# Patient Record
Sex: Male | Born: 1990 | Race: Black or African American | Hispanic: No | Marital: Single | Smoking: Former smoker
Health system: Southern US, Community
[De-identification: ages and names within clinical notes are randomized; demographics above are authoritative.]

## PROBLEM LIST (undated history)

## (undated) DIAGNOSIS — F329 Major depressive disorder, single episode, unspecified: Secondary | ICD-10-CM

## (undated) DIAGNOSIS — F32A Depression, unspecified: Secondary | ICD-10-CM

## (undated) DIAGNOSIS — F1910 Other psychoactive substance abuse, uncomplicated: Secondary | ICD-10-CM

## (undated) DIAGNOSIS — R519 Headache, unspecified: Secondary | ICD-10-CM

## (undated) DIAGNOSIS — F419 Anxiety disorder, unspecified: Secondary | ICD-10-CM

## (undated) DIAGNOSIS — R51 Headache: Secondary | ICD-10-CM

## (undated) DIAGNOSIS — F319 Bipolar disorder, unspecified: Secondary | ICD-10-CM

## (undated) HISTORY — DX: Bipolar disorder, unspecified: F31.9

## (undated) HISTORY — PX: FEMUR FRACTURE SURGERY: SHX633

## (undated) HISTORY — DX: Anxiety disorder, unspecified: F41.9

---
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
## 2008-02-26 ENCOUNTER — Inpatient Hospital Stay: Payer: Self-pay | Admitting: Psychiatry

## 2009-01-26 ENCOUNTER — Inpatient Hospital Stay: Payer: Self-pay | Admitting: Psychiatry

## 2010-10-08 ENCOUNTER — Inpatient Hospital Stay: Payer: Self-pay | Admitting: Psychiatry

## 2011-01-15 ENCOUNTER — Inpatient Hospital Stay: Payer: Self-pay | Admitting: Psychiatry

## 2011-02-18 ENCOUNTER — Emergency Department: Payer: Self-pay | Admitting: Internal Medicine

## 2013-03-06 ENCOUNTER — Emergency Department: Payer: Self-pay | Admitting: Internal Medicine

## 2013-03-06 LAB — DRUG SCREEN, URINE
Amphetamines, Ur Screen: NEGATIVE (ref ?–1000)
Barbiturates, Ur Screen: NEGATIVE (ref ?–200)
Benzodiazepine, Ur Scrn: NEGATIVE (ref ?–200)
Cannabinoid 50 Ng, Ur ~~LOC~~: POSITIVE (ref ?–50)
Cocaine Metabolite,Ur ~~LOC~~: NEGATIVE (ref ?–300)
MDMA (Ecstasy)Ur Screen: NEGATIVE (ref ?–500)
Methadone, Ur Screen: NEGATIVE (ref ?–300)
Opiate, Ur Screen: NEGATIVE (ref ?–300)
Tricyclic, Ur Screen: NEGATIVE (ref ?–1000)

## 2014-11-23 ENCOUNTER — Encounter: Payer: Self-pay | Admitting: Emergency Medicine

## 2014-11-23 ENCOUNTER — Emergency Department
Admission: EM | Admit: 2014-11-23 | Discharge: 2014-11-24 | Disposition: A | Discharge status: Other Acute Inpatient Hospital | Payer: Self-pay | Attending: Emergency Medicine | Admitting: Emergency Medicine

## 2014-11-23 DIAGNOSIS — Z79899 Other long term (current) drug therapy: Secondary | ICD-10-CM | POA: Insufficient documentation

## 2014-11-23 DIAGNOSIS — F1410 Cocaine abuse, uncomplicated: Secondary | ICD-10-CM | POA: Insufficient documentation

## 2014-11-23 DIAGNOSIS — Z87891 Personal history of nicotine dependence: Secondary | ICD-10-CM | POA: Insufficient documentation

## 2014-11-23 DIAGNOSIS — F332 Major depressive disorder, recurrent severe without psychotic features: Secondary | ICD-10-CM

## 2014-11-23 DIAGNOSIS — F1210 Cannabis abuse, uncomplicated: Secondary | ICD-10-CM | POA: Insufficient documentation

## 2014-11-23 DIAGNOSIS — F1510 Other stimulant abuse, uncomplicated: Secondary | ICD-10-CM | POA: Insufficient documentation

## 2014-11-23 LAB — CBC
HEMATOCRIT: 46.9 % (ref 40.0–52.0)
HEMOGLOBIN: 16.1 g/dL (ref 13.0–18.0)
MCH: 31.7 pg (ref 26.0–34.0)
MCHC: 34.3 g/dL (ref 32.0–36.0)
MCV: 92.4 fL (ref 80.0–100.0)
PLATELETS: 267 10*3/uL (ref 150–440)
RBC: 5.07 MIL/uL (ref 4.40–5.90)
RDW: 13.1 % (ref 11.5–14.5)
WBC: 9.6 10*3/uL (ref 3.8–10.6)

## 2014-11-23 LAB — COMPREHENSIVE METABOLIC PANEL
ALT: 14 U/L — ABNORMAL LOW (ref 17–63)
AST: 22 U/L (ref 15–41)
Albumin: 4.4 g/dL (ref 3.5–5.0)
Alkaline Phosphatase: 58 U/L (ref 38–126)
Anion gap: 12 (ref 5–15)
BUN: 15 mg/dL (ref 6–20)
CO2: 26 mmol/L (ref 22–32)
Calcium: 9.1 mg/dL (ref 8.9–10.3)
Chloride: 94 mmol/L — ABNORMAL LOW (ref 101–111)
Creatinine, Ser: 1.3 mg/dL — ABNORMAL HIGH (ref 0.61–1.24)
GFR calc Af Amer: >60 mL/min (ref >60)
GFR calc non Af Amer: >60 mL/min (ref >60)
Glucose, Bld: 113 mg/dL — ABNORMAL HIGH (ref 65–99)
Potassium: 3.9 mmol/L (ref 3.5–5.1)
SODIUM: 132 mmol/L — AB (ref 135–145)
Total Bilirubin: 1.1 mg/dL (ref 0.3–1.2)
Total Protein: 7.5 g/dL (ref 6.5–8.1)

## 2014-11-23 LAB — URINE DRUG SCREEN, QUALITATIVE (ARMC ONLY)
AMPHETAMINES, UR SCREEN: POSITIVE — AB
BENZODIAZEPINE, UR SCRN: NOT DETECTED
Barbiturates, Ur Screen: NOT DETECTED
Cannabinoid 50 Ng, Ur ~~LOC~~: POSITIVE — AB
Cocaine Metabolite,Ur ~~LOC~~: POSITIVE — AB
MDMA (Ecstasy)Ur Screen: NOT DETECTED
METHADONE SCREEN, URINE: NOT DETECTED
OPIATE, UR SCREEN: NOT DETECTED
PHENCYCLIDINE (PCP) UR S: NOT DETECTED
Tricyclic, Ur Screen: NOT DETECTED

## 2014-11-23 LAB — ACETAMINOPHEN LEVEL: Acetaminophen (Tylenol), Serum: <10 ug/mL — ABNORMAL LOW (ref 10–30)

## 2014-11-23 LAB — SALICYLATE LEVEL

## 2014-11-23 LAB — ETHANOL

## 2014-11-23 NOTE — ED Notes (Signed)
Pt brought into ED BHU via sally port accompanie by [employer] Officer and myself. Pt wanded with metal detector for safety by [employer] Officer Dixon. Patient oriented to unit/care area: Pt informed of unit policies and procedures.  Informed that, for their safety, care areas are designed for safety and monitored by security cameras at all times; and visiting hours explained to patient. Patient verbalizes understanding, and verbal contract for safety obtained.Pt shown to their room 3.  Patient assigned to appropriate care area. Patient oriented to unit/care area: Informed that, for their safety, care areas are designed for safety and monitored by security cameras at all times; and visiting hours explained to patient. Patient verbalizes understanding, and verbal contract for safety obtained.  BEHAVIORAL HEALTH ROUNDING Patient sleeping: No. Patient alert and oriented: yes Behavior appropriate: Yes.   Nutrition and fluids offered: Yes  Toileting and hygiene offered: Yes  Sitter present: q15 min observations and security camera monitoring Law enforcement present: Yes [employer]  ENVIRONMENTAL ASSESSMENT Potentially harmful objects out of patient reach: Yes.   Personal belongings secured: Yes.   Patient dressed in hospital provided attire only: Yes.   Plastic bags out of patient reach: Yes.   Patient care equipment removed: Yes.   Equipment and supplies removed: Yes.   Potentially toxic materials out of patient reach: Yes.   Sharps container removed or out of patient reach: Yes.    ED BHU PLACEMENT JUSTIFICATION Is the patient under IVC or is there intent for IVC: Yes.    Is IVC current? Yes Is the patient medically cleared: Yes.   Is there vacancy in the ED BHU: Yes.   Is the population mix appropriate for patient: Yes.   Is the patient awaiting placement in inpatient or outpatient setting: unknown at this time.   Has the patient had a psychiatric consult: No.   Survey of unit performed for  contraband, proper placement and condition of furniture, tampering with fixtures in bathroom, shower, and each patient room: Yes.  ; Findings: none APPEARANCE/BEHAVIOR Cooperative,calm NEURO ASSESSMENT Orientation: A&O x4 Hallucinations: none noted at this time Speech: Normal Gait: normal RESPIRATORY ASSESSMENT Breathing Pattern-regular, no respiratory distress noted CARDIOVASCULAR ASSESSMENT Skin color appropriate for age and race GASTROINTESTINAL ASSESSMENT no GI distress noted EXTREMITIES Moves all extremities, no distress noted PLAN OF CARE Provide calm/safe environment. Vital signs assessed twice daily. ED BHU Assessment once each 12-hour shift. Collaborate with intake RN daily or as condition indicates. Assure the ED provider has rounded once each shift. Provide and encourage hygiene. Provide redirection as needed. Assess for escalating behavior; address immediately and inform ED provider.  Assess family dynamic and appropriateness for visitation as needed: Yes.  Educate the patient/family about BHU procedures/visitation: Yes.

## 2014-11-23 NOTE — BH Assessment (Signed)
Assessment Note  Roger [name] is an 24 y.o. male. Roger [name] reports that the family or the police brought him to the ED.  He states that he does not know why he is here.  He stated "I gave my opinion and they did not like what I was talking about". He went on to say "I'm done".  Roger [name] first denied depression, then stated "I'm definitely depressed. I have constant negative self talk".  He denied symptoms of anxiety.  He denied having auditory or visual hallucinations.  He denied homicidal ideation or intent.  He reports that he has had suicidal ideation, and was thinking about killing himself last night, but he did not have a play.  Roger [name] reports that he uses alcohol, marijuana, cocaine, mushrooms, ecstasy, Molly, and pills (he describes the pills as Xanax, klonipin, Percocet, or whatever pills he can get his hands on). His UDS reports positive for Amphetamines, Cocaine, and Cannabinoids.  His current BAC is <5.   Axis I: Major Depression, Recurrent severe and Substance Abuse Axis II: Deferred Axis III: History reviewed. No pertinent past medical history. Axis IV: other psychosocial or environmental problems and problems with primary support group Axis V: 11-20 some danger of hurting self or others possible OR occasionally fails to maintain minimal personal hygiene OR gross impairment in communication  Past Medical History: History reviewed. No pertinent past medical history.  History reviewed. No pertinent past surgical history.  Family History: History reviewed. No pertinent family history.  Social History:  reports that he has been smoking.  He does not have any smokeless tobacco history on file. He reports that he drinks alcohol. He reports that he uses illicit drugs.  Additional Social History:  Alcohol / Drug Use History of alcohol / drug use?: Yes Longest period of sobriety (when/how long): He reports that he never made an attempt to stop using Negative Consequences of Use:  Personal relationships Substance #1 Name of Substance 1: Alcohol 1 - Age of First Use: 13 1 - Amount (size/oz): a fifth a day 1 - Frequency: daily 1 - Last Use / Amount: 11/22/2014 Substance #2 Name of Substance 2: Marijuana 2 - Age of First Use: 14 2 - Amount (size/oz): "However much I can get" 2 - Frequency: daily 2 - Last Use / Amount: 11/22/2014 Substance #3 Name of Substance 3: Cocaine 3 - Age of First Use: 16 3 - Amount (size/oz): 1 gram 3 - Frequency: daily 3 - Last Use / Amount: 11/21/2014 Substance #4 Name of Substance 4: Ecstacy 4 - Age of First Use: 16 4 - Amount (size/oz): 1 pill 4 - Frequency: daily 4 - Last Use / Amount: 11/22/2014 Substance #5 Name of Substance 5: Mollies 5 - Age of First Use: 24 5 - Amount (size/oz): 1 gram 5 - Frequency: daily 5 - Last Use / Amount: 11/21/2014 Substance #6 Name of Substance 6: Pills (percocets, xanax, klonipin, or whatever pills he can get) 6 - Age of First Use: 16 6 - Amount (size/oz): 1-2 of each pill 6 - Frequency: daily 6 - Last Use / Amount: 11/21/2014 Substance #7 Name of Substance 7: Mushrooms 7 - Age of First Use: 16 7 - Amount (size/oz): 1 mushroom 7 - Frequency: daily/when I can get them 7 - Last Use / Amount: 11/06/2014  CIWA: CIWA-Ar BP: 119/73 mmHg Pulse Rate: (!) 52 COWS:    Allergies: No Known Allergies  Home Medications:  (Not in a hospital admission)  OB/GYN Status:  No LMP for male patient.  General Assessment Data Location of Assessment: [facility] ED TTS Assessment: In system Is this a Tele or Face-to-Face Assessment?: Face-to-Face Is this an Initial Assessment or a Re-assessment for this encounter?: Initial Assessment Marital status: Single Maiden name: n/a Is patient pregnant?: No Pregnancy Status: No Living Arrangements: Parent, Other relatives (Grand-mother) Can pt return to current living arrangement?: Yes Admission Status: Involuntary Is patient capable of signing voluntary admission?:  Yes Referral Source: Psychiatrist Insurance type: None reported  Medical Screening Exam [facility] Walk-in ONLY) Medical Exam completed: Yes  Crisis Care Plan Living Arrangements: Parent, Other relatives (Grand-mother) Name of Psychiatrist: [facility] Name of Therapist: None reported  Education Status Is patient currently in school?: No Current Grade: n/a Highest grade of school patient has completed: 12th Name of school: [employer] person: n/a  Risk to self with the past 6 months Suicidal Ideation: No-Not Currently/Within Last 6 Months Has patient been a risk to self within the past 6 months prior to admission? : Yes Suicidal Intent: No-Not Currently/Within Last 6 Months ("I want to die, I'm ready") Has patient had any suicidal intent within the past 6 months prior to admission? : Yes Is patient at risk for suicide?: Yes Suicidal Plan?: No-Not Currently/Within Last 6 Months Has patient had any suicidal plan within the past 6 months prior to admission? : Yes Access to Means: No (Unknown) What has been your use of drugs/alcohol within the last 12 months?: Daily use of alcohol and drugs Previous Attempts/Gestures: Yes How many times?: 1 Other Self Harm Risks: denied Triggers for Past Attempts: Family contact ("People ask me questions and don't want an honest answer".) Intentional Self Injurious Behavior: None Family Suicide History: Yes (Cousin jumped in front of a train last year) Recent stressful life event(s): Other (Comment) ("Life in general", "Getting out on my own") Persecutory voices/beliefs?: No Depression: Yes Depression Symptoms: Feeling worthless/self pity Substance abuse history and/or treatment for substance abuse?: Yes Suicide prevention information given to non-admitted patients: Not applicable  Risk to Others within the past 6 months Homicidal Ideation: No Does patient have any lifetime risk of violence toward others beyond the six months prior to  admission? : No Thoughts of Harm to Others: No Current Homicidal Intent: No Current Homicidal Plan: No Access to Homicidal Means: No Identified Victim: None reported History of harm to others?: No Assessment of Violence: On admission Violent Behavior Description: None reported Does patient have access to weapons?: No Criminal Charges Pending?: No Does patient have a court date: No Is patient on probation?: No  Psychosis Hallucinations: None noted Delusions: None noted  Mental Status Report Appearance/Hygiene: In scrubs, Unremarkable Eye Contact: Fair Motor Activity: Unremarkable Speech: Soft, Unremarkable Level of Consciousness: Alert Mood: Depressed Affect: Flat Anxiety Level: Minimal Thought Processes: Circumstantial Judgement: Impaired Orientation: Person, Place, Time, Situation Obsessive Compulsive Thoughts/Behaviors: None     ADLScreening [facility] Assessment Services) Patient's cognitive ability adequate to safely complete daily activities?: Yes Patient able to express need for assistance with ADLs?: Yes Independently performs ADLs?: Yes (appropriate for developmental age)  Prior Inpatient Therapy Prior Inpatient Therapy: Yes Prior Therapy Dates: 2011 Prior Therapy Facilty/Provider(s): [facility] Reason for Treatment: Depression  Prior Outpatient Therapy Prior Outpatient Therapy: No (None reported) Prior Therapy Dates: n/a Prior Therapy Facilty/Provider(s): n/a Reason for Treatment: Suicidal ideation  Does patient have an ACCT team?: No Does patient have Intensive In-House Services?  : No Does patient have [facility] services? : No Does patient have P4CC services?: No  ADL Screening (condition at time of admission) Patient's cognitive ability adequate to safely complete daily activities?: Yes Patient able to express need for assistance with ADLs?: Yes Independently performs ADLs?: Yes (appropriate for developmental age)       Abuse/Neglect Assessment  (Assessment to be complete while patient is alone) Physical Abuse: Denies Verbal Abuse: Denies Sexual Abuse: Denies Exploitation of patient/patient's resources: Denies Self-Neglect: Denies Values / Beliefs Cultural Requests During Hospitalization: None Spiritual Requests During Hospitalization: None        Additional Information 1:1 In Past 12 Months?: No CIRT Risk: No Elopement Risk: No Does patient have medical clearance?: Yes     Disposition:  Disposition Initial Assessment Completed for this Encounter: Yes Disposition of Patient: Other dispositions (To be assessed by the psychiatrist)  On Site Evaluation by:   Reviewed with Physician:    Justice Deeds 11/23/2014 10:21 PM

## 2014-11-23 NOTE — ED Notes (Signed)

## 2014-11-23 NOTE — ED Notes (Signed)
Patient to ED via [employer], patient talkative and reporting that he needs to get clean. Reports that he has been drinking and doing drugs. Patient reports that he has had thoughts of hurting himself in past but none today.

## 2014-11-23 NOTE — ED Notes (Signed)
BEHAVIORAL HEALTH ROUNDING Patient sleeping: No. Patient alert and oriented: yes Behavior appropriate: Yes.  ; If no, describe:  Nutrition and fluids offered: Yes  Toileting and hygiene offered: Yes  Sitter present: yes Law enforcement present: Yes  

## 2014-11-23 NOTE — ED Notes (Signed)
Patient denies suicidal thoughts at this time  

## 2014-11-23 NOTE — ED Notes (Signed)
Patient assigned to appropriate care area. Patient oriented to unit/care area: Informed that, for their safety, care areas are designed for safety and monitored by security cameras at all times; and visiting hours explained to patient. Patient verbalizes understanding, and verbal contract for safety obtained. 

## 2014-11-23 NOTE — ED Notes (Signed)
BEHAVIORAL HEALTH ROUNDING Patient sleeping: No. Patient alert and oriented: yes Behavior appropriate: Yes.  ;  Nutrition and fluids offered: Yes  Toileting and hygiene offered: Yes  Sitter present: yes Law enforcement present: Yes  

## 2014-11-23 NOTE — ED Notes (Signed)
ED BHU PLACEMENT JUSTIFICATION Is the patient under IVC or is there intent for IVC: Yes.   Is the patient medically cleared: No. Is there vacancy in the ED BHU: Yes.   Is the population mix appropriate for patient: Yes.   Is the patient awaiting placement in inpatient or outpatient setting: No. Has the patient had a psychiatric consult: No. Survey of unit performed for contraband, proper placement and condition of furniture, tampering with fixtures in bathroom, shower, and each patient room: Yes.  ; Findings: none APPEARANCE/BEHAVIOR calm, cooperative and adequate rapport can be established NEURO ASSESSMENT Orientation: time, place and person Hallucinations: No.None noted (Hallucinations) Speech: Normal Gait: normal RESPIRATORY ASSESSMENT Normal expansion.  Clear to auscultation.  No rales, rhonchi, or wheezing., No chest wall tenderness., No kyphosis or scoliosis. CARDIOVASCULAR ASSESSMENT regular rate and rhythm, S1, S2 normal, no murmur, click, rub or gallop GASTROINTESTINAL ASSESSMENT soft, nontender, BS WNL, no r/g EXTREMITIES normal strength, tone, and muscle mass, no deformities, no erythema, induration, or nodules, no evidence of joint effusion, ROM of all joints is normal, no evidence of joint instability PLAN OF CARE Provide calm/safe environment. Vital signs assessed twice daily. ED BHU Assessment once each 12-hour shift. Collaborate with intake RN daily or as condition indicates. Assure the ED provider has rounded once each shift. Provide and encourage hygiene. Provide redirection as needed. Assess for escalating behavior; address immediately and inform ED provider.  Assess family dynamic and appropriateness for visitation as needed: Yes.  ; If necessary, describe findings: Pt would like family visitation.   Educate the patient/family about BHU procedures/visitation: Yes.  ; If necessary, describe findings: Pt understand the rules and procedures.

## 2014-11-24 ENCOUNTER — Inpatient Hospital Stay
Admit: 2014-11-24 | Discharge: 2014-11-26 | DRG: 885 | Disposition: A | Discharge status: Home / Self Care | Payer: No Typology Code available for payment source | Attending: Psychiatry | Admitting: Psychiatry

## 2014-11-24 DIAGNOSIS — F332 Major depressive disorder, recurrent severe without psychotic features: Secondary | ICD-10-CM | POA: Diagnosis present

## 2014-11-24 DIAGNOSIS — Z720 Tobacco use: Secondary | ICD-10-CM | POA: Diagnosis not present

## 2014-11-24 DIAGNOSIS — Z9119 Patient's noncompliance with other medical treatment and regimen: Secondary | ICD-10-CM | POA: Diagnosis present

## 2014-11-24 DIAGNOSIS — F1320 Sedative, hypnotic or anxiolytic dependence, uncomplicated: Secondary | ICD-10-CM

## 2014-11-24 DIAGNOSIS — F1120 Opioid dependence, uncomplicated: Secondary | ICD-10-CM | POA: Diagnosis present

## 2014-11-24 DIAGNOSIS — Z811 Family history of alcohol abuse and dependence: Secondary | ICD-10-CM

## 2014-11-24 DIAGNOSIS — F1020 Alcohol dependence, uncomplicated: Secondary | ICD-10-CM | POA: Diagnosis present

## 2014-11-24 DIAGNOSIS — G4700 Insomnia, unspecified: Secondary | ICD-10-CM | POA: Diagnosis present

## 2014-11-24 DIAGNOSIS — Z915 Personal history of self-harm: Secondary | ICD-10-CM

## 2014-11-24 DIAGNOSIS — F17200 Nicotine dependence, unspecified, uncomplicated: Secondary | ICD-10-CM

## 2014-11-24 DIAGNOSIS — R45851 Suicidal ideations: Secondary | ICD-10-CM | POA: Diagnosis present

## 2014-11-24 DIAGNOSIS — F1590 Other stimulant use, unspecified, uncomplicated: Secondary | ICD-10-CM

## 2014-11-24 DIAGNOSIS — F1220 Cannabis dependence, uncomplicated: Secondary | ICD-10-CM | POA: Diagnosis present

## 2014-11-24 DIAGNOSIS — F1490 Cocaine use, unspecified, uncomplicated: Secondary | ICD-10-CM | POA: Diagnosis present

## 2014-11-24 DIAGNOSIS — F1690 Hallucinogen use, unspecified, uncomplicated: Secondary | ICD-10-CM

## 2014-11-24 HISTORY — DX: Headache: R51

## 2014-11-24 HISTORY — DX: Headache, unspecified: R51.9

## 2014-11-24 MED ORDER — MAGNESIUM HYDROXIDE 400 MG/5ML PO SUSP: 30.0000 mL | Freq: Every day | ORAL | Status: DC | PRN

## 2014-11-24 MED ORDER — FLUOXETINE HCL 20 MG PO CAPS
20.0000 mg | ORAL_CAPSULE | Freq: Every day | ORAL | Status: DC
  Administered 2014-11-24 – 2014-11-26 (×3): 20 mg via ORAL
  Filled 2014-11-24 (×3): qty 1

## 2014-11-24 MED ORDER — ACETAMINOPHEN 325 MG PO TABS
650.0000 mg | ORAL_TABLET | Freq: Four times a day (QID) | ORAL | Status: DC | PRN
  Administered 2014-11-24 – 2014-11-25 (×3): 650 mg via ORAL
  Filled 2014-11-24 (×3): qty 2

## 2014-11-24 MED ORDER — ALUM & MAG HYDROXIDE-SIMETH 200-200-20 MG/5ML PO SUSP: 30.0000 mL | ORAL | Status: DC | PRN

## 2014-11-24 NOTE — ED Notes (Signed)
BEHAVIORAL HEALTH ROUNDING Patient sleeping: No. Patient alert and oriented: yes Behavior appropriate: Yes.   Nutrition and fluids offered: Yes  Toileting and hygiene offered: Yes  Sitter present: q15 min observations and security camera monitoring Law enforcement present: Yes [employer] 

## 2014-11-24 NOTE — ED Provider Notes (Signed)
-----------------------------------------   11:09 AM on 11/24/2014 -----------------------------------------   Blood pressure 119/73, pulse 52, temperature 98.4 F (36.9 C), temperature source Oral, resp. rate 18, height  (1.676 m), weight 127 lb (57.607 kg), SpO2 100 %.  The patient had no acute events since last update.  Calm and cooperative at this time.  Disposition is pending per Psychiatry/Behavioral Medicine team recommendations.     Sharman Cheek, MD 11/24/14 [phone]

## 2014-11-24 NOTE — ED Notes (Signed)
BEHAVIORAL HEALTH ROUNDING Patient sleeping: Yes.   Patient alert and oriented: yes Behavior appropriate: Yes.  ; If no, describe:  Nutrition and fluids offered: Yes  Toileting and hygiene offered: Yes  Sitter present: yes Law enforcement present: Yes [employer]  

## 2014-11-24 NOTE — ED Notes (Signed)

## 2014-11-24 NOTE — ED Notes (Signed)
BEHAVIORAL HEALTH ROUNDING Patient sleeping: Yes.   Patient alert and oriented: UTA at this time Behavior appropriate: Yes.  ; If no, describe:  Nutrition and fluids offered: Yes  Toileting and hygiene offered: Yes  Sitter present: yes Law enforcement present: Yes [employer] 

## 2014-11-24 NOTE — ED Notes (Signed)
ED BHU PLACEMENT JUSTIFICATION Is the patient under IVC or is there intent for IVC: Yes.   Is the patient medically cleared: Yes.   Is there vacancy in the ED BHU: Yes.   Is the population mix appropriate for patient: Yes.   Is the patient awaiting placement in inpatient or outpatient setting: Yes.   Has the patient had a psychiatric consult: No. Survey of unit performed for contraband, proper placement and condition of furniture, tampering with fixtures in bathroom, shower, and each patient room: Yes.  ; Findings:  APPEARANCE/BEHAVIOR calm, cooperative and adequate rapport can be established NEURO ASSESSMENT Orientation: time, place and person Hallucinations: No.None noted (Hallucinations) Speech: Normal Gait: normal RESPIRATORY ASSESSMENT Normal expansion.  Clear to auscultation.  No rales, rhonchi, or wheezing. CARDIOVASCULAR ASSESSMENT regular rate and rhythm, S1, S2 normal, no murmur, click, rub or gallop GASTROINTESTINAL ASSESSMENT soft, nontender, BS WNL, no r/g EXTREMITIES normal strength, tone, and muscle mass PLAN OF CARE Provide calm/safe environment. Vital signs assessed twice daily. ED BHU Assessment once each 12-hour shift. Collaborate with intake RN daily or as condition indicates. Assure the ED provider has rounded once each shift. Provide and encourage hygiene. Provide redirection as needed. Assess for escalating behavior; address immediately and inform ED provider.  Assess family dynamic and appropriateness for visitation as needed: Yes.  ; If necessary, describe findings:  Educate the patient/family about BHU procedures/visitation: Yes.  ; If necessary, describe findings:  

## 2014-11-24 NOTE — ED Notes (Signed)
BEHAVIORAL HEALTH ROUNDING Patient sleeping: No. Patient alert and oriented: yes Behavior appropriate: Yes.  ; If no, describe:  Nutrition and fluids offered: Yes Toileting and hygiene offered: Yes  Sitter present: yes Law enforcement present: Yes [employer]  

## 2014-11-24 NOTE — ED Notes (Signed)
BEHAVIORAL HEALTH ROUNDING Patient sleeping: Yes.   Patient alert and oriented: not applicable Behavior appropriate: Yes.  ; If no, describe:  Nutrition and fluids offered: Yes  Toileting and hygiene offered: Yes  Sitter present: yes Law enforcement present: Yes [employer] 

## 2014-11-24 NOTE — BHH Counselor (Addendum)
Pt. Has been given information on Adult Unit General Information for Families, and [facility] folder.

## 2014-11-24 NOTE — Consult Note (Signed)
[place] Psychiatry Consult   Reason for Consult:  Consult for this 24 year old man with a history of depression and substance abuse Referring Physician:  Joni Fears Patient Identification: Roger [name] MRN:  [id number] Principal Diagnosis: Severe recurrent major depression without psychotic features Diagnosis:   Patient Active Problem List   Diagnosis Date Noted  . Severe recurrent major depression without psychotic features [F33.2] 11/24/2014  . Alcohol abuse [F10.10] 11/24/2014  . Cocaine abuse [F14.10] 11/24/2014    Total Time spent with patient: 1 hour  Subjective:   Roger [name] is a 24 y.o. male patient admitted with "they just didn't like my answer" also says that he is "tired of it".  HPI:  Information from patient and the chart. He was center on commitment papers from [place] reporting that he was voicing suicidal ideation with intent. The patient tells me that yesterday he was talking to his aunt and told her that he had thought recently about killing himself. He tells me that he thinks about it fairly frequently. He refuses to disclose a specific plan but he has told other interviewers here in the emergency room that he has thought about shooting himself. Patient is rather evasive with me in trying to pin down exactly what his intentions and plans are. He says the biggest stress in his life is that he has a gambling addiction. He claims that he gambles away all of his money every payday and that's what keeps him upset. Also he says he is drinking liquor every day and using cocaine several times a week and marijuana every day. He also goes on with a long list of other drugs he claims he is abusing. He is not currently getting any outpatient psychiatric treatment. Sleep has been poor. Appetite is been stable. No other new physical complaints.  Past psychiatric history: Patient has had psychiatric hospitalizations in the past for depression and suicidal threats and behavior. Says  he had 1 suicide attempt that he came close to following through on with a hanging in 2011 but none since then. He hasn't been following up with outpatient treatment for years. Santiago Glad remember whether any medications were ever helpful to him. Denies that he's been violent to others. Says that he's been in substance abuse treatment but he is also vague about how long ago that was.  Social history: Currently living with his grandmother. 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 Sounds like he has a somewhat conflicted and strained relationship with much of his family.  Medical history: Denies any significant ongoing medical problems  Family history: Does not report any clear family history of mental illness  Current medications none HPI Elements:   Quality:  Depression with suicidal thoughts. Severity:  Potentially severe and life-threatening. Timing:  Ongoing but sound like it's been worse for the past couple months. Duration:  Still present currently. Context:  Substance abuse and gambling addiction and lack of outpatient treatment.  Past Medical History: History reviewed. No pertinent past medical history. History reviewed. No pertinent past surgical history. Family History: History reviewed. No pertinent family history. Social History:  History  Alcohol Use  . Yes     History  Drug Use  . Yes    History   Social History  . Marital Status: Single    Spouse Name: N/A  . Number of Children: N/A  . Years of Education: N/A   Social History Main Topics  . Smoking status: Current Every Day Smoker  .  Smokeless tobacco: Not on file  . Alcohol Use: Yes  . Drug Use: Yes  . Sexual Activity: Not on file   Other Topics Concern  . None   Social History Narrative  . None   Additional Social History:    History of alcohol / drug use?: Yes Longest period of sobriety (when/how long): He reports that he never made an attempt to stop using Negative Consequences of  Use: Personal relationships Name of Substance 1: Alcohol 1 - Age of First Use: 13 1 - Amount (size/oz): a fifth a day 1 - Frequency: daily 1 - Last Use / Amount: 11/22/2014 Name of Substance 2: Marijuana 2 - Age of First Use: 14 2 - Amount (size/oz): "However much I can get" 2 - Frequency: daily 2 - Last Use / Amount: 11/22/2014 Name of Substance 3: Cocaine 3 - Age of First Use: 16 3 - Amount (size/oz): 1 gram 3 - Frequency: daily 3 - Last Use / Amount: 11/21/2014 Name of Substance 4: Ecstacy 4 - Age of First Use: 16 4 - Amount (size/oz): 1 pill 4 - Frequency: daily 4 - Last Use / Amount: 11/22/2014 Name of Substance 5: Mollies 5 - Age of First Use: 50 5 - Amount (size/oz): 1 gram 5 - Frequency: daily 5 - Last Use / Amount: 11/21/2014 Name of Substance 6: Pills (percocets, xanax, klonipin, or whatever pills he can get) 6 - Age of First Use: 16 6 - Amount (size/oz): 1-2 of each pill 6 - Frequency: daily 6 - Last Use / Amount: 11/21/2014         Allergies:  No Known Allergies  Labs:  Results for orders placed or performed during the hospital encounter of 11/23/14 (from the past 48 hour(s))  Comprehensive metabolic panel     Status: Abnormal   Collection Time: 11/23/14  6:19 PM  Result Value Ref Range   Sodium 132 (L) 135 - 145 mmol/L   Potassium 3.9 3.5 - 5.1 mmol/L   Chloride 94 (L) 101 - 111 mmol/L   CO2 26 22 - 32 mmol/L   Glucose, Bld 113 (H) 65 - 99 mg/dL   BUN 15 6 - 20 mg/dL   Creatinine, Ser 1.30 (H) 0.61 - 1.24 mg/dL   Calcium 9.1 8.9 - 10.3 mg/dL   Total Protein 7.5 6.5 - 8.1 g/dL   Albumin 4.4 3.5 - 5.0 g/dL   AST 22 15 - 41 U/L   ALT 14 (L) 17 - 63 U/L   Alkaline Phosphatase 58 38 - 126 U/L   Total Bilirubin 1.1 0.3 - 1.2 mg/dL   GFR calc non Af Amer >60 >60 mL/min   GFR calc Af Amer >60 >60 mL/min    Comment: (NOTE) The eGFR has been calculated using the CKD EPI equation. This calculation has not been validated in all clinical situations. eGFR's  persistently <60 mL/min signify possible Chronic Kidney Disease.    Anion gap 12 5 - 15  Ethanol (ETOH)     Status: None   Collection Time: 11/23/14  6:19 PM  Result Value Ref Range   Alcohol, Ethyl (B) <5 <5 mg/dL    Comment:        LOWEST DETECTABLE LIMIT FOR SERUM ALCOHOL IS 5 mg/dL FOR MEDICAL PURPOSES ONLY   Salicylate level     Status: None   Collection Time: 11/23/14  6:19 PM  Result Value Ref Range   Salicylate Lvl <4.8 2.8 - 30.0 mg/dL  Acetaminophen level  Status: Abnormal   Collection Time: 11/23/14  6:19 PM  Result Value Ref Range   Acetaminophen (Tylenol), Serum <10 (L) 10 - 30 ug/mL    Comment:        THERAPEUTIC CONCENTRATIONS VARY SIGNIFICANTLY. A RANGE OF 10-30 ug/mL MAY BE AN EFFECTIVE CONCENTRATION FOR MANY PATIENTS. HOWEVER, SOME ARE BEST TREATED AT CONCENTRATIONS OUTSIDE THIS RANGE. ACETAMINOPHEN CONCENTRATIONS >150 ug/mL AT 4 HOURS AFTER INGESTION AND >50 ug/mL AT 12 HOURS AFTER INGESTION ARE OFTEN ASSOCIATED WITH TOXIC REACTIONS.   CBC     Status: None   Collection Time: 11/23/14  6:19 PM  Result Value Ref Range   WBC 9.6 3.8 - 10.6 K/uL   RBC 5.07 4.40 - 5.90 MIL/uL   Hemoglobin 16.1 13.0 - 18.0 g/dL   HCT 46.9 40.0 - 52.0 %   MCV 92.4 80.0 - 100.0 fL   MCH 31.7 26.0 - 34.0 pg   MCHC 34.3 32.0 - 36.0 g/dL   RDW 13.1 11.5 - 14.5 %   Platelets 267 150 - 440 K/uL  Urine Drug Screen, Qualitative ([facility] only)     Status: Abnormal   Collection Time: 11/23/14  6:19 PM  Result Value Ref Range   Tricyclic, Ur Screen NONE DETECTED NONE DETECTED   Amphetamines, Ur Screen POSITIVE (A) NONE DETECTED   MDMA (Ecstasy)Ur Screen NONE DETECTED NONE DETECTED   Cocaine Metabolite,Ur [place] POSITIVE (A) NONE DETECTED   Opiate, Ur Screen NONE DETECTED NONE DETECTED   Phencyclidine (PCP) Ur S NONE DETECTED NONE DETECTED   Cannabinoid 50 Ng, Ur [place] POSITIVE (A) NONE DETECTED   Barbiturates, Ur Screen NONE DETECTED NONE DETECTED   Benzodiazepine, Ur Scrn NONE  DETECTED NONE DETECTED   Methadone Scn, Ur NONE DETECTED NONE DETECTED    Comment: (NOTE) 201  Tricyclics, urine               Cutoff 1000 ng/mL 200  Amphetamines, urine             Cutoff 1000 ng/mL 300  MDMA (Ecstasy), urine           Cutoff 500 ng/mL 400  Cocaine Metabolite, urine       Cutoff 300 ng/mL 500  Opiate, urine                   Cutoff 300 ng/mL 600  Phencyclidine (PCP), urine      Cutoff 25 ng/mL 700  Cannabinoid, urine              Cutoff 50 ng/mL 800  Barbiturates, urine             Cutoff 200 ng/mL 900  Benzodiazepine, urine           Cutoff 200 ng/mL 1000 Methadone, urine                Cutoff 300 ng/mL 1100 1200 The urine drug screen provides only a preliminary, unconfirmed 1300 analytical test result and should not be used for non-medical 1400 purposes. Clinical consideration and professional judgment should 1500 be applied to any positive drug screen result due to possible 1600 interfering substances. A more specific alternate chemical method 1700 must be used in order to obtain a confirmed analytical result.  1800 Gas chromato graphy / mass spectrometry (GC/MS) is the preferred 1900 confirmatory method.     Vitals: Blood pressure 100/58, pulse 54, temperature 98.5 F (36.9 C), temperature source Oral, resp. rate 16, height $RemoveBe'5\' 6"'TkEkPYLYO$  (1.676 m), weight 57.607 kg (  127 lb), SpO2 100 %.  Risk to Self: Suicidal Ideation: No-Not Currently/Within Last 6 Months Suicidal Intent: No-Not Currently/Within Last 6 Months ("I want to die, I'm ready") Is patient at risk for suicide?: Yes Suicidal Plan?: No-Not Currently/Within Last 6 Months Access to Means: No (Unknown) What has been your use of drugs/alcohol within the last 12 months?: Daily use of alcohol and drugs How many times?: 1 Other Self Harm Risks: denied Triggers for Past Attempts: Family contact ("People ask me questions and don't want an honest answer".) Intentional Self Injurious Behavior: None Risk to Others:  Homicidal Ideation: No Thoughts of Harm to Others: No Current Homicidal Intent: No Current Homicidal Plan: No Access to Homicidal Means: No Identified Victim: None reported History of harm to others?: No Assessment of Violence: On admission Violent Behavior Description: None reported Does patient have access to weapons?: No Criminal Charges Pending?: No Does patient have a court date: No Prior Inpatient Therapy: Prior Inpatient Therapy: Yes Prior Therapy Dates: 2011 Prior Therapy Facilty/Provider(s): [facility] Reason for Treatment: Depression Prior Outpatient Therapy: Prior Outpatient Therapy: No (None reported) Prior Therapy Dates: n/a Prior Therapy Facilty/Provider(s): n/a Reason for Treatment: Suicidal ideation  Does patient have an ACCT team?: No Does patient have Intensive In-House Services?  : No Does patient have [facility] services? : No Does patient have P4[facility] services?: No  No current facility-administered medications for this encounter.   No current outpatient prescriptions on file.    Musculoskeletal: Strength & Muscle Tone: within normal limits Gait & Station: normal Patient leans: N/A  Psychiatric Specialty Exam: Physical Exam  Constitutional: He appears well-developed and well-nourished.  HENT:  Head: Normocephalic and atraumatic.  Eyes: Conjunctivae are normal. Pupils are equal, round, and reactive to light.  Neck: Normal range of motion.  Cardiovascular: Normal heart sounds.   Respiratory: Effort normal.  GI: Soft.  Musculoskeletal: Normal range of motion.  Neurological: He is alert.  Skin: Skin is warm and dry.  Psychiatric: His speech is delayed. He is slowed. Cognition and memory are normal. He expresses impulsivity. He exhibits a depressed mood. He expresses suicidal ideation.    Review of Systems  Constitutional: Negative.   HENT: Negative.   Eyes: Negative.   Respiratory: Negative.   Cardiovascular: Negative.   Gastrointestinal: Negative.    Musculoskeletal: Negative.   Skin: Negative.   Neurological: Negative.   Psychiatric/Behavioral: Positive for depression, suicidal ideas and substance abuse. Negative for hallucinations. The patient is nervous/anxious and has insomnia.     Blood pressure 100/58, pulse 54, temperature 98.5 F (36.9 C), temperature source Oral, resp. rate 16, height $RemoveBe'5\' 6"'RcLqDBGaj$  (1.676 m), weight 57.607 kg (127 lb), SpO2 100 %.Body mass index is 20.51 kg/(m^2).  General Appearance: Guarded  Eye Contact::  Minimal  Speech:  Normal Rate  Volume:  Decreased  Mood:  Depressed  Affect:  Depressed  Thought Process:  Tangential  Orientation:  Full (Time, Place, and Person)  Thought Content:  Negative  Suicidal Thoughts:  Yes.  with intent/plan  Homicidal Thoughts:  No  Memory:  Immediate;   Good Recent;   Fair Remote;   Fair  Judgement:  Impaired  Insight:  Lacking  Psychomotor Activity:  Decreased  Concentration:  Fair  Recall:  Poor  Fund of Knowledge:Fair  Language: Fair  Akathisia:  No  Handed:  Right  AIMS (if indicated):     Assets:  Communication Skills Desire for Improvement Housing Physical Health  ADL's:  Intact  Cognition: WNL  Sleep:  Medical Decision Making: Review of Psycho-Social Stressors (1), Review or order clinical lab tests (1), Established Problem, Worsening (2), Review or order medicine tests (1) and Review of New Medication or Change in Dosage (2)  Treatment Plan Summary: Daily contact with patient to assess and evaluate symptoms and progress in treatment, Medication management and Plan Patient really can't calm up with anything like a "contract for safety" and is very vague and evasive about his suicidal thoughts. Under the circumstances with his past history I suggest we admit him to the hospital. He does have a drug screen positive for amphetamine's cocaine and marijuana although his alcohol level was negative. He doesn't appear to be having any overt signs of alcohol  withdrawal. Patient will be restarted on antidepressive medication and put on 15 minute checks in primary treatment team can work on referral to appropriate outpatient treatment once he is stable.  Plan:  Recommend psychiatric Inpatient admission when medically cleared. Supportive therapy provided about ongoing stressors. Disposition: Admit to psychiatry as noted above  Alethia Berthold 11/24/2014 4:05 PM

## 2014-11-24 NOTE — ED Notes (Signed)
BEHAVIORAL HEALTH ROUNDING Patient sleeping: Yes.   Patient alert and oriented: not applicable Behavior appropriate: Yes.    Nutrition and fluids offered: No Toileting and hygiene offered: No Sitter present: q15 minute observations and security camera monitoring Law enforcement present: Yes [employer] 

## 2014-11-24 NOTE — ED Notes (Signed)
Report received from [facility]. Pt. Alert and oriented in no distress denies SI, HI, AVH and pain.  Pt. Instructed to come to me with problems or concerns.Will continue to monitor for safety via security cameras and Q 15 minute checks.

## 2014-11-24 NOTE — Tx Team (Signed)
Initial Interdisciplinary Treatment Plan   PATIENT STRESSORS: Marital or family conflict Substance abuse   PATIENT STRENGTHS: Ability for insight Capable of independent living Communication skills   PROBLEM LIST: Problem List/Patient Goals Date to be addressed Date deferred Reason deferred Estimated date of resolution  depression 11/24/2014     Substance abuse 11/24/2014                                                DISCHARGE CRITERIA:  Ability to meet basic life and health needs Adequate post-discharge living arrangements Improved stabilization in mood, thinking, and/or behavior Verbal commitment to aftercare and medication compliance  PRELIMINARY DISCHARGE PLAN: Outpatient therapy Placement in alternative living arrangements  PATIENT/FAMIILY INVOLVEMENT: This treatment plan has been presented to and reviewed with the patient, GARON MELANDER, and/or family member.  The patient and family have been given the opportunity to ask questions and make suggestions.  Foster Simpson 11/24/2014, 10:58 PM

## 2014-11-24 NOTE — ED Provider Notes (Signed)
-----------------------------------------   5:38 PM on 11/24/2014 -----------------------------------------   Blood pressure 100/58, pulse 54, temperature 98.5 F (36.9 C), temperature source Oral, resp. rate 16, height  (1.676 m), weight 127 lb (57.607 kg), SpO2 100 %.  Discussed with Dr. Toni Amend. Patient being admitted to Dr. Toni Amend psychiatry service.     Sharyn Creamer, MD 11/24/14 [phone]

## 2014-11-24 NOTE — ED Notes (Signed)
Pt. Noted in room. No complaints or concerns voiced. No distress or abnormal behavior noted. Will continue to monitor with security cameras. Q 15 minute rounds continue. Sandwich and soft drink given. 

## 2014-11-25 ENCOUNTER — Encounter: Payer: Self-pay | Admitting: Psychiatry

## 2014-11-25 DIAGNOSIS — F1320 Sedative, hypnotic or anxiolytic dependence, uncomplicated: Secondary | ICD-10-CM

## 2014-11-25 DIAGNOSIS — F1590 Other stimulant use, unspecified, uncomplicated: Secondary | ICD-10-CM

## 2014-11-25 DIAGNOSIS — F1220 Cannabis dependence, uncomplicated: Secondary | ICD-10-CM

## 2014-11-25 DIAGNOSIS — F1690 Hallucinogen use, unspecified, uncomplicated: Secondary | ICD-10-CM

## 2014-11-25 DIAGNOSIS — F17200 Nicotine dependence, unspecified, uncomplicated: Secondary | ICD-10-CM

## 2014-11-25 DIAGNOSIS — F1120 Opioid dependence, uncomplicated: Secondary | ICD-10-CM

## 2014-11-25 DIAGNOSIS — F332 Major depressive disorder, recurrent severe without psychotic features: Principal | ICD-10-CM

## 2014-11-25 DIAGNOSIS — F1020 Alcohol dependence, uncomplicated: Secondary | ICD-10-CM

## 2014-11-25 LAB — BASIC METABOLIC PANEL
Anion gap: 10 (ref 5–15)
BUN: 15 mg/dL (ref 6–20)
CALCIUM: 9.3 mg/dL (ref 8.9–10.3)
CO2: 29 mmol/L (ref 22–32)
CREATININE: 1.2 mg/dL (ref 0.61–1.24)
Chloride: 97 mmol/L — ABNORMAL LOW (ref 101–111)
GFR calc Af Amer: >60 mL/min (ref >60)
Glucose, Bld: 102 mg/dL — ABNORMAL HIGH (ref 65–99)
Potassium: 4.3 mmol/L (ref 3.5–5.1)
SODIUM: 136 mmol/L (ref 135–145)

## 2014-11-25 LAB — VITAMIN B12: Vitamin B-12: 228 pg/mL (ref 180–914)

## 2014-11-25 LAB — TSH: TSH: 2.269 u[IU]/mL (ref 0.350–4.500)

## 2014-11-25 MED ORDER — IBUPROFEN 600 MG PO TABS
600.0000 mg | ORAL_TABLET | Freq: Four times a day (QID) | ORAL | Status: DC | PRN
  Administered 2014-11-26: 600 mg via ORAL
  Filled 2014-11-25: qty 1

## 2014-11-25 MED ORDER — FLUOXETINE HCL 20 MG PO CAPS: 20.0000 mg | ORAL_CAPSULE | Freq: Every day | ORAL | Status: DC

## 2014-11-25 MED ORDER — NICOTINE 21 MG/24HR TD PT24
21.0000 mg | MEDICATED_PATCH | Freq: Every day | TRANSDERMAL | Status: DC
  Administered 2014-11-25 – 2014-11-26 (×2): 21 mg via TRANSDERMAL
  Filled 2014-11-25 (×2): qty 1

## 2014-11-25 MED ORDER — TRAZODONE HCL 100 MG PO TABS: 100.0000 mg | ORAL_TABLET | Freq: Every evening | ORAL | Status: DC | PRN

## 2014-11-25 MED ORDER — TRAZODONE HCL 100 MG PO TABS
100.0000 mg | ORAL_TABLET | Freq: Every evening | ORAL | Status: DC | PRN
  Filled 2014-11-25: qty 1

## 2014-11-25 MED ORDER — NICOTINE 10 MG IN INHA: 1.0000 | RESPIRATORY_TRACT | Status: DC | PRN

## 2014-11-25 MED ORDER — LORAZEPAM 2 MG PO TABS
2.0000 mg | ORAL_TABLET | Freq: Three times a day (TID) | ORAL | Status: DC | PRN
  Filled 2014-11-25: qty 1

## 2014-11-25 NOTE — [facility] Suicide Risk Assessment (Signed)
[facility] INPATIENT:  Family/Significant Other Suicide Prevention Education  Suicide Prevention Education:  Education Completed; Ronna Polio (grandmother) [phone] has been identified by the patient as the family member/significant other with whom the patient will be residing, and identified as the person(s) who will aid the patient in the event of a mental health crisis (suicidal ideations/suicide attempt).  With written consent from the patient, the family member/significant other has been provided the following suicide prevention education, prior to the and/or following the discharge of the patient.  The suicide prevention education provided includes the following:  Suicide risk factors  Suicide prevention and interventions  National Suicide Hotline telephone number  [facility] assessment telephone number  [facility] Emergency Assistance 911  [facility] and/or Residential Mobile Crisis Unit telephone number  Request made of family/significant other to:  Remove weapons (e.g., guns, rifles, knives), all items previously/currently identified as safety concern.    Remove drugs/medications (over-the-counter, prescriptions, illicit drugs), all items previously/currently identified as a safety concern.  The family member/significant other verbalizes understanding of the suicide prevention education information provided.  The family member/significant other agrees to remove the items of safety concern listed above.  Lulu Riding, MSW, LCSWA 11/25/2014, 4:16 PM

## 2014-11-25 NOTE — Progress Notes (Signed)
Visible in milieu.  No inappropriate behavior noted.  Safety maintained.
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
## 2014-11-25 NOTE — Progress Notes (Signed)
Recreation Therapy Notes  Date: 08.10.16 Time: 3:00 pm Location: Craft Room  Group Topic: Self-esteem  Goal Area(s) Addresses:  Patient will write down at least one positive trait about self.  Behavioral Response: Attentive, Interactive  Intervention: I Am  Activity: Patients were given a worksheet with the letter I on it and instructed to list as many positive traits as they could inside the I.   Education: LRT educated patients on ways they can increase their self-esteem.  Education Outcome: Acknowledges education/In group clarification offered  Clinical Observations/Feedback: Patient filled approximately 75% of worksheet. Patient contributed to group discussion by stating it was difficult to think of positive traits about himself and it felt good to see positive traits about him listed.  Jacquelynn Cree, LRT/CTRS 11/25/2014 4:29 PM

## 2014-11-25 NOTE — [facility] Group Notes (Signed)
[facility] Group Notes:  (Nursing/MHT/Case Management/Adjunct)  Date:  11/25/2014  Time:  1:46 PM  Type of Therapy:  Psychoeducational Skills  Participation Level:  Active  Participation Quality:  Appropriate, Attentive and Sharing  Affect:  Appropriate  Cognitive:  Appropriate  Insight:  Appropriate  Engagement in Group:  Engaged  Modes of Intervention:  Discussion, Education and Support  Summary of Progress/Problems:  Roger [name] 11/25/2014, 1:46 PM

## 2014-11-25 NOTE — [facility] Counselor (Signed)
Adult Comprehensive Assessment  Patient ID: Roger [name], male   DOB: 08-22-1990, 24 y.o.   MRN: [id number]  Information Source: Information source: Patient  Current Stressors:  Substance abuse: alcohol  Living/Environment/Situation:  Living Arrangements: Other relatives, Parent  Family History:  Does patient have children?: No  Childhood History:  By whom was/is the patient raised?: Father, Grandparents Patient's description of current relationship with people who raised him/her: good but does not know his father Does patient have siblings?: No  Education:  Highest grade of school patient has completed: 12th grade Currently a student?: No Learning disability?: No  Employment/Work Situation:   Employment situation: Employed Has patient ever been in the [employer]?: No Has patient ever served in Buyer, retail?: No  Financial Resources:   Surveyor, quantity resources: Income from employment Does patient have a Lawyer or guardian?: No  Alcohol/Substance Abuse:      Social Support System:   Conservation officer, nature Support System: Production assistant, radio System: family and community mental health  Leisure/Recreation:      Strengths/Needs:      Discharge Plan:   Does patient have access to transportation?: Yes Will patient be returning to same living situation after discharge?: Yes Currently receiving community mental health services: No If no, would patient like referral for services when discharged?: Yes (What [place] Does patient have financial barriers related to discharge medications?: Yes Patient description of barriers related to discharge medications: no insureance and is given Med Manangeetn Appplication  Summary/Recommendations:  Patient is a single 24yo AA male admitted for SI and substance use. Patient lives with his grandmother and father recently moved in. Patient is looking for substance use treatment and is referred to [facility] for SAOP and  [facility] for assistance with obtaining medicaitons. Patient is employed and will be given resources for long term treatment that may take time to get admitted and will need to be placed on a wait list. Patient is encouraged to participate in medication management, group therapy and therapeutic milieu.    Lulu Riding., MSW, Theresia Majors  11/25/2014

## 2014-11-25 NOTE — Progress Notes (Signed)
Recreation Therapy Notes  INPATIENT RECREATION THERAPY ASSESSMENT  Patient Details Name: ARLESTER KEEHAN MRN: [id number] DOB: 29-Jul-1990 Today's Date: 11/25/2014  Patient Stressors: Work, Other (Comment) (Gambling)  Coping Skills:   Isolate, Substance Abuse, Avoidance, Exercise, Art/Dance, Talking, Music, Sports, Other (Comment) Librarian, academic)  Personal Challenges: Communication, Decision-Making, Expressing Yourself, Problem-Solving, Self-Esteem/Confidence, Stress Management, Substance Abuse, Trusting Others  Leisure Interests (2+):  Nature - Therapist, music, Individual - Other (Comment) (Being outside)  Awareness of Community Resources:  Yes  Community Resources:  Park, Other (Comment) [facility])  Current Use: Yes  If no, Barriers?:    Patient Strengths:  Easy to get along with, not jealous  Patient Identified Areas of Improvement:  Social skills  Current Recreation Participation:  Fishing  Patient Goal for Hospitalization:  To get out  [place] of Residence:  [place] of Residence:  [place]   Current SI (including self-harm):  No  Current HI:  No  Consent to Intern Participation: N/A   Jacquelynn Cree, LRT/CTRS 11/25/2014, 2:25 PM

## 2014-11-25 NOTE — [facility] Group Notes (Signed)
[facility] LCSW Aftercare Discharge Planning Group Note   11/25/2014 10:56 AM  Participation Quality:  Patient particiapted in group appropriately sharing his SMART goal is to "see a doctor, interact with people and share my thoughts to get an idea of how I am thinking".   Mood/Affect:  Appropriate    Thoughts of Suicide:  No Will you contract for safety?   Yes  Current AVH:  No  Plan for Discharge/Comments:  CSW will assess  Transportation Means: CSW will assess  Supports: CSW will assess  Lulu Riding, MSW, LCSGrove City Surgery Center LLCWA

## 2014-11-25 NOTE — [facility] Suicide Risk Assessment (Signed)
[name] Admission Suicide Risk Assessment   Nursing information obtained from:    Demographic factors:    Current Mental Status:    Loss Factors:    Historical Factors:    Risk Reduction Factors:    Total Time spent with patient: 1 hour Principal Problem: Severe recurrent major depression without psychotic features Diagnosis:   Patient Active Problem List   Diagnosis Date Noted  . Tobacco use disorder [Z72.0] 11/25/2014  . Alcohol use disorder, severe, dependence [F10.20] 11/25/2014  . Cannabis use disorder, severe, dependence [F12.20] 11/25/2014  . 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 Opioid use disorder, moderate, dependence [F11.20] 11/25/2014  . 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 Severe recurrent major depression without psychotic features [F33.2] 11/24/2014     Continued Clinical Symptoms:  Alcohol Use Disorder Identification Test Final Score (AUDIT): 14 The "Alcohol Use Disorders Identification Test", Guidelines for Use in Primary Care, Second Edition.  World Science writer [facility]). Score between 0-7:  no or low risk or alcohol related problems. Score between 8-15:  moderate risk of alcohol related problems. Score between 16-19:  high risk of alcohol related problems. Score 20 or above:  warrants further diagnostic evaluation for alcohol dependence and treatment.   CLINICAL FACTORS:   Depression:   Comorbid alcohol abuse/dependence Impulsivity Alcohol/Substance Abuse/Dependencies More than one psychiatric diagnosis Previous Psychiatric Diagnoses and Treatments   Psychiatric Specialty Exam: Physical Exam  ROS    COGNITIVE FEATURES THAT CONTRIBUTE TO RISK:  Closed-mindedness    SUICIDE RISK:   Moderate:  Frequent suicidal ideation with limited intensity, and duration, some specificity in terms of plans, no associated intent, good  self-control, limited dysphoria/symptomatology, some risk factors present, and identifiable protective factors, including available and accessible social support.  PLAN OF CARE: admit to [facility]  Medical Decision Making:  Established Problem, Worsening (2)  I certify that inpatient services furnished can reasonably be expected to improve the patient's condition.   Roger [name] 11/25/2014, 2:05 PM
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
## 2014-11-25 NOTE — Plan of Care (Signed)
Problem: Spiritual Needs Goal: Ability to function at adequate level Outcome: Progressing Pleasant and cooperative.  Interacting appropriately on the unit.  No insight as to why he is here. Feels that should not be here.

## 2014-11-25 NOTE — [facility] Group Notes (Addendum)
[facility] LCSW Group Therapy  11/25/2014 2:56 PM  Type of Therapy:  Group Therapy  Participation Level:  Active  Participation Quality:  Attentive  Affect:  Appropriate  Cognitive:  Alert  Insight:  Limited  Engagement in Therapy:  Limited  Modes of Intervention:  Discussion, Education, Socialization and Support  Summary of Progress/Problems:Emotional Regulation: Patients will identify both negative and positive emotions. They will discuss emotions they have difficulty regulating and how they impact their lives. Patients will be asked to identify healthy coping skills to combat unhealthy reactions to negative emotions.   Roger [name] attended group and stayed the entire time. He identified anger as an emotion he has difficulties regulating. When he becomes angry he tends to destroy property and throw objects. He states small problems make him very angry. When he becomes angry he tries to meditate, walking or exercising as a way to calm down.   Roger [name] L Enora Trillo MSW, LCSWA  11/25/2014, 2:56 PM

## 2014-11-26 NOTE — Plan of Care (Signed)
Problem: [facility] Participation in Recreation Therapeutic Interventions Goal: STG-Patient will demonstrate improved self esteem by identif STG: Self-Esteem - Within 3 treatment sessions, patient will verbalize at least 5 positive affirmation statements in one treatment session to increase self-esteem post d/c.  Outcome: Completed/Met Date Met:  11/26/14 Treatment Session 1; Completed 1 out of 1: At approximately 8:55 am, LRT met with patient in craft room. Patient verbalized 5 positive affirmation statements. Patient reported it felt "good". LRT encouraged patient to continue saying positive affirmation statements.   Leonette Monarch, LRT/CTRS 08.11.16 12:22 pm Goal: STG-Patient will identify at least five coping skills for ** STG: Coping Skills - Within 3 treatment sessions, patient will verbalize at least 5 coping skills for substance abuse in one treatment session to decrease substance abuse post d/c.  Outcome: Completed/Met Date Met:  11/26/14 Treatment Session 1; Completed 1 out of 1: At approximately 8:55 am, LRT met with patient in craft room. Patient verbalized 5 coping skills for substance abuse. LRT educated patient on leisure and why it is important to implement into his schedule. LRT provided patient with blank schedules to help him plan his day and try to avoid using substances. LRT educated patient on healthy support systems.  Leonette Monarch, LRT/CTRS 08.11.16 12:24 pm Goal: STG-Patient will demonstrate improved communication skills b STG: Communication - Within 3 treatment sessions, patient will participate in communication activity in one treatment session to increase healthy communication skills post d/c.  Outcome: Completed/Met Date Met:  11/26/14 Treatment Session 1; Completed 1 out of 1: At approximately 8:55 am, LRT met with patient in craft room. Patient was instructed to take as much toilet paper as he would use in 6 hours. For each square, patient was instructed to give a  fun fact about himself. Patient able to give a fun fact for each square. LRT educated patient on communication and trust. LRT and patient did role playing to help patient feel better about his communication skills.  Leonette Monarch, LRT/CTRS 08.11.16 12:27 pm Goal: STG-Other Recreation Therapy Goal (Specify) STG: Stress Management - Within 3 treatment sessions, patient will verbalize understanding of the stress management techniques in one treatment session to increase stress management skills post d/c.  Outcome: Completed/Met Date Met:  11/26/14 Treatment Session 1; Completed 1 out of 1: At approximately 8:55 am, LRT met with patient in craft room. LRT educated and provided patient with handouts on stress management techniques. Patient verbalized understanding. LRT encouraged patient to read over and practice the stress management techniques.  Leonette Monarch, LRT/CTRS 08.11.16 12:30 pm

## 2014-11-26 NOTE — [facility] Group Notes (Signed)
[facility] LCSW Group Therapy  11/26/2014 12:29 PM  Type of Therapy: Group Therapy  Participation Level: Did Not Attend  Modes of Intervention: Discussion, Education, Socialization and Support  Summary of Progress/Problems:Balance in life: Patients will discuss the concept of balance and how it looks and feels to be unbalanced. Pt will identify areas in their life that is unbalanced and ways to become more balanced.    Jeff Mccallum L Timmey Lamba MSW, LCSWA  11/26/2014, 12:29 PM 
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
## 2014-11-26 NOTE — Progress Notes (Signed)
AVS H&P Discharge Summary faxed to [facility] for hospital follow-up °

## 2014-11-26 NOTE — [facility] Group Notes (Signed)
[facility] Group Notes:  (Nursing/MHT/Case Management/Adjunct)  Date:  11/26/2014  Time:  8:58 AM  Type of Therapy:  Community Meeting   Participation Level:  Did Not Attend  Summary of Progress/Problems:  Deann Mclaine De'Chelle Waynette Towers 11/26/2014, 8:58 AM

## 2014-11-26 NOTE — [facility] Suicide Risk Assessment (Signed)
[facility] Discharge Suicide Risk Assessment   Demographic Factors:  Male and Adolescent or young adult  Total Time spent with patient: 30 minutes    Psychiatric Specialty Exam: Physical Exam  ROS                                                         Have you used any form of tobacco in the last 30 days? (Cigarettes, Smokeless Tobacco, Cigars, and/or Pipes): Yes  Has this patient used any form of tobacco in the last 30 days? (Cigarettes, Smokeless Tobacco, Cigars, and/or Pipes) Yes, A prescription for an FDA-approved tobacco cessation medication was offered at discharge and the patient refused  Mental Status Per Nursing Assessment::   On Admission:     Current Mental Status by Physician: denies SI, HI or A/VH.  Hopeful, future oriented.  Calm and cooperative  Loss Factors: Financial problems/change in socioeconomic status  Historical Factors: Prior suicide attempts  Risk Reduction Factors:   Sense of responsibility to family, Employed and Positive social support  Continued Clinical Symptoms:  Depression:   Comorbid alcohol abuse/dependence Impulsivity Alcohol/Substance Abuse/Dependencies Previous Psychiatric Diagnoses and Treatments  Cognitive Features That Contribute To Risk:  Closed-mindedness    Suicide Risk:  Minimal: No identifiable suicidal ideation.  Patients presenting with no risk factors but with morbid ruminations; may be classified as minimal risk based on the severity of the depressive symptoms  Principal Problem: Severe recurrent major depression without psychotic features Discharge Diagnoses:  Patient Active Problem List   Diagnosis Date Noted  . Tobacco use disorder [Z72.0] 11/25/2014  . Alcohol use disorder, severe, dependence [F10.20] 11/25/2014  . Cannabis use disorder, severe, dependence [F12.20] 11/25/2014  . 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 Opioid use disorder, moderate, dependence [F11.20] 11/25/2014  . 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 Severe recurrent major depression without psychotic features [F33.2] 11/24/2014    Follow-up Information    Follow up with [facility].   Why:  For follow-up care   Contact information:   [address]  [place], [place] Ph [phone] Fax [phone]      Is patient on multiple antipsychotic therapies at discharge:  No   Has Patient had three or more failed trials of antipsychotic monotherapy by history:  No  Recommended Plan for Multiple Antipsychotic Therapies: NA    Jimmy Footman 11/26/2014, 7:33 AM

## 2014-11-26 NOTE — Progress Notes (Signed)
  [facility] Adult Case Management Discharge Plan :  Will you be returning to the same living situation after discharge:  Yes,  home with grandmother At discharge, do you have transportation home?: Yes,  grandmother will pick up Do you have the ability to pay for your medications: No. Patient provided Medication Management application and referred  Release of information consent forms completed and in the chart;  Patient's signature needed at discharge.  Patient to Follow up at: Follow-up Information    Follow up with [facility]. Go on 12/02/2014.   Why:  For follow-up care Wednesday 12/02/14 at 7:00am for SAOP   Contact information:   [address]  [place], [place] Ph [phone] Fax [phone]      Patient denies SI/HI: Yes,  patient denies SI/HI    Safety Planning and Suicide Prevention discussed: Yes,  SPE discussed with patient and his grandmother  Have you used any form of tobacco in the last 30 days? (Cigarettes, Smokeless Tobacco, Cigars, and/or Pipes): Yes  Has patient been referred to the Quitline?: Patient refused referral  Lulu Riding, MSW, LCSWA 11/26/2014, 9:58 AM

## 2014-11-26 NOTE — Progress Notes (Signed)
Pleasant and cooperative.  Verbalizes that he is ready to go so he can get back to work.  Further states that his plan is to stop using drugs, they just have to go.  Denies SI, depression or any type of hallucinations.   Discharge instructions given, verbalized understanding. Prescriptions and seven day supply of medications given.  Personal belongings returned.  Escorted off unit by staff member to meet family to travel home.

## 2014-11-26 NOTE — Progress Notes (Signed)
Recreation Therapy Notes  INPATIENT RECREATION TR PLAN  Patient Details Name: Roger [name] MRN: [id number] DOB: 10/20/1990 Today's Date: 11/26/2014  Rec Therapy Plan Is patient appropriate for Therapeutic Recreation?: Yes Treatment times per week: At least 3 times a week TR Treatment/Interventions: 1:1 session, Group participation (Comment) (Appropriate participation in daily recreation therapy tx)  Discharge Criteria Pt will be discharged from therapy if:: Discharged Treatment plan/goals/alternatives discussed and agreed upon by:: Patient/family  Discharge Summary Short term goals set: See Care Plan Short term goals met: Complete Progress toward goals comments: One-to-one attended Which groups?: Self-esteem One-to-one attended: Self-esteem, stress management, coping skills, communication Reason goals not met: N/A Therapeutic equipment acquired: None Reason patient discharged from therapy: Discharge from hospital Pt/family agrees with progress & goals achieved: Yes Date patient discharged from therapy: 11/26/14   Leonette Monarch, LRT/CTRS 11/26/2014, 12:31 PM

## 2014-12-17 NOTE — ED Provider Notes (Signed)
[facility] Emergency Department Provider Note  ____________________________________________  Time seen: Approximately 8PM  I have reviewed the triage vital signs and the nursing notes.   HISTORY  Chief Complaint Psychiatric Evaluation and Suicidal HPI Roger [name] is a 24 y.o. male who was brought by [employer] on IVC for endorsing SI; pt unable to give a plan, but does endorse polysubstance abuse & wants help to "get clean."     Past Medical History  Diagnosis Date  . Headache     Patient Active Problem List   Diagnosis Date Noted  . Tobacco use disorder 11/25/2014  . Alcohol use disorder, severe, dependence 11/25/2014  . Cannabis use disorder, severe, dependence 11/25/2014  . 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 Opioid use disorder, moderate, dependence 11/25/2014  . 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 Severe recurrent major depression without psychotic features 11/24/2014    History reviewed. No pertinent past surgical history.  Current Outpatient Rx  Name  Route  Sig  Dispense  Refill  . FLUoxetine (PROZAC) 20 MG capsule   Oral   Take 1 capsule (20 mg total) by mouth daily.   7 capsule   0   . traZODone (DESYREL) 100 MG tablet   Oral   Take 1 tablet (100 mg total) by mouth at bedtime as needed for sleep.   7 tablet   0     Allergies Review of patient's allergies indicates no known allergies.  Family History  Problem Relation Age of Onset  . Alcohol abuse Father     Social History Social History  Substance Use Topics  . Smoking status: Former Smoker -- 1.00 packs/day for 10 years    Types: Cigarettes    Quit date: 11/24/2014  . Smokeless tobacco: Former Neurosurgeon    Quit date: 11/24/2014  . Alcohol Use: 6.0 oz/week    7 Shots of liquor, 3 Cans of beer per week     Comment: last drink 11/22/2014    Review of Systems Constitutional: No  fever Cardiovascular: Denies chest pain. Respiratory: Denies shortness of breath.   10-point ROS otherwise negative.  ____________________________________________   PHYSICAL EXAM:  VITAL SIGNS: ED Triage Vitals  Enc Vitals Group     BP 11/23/14 1814 128/91 mmHg     Pulse Rate 11/23/14 1814 50     Resp 11/23/14 1814 20     Temp 11/23/14 1814 98.3 F (36.8 C)     Temp Source 11/23/14 1814 Oral     SpO2 11/23/14 1814 98 %     Weight 11/23/14 1814 127 lb (57.607 kg)     Height 11/23/14 1814  (1.676 m)     Head Cir --      Peak Flow --      Pain Score --      Pain Loc --      Pain Edu? --      Excl. in GC? --    Constitutional: Alert and oriented. Well appearing and in no acute distress. Eyes: Conjunctivae are normal. PERRL. EOMI. Head: Atraumatic. Nose: No congestion/rhinnorhea. Mouth/Throat: Mucous membranes are moist.   Neck: No stridor.   Cardiovascular: Normal rate, regular rhythm. Grossly normal heart sounds.  Good peripheral circulation. Respiratory: Normal respiratory effort.  No retractions. Lungs CTAB. Gastrointestinal: Soft and nontender. No distention. No abdominal bruits. No CVA tenderness. Musculoskeletal: No lower extremity tenderness nor edema.  No joint effusions. Neurologic:  Normal  speech and language. No gross focal neurologic deficits are appreciated. Skin:  Skin is warm, dry and intact. No rash noted. Psychiatric: Mood and affect are normal. Speech and behavior are normal.  ____________________________________________   LABS (all labs ordered are listed, but only abnormal results are displayed)  Labs Reviewed  COMPREHENSIVE METABOLIC PANEL - Abnormal; Notable for the following:    Sodium 132 (*)    Chloride 94 (*)    Glucose, Bld 113 (*)    Creatinine, Ser 1.30 (*)    ALT 14 (*)    All other components within normal limits  ACETAMINOPHEN LEVEL - Abnormal; Notable for the following:    Acetaminophen (Tylenol), Serum <10 (*)    All other  components within normal limits  URINE DRUG SCREEN, QUALITATIVE ([facility] ONLY) - Abnormal; Notable for the following:    Amphetamines, Ur Screen POSITIVE (*)    Cocaine Metabolite,Ur [place] POSITIVE (*)    Cannabinoid 50 Ng, Ur [place] POSITIVE (*)    All other components within normal limits  ETHANOL  SALICYLATE LEVEL  CBC   ____________________________________________  ____________________________________________   INITIAL IMPRESSION / ASSESSMENT AND PLAN / ED COURSE  Pertinent labs & imaging results that were available during my care of the patient were reviewed by me and considered in my medical decision making (see chart for details).  Patient is medically cleared for psychiatric referral ____________________________________________   FINAL CLINICAL IMPRESSION(S) / ED DIAGNOSES  Final diagnoses:  Severe recurrent major depression without psychotic features   Polysubstance abuse  Maurilio Lovely, MD 12/17/14 1601

## 2015-06-21 ENCOUNTER — Emergency Department
Admission: EM | Admit: 2015-06-21 | Discharge: 2015-06-21 | Disposition: A | Discharge status: Home / Self Care | Payer: Self-pay | Attending: Emergency Medicine | Admitting: Emergency Medicine

## 2015-06-21 ENCOUNTER — Emergency Department: Payer: Self-pay

## 2015-06-21 ENCOUNTER — Encounter: Payer: Self-pay | Admitting: Emergency Medicine

## 2015-06-21 DIAGNOSIS — Y92009 Unspecified place in unspecified non-institutional (private) residence as the place of occurrence of the external cause: Secondary | ICD-10-CM | POA: Insufficient documentation

## 2015-06-21 DIAGNOSIS — Z79899 Other long term (current) drug therapy: Secondary | ICD-10-CM | POA: Insufficient documentation

## 2015-06-21 DIAGNOSIS — Y998 Other external cause status: Secondary | ICD-10-CM | POA: Insufficient documentation

## 2015-06-21 DIAGNOSIS — Z87891 Personal history of nicotine dependence: Secondary | ICD-10-CM | POA: Insufficient documentation

## 2015-06-21 DIAGNOSIS — M542 Cervicalgia: Secondary | ICD-10-CM

## 2015-06-21 DIAGNOSIS — Y9389 Activity, other specified: Secondary | ICD-10-CM | POA: Insufficient documentation

## 2015-06-21 DIAGNOSIS — X58XXXA Exposure to other specified factors, initial encounter: Secondary | ICD-10-CM | POA: Insufficient documentation

## 2015-06-21 DIAGNOSIS — S161XXA Strain of muscle, fascia and tendon at neck level, initial encounter: Secondary | ICD-10-CM | POA: Insufficient documentation

## 2015-06-21 MED ORDER — NAPROXEN 500 MG PO TABS
500.0000 mg | ORAL_TABLET | Freq: Once | ORAL | Status: AC
  Administered 2015-06-21: 500 mg via ORAL
  Filled 2015-06-21: qty 1

## 2015-06-21 MED ORDER — NAPROXEN 500 MG PO TBEC: 500.0000 mg | DELAYED_RELEASE_TABLET | Freq: Two times a day (BID) | ORAL | Status: DC

## 2015-06-21 MED ORDER — CYCLOBENZAPRINE HCL 10 MG PO TABS
5.0000 mg | ORAL_TABLET | Freq: Once | ORAL | Status: AC
  Administered 2015-06-21: 5 mg via ORAL
  Filled 2015-06-21: qty 1

## 2015-06-21 MED ORDER — CYCLOBENZAPRINE HCL 5 MG PO TABS: 5.0000 mg | ORAL_TABLET | Freq: Three times a day (TID) | ORAL | Status: DC | PRN

## 2015-06-21 NOTE — ED Provider Notes (Signed)
[facility] Emergency Department Provider Note ____________________________________________  Time seen: 1641  I have reviewed the triage vital signs and the nursing notes.  HISTORY  Chief Complaint  Neck Injury  HPI Roger [name] is a 25 y.o. male resents to the ED which should've continued intermittent neck pain following an injury 2 months prior. He describes that he was at home and attempted to do a standing back flip in his front yard on 2 separate attempts. Each time he reports not landing the flip, but instead face-plantingin the grass. He describes it both times he felt a "crack" in his neck. He did not seek immediate or interim medical evaluation since the date of injury. Since that time he is described intermittent pain to the neck with movement and some tightness to the left neck and left upper extremity. He describes the pain is aggravated by work activities. He also describes pain seems to be increased when he transitions from supine to sit. He has not attempted any interventions or medications since the accident denies any use of ice, heat, or medications. He also denies any upper extremity weakness, grip changes, or distal paresthesias. He is a left-hand dominant male who describes the pain to his left neck and upper extremities at 8/10 and describes it as constant discomfort.  Past Medical History  Diagnosis Date  . Headache     Patient Active Problem List   Diagnosis Date Noted  . Tobacco use disorder 11/25/2014  . Alcohol use disorder, severe, dependence (HCC) 11/25/2014  . Cannabis use disorder, severe, dependence (HCC) 11/25/2014  . 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 Opioid use disorder, moderate, dependence (HCC) 11/25/2014  . 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 Severe recurrent major depression without psychotic features (HCC) 11/24/2014    Past  Surgical History  Procedure Laterality Date  . Femur fracture surgery      Current Outpatient Rx  Name  Route  Sig  Dispense  Refill  . cyclobenzaprine (FLEXERIL) 5 MG tablet   Oral   Take 1 tablet (5 mg total) by mouth 3 (three) times daily as needed for muscle spasms.   15 tablet   0   . FLUoxetine (PROZAC) 20 MG capsule   Oral   Take 1 capsule (20 mg total) by mouth daily.   7 capsule   0   . naproxen (EC NAPROSYN) 500 MG EC tablet   Oral   Take 1 tablet (500 mg total) by mouth 2 (two) times daily with a meal.   30 tablet   0   . traZODone (DESYREL) 100 MG tablet   Oral   Take 1 tablet (100 mg total) by mouth at bedtime as needed for sleep.   7 tablet   0    Allergies Review of patient's allergies indicates no known allergies.  Family History  Problem Relation Age of Onset  . Alcohol abuse Father     Social History Social History  Substance Use Topics  . Smoking status: Former Smoker -- 1.00 packs/day for 10 years    Types: Cigarettes    Quit date: 11/24/2014  . Smokeless tobacco: Former Neurosurgeon    Quit date: 11/24/2014  . Alcohol Use: 6.0 oz/week    3 Cans of beer, 7 Shots of liquor per week     Comment: last drink 11/22/2014   Review of Systems  Constitutional: Negative for fever. Eyes: Negative for visual changes.  ENT: Negative for sore throat. Cardiovascular: Negative for chest pain. Respiratory: Negative for shortness of breath. Gastrointestinal: Negative for abdominal pain, vomiting and diarrhea. Genitourinary: Negative for dysuria. Musculoskeletal: Negative for back pain. Reports neck pain as above Skin: Negative for rash. Neurological: Negative for headaches, focal weakness or numbness. ____________________________________________  PHYSICAL EXAM:  VITAL SIGNS: ED Triage Vitals  Enc Vitals Group     BP 06/21/15 1457 140/71 mmHg     Pulse Rate 06/21/15 1457 66     Resp 06/21/15 1457 16     Temp 06/21/15 1457 98 F (36.7 C)     Temp  Source 06/21/15 1457 Oral     SpO2 06/21/15 1457 98 %     Weight 06/21/15 1457 130 lb (58.968 kg)     Height 06/21/15 1457  (1.676 m)     Head Cir --      Peak Flow --      Pain Score 06/21/15 1457 10     Pain Loc --      Pain Edu? --      Excl. in [place]? --    Constitutional: Alert and oriented. Well appearing and in no distress. Head: Normocephalic and atraumatic.      Eyes: Conjunctivae are normal. PERRL. Normal extraocular movements      Ears: Canals clear. TMs intact bilaterally.   Nose: No congestion/rhinorrhea.   Mouth/Throat: Mucous membranes are moist.   Neck: Supple. No thyromegaly.  Hematological/Lymphatic/Immunological: No cervical lymphadenopathy. Cardiovascular: Normal rate, regular rhythm.  Respiratory: Normal respiratory effort. No wheezes/rales/rhonchi. Gastrointestinal: Soft and nontender. No distention. Musculoskeletal: He she'll with normal spinal alignment without midline tenderness, spasm, deformity, or step-off to the cervical spine. He is noted to have full active range of motion of the C-spine without deficit. He is also able to exhibit normal upper extremity range of motion and rotator cuff strength testing. Nontender with normal range of motion in all extremities.  Neurologic:  Cranial nerves II through XII grossly intact. Normal UE DTRs bilaterally. Normal intrinsic and opposition testing. Normal gait without ataxia. Normal speech and language. No gross focal neurologic deficits are appreciated. Skin:  Skin is warm, dry and intact. No rash noted. Psychiatric: Mood and affect are normal. Patient exhibits appropriate insight and judgment. ____________________________________________   RADIOLOGY  Cervical Spine IMPRESSION: 1. No fracture detected in the cervical spine. 2. Slight reversal of the normal cervical lordosis, usually due to positioning and/or muscle spasm. Minimal 2 mm retrolisthesis at C6-7. Given the persistent prolonged neck  symptoms after injury, consider further evaluation with MRI of the cervical spine to evaluate for ligamentous injury.  I, Hadriel Northup, Charlesetta Ivory, personally viewed and evaluated these images (plain radiographs) as part of my medical decision making, as well as reviewing the written report by the radiologist. ____________________________________________  PROCEDURES  Naproxen 500 mg PO Flexeril 5 mg PO Soft cervical collar ____________________________________________  INITIAL IMPRESSION / ASSESSMENT AND PLAN / ED COURSE  Patient with a benign neuromuscular exam 2 months status post injury. X-ray is reassuring for any acute fracture dislocation. We discussed in detail the presumed neck strain and muscle spasm indicated by the x-ray. Decision to defer MRI testing at this time was discussed with the patient, given his stable exam, duration of injury, and the likelihood of response to newly initiated treatment and support.  Patient will be fitted with a soft cervical collar to wear no more than 4 hours while active over the next 2 weeks. He is also provided  with a prescription for Flexeril and EC Naprosyn a dose as directed. He is given instructional cryotherapy as well as heat therapy and gentle range of motion exercises for the cervical spine. He will be referred to Dr. Rosita KeaMenz for ongoing symptom management. He is encouraged to return to the ED for acutely worsening symptoms. ____________________________________________  FINAL CLINICAL IMPRESSION(S) / ED DIAGNOSES  Final diagnoses:  Cervical strain, acute, initial encounter  Muscle pain, cervical     Lissa HoardJenise V Bacon Anand Tejada, PA-C 06/22/15 0054  Sharyn CreamerMark Quale, MD 06/25/15 1515

## 2015-06-21 NOTE — ED Notes (Signed)
See triage note. States he is still having some pain in neck  Feels like he is having mobility problems d/t pain

## 2015-06-21 NOTE — ED Notes (Signed)
Pt reports 6+ months ago, he did a back flip and landed on his head, reports continued neck pain. Reports he came today due to inability to work anymore due to limited neck mobility.

## 2015-06-21 NOTE — Discharge Instructions (Signed)
Cervical Sprain °A cervical sprain is an injury in the neck in which the strong, fibrous tissues (ligaments) that connect your neck bones stretch or tear. Cervical sprains can range from mild to severe. Severe cervical sprains can cause the neck vertebrae to be unstable. This can lead to damage of the spinal cord and can result in serious nervous system problems. The amount of time it takes for a cervical sprain to get better depends on the cause and extent of the injury. Most cervical sprains heal in 1 to 3 weeks. °CAUSES  °Severe cervical sprains may be caused by:  °· Contact sport injuries (such as from football, rugby, wrestling, hockey, auto racing, gymnastics, diving, martial arts, or boxing).   °· Motor vehicle collisions.   °· Whiplash injuries. This is an injury from a sudden forward and backward whipping movement of the head and neck.  °· Falls.   °Mild cervical sprains may be caused by:  °· Being in an awkward position, such as while cradling a telephone between your ear and shoulder.   °· Sitting in a chair that does not offer proper support.   °· Working at a poorly designed computer station.   °· Looking up or down for long periods of time.   °SYMPTOMS  °· Pain, soreness, stiffness, or a burning sensation in the front, back, or sides of the neck. This discomfort may develop immediately after the injury or slowly, 24 hours or more after the injury.   °· Pain or tenderness directly in the middle of the back of the neck.   °· Shoulder or upper back pain.   °· Limited ability to move the neck.   °· Headache.   °· Dizziness.   °· Weakness, numbness, or tingling in the hands or arms.   °· Muscle spasms.   °· Difficulty swallowing or chewing.   °· Tenderness and swelling of the neck.   °DIAGNOSIS  °Most of the time your health care provider can diagnose a cervical sprain by taking your history and doing a physical exam. Your health care provider will ask about previous neck injuries and any known neck  problems, such as arthritis in the neck. X-rays may be taken to find out if there are any other problems, such as with the bones of the neck. Other tests, such as a CT scan or MRI, may also be needed.  °TREATMENT  °Treatment depends on the severity of the cervical sprain. Mild sprains can be treated with rest, keeping the neck in place (immobilization), and pain medicines. Severe cervical sprains are immediately immobilized. Further treatment is done to help with pain, muscle spasms, and other symptoms and may include: °· Medicines, such as pain relievers, numbing medicines, or muscle relaxants.   °· Physical therapy. This may involve stretching exercises, strengthening exercises, and posture training. Exercises and improved posture can help stabilize the neck, strengthen muscles, and help stop symptoms from returning.   °HOME CARE INSTRUCTIONS  °· Put ice on the injured area.   °¨ Put ice in a plastic bag.   °¨ Place a towel between your skin and the bag.   °¨ Leave the ice on for 15-20 minutes, 3-4 times a day.   °· If your injury was severe, you may have been given a cervical collar to wear. A cervical collar is a two-piece collar designed to keep your neck from moving while it heals. °¨ Do not remove the collar unless instructed by your health care provider. °¨ If you have long hair, keep it outside of the collar. °¨ Ask your health care provider before making any adjustments to your collar. Minor   adjustments may be required over time to improve comfort and reduce pressure on your chin or on the back of your head.  Ifyou are allowed to remove the collar for cleaning or bathing, follow your health care provider's instructions on how to do so safely.  Keep your collar clean by wiping it with mild soap and water and drying it completely. If the collar you have been given includes removable pads, remove them every 1-2 days and hand wash them with soap and water. Allow them to air dry. They should be completely  dry before you wear them in the collar.  If you are allowed to remove the collar for cleaning and bathing, wash and dry the skin of your neck. Check your skin for irritation or sores. If you see any, tell your health care provider.  Do not drive while wearing the collar.   Only take over-the-counter or prescription medicines for pain, discomfort, or fever as directed by your health care provider.   Keep all follow-up appointments as directed by your health care provider.   Keep all physical therapy appointments as directed by your health care provider.   Make any needed adjustments to your workstation to promote good posture.   Avoid positions and activities that make your symptoms worse.   Warm up and stretch before being active to help prevent problems.  SEEK MEDICAL CARE IF:   Your pain is not controlled with medicine.   You are unable to decrease your pain medicine over time as planned.   Your activity level is not improving as expected.  SEEK IMMEDIATE MEDICAL CARE IF:   You develop any bleeding.  You develop stomach upset.  You have signs of an allergic reaction to your medicine.   Your symptoms get worse.   You develop new, unexplained symptoms.   You have numbness, tingling, weakness, or paralysis in any part of your body.  MAKE SURE YOU:   Understand these instructions.  Will watch your condition.  Will get help right away if you are not doing well or get worse.   This information is not intended to replace advice given to you by your health care provider. Make sure you discuss any questions you have with your health care provider.   Document Released: 01/29/2007 Document Revised: 04/08/2013 Document Reviewed: 10/09/2012 Elsevier Interactive Patient Education 2016 [employer]  Cryotherapy Cryotherapy is when you put ice on your injury. Ice helps lessen pain and puffiness (swelling) after an injury. Ice works the best when you start using it  in the first 24 to 48 hours after an injury. HOME CARE  Put a dry or damp towel between the ice pack and your skin.  You may press gently on the ice pack.  Leave the ice on for no more than 10 to 20 minutes at a time.  Check your skin after 5 minutes to make sure your skin is okay.  Rest at least 20 minutes between ice pack uses.  Stop using ice when your skin loses feeling (numbness).  Do not use ice on someone who cannot tell you when it hurts. This includes small children and people with memory problems (dementia). GET HELP RIGHT AWAY IF:  You have white spots on your skin.  Your skin turns blue or pale.  Your skin feels waxy or hard.  Your puffiness gets worse. MAKE SURE YOU:   Understand these instructions.  Will watch your condition.  Will get help right away if you are not  doing well or get worse.   This information is not intended to replace advice given to you by your health care provider. Make sure you discuss any questions you have with your health care provider.   Document Released: 09/20/2007 Document Revised: 06/26/2011 Document Reviewed: 11/24/2010 Elsevier Interactive Patient Education [employer]  Your exam and x-ray were essentially normal today. You should wear the neck brace for no more than 3-4 hours per day for the next 1-2 weeks as needed. Take the prescription meds as directed. Follow-up with Dr. Rosita Kea for continue symptoms. Return to the ED for acutely worsening symptoms.

## 2015-06-21 NOTE — ED Notes (Signed)
Patient with no complaints at this time. Respirations even and unlabored. Skin warm/dry. Discharge instructions reviewed with patient at this time. Patient given opportunity to voice concerns/ask questions. Patient discharged at this time and left Emergency Department with steady gait.   

## 2015-12-23 ENCOUNTER — Emergency Department
Admission: EM | Admit: 2015-12-23 | Discharge: 2015-12-24 | Disposition: A | Discharge status: Other Acute Inpatient Hospital | Payer: Self-pay | Attending: Emergency Medicine | Admitting: Emergency Medicine

## 2015-12-23 DIAGNOSIS — R45850 Homicidal ideations: Secondary | ICD-10-CM

## 2015-12-23 DIAGNOSIS — R440 Auditory hallucinations: Secondary | ICD-10-CM | POA: Insufficient documentation

## 2015-12-23 DIAGNOSIS — F1220 Cannabis dependence, uncomplicated: Secondary | ICD-10-CM | POA: Diagnosis present

## 2015-12-23 DIAGNOSIS — F332 Major depressive disorder, recurrent severe without psychotic features: Secondary | ICD-10-CM | POA: Diagnosis present

## 2015-12-23 DIAGNOSIS — F1290 Cannabis use, unspecified, uncomplicated: Secondary | ICD-10-CM | POA: Insufficient documentation

## 2015-12-23 DIAGNOSIS — F1020 Alcohol dependence, uncomplicated: Secondary | ICD-10-CM | POA: Diagnosis present

## 2015-12-23 DIAGNOSIS — Z5181 Encounter for therapeutic drug level monitoring: Secondary | ICD-10-CM | POA: Insufficient documentation

## 2015-12-23 DIAGNOSIS — F1490 Cocaine use, unspecified, uncomplicated: Secondary | ICD-10-CM | POA: Insufficient documentation

## 2015-12-23 DIAGNOSIS — R45851 Suicidal ideations: Secondary | ICD-10-CM

## 2015-12-23 DIAGNOSIS — F4325 Adjustment disorder with mixed disturbance of emotions and conduct: Secondary | ICD-10-CM

## 2015-12-23 DIAGNOSIS — F17210 Nicotine dependence, cigarettes, uncomplicated: Secondary | ICD-10-CM | POA: Insufficient documentation

## 2015-12-23 DIAGNOSIS — F1590 Other stimulant use, unspecified, uncomplicated: Secondary | ICD-10-CM | POA: Insufficient documentation

## 2015-12-23 LAB — URINE DRUG SCREEN, QUALITATIVE (ARMC ONLY)
Amphetamines, Ur Screen: NOT DETECTED
Barbiturates, Ur Screen: NOT DETECTED
Benzodiazepine, Ur Scrn: NOT DETECTED
CANNABINOID 50 NG, UR ~~LOC~~: NOT DETECTED
Cocaine Metabolite,Ur ~~LOC~~: NOT DETECTED
MDMA (ECSTASY) UR SCREEN: NOT DETECTED
Methadone Scn, Ur: NOT DETECTED
OPIATE, UR SCREEN: NOT DETECTED
PHENCYCLIDINE (PCP) UR S: NOT DETECTED
Tricyclic, Ur Screen: NOT DETECTED

## 2015-12-23 LAB — COMPREHENSIVE METABOLIC PANEL
ALT: 19 U/L (ref 17–63)
ANION GAP: 7 (ref 5–15)
AST: 22 U/L (ref 15–41)
Albumin: 4.5 g/dL (ref 3.5–5.0)
Alkaline Phosphatase: 67 U/L (ref 38–126)
BUN: 16 mg/dL (ref 6–20)
CHLORIDE: 103 mmol/L (ref 101–111)
CO2: 27 mmol/L (ref 22–32)
Calcium: 9.4 mg/dL (ref 8.9–10.3)
Creatinine, Ser: 0.98 mg/dL (ref 0.61–1.24)
GFR calc non Af Amer: >60 mL/min (ref >60)
Glucose, Bld: 98 mg/dL (ref 65–99)
Potassium: 4.4 mmol/L (ref 3.5–5.1)
SODIUM: 137 mmol/L (ref 135–145)
Total Bilirubin: 1 mg/dL (ref 0.3–1.2)
Total Protein: 7.6 g/dL (ref 6.5–8.1)

## 2015-12-23 LAB — URINALYSIS COMPLETE WITH MICROSCOPIC (ARMC ONLY)
Bacteria, UA: NONE SEEN
Bilirubin Urine: NEGATIVE
Glucose, UA: NEGATIVE mg/dL
Hgb urine dipstick: NEGATIVE
KETONES UR: NEGATIVE mg/dL
Leukocytes, UA: NEGATIVE
NITRITE: NEGATIVE
PH: 6 (ref 5.0–8.0)
PROTEIN: NEGATIVE mg/dL
SPECIFIC GRAVITY, URINE: 1.023 (ref 1.005–1.030)

## 2015-12-23 LAB — CBC WITH DIFFERENTIAL/PLATELET
Basophils Absolute: 0 10*3/uL (ref 0–0.1)
Basophils Relative: 0 %
Eosinophils Absolute: 0.3 10*3/uL (ref 0–0.7)
Eosinophils Relative: 3 %
HEMATOCRIT: 46.9 % (ref 40.0–52.0)
Hemoglobin: 16.3 g/dL (ref 13.0–18.0)
LYMPHS ABS: 2.9 10*3/uL (ref 1.0–3.6)
Lymphocytes Relative: 29 %
MCH: 30.5 pg (ref 26.0–34.0)
MCHC: 34.9 g/dL (ref 32.0–36.0)
MCV: 87.5 fL (ref 80.0–100.0)
MONO ABS: 0.7 10*3/uL (ref 0.2–1.0)
Monocytes Relative: 7 %
Neutro Abs: 5.9 10*3/uL (ref 1.4–6.5)
Neutrophils Relative %: 61 %
Platelets: 261 10*3/uL (ref 150–440)
RBC: 5.36 MIL/uL (ref 4.40–5.90)
RDW: 14.2 % (ref 11.5–14.5)
WBC: 9.8 10*3/uL (ref 3.8–10.6)

## 2015-12-23 LAB — SALICYLATE LEVEL: Salicylate Lvl: <4 mg/dL (ref 2.8–30.0)

## 2015-12-23 LAB — ACETAMINOPHEN LEVEL

## 2015-12-23 LAB — ETHANOL: Alcohol, Ethyl (B): <5 mg/dL (ref <5)

## 2015-12-23 MED ORDER — HALOPERIDOL 0.5 MG PO TABS
ORAL_TABLET | ORAL | Status: AC
  Filled 2015-12-23: qty 2

## 2015-12-23 MED ORDER — HALOPERIDOL 0.5 MG PO TABS
1.0000 mg | ORAL_TABLET | Freq: Two times a day (BID) | ORAL | Status: DC
  Administered 2015-12-23 – 2015-12-24 (×3): 1 mg via ORAL
  Filled 2015-12-23 (×2): qty 2

## 2015-12-23 MED ORDER — FLUOXETINE HCL 20 MG PO CAPS
ORAL_CAPSULE | ORAL | Status: AC
  Administered 2015-12-23: 20 mg via ORAL
  Filled 2015-12-23: qty 1

## 2015-12-23 MED ORDER — FLUOXETINE HCL 20 MG PO CAPS
20.0000 mg | ORAL_CAPSULE | Freq: Every day | ORAL | Status: DC
  Administered 2015-12-23 – 2015-12-24 (×2): 20 mg via ORAL
  Filled 2015-12-23: qty 1
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
## 2015-12-23 NOTE — ED Notes (Signed)
TTS at bedside. 

## 2015-12-23 NOTE — ED Notes (Signed)
Report was received from Verdis Fredericksonuth B., RN; Pt. Verbalizes no complaints or distress; denies S.I; verbalizes having /Hi. Continue to monitor with 15 min. Monitoring.
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
## 2015-12-24 ENCOUNTER — Inpatient Hospital Stay
Admission: AD | Admit: 2015-12-24 | Discharge: 2015-12-28 | DRG: 885 | Disposition: A | Discharge status: Home / Self Care | Payer: No Typology Code available for payment source | Source: Intra-hospital | Attending: Psychiatry | Admitting: Psychiatry

## 2015-12-24 DIAGNOSIS — F332 Major depressive disorder, recurrent severe without psychotic features: Principal | ICD-10-CM | POA: Diagnosis present

## 2015-12-24 DIAGNOSIS — Z590 Homelessness: Secondary | ICD-10-CM

## 2015-12-24 DIAGNOSIS — Z811 Family history of alcohol abuse and dependence: Secondary | ICD-10-CM

## 2015-12-24 DIAGNOSIS — F17200 Nicotine dependence, unspecified, uncomplicated: Secondary | ICD-10-CM | POA: Diagnosis present

## 2015-12-24 DIAGNOSIS — F1020 Alcohol dependence, uncomplicated: Secondary | ICD-10-CM | POA: Diagnosis present

## 2015-12-24 DIAGNOSIS — Z915 Personal history of self-harm: Secondary | ICD-10-CM

## 2015-12-24 DIAGNOSIS — G4700 Insomnia, unspecified: Secondary | ICD-10-CM | POA: Diagnosis present

## 2015-12-24 DIAGNOSIS — R45850 Homicidal ideations: Secondary | ICD-10-CM | POA: Diagnosis present

## 2015-12-24 DIAGNOSIS — F17210 Nicotine dependence, cigarettes, uncomplicated: Secondary | ICD-10-CM | POA: Diagnosis present

## 2015-12-24 DIAGNOSIS — Z9889 Other specified postprocedural states: Secondary | ICD-10-CM

## 2015-12-24 DIAGNOSIS — Z560 Unemployment, unspecified: Secondary | ICD-10-CM

## 2015-12-24 DIAGNOSIS — R45851 Suicidal ideations: Secondary | ICD-10-CM | POA: Diagnosis present

## 2015-12-24 NOTE — ED Notes (Signed)
Lunch tray given. 

## 2015-12-24 NOTE — ED Notes (Signed)

## 2015-12-24 NOTE — Progress Notes (Signed)
Roger [name] has slept most of the day. When he awoke for med admin this a.m., he denied SI/AVH but admitted HI. When asked for specifics, he was vague but alluded to events outlined in the chart involving his biological mother. Roger [name] seemed to believe "no one" and "nothing" could help him. He did not think medical treatment could help his problems. He was at all times polite, calm, and cooperative. He has remained in his room since. Will continue to monitor for needs/safety.

## 2015-12-24 NOTE — ED Notes (Signed)
Dinner tray given

## 2015-12-24 NOTE — ED Provider Notes (Signed)
-----------------------------------------   7:21 AM on 12/24/2015 -----------------------------------------   Blood pressure 112/66, pulse (!) 41, temperature 97.4 F (36.3 C), temperature source Oral, resp. rate 18, height 5\' 7"  (1.702 m), weight 129 lb (58.5 kg), SpO2 100 %.  The patient had no acute events since last update.  Calm and cooperative at this time.  Disposition is pending Psychiatry/Behavioral Medicine team recommendations.     Irean HongJade J Sung, MD 12/24/15 [phone]

## 2015-12-24 NOTE — BH Assessment (Signed)
DUTY TO WARN Writer received a phone call from the staff at [facility] ([phone]). They report of contacting patient's mother and Patent examinerLaw Enforcement Physicist, medical([place] [employer]) to tell her the patient was having thoughts of harming her.

## 2015-12-24 NOTE — BH Assessment (Signed)
[facility] by Dr. Toni Amendlapacs.  Attending Physician will be Dr. Jennet MaduroPucilowska.   Patient has been assigned to room 313, by [facility] Charge Nurse Edwena BundeJanet J.   Intake Paper Work has been signed and placed on patient chart.  ER staff is aware of the admission Misty Stanley(Lisa, ER Sect.; Dr. Cyril LoosenKinner, ER MD; Clydie BraunKaren Patient's Nurse & Marny LowensteinUrsula, Patient Access).

## 2015-12-24 NOTE — Consult Note (Signed)
[facility] Psychiatry Consult   Reason for Consult:  Consult for 25 year old man with a history of recurrent severe depression and substance abuse came in on commitment from [facility] Referring Physician:  Quentin Cornwall Patient Identification: Roger [name] MRN:  [id number] Principal Diagnosis: Adjustment disorder with mixed disturbance of emotions and conduct Diagnosis:   Patient Active Problem List   Diagnosis Date Noted  . Adjustment disorder with mixed disturbance of emotions and conduct [F43.25] 12/23/2015  . Tobacco use disorder [F17.200] 11/25/2014  . Alcohol use disorder, severe, dependence ([place]) [F10.20] 11/25/2014  . Cannabis use disorder, severe, dependence ([place]) [F12.20] 11/25/2014  . 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 Opioid use disorder, moderate, dependence ([place]) [F11.20] 11/25/2014  . 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 Severe recurrent major depression without psychotic features ([place]) [F33.2] 11/24/2014    Total Time spent with patient: 20 minutes  Subjective:   Roger [name] is a 25 y.o. male patient admitted with "things have just gotten out of control".   Follow-up for this 25 year old man with a history of mood symptoms who had been brought to the ER with reported suicidal and homicidal ideation. Patient has been in the emergency room for a another day now due to difficulties with admissions downstairs. He is denying any acute suicidal or homicidal intent today. He remains irritated and dysphoric. He is clearly an pleased with being stuck here in the hospital. So far he has been compliant with medications however and seems to be tolerating them well. Has not been agitated or bizarre or violent.  HPI:  Patient interviewed. Chart reviewed. All notes reviewed. The referral paperwork from [facility] was reviewed. Labs reviewed. This 25 year old man was sent  here on commitment filed at [facility] reporting that he has active suicidal and homicidal ideation. The patient tells me the same information that is in the referral paperwork. The major stress he feels he is under involves his relationship with his biological mother. In short, he had never really met her before until earlier this year. After traveling to [place] to meet her things have transpired to the point that they have had sex on 2 occasions. Patient says that this has driven him into a state of anger and rage depression and self-defeating. He left his job a couple weeks ago because he couldn't concentrate. He's been sleeping in his car. He's been having suicidal thoughts with thoughts of shooting himself or wrecking his car. He says last time he saw his mother she was drunk and she was speaking to him in an insulting way and he felt certain that he was going to kill her. He claims that he has cut back on his own alcohol and drug use in the last couple weeks. He is not currently taking any psychiatric medicine. Sleep and appetite have both been very poor and he feels like he has lost weight.  Social history: Currently not really staying much of anywhere. Used to live with his grandmother but won't go back there right now. Sleeping in his car. 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  Medical history: Other than his substance abuse no history of any significant medical problems  Substance abuse history: History of abuse of alcohol and multiple drugs. Drug screen not back yet but he claims he has cut back recently.  Past Psychiatric History: One prior admission to our hospital last year. Diagnosed with depression. Treated with Prozac. Also clear-cut substance  abuse problem. Hasn't followed up at all since he was here last time never stayed on his medicine. Didn't try for any kind of substance abuse treatment. He has a history of suicidal thoughts but no real suicide attempts in the past.  Risk to Self: Suicidal  Ideation: No Suicidal Intent: No Is patient at risk for suicide?: No Suicidal Plan?: No Access to Means: No What has been your use of drugs/alcohol within the last 12 months?: Alcohol & Cannabis How many times?: 0 Other Self Harm Risks: Active Addiction Triggers for Past Attempts: None known Intentional Self Injurious Behavior: None Risk to Others: Homicidal Ideation: Yes-Currently Present Thoughts of Harm to Others: Yes-Currently Present Comment - Thoughts of Harm to Others: Towards biological mother Current Homicidal Intent: No Current Homicidal Plan: Yes-Currently Present Describe Current Homicidal Plan: "I can beat her to death with my hands." Access to Homicidal Means: No Identified Victim: Towards biological mother History of harm to others?: No Assessment of Violence: None Noted Violent Behavior Description: Reports of none Does patient have access to weapons?: No Does patient have a court date: No Prior Inpatient Therapy: Prior Inpatient Therapy: No Prior Therapy Dates: Reports of none Prior Therapy Facilty/Provider(s): Reports of none Reason for Treatment: Reports of none Prior Outpatient Therapy: Prior Outpatient Therapy: No Prior Therapy Dates: Reports of none Prior Therapy Facilty/Provider(s): Reports of none Reason for Treatment: Reports of none Does patient have an ACCT team?: No Does patient have Intensive In-House Services?  : No Does patient have [facility] services? : No Does patient have P4CC services?: No  Past Medical History:  Past Medical History:  Diagnosis Date  . Headache     Past Surgical History:  Procedure Laterality Date  . FEMUR FRACTURE SURGERY     Family History:  Family History  Problem Relation Age of Onset  . Alcohol abuse Father    Family Psychiatric  History: There appears to be a pretty extensive family history of substance abuse especially on his mother's side Social History:  History  Alcohol Use  . 6.0 oz/week  . 3 Cans of  beer, 7 Shots of liquor per week    Comment: last drink 11/22/2014     History  Drug Use  . Types: Amphetamines, Cocaine, Marijuana, Benzodiazepines, MDMA (Ecstacy), Hydrocodone    Social History   Social History  . Marital status: Single    Spouse name: N/A  . Number of children: N/A  . Years of education: N/A   Social History Main Topics  . Smoking status: Current Every Day Smoker    Packs/day: 1.00    Years: 10.00    Types: Cigarettes    Last attempt to quit: 11/24/2014  . Smokeless tobacco: Former Neurosurgeon    Quit date: 11/24/2014  . Alcohol use 6.0 oz/week    3 Cans of beer, 7 Shots of liquor per week     Comment: last drink 11/22/2014  . Drug use:     Types: Amphetamines, Cocaine, Marijuana, Benzodiazepines, MDMA (Ecstacy), Hydrocodone  . Sexual activity: Yes    Birth control/ protection: None   Other Topics Concern  . Not on file   Social History Narrative  . No narrative on file   Additional Social History:    Allergies:  No Known Allergies  Labs:  Results for orders placed or performed during the hospital encounter of 12/23/15 (from the past 48 hour(s))  CBC with Differential     Status: None   Collection Time: 12/23/15  1:19 PM  Result Value Ref Range   WBC 9.8 3.8 - 10.6 K/uL   RBC 5.36 4.40 - 5.90 MIL/uL   Hemoglobin 16.3 13.0 - 18.0 g/dL   HCT 07.1 48.8 - 94.8 %   MCV 87.5 80.0 - 100.0 fL   MCH 30.5 26.0 - 34.0 pg   MCHC 34.9 32.0 - 36.0 g/dL   RDW 76.2 29.1 - 46.1 %   Platelets 261 150 - 440 K/uL   Neutrophils Relative % 61 %   Neutro Abs 5.9 1.4 - 6.5 K/uL   Lymphocytes Relative 29 %   Lymphs Abs 2.9 1.0 - 3.6 K/uL   Monocytes Relative 7 %   Monocytes Absolute 0.7 0.2 - 1.0 K/uL   Eosinophils Relative 3 %   Eosinophils Absolute 0.3 0 - 0.7 K/uL   Basophils Relative 0 %   Basophils Absolute 0.0 0 - 0.1 K/uL  Comprehensive metabolic panel     Status: None   Collection Time: 12/23/15  1:19 PM  Result Value Ref Range   Sodium 137 135 - 145 mmol/L    Potassium 4.4 3.5 - 5.1 mmol/L   Chloride 103 101 - 111 mmol/L   CO2 27 22 - 32 mmol/L   Glucose, Bld 98 65 - 99 mg/dL   BUN 16 6 - 20 mg/dL   Creatinine, Ser 2.74 0.61 - 1.24 mg/dL   Calcium 9.4 8.9 - 50.4 mg/dL   Total Protein 7.6 6.5 - 8.1 g/dL   Albumin 4.5 3.5 - 5.0 g/dL   AST 22 15 - 41 U/L   ALT 19 17 - 63 U/L   Alkaline Phosphatase 67 38 - 126 U/L   Total Bilirubin 1.0 0.3 - 1.2 mg/dL   GFR calc non Af Amer >60 >60 mL/min   GFR calc Af Amer >60 >60 mL/min    Comment: (NOTE) The eGFR has been calculated using the CKD EPI equation. This calculation has not been validated in all clinical situations. eGFR's persistently <60 mL/min signify possible Chronic Kidney Disease.    Anion gap 7 5 - 15  Ethanol     Status: None   Collection Time: 12/23/15  1:19 PM  Result Value Ref Range   Alcohol, Ethyl (B) <5 <5 mg/dL    Comment:        LOWEST DETECTABLE LIMIT FOR SERUM ALCOHOL IS 5 mg/dL FOR MEDICAL PURPOSES ONLY   Urinalysis complete, with microscopic ([facility] only)     Status: Abnormal   Collection Time: 12/23/15  1:19 PM  Result Value Ref Range   Color, Urine YELLOW (A) YELLOW   APPearance CLEAR (A) CLEAR   Glucose, UA NEGATIVE NEGATIVE mg/dL   Bilirubin Urine NEGATIVE NEGATIVE   Ketones, ur NEGATIVE NEGATIVE mg/dL   Specific Gravity, Urine 1.023 1.005 - 1.030   Hgb urine dipstick NEGATIVE NEGATIVE   pH 6.0 5.0 - 8.0   Protein, ur NEGATIVE NEGATIVE mg/dL   Nitrite NEGATIVE NEGATIVE   Leukocytes, UA NEGATIVE NEGATIVE   RBC / HPF 0-5 0 - 5 RBC/hpf   WBC, UA 0-5 0 - 5 WBC/hpf   Bacteria, UA NONE SEEN NONE SEEN   Squamous Epithelial / LPF 0-5 (A) NONE SEEN   Mucous PRESENT   Urine Drug Screen, Qualitative ([facility] only)     Status: None   Collection Time: 12/23/15  1:19 PM  Result Value Ref Range   Tricyclic, Ur Screen NONE DETECTED NONE DETECTED   Amphetamines, Ur Screen NONE DETECTED NONE DETECTED   MDMA (Ecstasy)Ur Screen  NONE DETECTED NONE DETECTED   Cocaine  Metabolite,Ur [place] NONE DETECTED NONE DETECTED   Opiate, Ur Screen NONE DETECTED NONE DETECTED   Phencyclidine (PCP) Ur S NONE DETECTED NONE DETECTED   Cannabinoid 50 Ng, Ur [place] NONE DETECTED NONE DETECTED   Barbiturates, Ur Screen NONE DETECTED NONE DETECTED   Benzodiazepine, Ur Scrn NONE DETECTED NONE DETECTED   Methadone Scn, Ur NONE DETECTED NONE DETECTED    Comment: (NOTE) 100  Tricyclics, urine               Cutoff 1000 ng/mL 200  Amphetamines, urine             Cutoff 1000 ng/mL 300  MDMA (Ecstasy), urine           Cutoff 500 ng/mL 400  Cocaine Metabolite, urine       Cutoff 300 ng/mL 500  Opiate, urine                   Cutoff 300 ng/mL 600  Phencyclidine (PCP), urine      Cutoff 25 ng/mL 700  Cannabinoid, urine              Cutoff 50 ng/mL 800  Barbiturates, urine             Cutoff 200 ng/mL 900  Benzodiazepine, urine           Cutoff 200 ng/mL 1000 Methadone, urine                Cutoff 300 ng/mL 1100 1200 The urine drug screen provides only a preliminary, unconfirmed 1300 analytical test result and should not be used for non-medical 1400 purposes. Clinical consideration and professional judgment should 1500 be applied to any positive drug screen result due to possible 1600 interfering substances. A more specific alternate chemical method 1700 must be used in order to obtain a confirmed analytical result.  1800 Gas chromato graphy / mass spectrometry (GC/MS) is the preferred 1900 confirmatory method.   Acetaminophen level     Status: Abnormal   Collection Time: 12/23/15  1:19 PM  Result Value Ref Range   Acetaminophen (Tylenol), Serum <10 (L) 10 - 30 ug/mL    Comment:        THERAPEUTIC CONCENTRATIONS VARY SIGNIFICANTLY. A RANGE OF 10-30 ug/mL MAY BE AN EFFECTIVE CONCENTRATION FOR MANY PATIENTS. HOWEVER, SOME ARE BEST TREATED AT CONCENTRATIONS OUTSIDE THIS RANGE. ACETAMINOPHEN CONCENTRATIONS >150 ug/mL AT 4 HOURS AFTER INGESTION AND >50 ug/mL AT 12 HOURS AFTER  INGESTION ARE OFTEN ASSOCIATED WITH TOXIC REACTIONS.   Salicylate level     Status: None   Collection Time: 12/23/15  1:19 PM  Result Value Ref Range   Salicylate Lvl <4.0 2.8 - 30.0 mg/dL    Current Facility-Administered Medications  Medication Dose Route Frequency Provider Last Rate Last Dose  . FLUoxetine (PROZAC) capsule 20 mg  20 mg Oral Daily Audery Amel, MD   20 mg at 12/24/15 3633  . haloperidol (HALDOL) tablet 1 mg  1 mg Oral BID Audery Amel, MD   1 mg at 12/24/15 0197   No current outpatient prescriptions on file.    Musculoskeletal: Strength & Muscle Tone: within normal limits Gait & Station: normal Patient leans: N/A  Psychiatric Specialty Exam: Physical Exam  Nursing note and vitals reviewed. Constitutional: He appears well-developed and well-nourished.  HENT:  Head: Normocephalic and atraumatic.  Eyes: Conjunctivae are normal. Pupils are equal, round, and reactive to light.  Neck: Normal range of  motion.  Cardiovascular: Regular rhythm and normal heart sounds.   Respiratory: Effort normal and breath sounds normal.  GI: Soft.  Musculoskeletal: Normal range of motion.  Neurological: He is alert.  Skin: Skin is warm and dry.  Psychiatric: His mood appears anxious. His affect is angry. His speech is delayed. He is slowed and withdrawn. Cognition and memory are normal. He expresses impulsivity. He exhibits a depressed mood. He expresses homicidal and suicidal ideation. He expresses suicidal plans and homicidal plans.    Review of Systems  Constitutional: Negative.   HENT: Negative.   Eyes: Negative.   Respiratory: Negative.   Cardiovascular: Negative.   Gastrointestinal: Negative.   Musculoskeletal: Negative.   Skin: Negative.   Neurological: Negative.   Psychiatric/Behavioral: Positive for depression, substance abuse and suicidal ideas. Negative for hallucinations and memory loss. The patient is nervous/anxious and has insomnia.     Blood pressure  112/66, pulse (!) 41, temperature 97.4 F (36.3 C), temperature source Oral, resp. rate 18, height '5\' 7"'$  (1.702 m), weight 58.5 kg (129 lb), SpO2 100 %.Body mass index is 20.2 kg/m.  General Appearance: Casual  Eye Contact:  Minimal  Speech:  Slow  Volume:  Decreased  Mood:  Depressed  Affect:  Depressed  Thought Process:  Goal Directed  Orientation:  Full (Time, Place, and Person)  Thought Content:  Logical  Suicidal Thoughts:  Yes.  with intent/plan  Homicidal Thoughts:  Yes.  with intent/plan  Memory:  Immediate;   Good Recent;   Fair Remote;   Fair  Judgement:  Fair  Insight:  Fair  Psychomotor Activity:  Decreased  Concentration:  Concentration: Fair  Recall:  [employer] of Knowledge:  Fair  Language:  Fair  Akathisia:  No  Handed:  Right  AIMS (if indicated):     Assets:  Desire for Improvement Leisure Time Physical Health Resilience  ADL's:  Intact  Cognition:  WNL  Sleep:        Treatment Plan Summary: Daily contact with patient to assess and evaluate symptoms and progress in treatment, Medication management and Plan Patient is now scheduled for transfer down to the psychiatry ward. Given the extremity of his recent situation and the severity of concern about his behavior I think it is still a good idea to admit him to the hospital for more complete evaluation. Were going to go forward with admission downstairs. No change for now to medication plan.  Disposition: Recommend psychiatric Inpatient admission when medically cleared. Supportive therapy provided about ongoing stressors.  Alethia Berthold, MD 12/24/2015 4:52 PM

## 2015-12-24 NOTE — ED Notes (Signed)
Report called to Margaret, RN

## 2015-12-25 DIAGNOSIS — F332 Major depressive disorder, recurrent severe without psychotic features: Principal | ICD-10-CM

## 2015-12-25 LAB — LIPID PANEL
CHOL/HDL RATIO: 2.6 ratio
Cholesterol: 153 mg/dL (ref 0–200)
HDL: 58 mg/dL (ref >40)
LDL CALC: 80 mg/dL (ref 0–99)
TRIGLYCERIDES: 73 mg/dL (ref <150)
VLDL: 15 mg/dL (ref 0–40)

## 2015-12-25 LAB — HEMOGLOBIN A1C: Hgb A1c MFr Bld: 6 % (ref 4.0–6.0)

## 2015-12-25 LAB — TSH: TSH: 1.966 u[IU]/mL (ref 0.350–4.500)

## 2015-12-25 MED ORDER — HALOPERIDOL 0.5 MG PO TABS
1.0000 mg | ORAL_TABLET | Freq: Two times a day (BID) | ORAL | Status: DC
  Administered 2015-12-25 – 2015-12-26 (×4): 1 mg via ORAL
  Filled 2015-12-25 (×5): qty 2

## 2015-12-25 MED ORDER — MAGNESIUM HYDROXIDE 400 MG/5ML PO SUSP: 30.0000 mL | Freq: Every day | ORAL | Status: DC | PRN

## 2015-12-25 MED ORDER — NICOTINE 21 MG/24HR TD PT24
21.0000 mg | MEDICATED_PATCH | Freq: Every day | TRANSDERMAL | Status: DC
  Administered 2015-12-25 – 2015-12-28 (×4): 21 mg via TRANSDERMAL
  Filled 2015-12-25 (×4): qty 1

## 2015-12-25 MED ORDER — FLUOXETINE HCL 20 MG PO CAPS
20.0000 mg | ORAL_CAPSULE | Freq: Every day | ORAL | Status: DC
  Administered 2015-12-25 – 2015-12-28 (×4): 20 mg via ORAL
  Filled 2015-12-25 (×4): qty 1

## 2015-12-25 MED ORDER — ALUM & MAG HYDROXIDE-SIMETH 200-200-20 MG/5ML PO SUSP: 30.0000 mL | ORAL | Status: DC | PRN

## 2015-12-25 MED ORDER — ACETAMINOPHEN 325 MG PO TABS: 650.0000 mg | ORAL_TABLET | Freq: Four times a day (QID) | ORAL | Status: DC | PRN

## 2015-12-25 NOTE — [facility] Group Notes (Signed)
[facility] Group Notes:  (Nursing/MHT/Case Management/Adjunct)  Date:  12/25/2015  Time:  1:46 AM  Type of Therapy:  Psychoeducational Skills  Participation Level:  Did Not Attend  Participation Quality:  Admit. after hour  Affect:    Summary of Progress/Problems:  Roger [name] 12/25/2015, 1:46 AM

## 2015-12-25 NOTE — Progress Notes (Signed)
D:  Patient is pleasant and cooperative.  Per self inventory rates depression as a 5 and anxiety and hopelessness as a 10/10. Denies any SI at this time.   Medication compliant.  Visible in the milieu.  Maintaining personal care chores.  Verbalized goal is to stay positive and going home so that he can move on with his life. A: Scheduled medications given.  Support and encouragement offered.  Q 15 minute rounding maintained. R:  Medication compliant.  Safety maintained.

## 2015-12-25 NOTE — Plan of Care (Signed)
Problem: Coping: Goal: Ability to verbalize feelings will improve Outcome: Not Progressing eluded to having homicidal thoughts.  When questioned, laughs inappropriately and states "I don't want to talk about it"
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
## 2015-12-25 NOTE — Progress Notes (Signed)
This is a 25 y.o. Single AA male, who presented to the ED from [facility] under involuntary commitment for c/o having suicidal thoughts and with thoughts of harming his biological mother; and secondary to relationship issues with her and his grandmother; he recently quit his job; is currently homeless; and is sleeping in his car; and patient states, "I feel lost."

## 2015-12-25 NOTE — Plan of Care (Signed)
Problem: Safety: Goal: Ability to disclose and discuss suicidal ideas will improve Outcome: Progressing Pt agrees to notify staff with any negative feelings

## 2015-12-25 NOTE — BHH Suicide Risk Assessment (Signed)
[facility]   Nursing information obtained from:  Patient Demographic factors:  Male, Living alone Current Mental Status:  Suicidal ideation indicated by patient Loss Factors:  Loss of significant relationship Historical Factors:    Risk Reduction Factors:     Total Time spent with patient: 1 hour Principal Problem: Severe recurrent major depression without psychotic features [facility](HCC) Diagnosis:   Patient Active Problem List   Diagnosis Date Noted  . Adjustment disorder with mixed disturbance of emotions and conduct [F43.25] 12/23/2015  . Tobacco use disorder [F17.200] 11/25/2014  . Alcohol use disorder, severe, dependence (HCC) [F10.20] 11/25/2014  . Cannabis use disorder, severe, dependence (HCC) [F12.20] 11/25/2014  . 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 Opioid use disorder, moderate, dependence (HCC) [F11.20] 11/25/2014  . 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 Severe recurrent major depression without psychotic features (HCC) [F33.2] 11/24/2014   Subjective Data: Depression, suicidal and homicidal ideation, substance abuse.  Continued Clinical Symptoms:    The "Alcohol Use Disorders Identification Test", Guidelines for Use in Primary Care, Second Edition.  World Science writerHealth Organization [facility](WHO). Score between 0-7:  no or low risk or alcohol related problems. Score between 8-15:  moderate risk of alcohol related problems. Score between 16-19:  high risk of alcohol related problems. Score 20 or above:  warrants further diagnostic evaluation for alcohol dependence and treatment.   CLINICAL FACTORS:   Severe Anxiety and/or Agitation Depression:   Comorbid alcohol abuse/dependence Impulsivity Insomnia Alcohol/Substance Abuse/Dependencies   Musculoskeletal: Strength & Muscle Tone: within normal limits Gait & Station: normal Patient leans:  N/A  Psychiatric Specialty Exam: Physical Exam  Nursing note and vitals reviewed.   Review of Systems  Psychiatric/Behavioral: Positive for depression and suicidal ideas. The patient is nervous/anxious and has insomnia.   All other systems reviewed and are negative.   Blood pressure 121/70, pulse 78, temperature 98.1 F (36.7 C), temperature source Oral, resp. rate 20, height 5\' 6"  (1.676 m), weight 57.2 kg (126 lb), SpO2 100 %.Body mass index is 20.34 kg/m.  General Appearance: Fairly Groomed  Eye Contact:  Good  Speech:  Clear and Coherent  Volume:  Normal  Mood:  Depressed  Affect:  Blunt  Thought Process:  Goal Directed  Orientation:  Full (Time, Place, and Person)  Thought Content:  WDL  Suicidal Thoughts:  Yes.  with intent/plan  Homicidal Thoughts:  Yes.  without intent/plan  Memory:  Immediate;   Fair Recent;   Fair Remote;   Fair  Judgement:  Poor  Insight:  Shallow  Psychomotor Activity:  Normal  Concentration:  Concentration: Fair and Attention Span: Fair  Recall:  FiservFair  Fund of Knowledge:  Fair  Language:  Fair  Akathisia:  No  Handed:  Right  AIMS (if indicated):     Assets:  Communication Skills Desire for Improvement Physical Health Resilience Social Support  ADL's:  Intact  Cognition:  WNL  Sleep:  Number of Hours: 4.15      COGNITIVE FEATURES THAT CONTRIBUTE TO RISK:  None    SUICIDE RISK:   Moderate:  Frequent suicidal ideation with limited intensity, and duration, some specificity in terms of plans, no associated intent, good self-control, limited dysphoria/symptomatology, some risk factors present, and identifiable protective factors, including available and accessible social support.   PLAN OF CARE: Hospital admission, medication management, substance abuse counseling, discharge planning.  Roger [name] is a 25 year old male with a history  of depression admitted for suicidal and homicidal threats.  1. Suicidal ideation. The patient is  able to contract for safety in the hospital.  2. Mood. He was restarted on Prozac.  3. Insomnia. Trazodone is available.  4. Smoking. Nicotine patch is available.  5. Alcohol abuse. He denies symptoms of withdrawal will continue to monitor.  6. Substance abuse. The patient minimizes his problems and declines treatment.   7. Metabolic syndrome monitoring. Lipid profile and TSH are normal.   8. Disposition. He will be discharged back to his car. He will follow up with [facility].  I certify that inpatient services furnished can reasonably be expected to improve the patient's condition.  Kristine Linea, MD 12/25/2015, 2:18 PM
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
## 2015-12-26 LAB — PROLACTIN: PROLACTIN: 25.5 ng/mL — AB (ref 4.0–15.2)

## 2015-12-26 MED ORDER — BENZTROPINE MESYLATE 1 MG PO TABS
1.0000 mg | ORAL_TABLET | Freq: Two times a day (BID) | ORAL | Status: DC
  Administered 2015-12-26: 1 mg via ORAL
  Filled 2015-12-26 (×2): qty 1

## 2015-12-26 MED ORDER — BENZTROPINE MESYLATE 1 MG PO TABS
1.0000 mg | ORAL_TABLET | Freq: Two times a day (BID) | ORAL | Status: DC
  Filled 2015-12-26 (×2): qty 1

## 2015-12-26 NOTE — Progress Notes (Signed)
Patient is alert and oriented x 4; has been on the telephone most of the evening; with making and receiving calls; states that, he is still depressed and sad at times; has been isolative to his room; except for making and receiving calls; continues on 15 min. Monitoring.

## 2015-12-26 NOTE — Progress Notes (Signed)
[facility] MD Progress Note  12/26/2015 3:33 PM MAT STUARD  MRN:  [id number]  Subjective:  Roger [name] states he is depressed and suicidal. He still has violent thoughts towards his mother. He accepts medications and tolerates them well. He is not interested in any medication adjustments. There are no somatic complaints.  Principal Problem: Severe recurrent major depression without psychotic features (HCC) Diagnosis:   Patient Active Problem List   Diagnosis Date Noted  . Adjustment disorder with mixed disturbance of emotions and conduct [F43.25] 12/23/2015  . Tobacco use disorder [F17.200] 11/25/2014  . Alcohol use disorder, severe, dependence (HCC) [F10.20] 11/25/2014  . Cannabis use disorder, severe, dependence (HCC) [F12.20] 11/25/2014  . 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 Opioid use disorder, moderate, dependence (HCC) [F11.20] 11/25/2014  . 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 Severe recurrent major depression without psychotic features (HCC) [F33.2] 11/24/2014   Total Time spent with patient: 20 minutes  Past Psychiatric History: Depression, mood instability, substance abuse.  Past Medical History:  Past Medical History:  Diagnosis Date  . Headache     Past Surgical History:  Procedure Laterality Date  . FEMUR FRACTURE SURGERY     Family History:  Family History  Problem Relation Age of Onset  . Alcohol abuse Father    Family Psychiatric  History: Bipolar disorder. Social History:  History  Alcohol Use  . 6.0 oz/week  . 3 Cans of beer, 7 Shots of liquor per week    Comment: last drink 11/22/2014     History  Drug Use  . Types: Amphetamines, Cocaine, Marijuana, Benzodiazepines, MDMA (Ecstacy), Hydrocodone    Social History   Social History  . Marital status: Single    Spouse name: N/A  . Number of children: N/A  . Years of education: N/A    Social History Main Topics  . Smoking status: Current Every Day Smoker    Packs/day: 1.00    Years: 10.00    Types: Cigarettes    Last attempt to quit: 11/24/2014  . Smokeless tobacco: Former Neurosurgeon    Quit date: 11/24/2014  . Alcohol use 6.0 oz/week    3 Cans of beer, 7 Shots of liquor per week     Comment: last drink 11/22/2014  . Drug use:     Types: Amphetamines, Cocaine, Marijuana, Benzodiazepines, MDMA (Ecstacy), Hydrocodone  . Sexual activity: Yes    Birth control/ protection: None   Other Topics Concern  . None   Social History Narrative  . None   Additional Social History:                         Sleep: Fair  Appetite:  Fair  Current Medications: Current Facility-Administered Medications  Medication Dose Route Frequency Provider Last Rate Last Dose  . acetaminophen (TYLENOL) tablet 650 mg  650 mg Oral Q6H PRN Audery Amel, MD      . alum & mag hydroxide-simeth (MAALOX/MYLANTA) 200-200-20 MG/5ML suspension 30 mL  30 mL Oral Q4H PRN Audery Amel, MD      . FLUoxetine (PROZAC) capsule 20 mg  20 mg Oral Daily Audery Amel, MD   20 mg at 12/26/15 0829  . haloperidol (HALDOL) tablet 1 mg  1 mg Oral BID Audery Amel, MD   1 mg at 12/26/15 0830  . magnesium hydroxide (MILK OF MAGNESIA) suspension 30 mL  30 mL  Oral Daily PRN Audery Amel, MD      . nicotine (NICODERM CQ - dosed in mg/24 hours) patch 21 mg  21 mg Transdermal Daily Romelle Reiley B Malka Bocek, MD   21 mg at 12/26/15 0830    Lab Results:  Results for orders placed or performed during the hospital encounter of 12/24/15 (from the past 48 hour(s))  Hemoglobin A1c     Status: None   Collection Time: 12/25/15  6:40 AM  Result Value Ref Range   Hgb A1c MFr Bld 6.0 4.0 - 6.0 %  Lipid panel     Status: None   Collection Time: 12/25/15  6:40 AM  Result Value Ref Range   Cholesterol 153 0 - 200 mg/dL   Triglycerides 73 <161 mg/dL   HDL 58 >09 mg/dL   Total CHOL/HDL Ratio 2.6 RATIO   VLDL 15 0 - 40  mg/dL   LDL Cholesterol 80 0 - 99 mg/dL    Comment:        Total Cholesterol/HDL:CHD Risk Coronary Heart Disease Risk Table                     Men   Women  1/2 Average Risk   3.4   3.3  Average Risk       5.0   4.4  2 X Average Risk   9.6   7.1  3 X Average Risk  23.4   11.0        Use the calculated Patient Ratio above and the CHD Risk Table to determine the patient's CHD Risk.        ATP III CLASSIFICATION (LDL):  <100     mg/dL   Optimal  604-540  mg/dL   Near or Above                    Optimal  130-159  mg/dL   Borderline  981-191  mg/dL   High  >478     mg/dL   Very High   Prolactin     Status: Abnormal   Collection Time: 12/25/15  6:40 AM  Result Value Ref Range   Prolactin 25.5 (H) 4.0 - 15.2 ng/mL    Comment: (NOTE) Performed At: [facility] [address] [place], [place] [id number] Mila Homer MD MV:[id number]   TSH     Status: None   Collection Time: 12/25/15  6:40 AM  Result Value Ref Range   TSH 1.966 0.350 - 4.500 uIU/mL    Blood Alcohol level:  Lab Results  Component Value Date   ETH <5 12/23/2015   ETH <5 11/23/2014    Metabolic Disorder Labs: Lab Results  Component Value Date   HGBA1C 6.0 12/25/2015   Lab Results  Component Value Date   PROLACTIN 25.5 (H) 12/25/2015   Lab Results  Component Value Date   CHOL 153 12/25/2015   TRIG 73 12/25/2015   HDL 58 12/25/2015   CHOLHDL 2.6 12/25/2015   VLDL 15 12/25/2015   LDLCALC 80 12/25/2015    Physical Findings: AIMS:  , ,  ,  ,    CIWA:    COWS:     Musculoskeletal: Strength & Muscle Tone: within normal limits Gait & Station: normal Patient leans: N/A  Psychiatric Specialty Exam: Physical Exam  Nursing note and vitals reviewed.   Review of Systems  Psychiatric/Behavioral: Positive for depression, hallucinations, substance abuse and suicidal ideas. The patient is nervous/anxious.   All other systems  reviewed and are negative.   Blood pressure 118/72, pulse 64,  temperature 97.5 F (36.4 C), resp. rate 20, height 5\' 6"  (1.676 m), weight 57.2 kg (126 lb), SpO2 100 %.Body mass index is 20.34 kg/m.  General Appearance: Casual  Eye Contact:  Good  Speech:  Clear and Coherent  Volume:  Normal  Mood:  Depressed, Hopeless and Worthless  Affect:  Congruent  Thought Process:  Goal Directed  Orientation:  Full (Time, Place, and Person)  Thought Content:  Hallucinations: Auditory Command:  Telling him to kill his mother.  Suicidal Thoughts:  Yes.  with intent/plan  Homicidal Thoughts:  Yes.  with intent/plan  Memory:  Immediate;   Fair Recent;   Fair Remote;   Fair  Judgement:  Poor  Insight:  Lacking  Psychomotor Activity:  Normal  Concentration:  Concentration: Fair and Attention Span: Fair  Recall:  [employer]is[employer]Fair  Fund of Knowledge:  Fair  Language:  Fair  Akathisia:  No  Handed:  Right  AIMS (if indicated):     Assets:  Communication Skills Desire for Improvement Physical Health Resilience Social Support  ADL's:  Intact  Cognition:  WNL  Sleep:  Number of Hours: 8.3     Treatment Plan Summary: Daily contact with patient to assess and evaluate symptoms and progress in treatment and Medication management   Roger [name] is a 25 year old male with a history of depression admitted for suicidal and homicidal threats.  1. Suicidal ideation. The patient is able to contract for safety in the hospital.  2. Mood. He was restarted on Prozac.  3. Insomnia. Trazodone is available.  4. Smoking. Nicotine patch is available.  5. Alcohol abuse. He denies symptoms of withdrawal. Will continue to monitor.  6. Substance abuse. The patient desires long term substance abuse treatment.  7. Metabolic syndrome monitoring. Lipid profile and TSH are normal.   8. Disposition. TBE. He will be discharged back to his car or his grandmothers unless he goes to rehab. He will follow up with [facility].  Kristine LineaJolanta Adrijana Haros, MD 12/26/2015, 3:33 PM

## 2015-12-26 NOTE — [facility] Suicide Risk Assessment (Addendum)
[facility] INPATIENT:  Family/Significant Other Suicide Prevention Education  Suicide Prevention Education:  Education Completed; Ronna PolioMary Bozeman, Pt's grandmother [phone], has been identified by the patient as the family member/significant other with whom the patient will be residing, and identified as the person(s) who will aid the patient in the event of a mental health crisis (suicidal ideations/suicide attempt).  With written consent from the patient, the family member/significant other has been provided the following suicide prevention education, prior to the and/or following the discharge of the patient.  The suicide prevention education provided includes the following:  Suicide risk factors  Suicide prevention and interventions  National Suicide Hotline telephone number  [facility] assessment telephone number  [facility] Assistance 911  [facility] and/or Residential Mobile Crisis Unit telephone number  Request made of family/significant other to:  Remove weapons (e.g., guns, rifles, knives), all items previously/currently identified as safety concern.    Remove drugs/medications (over-the-counter, prescriptions, illicit drugs), all items previously/currently identified as a safety concern.  The family member/significant other verbalizes understanding of the suicide prevention education information provided.  The family member/significant other agrees to remove the items of safety concern listed above.  Elaina HoopsLauren M Carter, MSW, LCSW 12/26/2015, 12:28 PM

## 2015-12-26 NOTE — Plan of Care (Signed)
Problem: Self-Concept: Goal: Ability to verbalize positive feelings about self will improve Outcome: Not Progressing Patient isolative to his room. Flat,sad, depressed affect.

## 2015-12-26 NOTE — Progress Notes (Signed)
Patient states that he is depressed and anxious. He states his goal is to stay positive and to go home. He states that he will meet his goal by "being humble." Patient remains  isolative to his room, no interaction with peers and staff. He denies SI/HI/AVH and contracts for safety. Encouraged to verbalize thoughts/concerns/feelings to nursing staff. He verbalized understanding.

## 2015-12-26 NOTE — [facility] Group Notes (Signed)
[facility] LCSW Group Therapy Note  12/26/2015 1:00pm  Type of Therapy and Topic: Group Therapy: Holding onto Grudges   Participation Level: Minimal  Description of Group:  In this group patients will be asked to explore and define a grudge. Patients will be guided to discuss their thoughts, feelings, and behaviors as to why one holds on to grudges and reasons why people have grudges. Patients will process the impact grudges have on daily life and identify thoughts and feelings related to holding on to grudges. Facilitator will challenge patients to identify ways of letting go of grudges and the benefits once released. Patients will be confronted to address why one struggles letting go of grudges. Lastly, patients will identify feelings and thoughts related to what life would look like without grudges and actions steps that patients can take to begin to let go of the grudge. This group will be process-oriented, with patients participating in exploration of their own experiences as well as giving and receiving support and challenge from other group members.    Therapeutic Goals:  1. Patient will identify specific grudges related to their personal life.  2. Patient will identify feelings, thoughts, and beliefs around grudges.  3. Patient will identify how one releases grudges appropriately.  4. Patient will identify situations where they could have let go of the grudge, but instead chose to hold on.    Summary of Patient Progress: Pt attended discussion but did not participate; he also appeared disengaged.    Therapeutic Modalities:  Cognitive Behavioral Therapy  Solution Focused Therapy  Motivational Interviewing  Brief Therapy    Chad CordialLauren Carter, LCSWA 12/26/2015 1:04 PM

## 2015-12-26 NOTE — [facility] Group Notes (Signed)
[facility] Group Notes:  (Nursing/MHT/Case Management/Adjunct)  Date:  12/26/2015  Time:  5:31 AM  Type of Therapy:  Psychoeducational Skills  Participation Level:  Did Not Attend   Summary of Progress/Problems:  Roger MilroyLaquanda Y Katha [name] 12/26/2015, 5:31 AM

## 2015-12-27 NOTE — [facility] Group Notes (Signed)
[facility] LCSW Group Therapy   12/27/2015 1pm Type of Therapy: Group Therapy   Participation Level: Active   Participation Quality: Attentive, Sharing and Supportive   Affect: Appropriate   Cognitive: Alert and Oriented   Insight: Developing/Improving and Engaged   Engagement in Therapy: Developing/Improving and Engaged   Modes of Intervention: Clarification, Confrontation, Discussion, Education, Exploration,  Limit-setting, Orientation, Problem-solving, Rapport Building, Dance movement psychotherapisteality Testing, Socialization and Support   Summary of Progress/Problems: Pt identified obstacles faced currently and processed barriers involved in overcoming these obstacles. Pt identified steps necessary for overcoming these obstacles and explored motivation (internal and external) for facing these difficulties head on. Pt further identified one area of concern in their lives and chose a goal to focus on for today. Pt defined an obstacle as "something that gets in your way." Pt identified everyday life struggles as his biggest obstacle. Pt stated that in order to overcome these obstacles he has to stop using drugs and alcohol as well NA/AA meetings.  Roger [name], Roger [name], Theresia MajorsLCSWA

## 2015-12-27 NOTE — Progress Notes (Signed)
Pt presents sad, flat but brightens on approach. Pt refused haldol and cogentin this shift. Reports made his mouth crooked. Pt denies SI, HI, AVH. Endorses depression. Pt isolative to self and room. Minimal interaction with staff and peers.  Encouragement and support offered. Encouraged patient to spend more time with peers in dayroom and to attend group. Pt receptive but remains isolative to self and room, did attend 1 group. Safety maintained on unit with q 15 min checks

## 2015-12-27 NOTE — Progress Notes (Addendum)
Observed interacting with peers, jovial, bright affect, watched football game in the day room. Denied SI/HI, denied AV/H. Pt said, during bedtime medications administration, "Can you give me something to knock me out, I need Trazodone or something ..." MAR reviewed but there is no Trazodone. Disappointed and said, "I will be okay, you don't have to call the doctor for that, I will talk to the doctor tomorrow.Marland Kitchen."
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
## 2015-12-27 NOTE — Progress Notes (Signed)
Contacted MD because patient was complaining of tightening in his jaw.  Order placed for Cogentin 1 mg.

## 2015-12-27 NOTE — Progress Notes (Signed)
Recreation Therapy Notes  Date: 09.11.17 Time: 9:30 am Location: Craft Room  Group Topic: Self-expression  Goal Area(s) Addresses:  Patient will identify one color per emotion listed on wheel. Patient will verbalize benefit of using art as a means of self-expression. Patient will verbalize on emotion experienced during session. Patient will be educated on other forms of self-expression.  Behavioral Response: Attentive, Interactive  Intervention: Emotion Wheel  Activity: Patients were given an Arboriculturistmotion Wheel worksheet and instructed to pick a color for each emotion listed on the wheel.  Education: LRT educated patients on other forms of self-expression.  Education Outcome: Acknowledges education/In group clarification offered  Clinical Observations/Feedback: Patient completed activity by picking a color for each emotion. Patient contributed to group discussion by stating what colors he picked for certain emotions and what emotions he felt during group.  Jacquelynn CreeGreene,Casidee Jann M, LRT/CTRS 12/27/2015 10:21 AM

## 2015-12-27 NOTE — B[facility] Group Notes (Signed)
[facility] Group Notes:  (Nursing/MHT/Case Management/Adjunct)  Date:  12/27/2015  Time:  9:46 PM  Type of Therapy:  Group Therapy  Participation Level:  Active  Participation Quality:  Appropriate  Affect:  Appropriate  Cognitive:  Appropriate  Insight:  Appropriate  Engagement in Group:  Engaged  Modes of Intervention:  Education  Summary of Progress/Problems:  Roger MunroeClarence Melvin Tanza [name] 12/27/2015, 9:46 PM

## 2015-12-28 MED ORDER — FLUOXETINE HCL 20 MG PO CAPS: 20.0000 mg | ORAL_CAPSULE | Freq: Every day | ORAL | 0 refills | Status: DC

## 2015-12-28 MED ORDER — TRAZODONE HCL 100 MG PO TABS: 100.0000 mg | ORAL_TABLET | Freq: Every evening | ORAL | 0 refills | Status: DC | PRN

## 2015-12-28 NOTE — Plan of Care (Signed)
Problem: Safety: Goal: Periods of time without injury will increase Outcome: Progressing no PRN given, 15 minute checks maintained for safety, clinical and moral support provided, patient encouraged to continue to express feelings and demonstrate safe care. Patient remains free from harm, will continue to monitor.      

## 2015-12-28 NOTE — [facility] Suicide Risk Assessment (Signed)
[facility]   Principal Problem: Severe recurrent major depression without psychotic features [facility](HCC) Discharge Diagnoses:  Patient Active Problem List   Diagnosis Date Noted  . Adjustment disorder with mixed disturbance of emotions and conduct [F43.25] 12/23/2015  . Tobacco use disorder [F17.200] 11/25/2014  . Alcohol use disorder, severe, dependence (HCC) [F10.20] 11/25/2014  . Cannabis use disorder, severe, dependence (HCC) [F12.20] 11/25/2014  . 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 Opioid use disorder, moderate, dependence (HCC) [F11.20] 11/25/2014  . 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 Severe recurrent major depression without psychotic features (HCC) [F33.2] 11/24/2014     Psychiatric Specialty Exam: ROS  Blood pressure 132/81, pulse 64, temperature 97.8 F (36.6 C), temperature source Oral, resp. rate 20, height 5\' 6"  (1.676 m), weight 57.2 kg (126 lb), SpO2 100 %.Body mass index is 20.34 kg/m.                                                       Mental Status Per Nursing Assessment::   On Admission:  Suicidal ideation indicated by patient  Demographic Factors:  Male, Low socioeconomic status and Unemployed  Loss Factors: relational problems with mother  Historical Factors: Impulsivity  Risk Reduction Factors:   Living with another person, especially a relative  Continued Clinical Symptoms:  Alcohol/Substance Abuse/Dependencies Previous Psychiatric Diagnoses and Treatments  Cognitive Features That Contribute To Risk:  None    Suicide Risk:  Minimal: No identifiable suicidal ideation.  Patients presenting with no risk factors but with morbid ruminations; may be classified as minimal risk based on the severity of the depressive symptoms  Follow-up Information    [facility]. Go on  12/29/2015.   Why:  Please arrive to the clinic at 7:30AM for an assessment for medication management and therapy. Arrive as early as possible for prompt service. Please call [name] at [phone] for questions and assistance. Contact information: [address] [place], [place] [address] Phone #: [phone] Fax #: [phone]          Jimmy FootmanHernandez-Gonzalez,  Preciliano Castell, MD 12/28/2015, 8:53 AM
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
## 2015-12-28 NOTE — Progress Notes (Signed)
Patient with appropriate affect, cooperative behavior with meals, meds and plan of care. No SI/HI at this time. Patient states his mood is better and completes daily audit sheet with discharge as his goal. Patient meets to discharge today to grandmother's house. Unable to fill 7 day supply of meds prior to 10 am discharge. Safety maintained.

## 2015-12-28 NOTE — Progress Notes (Signed)
Discharged at this time to his elderly grandmother for transportation via her car. Verbalizes understanding rt recommended discharge plan of care. Verbalizes all his belongings have been returned. Safety maintained.

## 2015-12-28 NOTE — Progress Notes (Signed)
  [facility]  Will you be returning to the same living situation after discharge:  Yes,  home with grandmother At discharge, do you have transportation home?: Yes,  grandmother will pick pt up. Do you have the ability to pay for your medications: No.  Release of information consent forms completed and in the chart;  Patient's signature needed at discharge.  Patient to Follow up at: Follow-up Information    [facility]. Go on 12/29/2015.   Why:  Please arrive to the clinic at 7:30AM for an assessment for medication management and therapy. Arrive as early as possible for prompt service. Please call [name] at [phone] for questions and assistance. Contact information: [address] [place], [place] [address] Phone #: [phone] Fax #: [phone]          Next level of care provider [facility]:no  Safety Planning and Suicide Prevention discussed: Yes,  SPE reviewed with pt and grandmother  Have you used any form of tobacco in the last 30 days? (Cigarettes, Smokeless Tobacco, Cigars, and/or Pipes): Yes  Has patient been referred to the Quitline?: Patient refused referral  Patient has been referred for addiction treatment: Yes - [facility].  Lynden OxfordKadijah R Peyton Spengler, MSW, LCSW-A 12/28/2015, 10:38 AM

## 2016-04-14 ENCOUNTER — Encounter: Payer: Self-pay | Admitting: Emergency Medicine

## 2016-04-14 ENCOUNTER — Encounter: Payer: Self-pay | Admitting: Psychiatry

## 2016-04-14 ENCOUNTER — Emergency Department
Admission: EM | Admit: 2016-04-14 | Discharge: 2016-04-14 | Disposition: A | Discharge status: Other Acute Inpatient Hospital | Payer: Self-pay | Attending: Emergency Medicine | Admitting: Emergency Medicine

## 2016-04-14 ENCOUNTER — Inpatient Hospital Stay
Admission: RE | Admit: 2016-04-14 | Discharge: 2016-04-17 | DRG: 885 | Disposition: A | Discharge status: Home / Self Care | Payer: No Typology Code available for payment source | Source: Intra-hospital | Attending: Psychiatry | Admitting: Psychiatry

## 2016-04-14 DIAGNOSIS — R45851 Suicidal ideations: Secondary | ICD-10-CM

## 2016-04-14 DIAGNOSIS — F332 Major depressive disorder, recurrent severe without psychotic features: Principal | ICD-10-CM | POA: Diagnosis present

## 2016-04-14 DIAGNOSIS — F17210 Nicotine dependence, cigarettes, uncomplicated: Secondary | ICD-10-CM | POA: Diagnosis present

## 2016-04-14 DIAGNOSIS — G4700 Insomnia, unspecified: Secondary | ICD-10-CM | POA: Diagnosis present

## 2016-04-14 DIAGNOSIS — F4325 Adjustment disorder with mixed disturbance of emotions and conduct: Secondary | ICD-10-CM | POA: Insufficient documentation

## 2016-04-14 DIAGNOSIS — F1220 Cannabis dependence, uncomplicated: Secondary | ICD-10-CM | POA: Diagnosis present

## 2016-04-14 DIAGNOSIS — F17200 Nicotine dependence, unspecified, uncomplicated: Secondary | ICD-10-CM | POA: Diagnosis present

## 2016-04-14 DIAGNOSIS — Z599 Problem related to housing and economic circumstances, unspecified: Secondary | ICD-10-CM

## 2016-04-14 DIAGNOSIS — F329 Major depressive disorder, single episode, unspecified: Secondary | ICD-10-CM | POA: Insufficient documentation

## 2016-04-14 DIAGNOSIS — Z915 Personal history of self-harm: Secondary | ICD-10-CM

## 2016-04-14 HISTORY — DX: Depression, unspecified: F32.A

## 2016-04-14 LAB — CBC
HCT: 43.4 % (ref 40.0–52.0)
Hemoglobin: 15.2 g/dL (ref 13.0–18.0)
MCH: 31 pg (ref 26.0–34.0)
MCHC: 35 g/dL (ref 32.0–36.0)
MCV: 88.6 fL (ref 80.0–100.0)
PLATELETS: 301 10*3/uL (ref 150–440)
RBC: 4.9 MIL/uL (ref 4.40–5.90)
RDW: 13.9 % (ref 11.5–14.5)
WBC: 7 10*3/uL (ref 3.8–10.6)

## 2016-04-14 LAB — COMPREHENSIVE METABOLIC PANEL
ALK PHOS: 64 U/L (ref 38–126)
ALT: 13 U/L — AB (ref 17–63)
AST: 21 U/L (ref 15–41)
Albumin: 4.4 g/dL (ref 3.5–5.0)
Anion gap: 6 (ref 5–15)
BILIRUBIN TOTAL: 1.2 mg/dL (ref 0.3–1.2)
BUN: 10 mg/dL (ref 6–20)
CHLORIDE: 105 mmol/L (ref 101–111)
CO2: 27 mmol/L (ref 22–32)
CREATININE: 0.83 mg/dL (ref 0.61–1.24)
Calcium: 9.1 mg/dL (ref 8.9–10.3)
GFR calc Af Amer: >60 mL/min (ref >60)
Glucose, Bld: 88 mg/dL (ref 65–99)
Potassium: 4.3 mmol/L (ref 3.5–5.1)
Sodium: 138 mmol/L (ref 135–145)
Total Protein: 7.5 g/dL (ref 6.5–8.1)

## 2016-04-14 LAB — URINE DRUG SCREEN, QUALITATIVE (ARMC ONLY)
AMPHETAMINES, UR SCREEN: NOT DETECTED
BENZODIAZEPINE, UR SCRN: NOT DETECTED
Barbiturates, Ur Screen: NOT DETECTED
Cannabinoid 50 Ng, Ur ~~LOC~~: POSITIVE — AB
Cocaine Metabolite,Ur ~~LOC~~: NOT DETECTED
MDMA (Ecstasy)Ur Screen: NOT DETECTED
METHADONE SCREEN, URINE: NOT DETECTED
OPIATE, UR SCREEN: NOT DETECTED
Phencyclidine (PCP) Ur S: NOT DETECTED
TRICYCLIC, UR SCREEN: NOT DETECTED

## 2016-04-14 LAB — ETHANOL: ALCOHOL ETHYL (B): 24 mg/dL — AB (ref <5)

## 2016-04-14 LAB — ACETAMINOPHEN LEVEL: Acetaminophen (Tylenol), Serum: <10 ug/mL — ABNORMAL LOW (ref 10–30)

## 2016-04-14 LAB — SALICYLATE LEVEL: Salicylate Lvl: <7 mg/dL (ref 2.8–30.0)

## 2016-04-14 MED ORDER — NICOTINE 21 MG/24HR TD PT24
21.0000 mg | MEDICATED_PATCH | Freq: Every day | TRANSDERMAL | Status: DC
  Filled 2016-04-14: qty 1

## 2016-04-14 MED ORDER — ALUM & MAG HYDROXIDE-SIMETH 200-200-20 MG/5ML PO SUSP: 30.0000 mL | ORAL | Status: DC | PRN

## 2016-04-14 MED ORDER — HYDROXYZINE HCL 50 MG PO TABS
50.0000 mg | ORAL_TABLET | Freq: Three times a day (TID) | ORAL | Status: DC | PRN
  Administered 2016-04-14: 50 mg via ORAL
  Filled 2016-04-14: qty 1

## 2016-04-14 MED ORDER — TRAZODONE HCL 50 MG PO TABS: 50.0000 mg | ORAL_TABLET | Freq: Every day | ORAL | Status: DC

## 2016-04-14 MED ORDER — MAGNESIUM HYDROXIDE 400 MG/5ML PO SUSP: 30.0000 mL | Freq: Every day | ORAL | Status: DC | PRN

## 2016-04-14 MED ORDER — FLUOXETINE HCL 20 MG PO CAPS: 20.0000 mg | ORAL_CAPSULE | Freq: Every day | ORAL | Status: DC

## 2016-04-14 MED ORDER — ACETAMINOPHEN 325 MG PO TABS
650.0000 mg | ORAL_TABLET | Freq: Four times a day (QID) | ORAL | Status: DC | PRN
  Administered 2016-04-16: 650 mg via ORAL
  Filled 2016-04-14: qty 2

## 2016-04-14 MED ORDER — TRAZODONE HCL 50 MG PO TABS
50.0000 mg | ORAL_TABLET | Freq: Every day | ORAL | Status: DC
  Administered 2016-04-14 – 2016-04-16 (×3): 50 mg via ORAL
  Filled 2016-04-14 (×3): qty 1

## 2016-04-14 MED ORDER — FLUOXETINE HCL 20 MG PO CAPS
20.0000 mg | ORAL_CAPSULE | Freq: Every day | ORAL | Status: DC
  Administered 2016-04-15: 20 mg via ORAL
  Filled 2016-04-14: qty 1

## 2016-04-14 NOTE — ED Provider Notes (Signed)
-----------------------------------------   4:17 PM on 04/14/2016 -----------------------------------------  The patient has been seen by psychiatry. They will be admitted to their service for further treatment. Patient's medical workup has been largely unrevealing, urine labs pending.   Minna AntisKevin Solomon Skowronek, MD 04/14/16 [phone]

## 2016-04-14 NOTE — BH Assessment (Signed)
Assessment Note  Roger [name] is an 25 y.o. male who presents to the ER via EMS due to calling 911. Patient states, he called with the hopes of a Hydrographic surveyor coming to his home. He was planning on getting shot by an Technical sales engineer. He further states, due to his plan of suicide by copy didn't work, he was going to stop eating and starve his self to death.  Patient reports he is upset with his self because he "gambled my entire check."  He also admits to abusing alcohol and cannabis.  He reports of past hospitalizations for thoughts of ending his life, due to similar situations. He was last inpatient with [facility] 12/2015.  He work as a Museum/gallery exhibitions officer and lives with his grandmother.  He denies current involvement with the legal system and with DSS. He reports of having no history of violence and aggression. He also denies HI and AV/H.  Diagnosis: Depression  Past Medical History:  Past Medical History:  Diagnosis Date  . Headache     Past Surgical History:  Procedure Laterality Date  . FEMUR FRACTURE SURGERY      Family History:  Family History  Problem Relation Age of Onset  . Alcohol abuse Father     Social History:  reports that he has been smoking Cigarettes.  He has a 5.00 pack-year smoking history. He quit smokeless tobacco use about 16 months ago. He reports that he drinks about 6.0 oz of alcohol per week . He reports that he uses drugs, including Amphetamines, Cocaine, Marijuana, Benzodiazepines, MDMA (Ecstacy), and Hydrocodone.  Additional Social History:  Alcohol / Drug Use Pain Medications: See PTA Prescriptions: See PTA Over the Counter: See PTA History of alcohol / drug use?: Yes Longest period of sobriety (when/how long): Unable to quantify Negative Consequences of Use: Personal relationships, Financial, Work / School Substance #1 Name of Substance 1: Alcohol 1 - Age of First Use: 13 1 - Amount (size/oz): "Whatever I can get." Unable to quantify 1 -  Frequency: "Two to three days a week." 1 - Duration: "For a while" Unable to quantify 1 - Last Use / Amount: 04/13/2016 Substance #2 Name of Substance 2: Cannabis 2 - Age of First Use: 13 2 - Amount (size/oz): "Whatever I can get." "For a while" Unable to quantify 2 - Frequency: "Four to five days a week" 2 - Duration: "For a while" Unable to quantify 2 - Last Use / Amount: 04/13/2016  CIWA: CIWA-Ar BP: 114/84 Pulse Rate: 83 COWS:    Allergies:  Allergies  Allergen Reactions  . Haldol [Haloperidol Lactate]     EPS sxs    Home Medications:  (Not in a hospital admission)  OB/GYN Status:  No LMP for male patient.  General Assessment Data Location of Assessment: [facility] ED TTS Assessment: In system Is this a Tele or Face-to-Face Assessment?: Face-to-Face Is this an Initial Assessment or a Re-assessment for this encounter?: Initial Assessment Marital status: Single Maiden name: n/a Is patient pregnant?: No Pregnancy Status: No Living Arrangements: Other relatives Database administrator) Can pt return to current living arrangement?: Yes Admission Status: Involuntary Is patient capable of signing voluntary admission?: No Referral Source: Self/Family/Friend Insurance type: n/a  Medical Screening Exam [facility] Walk-in ONLY) Medical Exam completed: Yes  Crisis Care Plan Living Arrangements: Other relatives Database administrator) Legal Guardian: Other: (Self) Name of Psychiatrist: Reports of none Name of Therapist: Reports of none  Education Status Is patient currently in school?: No Current Grade: n/a Highest grade  of school patient has completed: 12th Grade Name of school: n/a Contact person: n/a  Risk to self with the past 6 months Suicidal Ideation: Yes-Currently Present Has patient been a risk to self within the past 6 months prior to admission? : Yes Suicidal Intent: No Has patient had any suicidal intent within the past 6 months prior to admission? : No Is patient at risk for  suicide?: Yes Suicidal Plan?: Yes-Currently Present Has patient had any suicidal plan within the past 6 months prior to admission? : Yes Specify Current Suicidal Plan: Suicide by cop Access to Means: Yes Specify Access to Suicidal Means: n/a What has been your use of drugs/alcohol within the last 12 months?: Alcohol & Cannabis Previous Attempts/Gestures: No How many times?: 1 Other Self Harm Risks: Reports of none Triggers for Past Attempts: None known Intentional Self Injurious Behavior: None Family Suicide History: No Recent stressful life event(s): Other (Comment), Loss (Comment) (Lost job) Persecutory voices/beliefs?: No Depression: Yes Depression Symptoms: Feeling worthless/self pity, Loss of interest in usual pleasures, Guilt, Fatigue, Isolating, Tearfulness Substance abuse history and/or treatment for substance abuse?: Yes Suicide prevention information given to non-admitted patients: Not applicable  Risk to Others within the past 6 months Homicidal Ideation: No Does patient have any lifetime risk of violence toward others beyond the six months prior to admission? : No Thoughts of Harm to Others: No Current Homicidal Intent: No Current Homicidal Plan: No Access to Homicidal Means: No Identified Victim: Reports of none History of harm to others?: No Assessment of Violence: None Noted Violent Behavior Description: Reports of none Does patient have access to weapons?: No Criminal Charges Pending?: No Does patient have a court date: No Is patient on probation?: No  Psychosis Hallucinations: None noted Delusions: None noted  Mental Status Report Appearance/Hygiene: Unremarkable, In scrubs Eye Contact: Good Motor Activity: Freedom of movement, Unremarkable Speech: Logical/coherent, Unremarkable Level of Consciousness: Alert Mood: Depressed, Sad Affect: Appropriate to circumstance, Depressed, Sad Anxiety Level: Minimal Thought Processes: Coherent, Relevant Judgement:  Unimpaired Orientation: Person, Place, Time, Situation, Appropriate for developmental age Obsessive Compulsive Thoughts/Behaviors: Minimal  Cognitive Functioning Concentration: Normal Memory: Recent Intact, Remote Intact IQ: Average Insight: Poor Impulse Control: Poor Appetite: Fair Weight Loss: 0 Weight Gain: 0 Sleep: No Change Total Hours of Sleep: 8 Vegetative Symptoms: None  ADLScreening [facility] Assessment Services) Patient's cognitive ability adequate to safely complete daily activities?: Yes Patient able to express need for assistance with ADLs?: Yes Independently performs ADLs?: Yes (appropriate for developmental age)  Prior Inpatient Therapy Prior Inpatient Therapy: Yes Prior Therapy Dates: 2017 Prior Therapy Facilty/Provider(s): [facility] & [facility] Reason for Treatment: Depression  Prior Outpatient Therapy Prior Outpatient Therapy: No Prior Therapy Dates: Reports of none Prior Therapy Facilty/Provider(s): Reports of none Reason for Treatment: Reports of none Does patient have an ACCT team?: No Does patient have Intensive In-House Services?  : No Does patient have [facility] services? : No Does patient have P4CC services?: No  ADL Screening (condition at time of admission) Patient's cognitive ability adequate to safely complete daily activities?: Yes Is the patient deaf or have difficulty hearing?: No Does the patient have difficulty seeing, even when wearing glasses/contacts?: No Does the patient have difficulty concentrating, remembering, or making decisions?: No Patient able to express need for assistance with ADLs?: Yes Does the patient have difficulty dressing or bathing?: No Independently performs ADLs?: Yes (appropriate for developmental age) Does the patient have difficulty walking or climbing stairs?: No Weakness of Legs: None Weakness of Arms/Hands: None  Home Assistive Devices/Equipment Home Assistive Devices/Equipment: None  Therapy Consults (therapy  consults require a physician order) PT Evaluation Needed: No OT Evalulation Needed: No SLP Evaluation Needed: No Abuse/Neglect Assessment (Assessment to be complete while patient is alone) Physical Abuse: Denies Verbal Abuse: Denies Sexual Abuse: Denies Exploitation of patient/patient's resources: Denies Self-Neglect: Denies Values / Beliefs Cultural Requests During Hospitalization: None Spiritual Requests During Hospitalization: None Consults Spiritual Care Consult Needed: No Social Work Consult Needed: No Merchant navy officerAdvance Directives (For Healthcare) Does Patient Have a Medical Advance Directive?: No Would patient like information on creating a medical advance directive?: No - Patient declined    Additional Information 1:1 In Past 12 Months?: No CIRT Risk: No Elopement Risk: No Does patient have medical clearance?: Yes  Child/Adolescent Assessment Running Away Risk: Denies (Patient is an adult)  Disposition:  Disposition Initial Assessment Completed for this Encounter: Yes Disposition of Patient: Other dispositions (ER MD Ordered Psych Consult)  On Site Evaluation by:   Reviewed with Physician:    Lilyan Gilfordalvin J. Maekayla Giorgio MS, LCAS, LPC, NCC, CCSI Therapeutic Triage Specialist 04/14/2016 3:48 PM

## 2016-04-14 NOTE — [facility] Suicide Risk Assessment (Signed)
[facility]   Nursing information obtained from:    Demographic factors:    Current Mental Status:    Loss Factors:    Historical Factors:    Risk Reduction Factors:     Total Time spent with patient: 1 hour Principal Problem: Severe recurrent major depression without psychotic features [facility](HCC) Diagnosis:   Patient Active Problem List   Diagnosis Date Noted  . Adjustment disorder with mixed disturbance of emotions and conduct [F43.25] 12/23/2015  . Tobacco use disorder [F17.200] 11/25/2014  . Alcohol use disorder, severe, dependence (HCC) [F10.20] 11/25/2014  . Cannabis use disorder, severe, dependence (HCC) [F12.20] 11/25/2014  . 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 Opioid use disorder, moderate, dependence (HCC) [F11.20] 11/25/2014  . 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 Severe recurrent major depression without psychotic features (HCC) [F33.2] 11/24/2014   Subjective Data: Suicidal ideation.  Continued Clinical Symptoms:    The "Alcohol Use Disorders Identification Test", Guidelines for Use in Primary Care, Second Edition.  World Science writerHealth Organization [facility](WHO). Score between 0-7:  no or low risk or alcohol related problems. Score between 8-15:  moderate risk of alcohol related problems. Score between 16-19:  high risk of alcohol related problems. Score 20 or above:  warrants further diagnostic evaluation for alcohol dependence and treatment.   CLINICAL FACTORS:   Depression:   Comorbid alcohol abuse/dependence Impulsivity Alcohol/Substance Abuse/Dependencies   Musculoskeletal: Strength & Muscle Tone: within normal limits Gait & Station: normal Patient leans: N/A  Psychiatric Specialty Exam: Physical Exam  Nursing note and vitals reviewed.   Review of Systems  Psychiatric/Behavioral: Positive for depression, substance abuse  and suicidal ideas.  All other systems reviewed and are negative.   There were no vitals taken for this visit.There is no height or weight on file to calculate BMI.  General Appearance: Casual  Eye Contact:  None  Speech:  Clear and Coherent  Volume:  Decreased  Mood:  Anxious, Hopeless and Worthless  Affect:  Blunt  Thought Process:  Goal Directed and Descriptions of Associations: Intact  Orientation:  Full (Time, Place, and Person)  Thought Content:  Negative, WDL and Logical  Suicidal Thoughts:  Yes.  with intent/plan  Homicidal Thoughts:  No  Memory:  Immediate;   Fair Recent;   Fair Remote;   Fair  Judgement:  Poor  Insight:  Lacking  Psychomotor Activity:  Psychomotor Retardation  Concentration:  Concentration: Fair and Attention Span: Fair  Recall:  FiservFair  Fund of Knowledge:  Fair  Language:  Fair  Akathisia:  No  Handed:  Right  AIMS (if indicated):     Assets:  Communication Skills Desire for Improvement Housing Physical Health Resilience Social Support  ADL's:  Intact  Cognition:  WNL  Sleep:         COGNITIVE FEATURES THAT CONTRIBUTE TO RISK:  None    SUICIDE RISK:   Moderate:  Frequent suicidal ideation with limited intensity, and duration, some specificity in terms of plans, no associated intent, good self-control, limited dysphoria/symptomatology, some risk factors present, and identifiable protective factors, including available and accessible social support.   PLAN OF CARE: Hospital admission, medication management, substance abuse counseling, discharge planning.  Roger [name] is a 25 year old male with a history of depression, substance abuse, and Being admitted for suicidal ideation.  1. Suicidal ideation. The patient is able to contract for safety in the hospital.  2. Mood. We restarted  Prozac but the patient does not believe that he needs medications.  3. Insomnia. Trazodone is available.  4. Substance abuse. The patient minimizes his  problems and declines substance abuse treatment. He however is interested in treatment for gambling.  5. Smoking. Nicotine patch is available.  6. Disposition. He will be discharged back with his grandmother. He will follow up with [facility].  I certify that inpatient services furnished can reasonably be expected to improve the patient's condition.  Roger LineaJolanta Ayrabella Labombard, MD 04/14/2016, 5:49 PM

## 2016-04-14 NOTE — ED Notes (Signed)
Report called to Louanna Vanliew B RN in BHU  Pt to transfer at this time

## 2016-04-14 NOTE — Tx Team (Signed)
Initial Treatment Plan 04/14/2016 7:07 PM Roger [name] [place] [id number]    PATIENT STRESSORS: Financial difficulties Substance abuse   PATIENT STRENGTHS: Ability for insight Active sense of humor Supportive family/friends   PATIENT IDENTIFIED PROBLEMS: Depression  Polysubstance Abuse                   DISCHARGE CRITERIA:  Adequate post-discharge living arrangements Improved stabilization in mood, thinking, and/or behavior Verbal commitment to aftercare and medication compliance  PRELIMINARY DISCHARGE PLAN: Attend aftercare/continuing care group Return to previous living arrangement  PATIENT/FAMILY INVOLVEMENT: This treatment plan has been presented to and reviewed with the patient, Roger [name] Hemp, and/or family member,   The patient and family have been given the opportunity to ask questions and make suggestions.  Dagoberto LigasJennifer S Wing Schoch, RN 04/14/2016, 7:07 PM

## 2016-04-14 NOTE — ED Notes (Signed)
Pt informed of pending transfer to BMU. Pt accepting. VS taken / stable. Maintained on 15 minute checks and observation by security camera for safety.

## 2016-04-14 NOTE — ED Notes (Signed)
Pt to be transferred to BMU. All belongings will be sent with pt. Report called to Candise BowensJen, Charity fundraiserN. Pt IVC.

## 2016-04-14 NOTE — ED Notes (Signed)
Pt will be transferred to BMU, room 307 per TTS.

## 2016-04-14 NOTE — ED Notes (Signed)
[phone]
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
## 2016-04-14 NOTE — ED Triage Notes (Signed)
Pt arrived with EMS from home with thoughts of harming himself with a plan. Pt has hx of Bipolar as well as a gambling problem and becomes depressed and suicidal when he starts losing. Pt reports he has been feeling this way for "a while" Pt has blunted affect in triage. Pt denies any HI.  Has been hospitalized for psychiatric problems in the past. Pt here voluntarily.

## 2016-04-14 NOTE — ED Provider Notes (Signed)
Time Seen: Approximately 1139  I have reviewed the triage notes  Chief Complaint: Depression and Suicidal   History of Present Illness: Roger [name] is a 25 y.o. male who presents with suicidal thoughts. Patient states he's been feeling this way for "" while "". Patient states that he had a plan to initially to be shot by the police. He states he's changed his plan now since he is here for starvation. Patient has history of polysubstance abuse and gambling issues and states he becomes depressed and suicidal and every loses gambling. He states he has a psychiatrist locally and states he has not been taking his prescribed medication which includes Prozac and Desyrel.  Past Medical History:  Diagnosis Date  . Headache     Patient Active Problem List   Diagnosis Date Noted  . Adjustment disorder with mixed disturbance of emotions and conduct 12/23/2015  . Tobacco use disorder 11/25/2014  . Alcohol use disorder, severe, dependence ([facility]) 11/25/2014  . Cannabis use disorder, severe, dependence ([facility]) 11/25/2014  . 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 Opioid use disorder, moderate, dependence ([facility]) 11/25/2014  . 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 Severe recurrent major depression without psychotic features ([facility]) 11/24/2014    Past Surgical History:  Procedure Laterality Date  . FEMUR FRACTURE SURGERY      Past Surgical History:  Procedure Laterality Date  . FEMUR FRACTURE SURGERY      Current Outpatient Rx  . Order #: [id number] Class: Print  . Order #: [id number] Class: Print    Allergies:  Haldol [haloperidol lactate]  Family History: Family History  Problem Relation Age of Onset  . Alcohol abuse Father     Social History: Social History  Substance Use Topics  . Smoking status: Current Every Day Smoker    Packs/day: 0.50    Years: 10.00    Types: Cigarettes  . Smokeless  tobacco: Former NeurosurgeonUser    Quit date: 11/24/2014  . Alcohol use 6.0 oz/week    3 Cans of beer, 7 Shots of liquor per week     Review of Systems:   10 point review of systems was performed and was otherwise negative: Patient denies any current physical complaints Constitutional: No fever Eyes: No visual disturbances ENT: No sore throat, ear pain Cardiac: No chest pain Respiratory: No shortness of breath, wheezing, or stridor Abdomen: No abdominal pain, no vomiting, No diarrhea Endocrine: No weight loss, No night sweats Extremities: No peripheral edema, cyanosis Skin: No rashes, easy bruising Neurologic: No focal weakness, trouble with speech or swollowing Urologic: No dysuria, Hematuria, or urinary frequency   Physical Exam:  ED Triage Vitals  Enc Vitals Group     BP 04/14/16 1045 114/84     Pulse Rate 04/14/16 1045 83     Resp 04/14/16 1045 18     Temp 04/14/16 1045 98 F (36.7 C)     Temp Source 04/14/16 1045 Oral     SpO2 04/14/16 1045 100 %     Weight 04/14/16 1045 125 lb (56.7 kg)     Height 04/14/16 1045 5\' 6"  (1.676 m)     Head Circumference --      Peak Flow --      Pain Score 04/14/16 1053 0     Pain Loc --      Pain Edu? --      Excl. in GC? --     General: Awake , Alert ,  and Oriented times 3; GCS 15 Head: Normal cephalic , atraumatic Eyes: Pupils equal , round, reactive to light Nose/Throat: No nasal drainage, patent upper airway without erythema or exudate.  Neck: Supple, Full range of motion, No anterior adenopathy or palpable thyroid masses Lungs: Clear to ascultation without wheezes , rhonchi, or rales Heart: Regular rate, regular rhythm without murmurs , gallops , or rubs Abdomen: Soft, non tender without rebound, guarding , or rigidity; bowel sounds positive and symmetric in all 4 quadrants. No organomegaly .        Extremities: 2 plus symmetric pulses. No edema, clubbing or cyanosis Neurologic: normal ambulation, Motor symmetric without deficits,  sensory intact Skin: warm, dry, no rashes   Labs:   All laboratory work was reviewed including any pertinent negatives or positives listed below:  Labs Reviewed  COMPREHENSIVE METABOLIC PANEL - Abnormal; Notable for the following:       Result Value   ALT 13 (*)    All other components within normal limits  CBC  ETHANOL  SALICYLATE LEVEL  ACETAMINOPHEN LEVEL  URINE DRUG SCREEN, QUALITATIVE ([facility] ONLY)       ED Course:  I established that the patient is actively suicidal at this time and I felt required involuntary commitment. Paperwork will be filled out and [facility] and also [facility]. Patient's currently cleared from a medical standpoint pending his laboratory work. Clinical Course      Assessment: * Suicidal ideation History of polysubstance abuse    Plan:  Involuntary commitment            Jennye MoccasinBrian S Aylen Stradford, MD 04/14/16 1149

## 2016-04-14 NOTE — Progress Notes (Signed)
Patient is a new admit. Oriented to unit, staff, and milieu. Skin assessment completed with no issues. Searched for contraband and none found.  Deneis HI/AVH. Passive SI, but does verbally contract for safety. Did not voice any complaints.On q15 minute checks for safety. Will continue to monitor.

## 2016-04-14 NOTE — BH Assessment (Signed)
[facility] and [facility]  [facility] Attending Phyiscian (Dr. Jennet MaduroPucilowska) and they will put in the admission orders  Attending Physician will be Dr. Jennet MaduroPucilowska.   Patient has been [facility] to room 307, [facility] Nurse BaldwinPhyllis.   Intake Paper Work has been signed and placed on patient chart.  ER staff is aware of the admission (Dr. Lenard LancePaduchowski, ER MD; Vic RipperAmy B., Patient's Nurse & Lucia Bitterlarrisa, Patient Access).

## 2016-04-14 NOTE — ED Notes (Signed)
Pt refused his Prozac. Pt stated, "Nah, I don't want that."

## 2016-04-14 NOTE — ED Notes (Signed)
BEHAVIORAL HEALTH ROUNDING Patient sleeping: No. Patient alert and oriented: yes Behavior appropriate: Yes.  ; If no, describe:  Nutrition and fluids offered: yes Toileting and hygiene offered: Yes  Sitter present: q15 minute observations and security  monitoring Law enforcement present: Yes  ODS  

## 2016-04-14 NOTE — ED Notes (Signed)

## 2016-04-14 NOTE — ED Notes (Addendum)
Pt to triage area with this tech to obtain VS, pt states to this tech " I want to die, I'm really going to go through with it this time" this tech asks pt if he wants to harm anyone else and he states "No I just want a officer to kill me but they didn't show up today the ambulance did so now I'm just going to starve myself to death, I changed my plan now to dying of starvation"

## 2016-04-14 NOTE — ED Notes (Signed)
Pt calm, cooperative. Pt sitting on bed with head in hands upon approach. Pt endorses SI. Denies HI and AVH. Pt told RN he has a gambling problem and begins to feel suicidal when losing.  Pt stated he has been hospitalized for similar issues in the past.  No concerns voiced. Maintained on 15 minute checks and observation by security camera for safety.

## 2016-04-15 MED ORDER — FLUVOXAMINE MALEATE 50 MG PO TABS
50.0000 mg | ORAL_TABLET | Freq: Every day | ORAL | Status: DC
  Administered 2016-04-15: 50 mg via ORAL
  Filled 2016-04-15: qty 1

## 2016-04-15 NOTE — Progress Notes (Signed)
[facility] MD Progress Note  04/15/2016 2:52 PM Roger [name]  MRN:  [id number]  Subjective:    12/30 Mr. Puliam is in bed today. He did eat breakfast and lunch so he is not starving himself to death. His mood is depressed with no affect. He is still suicidal. He does not participate in programming. There are no somatic complaints. UDS is positive for cannabis only.  Per nursing: Endorses Passive SI. Contracts for safety. UDS obtained. Pt cooperative with care and medications. Pleasant during interaction. Visible in dayroom with minimal interaction. Denies pain. Voices no additional concerns at this time. Safety maintained with continue to monitor.  Principal Problem: Severe recurrent major depression without psychotic features (HCC) Diagnosis:   Patient Active Problem List   Diagnosis Date Noted  . Bipolar I disorder, most recent episode depressed (HCC) [F31.30] 04/14/2016  . Adjustment disorder with mixed disturbance of emotions and conduct [F43.25] 12/23/2015  . Tobacco use disorder [F17.200] 11/25/2014  . Alcohol use disorder, severe, dependence (HCC) [F10.20] 11/25/2014  . Cannabis use disorder, severe, dependence (HCC) [F12.20] 11/25/2014  . 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 Opioid use disorder, moderate, dependence (HCC) [F11.20] 11/25/2014  . 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 Severe recurrent major depression without psychotic features (HCC) [F33.2] 11/24/2014   Total Time spent with patient: 20 minutes  Past Psychiatric History: depression, substance abse.   Past Medical History:  Past Medical History:  Diagnosis Date  . Depression   . Headache     Past Surgical History:  Procedure Laterality Date  . FEMUR FRACTURE SURGERY     Family History:  Family History  Problem Relation Age of Onset  . Alcohol abuse Father    Family Psychiatric   History: See H&P. Social History:  History  Alcohol Use  . 6.0 oz/week  . 3 Cans of beer, 7 Shots of liquor per week     History  Drug Use  . Types: Amphetamines, Cocaine, Marijuana, Benzodiazepines, MDMA (Ecstacy), Hydrocodone    Social History   Social History  . Marital status: Single    Spouse name: N/A  . Number of children: N/A  . Years of education: N/A   Social History Main Topics  . Smoking status: Current Every Day Smoker    Packs/day: 0.50    Years: 10.00    Types: Cigarettes  . Smokeless tobacco: Former Neurosurgeon    Quit date: 11/24/2014  . Alcohol use 6.0 oz/week    3 Cans of beer, 7 Shots of liquor per week  . Drug use:     Types: Amphetamines, Cocaine, Marijuana, Benzodiazepines, MDMA (Ecstacy), Hydrocodone  . Sexual activity: Yes    Birth control/ protection: None   Other Topics Concern  . None   Social History Narrative  . None   Additional Social History:    History of alcohol / drug use?: Yes                    Sleep: Fair  Appetite:  Fair  Current Medications: Current Facility-Administered Medications  Medication Dose Route Frequency Provider Last Rate Last Dose  . acetaminophen (TYLENOL) tablet 650 mg  650 mg Oral Q6H PRN Jolanta B Pucilowska, MD      . alum & mag hydroxide-simeth (MAALOX/MYLANTA) 200-200-20 MG/5ML suspension 30 mL  30 mL Oral Q4H PRN Jolanta B Pucilowska, MD      . FLUoxetine (PROZAC) capsule 20  mg  20 mg Oral Daily Jolanta B Pucilowska, MD   20 mg at 04/15/16 0840  . hydrOXYzine (ATARAX/VISTARIL) tablet 50 mg  50 mg Oral TID PRN Shari ProwsJolanta B Pucilowska, MD   50 mg at 04/14/16 2134  . magnesium hydroxide (MILK OF MAGNESIA) suspension 30 mL  30 mL Oral Daily PRN Jolanta B Pucilowska, MD      . nicotine (NICODERM CQ - dosed in mg/24 hours) patch 21 mg  21 mg Transdermal Q0600 Jolanta B Pucilowska, MD      . traZODone (DESYREL) tablet 50 mg  50 mg Oral QHS Shari ProwsJolanta B Pucilowska, MD   50 mg at 04/14/16 2134    Lab Results:   Results for orders placed or performed during the hospital encounter of 04/14/16 (from the past 48 hour(s))  Urine Drug Screen, Qualitative     Status: Abnormal   Collection Time: 04/14/16  9:45 PM  Result Value Ref Range   Tricyclic, Ur Screen NONE DETECTED NONE DETECTED   Amphetamines, Ur Screen NONE DETECTED NONE DETECTED   MDMA (Ecstasy)Ur Screen NONE DETECTED NONE DETECTED   Cocaine Metabolite,Ur [place] NONE DETECTED NONE DETECTED   Opiate, Ur Screen NONE DETECTED NONE DETECTED   Phencyclidine (PCP) Ur S NONE DETECTED NONE DETECTED   Cannabinoid 50 Ng, Ur [place] POSITIVE (A) NONE DETECTED   Barbiturates, Ur Screen NONE DETECTED NONE DETECTED   Benzodiazepine, Ur Scrn NONE DETECTED NONE DETECTED   Methadone Scn, Ur NONE DETECTED NONE DETECTED    Comment: (NOTE) 100  Tricyclics, urine               Cutoff 1000 ng/mL 200  Amphetamines, urine             Cutoff 1000 ng/mL 300  MDMA (Ecstasy), urine           Cutoff 500 ng/mL 400  Cocaine Metabolite, urine       Cutoff 300 ng/mL 500  Opiate, urine                   Cutoff 300 ng/mL 600  Phencyclidine (PCP), urine      Cutoff 25 ng/mL 700  Cannabinoid, urine              Cutoff 50 ng/mL 800  Barbiturates, urine             Cutoff 200 ng/mL 900  Benzodiazepine, urine           Cutoff 200 ng/mL 1000 Methadone, urine                Cutoff 300 ng/mL 1100 1200 The urine drug screen provides only a preliminary, unconfirmed 1300 analytical test result and should not be used for non-medical 1400 purposes. Clinical consideration and professional judgment should 1500 be applied to any positive drug screen result due to possible 1600 interfering substances. A more specific alternate chemical method 1700 must be used in order to obtain a confirmed analytical result.  1800 Gas chromato graphy / mass spectrometry (GC/MS) is the preferred 1900 confirmatory method.     Blood Alcohol level:  Lab Results  Component Value Date   ETH 24 (H) 04/14/2016    ETH <5 12/23/2015    Metabolic Disorder Labs: Lab Results  Component Value Date   HGBA1C 6.0 12/25/2015   Lab Results  Component Value Date   PROLACTIN 25.5 (H) 12/25/2015   Lab Results  Component Value Date   CHOL 153 12/25/2015   TRIG 73 12/25/2015  HDL 58 12/25/2015   CHOLHDL 2.6 12/25/2015   VLDL 15 12/25/2015   LDLCALC 80 12/25/2015    Physical Findings: AIMS:  , ,  ,  ,    CIWA:    COWS:     Musculoskeletal: Strength & Muscle Tone: within normal limits Gait & Station: normal Patient leans: N/A  Psychiatric Specialty Exam: Physical Exam  Nursing note and vitals reviewed.   Review of Systems  Psychiatric/Behavioral: Positive for depression, substance abuse and suicidal ideas.  All other systems reviewed and are negative.   Blood pressure 110/61, pulse (!) 48, temperature 98.2 F (36.8 C), resp. rate 18, height 5\' 6"  (1.676 m), weight 57.2 kg (126 lb), SpO2 100 %.Body mass index is 20.34 kg/m.  General Appearance: Casual  Eye Contact:  Poor  Speech:  Clear and Coherent  Volume:  Normal  Mood:  Depressed, Hopeless and Worthless  Affect:  Flat  Thought Process:  Goal Directed and Descriptions of Associations: Intact  Orientation:  Full (Time, Place, and Person)  Thought Content:  WDL  Suicidal Thoughts:  Yes.  with intent/plan  Homicidal Thoughts:  No  Memory:  Immediate;   Fair Recent;   Fair Remote;   Fair  Judgement:  Poor  Insight:  Lacking  Psychomotor Activity:  Decreased  Concentration:  Concentration: Fair and Attention Span: Fair  Recall:  [employer]Fair  Fund of Knowledge:  Fair  Language:  Fair  Akathisia:  No  Handed:  Right  AIMS (if indicated):     Assets:  Communication Skills Desire for Improvement Housing Physical Health Resilience Social Support  ADL's:  Intact  Cognition:  WNL  Sleep:  Number of Hours: 4.45     Treatment Plan Summary: Daily contact with patient to assess and evaluate symptoms and progress in treatment and  Medication management   [name] is a 25 year old male with a history of depression, substance abuse, and Being admitted for suicidal ideation.  1. Suicidal ideation. The patient is able to contract for safety in the hospital.  2. Mood. We restarted Prozac but the patient does not believe that he needs medications.  3. Insomnia. Trazodone is available.  4. Substance abuse. The patient minimizes his problems and declines substance abuse treatment. He however is interested in treatment for gambling. We will try Luvox.  5. Smoking. Nicotine patch is available.  6. Disposition. He will be discharged back with his grandmother. He will follow up with [facility].    Kristine LineaJolanta Pucilowska, MD 04/15/2016, 2:52 PM

## 2016-04-15 NOTE — Progress Notes (Signed)
Endorses Passive SI. Contracts for safety. UDS obtained. Pt cooperative with care and medications. Pleasant during interaction. Visible in dayroom with minimal interaction. Denies pain. Voices no additional concerns at this time. Safety maintained with continue to monitor.

## 2016-04-15 NOTE — [facility] Group Notes (Signed)
[facility] Group Notes:  (Nursing/MHT/Case Management/Adjunct)  Date:  04/15/2016  Time:  5:53 AM  Type of Therapy:  Psychoeducational Skills  Participation Level:  Active  Participation Quality:  Attentive  Affect:  Appropriate  Cognitive:  Appropriate  Insight:  Appropriate  Engagement in Group:  Limited  Modes of Intervention:  Discussion, Socialization and Support  Summary of Progress/Problems:  Roger MilroyLaquanda [name] Roger [name] 04/15/2016, 5:53 AM

## 2016-04-15 NOTE — Progress Notes (Signed)
Pt has been seclusive to his room. Pt continues to endorse passive SI. Pt's mood and affect has been depressed. Pt completed his self inventory form: Pt rated his depression 5/10, His haplessness 0/10 and his anxiety 0/10. Pt states his appetite is good although he did not breakfast or lunch. Pt denies A/V hallucinations . Pt is able to contract for safety.

## 2016-04-15 NOTE — [facility] Group Notes (Signed)
Patient did not attend therapeutic group  Tzipora Mcinroy LCSW [phone]  
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
## 2016-04-16 MED ORDER — FLUVOXAMINE MALEATE 50 MG PO TABS
100.0000 mg | ORAL_TABLET | Freq: Every day | ORAL | Status: DC
Start: 2016-04-16 — End: 2016-04-17
  Administered 2016-04-16: 100 mg via ORAL
  Filled 2016-04-16: qty 2

## 2016-04-16 NOTE — Progress Notes (Signed)
Pt endorses passive SI without plan. Affect is depressed and withdrawn. Guarded and forwards little at this time. Visible in milieu watching tv with no interaction. Pt cooperative with unit routine and medications. Safety maintained. Will continue to monitor.

## 2016-04-16 NOTE — Progress Notes (Signed)
[facility] MD Progress Note  04/16/2016 1:00 PM Roger [name]  MRN:  [id number]  Subjective:    12/30 Roger [name] is in bed today. He did eat breakfast and lunch so he is not starving himself to death. His mood is depressed with no affect. He is still suicidal. He does not participate in programming. There are no somatic complaints. UDS is positive for cannabis only.  12/31 Roger [name] is depressed, suicidal, with severe psychomotor retardation. He reports that his appetite is fair. We started Luvox last night in hopes to address gambling problem. There are no somatic symptoms. The patient remains isolative in his room. He hardly gets out of bed.  Per nursing: Pt endorses passive SI without plan. Affect is depressed and withdrawn. Guarded and forwards little at this time. Visible in milieu watching tv with no interaction. Pt cooperative with unit routine and medications. Safety maintained. Will continue to monitor.  Principal Problem: Severe recurrent major depression without psychotic features (HCC) Diagnosis:   Patient Active Problem List   Diagnosis Date Noted  . Bipolar I disorder, most recent episode depressed (HCC) [F31.30] 04/14/2016  . Adjustment disorder with mixed disturbance of emotions and conduct [F43.25] 12/23/2015  . Tobacco use disorder [F17.200] 11/25/2014  . Alcohol use disorder, severe, dependence (HCC) [F10.20] 11/25/2014  . Cannabis use disorder, severe, dependence (HCC) [F12.20] 11/25/2014  . 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 Opioid use disorder, moderate, dependence (HCC) [F11.20] 11/25/2014  . 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 Severe recurrent major depression without psychotic features (HCC) [F33.2] 11/24/2014   Total Time spent with patient: 20 minutes  Past Psychiatric History: depression, substance abse.   Past Medical History:  Past  Medical History:  Diagnosis Date  . Depression   . Headache     Past Surgical History:  Procedure Laterality Date  . FEMUR FRACTURE SURGERY     Family History:  Family History  Problem Relation Age of Onset  . Alcohol abuse Father    Family Psychiatric  History: See H&P. Social History:  History  Alcohol Use  . 6.0 oz/week  . 3 Cans of beer, 7 Shots of liquor per week     History  Drug Use  . Types: Amphetamines, Cocaine, Marijuana, Benzodiazepines, MDMA (Ecstacy), Hydrocodone    Social History   Social History  . Marital status: Single    Spouse name: N/A  . Number of children: N/A  . Years of education: N/A   Social History Main Topics  . Smoking status: Current Every Day Smoker    Packs/day: 0.50    Years: 10.00    Types: Cigarettes  . Smokeless tobacco: Former Neurosurgeon    Quit date: 11/24/2014  . Alcohol use 6.0 oz/week    3 Cans of beer, 7 Shots of liquor per week  . Drug use:     Types: Amphetamines, Cocaine, Marijuana, Benzodiazepines, MDMA (Ecstacy), Hydrocodone  . Sexual activity: Yes    Birth control/ protection: None   Other Topics Concern  . None   Social History Narrative  . None   Additional Social History:    History of alcohol / drug use?: Yes                    Sleep: Fair  Appetite:  Fair  Current Medications: Current Facility-Administered Medications  Medication Dose Route Frequency Provider Last Rate Last Dose  . acetaminophen (TYLENOL) tablet  650 mg  650 mg Oral Q6H PRN Jolanta B Pucilowska, MD      . alum & mag hydroxide-simeth (MAALOX/MYLANTA) 200-200-20 MG/5ML suspension 30 mL  30 mL Oral Q4H PRN Jolanta B Pucilowska, MD      . fluvoxaMINE (LUVOX) tablet 100 mg  100 mg Oral QHS Jolanta B Pucilowska, MD      . hydrOXYzine (ATARAX/VISTARIL) tablet 50 mg  50 mg Oral TID PRN Shari ProwsJolanta B Pucilowska, MD   50 mg at 04/14/16 2134  . magnesium hydroxide (MILK OF MAGNESIA) suspension 30 mL  30 mL Oral Daily PRN Jolanta B Pucilowska,  MD      . nicotine (NICODERM CQ - dosed in mg/24 hours) patch 21 mg  21 mg Transdermal Q0600 Jolanta B Pucilowska, MD      . traZODone (DESYREL) tablet 50 mg  50 mg Oral QHS Shari ProwsJolanta B Pucilowska, MD   50 mg at 04/15/16 2214    Lab Results:  Results for orders placed or performed during the hospital encounter of 04/14/16 (from the past 48 hour(s))  Urine Drug Screen, Qualitative     Status: Abnormal   Collection Time: 04/14/16  9:45 PM  Result Value Ref Range   Tricyclic, Ur Screen NONE DETECTED NONE DETECTED   Amphetamines, Ur Screen NONE DETECTED NONE DETECTED   MDMA (Ecstasy)Ur Screen NONE DETECTED NONE DETECTED   Cocaine Metabolite,Ur [place] NONE DETECTED NONE DETECTED   Opiate, Ur Screen NONE DETECTED NONE DETECTED   Phencyclidine (PCP) Ur S NONE DETECTED NONE DETECTED   Cannabinoid 50 Ng, Ur [place] POSITIVE (A) NONE DETECTED   Barbiturates, Ur Screen NONE DETECTED NONE DETECTED   Benzodiazepine, Ur Scrn NONE DETECTED NONE DETECTED   Methadone Scn, Ur NONE DETECTED NONE DETECTED    Comment: (NOTE) 100  Tricyclics, urine               Cutoff 1000 ng/mL 200  Amphetamines, urine             Cutoff 1000 ng/mL 300  MDMA (Ecstasy), urine           Cutoff 500 ng/mL 400  Cocaine Metabolite, urine       Cutoff 300 ng/mL 500  Opiate, urine                   Cutoff 300 ng/mL 600  Phencyclidine (PCP), urine      Cutoff 25 ng/mL 700  Cannabinoid, urine              Cutoff 50 ng/mL 800  Barbiturates, urine             Cutoff 200 ng/mL 900  Benzodiazepine, urine           Cutoff 200 ng/mL 1000 Methadone, urine                Cutoff 300 ng/mL 1100 1200 The urine drug screen provides only a preliminary, unconfirmed 1300 analytical test result and should not be used for non-medical 1400 purposes. Clinical consideration and professional judgment should 1500 be applied to any positive drug screen result due to possible 1600 interfering substances. A more specific alternate chemical method 1700 must be  used in order to obtain a confirmed analytical result.  1800 Gas chromato graphy / mass spectrometry (GC/MS) is the preferred 1900 confirmatory method.     Blood Alcohol level:  Lab Results  Component Value Date   ETH 24 (H) 04/14/2016   ETH <5 12/23/2015    Metabolic  Disorder Labs: Lab Results  Component Value Date   HGBA1C 6.0 12/25/2015   Lab Results  Component Value Date   PROLACTIN 25.5 (H) 12/25/2015   Lab Results  Component Value Date   CHOL 153 12/25/2015   TRIG 73 12/25/2015   HDL 58 12/25/2015   CHOLHDL 2.6 12/25/2015   VLDL 15 12/25/2015   LDLCALC 80 12/25/2015    Physical Findings: AIMS:  , ,  ,  ,    CIWA:    COWS:     Musculoskeletal: Strength & Muscle Tone: within normal limits Gait & Station: normal Patient leans: N/A  Psychiatric Specialty Exam: Physical Exam  Nursing note and vitals reviewed.   Review of Systems  Psychiatric/Behavioral: Positive for depression, substance abuse and suicidal ideas.  All other systems reviewed and are negative.   Blood pressure 112/62, pulse (!) 43, temperature 98 F (36.7 C), temperature source Oral, resp. rate 20, height 5\' 6"  (1.676 m), weight 57.2 kg (126 lb), SpO2 100 %.Body mass index is 20.34 kg/m.  General Appearance: Casual  Eye Contact:  Poor  Speech:  Clear and Coherent  Volume:  Normal  Mood:  Depressed, Hopeless and Worthless  Affect:  Flat  Thought Process:  Goal Directed and Descriptions of Associations: Intact  Orientation:  Full (Time, Place, and Person)  Thought Content:  WDL  Suicidal Thoughts:  Yes.  with intent/plan  Homicidal Thoughts:  No  Memory:  Immediate;   Fair Recent;   Fair Remote;   Fair  Judgement:  Poor  Insight:  Lacking  Psychomotor Activity:  Decreased  Concentration:  Concentration: Fair and Attention Span: Fair  Recall:  F[employer]Fair  Fund of Knowledge:  Fair  Language:  Fair  Akathisia:  No  Handed:  Right  AIMS (if indicated):     Assets:  Communication  Skills Desire for Improvement Housing Physical Health Resilience Social Support  ADL's:  Intact  Cognition:  WNL  Sleep:  Number of Hours: 4.45     Treatment Plan Summary: Daily contact with patient to assess and evaluate symptoms and progress in treatment and Medication management   Roger [name] is a 25 year old male with a history of depression, substance abuse, and Being admitted for suicidal ideation.  1. Suicidal ideation. The patient is able to contract for safety in the hospital.  2. Mood. We started Luvox for depression and gambling. I will increase dose to 100 mg.   3. Insomnia. Trazodone is available.  4. Substance abuse. The patient minimizes his problems and declines substance abuse treatment. He however is interested in treatment for gambling. He will follow up with [facility].   5. Smoking. Nicotine patch is available.  6. Disposition. He will be discharged back with his grandmother. He will follow up with [facility].    Kristine LineaJolanta Pucilowska, MD 04/16/2016, 1:00 PM

## 2016-04-16 NOTE — Progress Notes (Signed)
Pt has been seclusive to his room. Pt continues to endorse passive SI. Pt's mood and affect has been depressed. Pt completed his self inventory form: Pt rated his depression 5/10, His haplessness 0/10 and his anxiety 0/10. Pt states his appetite is good although he did not breakfast or lunch. Pt denies A/V hallucinations . Pt is able to contract for safety. 

## 2016-04-16 NOTE — Plan of Care (Signed)
Problem: Activity: Goal: Interest or engagement in leisure activities will improve Outcome: Progressing Pt in dayroom smiling and socializing with peers more during free time.

## 2016-04-16 NOTE — [facility] Suicide Risk Assessment (Signed)
[facility] INPATIENT:  Family/Significant Other Suicide Prevention Education  Suicide Prevention Education:  Patient Refusal for Family/Significant Other Suicide Prevention Education: The patient Roger [name] has refused to provide written consent for family/significant other to be provided Family/Significant Other Suicide Prevention Education during admission and/or prior to discharge.  Physician notified.  Masa Lubin G. Garnette CzechSampson MSW, LCSWA 04/16/2016, 3:35 PM

## 2016-04-16 NOTE — [facility] Group Notes (Signed)
Patient did not attend therapeutic group today  Laurian Edrington LCSW [phone]  

## 2016-04-16 NOTE — Plan of Care (Signed)
Problem: Medication: Goal: Compliance with prescribed medication regimen will improve Outcome: Progressing Pt has been compliant with prescribed medications   

## 2016-04-16 NOTE — [facility] Group Notes (Signed)
[facility] Group Notes:  (Nursing/MHT/Case Management/Adjunct)  Date:  04/16/2016  Time:  5:47 AM  Type of Therapy:  Psychoeducational Skills  Participation Level:  Minimal  Participation Quality:  Appropriate  Affect:  Appropriate  Cognitive:  Appropriate  Insight:  Appropriate  Engagement in Group:  Limited  Modes of Intervention:  Discussion, Socialization and Support  Summary of Progress/Problems:  Roger MilroyLaquanda Y Jermale [name] 04/16/2016, 5:47 AM

## 2016-04-16 NOTE — Progress Notes (Signed)
Pt endorses Passive SI. Denies AVH. Noted to be more visible in dayroom with peers. Ate evening snack. Appropriate during interaction. Brightens during interaction. Denies pain. Voices no additional concerns at this time. Safety maintained. Will continue to monitor.

## 2016-04-17 MED ORDER — FLUVOXAMINE MALEATE 100 MG PO TABS: 100.0000 mg | ORAL_TABLET | Freq: Every day | ORAL | 0 refills | Status: DC

## 2016-04-17 MED ORDER — TRAZODONE HCL 50 MG PO TABS: 50.0000 mg | ORAL_TABLET | Freq: Every evening | ORAL | 0 refills | Status: DC | PRN
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
## 2016-04-17 NOTE — [facility] Group Notes (Signed)
[facility] Group Notes:  (Nursing/MHT/Case Management/Adjunct)  Date:  04/17/2016  Time:  6:30 AM  Type of Therapy:  Psychoeducational Skills  Participation Level:  Active  Participation Quality:  Appropriate  Affect:  Appropriate  Cognitive:  Alert  Insight:  Good  Engagement in Group:  Engaged  Modes of Intervention:  Support  Summary of Progress/Problems:  Roger NeerJackie L Mel [name] 04/17/2016, 6:30 AM

## 2016-04-17 NOTE — Progress Notes (Addendum)
D: Patient stated slept good last night .Stated appetite is good and energy level  Is normal. Stated concentration is good . Stated on Depression scale 0 , hopeless 0 and anxiety 5 .( low 0-10 high) Denies suicidal  homicidal ideations  .  No auditory hallucinations  No pain concerns . Appropriate ADL'S. Interacting with peers and staff. Patient voice concerns around his job , Stated he doesn't want to lose it  A: Encourage patient participation with unit programming . Instruction  Given on  Medication , verbalize understanding. R: Voice no other concerns. Staff continue to monitor

## 2016-04-17 NOTE — [facility] Suicide Risk Assessment (Signed)
[facility]   Principal Problem: Severe recurrent major depression without psychotic features [facility](HCC) Discharge Diagnoses:  Patient Active Problem List   Diagnosis Date Noted  . Tobacco use disorder [F17.200] 11/25/2014  . Alcohol use disorder, severe, dependence (HCC) [F10.20] 11/25/2014  . Cannabis use disorder, severe, dependence (HCC) [F12.20] 11/25/2014  . 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 Opioid use disorder, moderate, dependence (HCC) [F11.20] 11/25/2014  . 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 Severe recurrent major depression without psychotic features (HCC) [F33.2] 11/24/2014      Psychiatric Specialty Exam: ROS  Blood pressure 98/80, pulse (!) 55, temperature 98 F (36.7 C), temperature source Oral, resp. rate 18, height 5\' 6"  (1.676 m), weight 57.2 kg (126 lb), SpO2 100 %.Body mass index is 20.34 kg/m.                                                       Mental Status Per Nursing Assessment::   On Admission:     Demographic Factors:  Male and Adolescent or young adult  Loss Factors: Financial problems/change in socioeconomic status  Historical Factors: Family history of mental illness or substance abuse and Impulsivity  Risk Reduction Factors:   Sense of responsibility to family, Religious beliefs about death, Employed, Living with another person, especially a relative and Positive social support  Continued Clinical Symptoms:  Alcohol/Substance Abuse/Dependencies Previous Psychiatric Diagnoses and Treatments  Cognitive Features That Contribute To Risk:  None    Suicide Risk:  Minimal: No identifiable suicidal ideation.  Patients presenting with no risk factors but with morbid ruminations; may be classified as minimal risk based on the severity of the depressive symptoms  Follow-up  Information    [facility]. Schedule an appointment as soon as possible for a visit.   Why:  [facility] to schedule appointment [facility].  Please call and arrange appointment tomorrow.  Please asked to be scheduled within the next 5-7 days. Contact information: [address] [place] [address] [phone] Alveria ApleyFAX          Hernandez-Gonzalez,  Shandora Koogler, MD 04/17/2016, 11:10 AM

## 2016-04-17 NOTE — Progress Notes (Signed)
D:Patient aware of discharge this shift . Patient returning home . Patient received all belonging locked up . Patient denies  Suicidal  And homicidal ideations  .  A: Writer instructed on discharge criteria  . Informed of 7 day supply  Of medication  Transitional Record , Suicidal Risk Assessment Discharge Summary and prescriptions  given to patient . Aware  Of follow up appointment . R: Patient left unit with no questions  Or concerns  With family

## 2016-04-17 NOTE — Progress Notes (Signed)
  [facility] :  Will you be returning to the same living situation after discharge:  Yes,    At discharge, do you have transportation home?: Yes,    Do you have the ability to pay for your medications: Yes,     Release of information consent forms completed and in the chart;  Patient's signature needed at discharge.  Patient to Follow up at: Follow-up Information    [facility]. Schedule an appointment as soon as possible for a visit.   Why:  [facility] schedule [facility].  Please call and arrange appointment tomorrow.  Please asked to be scheduled within the next 5-7 days. Contact information: [address] [place] [address] [phone] FAX          Next level of care provider has access to [facility] Link:no  Safety Planning and Suicide Prevention discussed: Yes,     Have you used any form of tobacco in the last 30 days? (Cigarettes, Smokeless Tobacco, Cigars, and/or Pipes): Yes  Has patient been referred to the Quitline?: Patient refused referral  Patient has been referred for addiction treatment: Yes  Glennon MacSara P Maelyn Berrey, MSW, LCSW 04/17/2016, 3:47 PM

## 2016-04-17 NOTE — Plan of Care (Signed)
Problem: Coping: Goal: Ability to cope will improve Outcome: Progressing Working on coping skills , handout given   

## 2016-04-17 NOTE — [facility] Suicide Risk Assessment (Signed)
[facility] INPATIENT:  Family/Significant Other Suicide Prevention Education  Suicide Prevention Education:  Education Completed; Lennox LaityMaary Bozeman, grandmother, [phone],  (name of family member/significant other) has been identified by the patient as the family member/significant other with whom the patient will be residing, and identified as the person(s) who will aid the patient in the event of a mental health crisis (suicidal ideations/suicide attempt).  With written consent from the patient, the family member/significant other has been provided the following suicide prevention education, prior to the and/or following the discharge of the patient.  The suicide prevention education provided includes the following:  Suicide risk factors  Suicide prevention and interventions  National Suicide Hotline telephone number  [facility] assessment telephone number  [facility] Emergency Assistance 911  [facility] and/or Residential Mobile Crisis Unit telephone number  Request made of family/significant other to:  Remove weapons (e.g., guns, rifles, knives), all items previously/currently identified as safety concern.    Remove drugs/medications (over-the-counter, prescriptions, illicit drugs), all items previously/currently identified as a safety concern.  The family member/significant other verbalizes understanding of the suicide prevention education information provided.  The family member/significant other agrees to remove the items of safety concern listed above.  Glennon MacSara P Ellyana Crigler, MSW, LCSW 04/17/2016, 3:50 PM

## 2016-04-18 NOTE — Progress Notes (Signed)
Patient ID: Roger [name], male   DOB: 08/20/1990, 26 y.o.   MRN: [id number] CS[facility]/2016 at 8:00am. Spoke with his grandmother about appointment.  She agreed to let him know.  Asked her to tell him to reschedule as soon as possible if it was not going to work for him.  Jake SharkSara Jaelin Fackler, MSW, LCSW

## 2016-05-09 ENCOUNTER — Encounter: Payer: Self-pay | Admitting: Emergency Medicine

## 2016-05-09 ENCOUNTER — Emergency Department: Payer: Self-pay

## 2016-05-09 ENCOUNTER — Emergency Department
Admission: EM | Admit: 2016-05-09 | Discharge: 2016-05-09 | Disposition: A | Discharge status: Home / Self Care | Payer: Self-pay | Attending: Emergency Medicine | Admitting: Emergency Medicine

## 2016-05-09 DIAGNOSIS — Y939 Activity, unspecified: Secondary | ICD-10-CM | POA: Insufficient documentation

## 2016-05-09 DIAGNOSIS — Y999 Unspecified external cause status: Secondary | ICD-10-CM | POA: Insufficient documentation

## 2016-05-09 DIAGNOSIS — S022XXA Fracture of nasal bones, initial encounter for closed fracture: Secondary | ICD-10-CM | POA: Insufficient documentation

## 2016-05-09 DIAGNOSIS — Y92512 Supermarket, store or market as the place of occurrence of the external cause: Secondary | ICD-10-CM | POA: Insufficient documentation

## 2016-05-09 DIAGNOSIS — F17210 Nicotine dependence, cigarettes, uncomplicated: Secondary | ICD-10-CM | POA: Insufficient documentation

## 2016-05-09 MED ORDER — FLUTICASONE PROPIONATE 50 MCG/ACT NA SUSP: 1.0000 | Freq: Two times a day (BID) | NASAL | 0 refills | Status: DC

## 2016-05-09 MED ORDER — MELOXICAM 15 MG PO TABS: 15.0000 mg | ORAL_TABLET | Freq: Every day | ORAL | 0 refills | Status: DC
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
## 2016-05-09 NOTE — ED Notes (Signed)

## 2017-05-30 ENCOUNTER — Encounter: Payer: Self-pay | Admitting: Emergency Medicine

## 2017-05-30 ENCOUNTER — Emergency Department
Admission: EM | Admit: 2017-05-30 | Discharge: 2017-05-31 | Disposition: A | Discharge status: Other Acute Inpatient Hospital | Payer: Self-pay | Attending: Emergency Medicine | Admitting: Emergency Medicine

## 2017-05-30 DIAGNOSIS — F329 Major depressive disorder, single episode, unspecified: Secondary | ICD-10-CM | POA: Insufficient documentation

## 2017-05-30 DIAGNOSIS — F1910 Other psychoactive substance abuse, uncomplicated: Secondary | ICD-10-CM

## 2017-05-30 DIAGNOSIS — R45851 Suicidal ideations: Secondary | ICD-10-CM | POA: Insufficient documentation

## 2017-05-30 DIAGNOSIS — F17210 Nicotine dependence, cigarettes, uncomplicated: Secondary | ICD-10-CM | POA: Insufficient documentation

## 2017-05-30 DIAGNOSIS — F332 Major depressive disorder, recurrent severe without psychotic features: Secondary | ICD-10-CM | POA: Diagnosis present

## 2017-05-30 DIAGNOSIS — F1220 Cannabis dependence, uncomplicated: Secondary | ICD-10-CM | POA: Diagnosis present

## 2017-05-30 LAB — COMPREHENSIVE METABOLIC PANEL
ALK PHOS: 79 U/L (ref 38–126)
ALT: 12 U/L — AB (ref 17–63)
AST: 23 U/L (ref 15–41)
Albumin: 4.3 g/dL (ref 3.5–5.0)
Anion gap: 8 (ref 5–15)
BUN: 10 mg/dL (ref 6–20)
CHLORIDE: 100 mmol/L — AB (ref 101–111)
CO2: 29 mmol/L (ref 22–32)
CREATININE: 1.17 mg/dL (ref 0.61–1.24)
Calcium: 9 mg/dL (ref 8.9–10.3)
GFR calc non Af Amer: >60 mL/min (ref >60)
Glucose, Bld: 95 mg/dL (ref 65–99)
Potassium: 3.5 mmol/L (ref 3.5–5.1)
SODIUM: 137 mmol/L (ref 135–145)
Total Bilirubin: 1.1 mg/dL (ref 0.3–1.2)
Total Protein: 8 g/dL (ref 6.5–8.1)

## 2017-05-30 LAB — ACETAMINOPHEN LEVEL

## 2017-05-30 LAB — URINE DRUG SCREEN, QUALITATIVE (ARMC ONLY)
Amphetamines, Ur Screen: NOT DETECTED
BARBITURATES, UR SCREEN: NOT DETECTED
BENZODIAZEPINE, UR SCRN: NOT DETECTED
CANNABINOID 50 NG, UR ~~LOC~~: POSITIVE — AB
Cocaine Metabolite,Ur ~~LOC~~: NOT DETECTED
MDMA (ECSTASY) UR SCREEN: NOT DETECTED
Methadone Scn, Ur: NOT DETECTED
Opiate, Ur Screen: NOT DETECTED
PHENCYCLIDINE (PCP) UR S: NOT DETECTED
Tricyclic, Ur Screen: NOT DETECTED

## 2017-05-30 LAB — CBC
HCT: 43.4 % (ref 40.0–52.0)
Hemoglobin: 14.9 g/dL (ref 13.0–18.0)
MCH: 30.3 pg (ref 26.0–34.0)
MCHC: 34.3 g/dL (ref 32.0–36.0)
MCV: 88.4 fL (ref 80.0–100.0)
PLATELETS: 231 10*3/uL (ref 150–440)
RBC: 4.91 MIL/uL (ref 4.40–5.90)
RDW: 14.3 % (ref 11.5–14.5)
WBC: 7.1 10*3/uL (ref 3.8–10.6)

## 2017-05-30 LAB — SALICYLATE LEVEL: Salicylate Lvl: <7 mg/dL (ref 2.8–30.0)

## 2017-05-30 LAB — ETHANOL

## 2017-05-30 MED ORDER — HYDROXYZINE HCL 25 MG PO TABS: 50.0000 mg | ORAL_TABLET | Freq: Four times a day (QID) | ORAL | Status: DC | PRN

## 2017-05-30 MED ORDER — TRAZODONE HCL 100 MG PO TABS
100.0000 mg | ORAL_TABLET | Freq: Every day | ORAL | Status: DC
  Administered 2017-05-30: 100 mg via ORAL
  Filled 2017-05-30: qty 1

## 2017-05-30 MED ORDER — FLUVOXAMINE MALEATE 50 MG PO TABS
100.0000 mg | ORAL_TABLET | Freq: Every day | ORAL | Status: DC
  Administered 2017-05-30: 100 mg via ORAL
  Filled 2017-05-30 (×2): qty 2
  Filled 2017-05-30: qty 1

## 2017-05-30 NOTE — ED Notes (Signed)
Pt IVC/Seen by Dr Clapacs-Recommends placement. 

## 2017-05-30 NOTE — ED Provider Notes (Signed)
-----------------------------------------   5:13 PM on 05/30/2017 -----------------------------------------  Patient has been seen and evaluated by psychiatry, they will be admitting to their unit once a bed becomes available.  Medical workup has been largely nonrevealing.   Minna AntisPaduchowski, Thecla Forgione, MD 05/30/17 [phone]

## 2017-05-30 NOTE — Consult Note (Signed)
[facility] Consult   Reason for Consult: Consult for 27 year old man with a history of depression who comes to the emergency room under IVC with suicidal thought Referring Physician: Paduchowski Patient Identification: Roger [name] MRN:  [id number] Principal Diagnosis: Severe recurrent major depression without psychotic features [facility]) Diagnosis:   Patient Active Problem List   Diagnosis Date Noted  . Tobacco use disorder [F17.200] 11/25/2014  . Alcohol use disorder, severe, dependence ([place]) [F10.20] 11/25/2014  . Cannabis use disorder, severe, dependence ([place]) [F12.20] 11/25/2014  . 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 Opioid use disorder, moderate, dependence ([place]) [F11.20] 11/25/2014  . 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 Severe recurrent major depression without psychotic features ([place]) [F33.2] 11/24/2014    Total Time spent with patient: 1 hour  Subjective:   Roger [name] is a 27 y.o. male patient admitted with "I am done.  I have had it.".  HPI: Patient interviewed, chart reviewed.  Old psych notes reviewed.  27 year old man with a past history of depression and substance abuse.  Brought to the hospital by law enforcement today.  Patient says that he was attempting to "commit suicide by cop".  He tells me that he called the police and that he had been planning to either assault the officer or do something to agitate them enough that they would kill him.  As it turned out the police officer was sympathetic and simply talk to the patient down and then brought him into the hospital.  Patient says he has been very depressed.  He is somewhat withdrawn and it is hard to get specifics from him.  He tells me at one point that he never felt any better after his hospitalization last year but eventually admits that recently things have been getting  worse.  He sleeps very poorly.  Appetite is poor.  Losing weight.  He has not been on any medication or getting any follow-up mental health treatment probably in the whole of the last year.  He admits that he drinks but says that his alcohol use is minimal in by his opinion is not a problem.  He admits that he continues to smoke marijuana probably twice a day however.  He says he has not been using any other drugs recently.  Denies any psychotic symptoms.  Denies any actual wish to hurt anyone else.  Social history: Patient has been staying with friends.  He lost his little most recent job at the [employer].  He is estranged from his family.  This is a topic that is obviously difficult for him but he has a lot of complaints about feeling his grandparents have not been treating him right.  Denies he has any kind of romantic relationship.  Medical history: No significant ongoing medical problems.  He said that he quit his job at [employer] because he had such a terrible toothache but apparently now he has had the tooth taken care of and no longer has pain from it.  Substance abuse history: History of alcohol and marijuana use and past.  No history of DTs or seizures.  Past Psychiatric History: Patient has had several prior suicide attempts by hanging and by overdose.  He was last here admitted to the hospital in January 2018.  Prior to that had had several hospitalizations.  He is rather vague about how things have been going for the last year.  He tells  me however that he was never compliant with outpatient medicine.  No history of mania.  Risk to Self: Is patient at risk for suicide?: Yes Risk to Others:   Prior Inpatient Therapy:   Prior Outpatient Therapy:    Past Medical History:  Past Medical History:  Diagnosis Date  . Depression   . Headache     Past Surgical History:  Procedure Laterality Date  . FEMUR FRACTURE SURGERY     Family History:  Family History  Problem  Relation Age of Onset  . Alcohol abuse Father    Family Psychiatric  History: Positive for some people with substance use Social History:  Social History   Substance and Sexual Activity  Alcohol Use Yes  . Alcohol/week: 6.0 oz  . Types: 3 Cans of beer, 7 Shots of liquor per week     Social History   Substance and Sexual Activity  Drug Use Yes  . Types: Amphetamines, Cocaine, Marijuana, Benzodiazepines, MDMA (Ecstacy), Hydrocodone    Social History   Socioeconomic History  . Marital status: Single    Spouse name: None  . Number of children: None  . Years of education: None  . Highest education level: None  Social Needs  . Financial resource strain: None  . Food insecurity - worry: None  . Food insecurity - inability: None  . Transportation needs - medical: None  . Transportation needs - non-medical: None  Occupational History  . None  Tobacco Use  . Smoking status: Current Every Day Smoker    Packs/day: 0.50    Years: 10.00    Pack years: 5.00    Types: Cigarettes  . Smokeless tobacco: Former Systems developer    Quit date: 11/24/2014  Substance and Sexual Activity  . Alcohol use: Yes    Alcohol/week: 6.0 oz    Types: 3 Cans of beer, 7 Shots of liquor per week  . Drug use: Yes    Types: Amphetamines, Cocaine, Marijuana, Benzodiazepines, MDMA (Ecstacy), Hydrocodone  . Sexual activity: Yes    Birth control/protection: None  Other Topics Concern  . None  Social History Narrative  . None   Additional Social History:    Allergies:   Allergies  Allergen Reactions  . Haldol [Haloperidol Lactate]     EPS sxs    Labs:  Results for orders placed or performed during the hospital encounter of 05/30/17 (from the past 48 hour(s))  Comprehensive metabolic panel     Status: Abnormal   Collection Time: 05/30/17  1:14 PM  Result Value Ref Range   Sodium 137 135 - 145 mmol/L   Potassium 3.5 3.5 - 5.1 mmol/L   Chloride 100 (L) 101 - 111 mmol/L   CO2 29 22 - 32 mmol/L   Glucose,  Bld 95 65 - 99 mg/dL   BUN 10 6 - 20 mg/dL   Creatinine, Ser 1.17 0.61 - 1.24 mg/dL   Calcium 9.0 8.9 - 10.3 mg/dL   Total Protein 8.0 6.5 - 8.1 g/dL   Albumin 4.3 3.5 - 5.0 g/dL   AST 23 15 - 41 U/L   ALT 12 (L) 17 - 63 U/L   Alkaline Phosphatase 79 38 - 126 U/L   Total Bilirubin 1.1 0.3 - 1.2 mg/dL   GFR calc non Af Amer >60 >60 mL/min   GFR calc Af Amer >60 >60 mL/min    Comment: (NOTE) The eGFR has been calculated using the CKD EPI equation. This calculation has not been validated in all clinical situations.  eGFR's persistently <60 mL/min signify possible Chronic Kidney Disease.    Anion gap 8 5 - 15    Comment: Performed at [facility], [address] [place], [place] [address]  cbc     Status: None   Collection Time: 05/30/17  1:14 PM  Result Value Ref Range   WBC 7.1 3.8 - 10.6 K/uL   RBC 4.91 4.40 - 5.90 MIL/uL   Hemoglobin 14.9 13.0 - 18.0 g/dL   HCT 43.4 40.0 - 52.0 %   MCV 88.4 80.0 - 100.0 fL   MCH 30.3 26.0 - 34.0 pg   MCHC 34.3 32.0 - 36.0 g/dL   RDW 14.3 11.5 - 14.5 %   Platelets 231 150 - 440 K/uL    Comment: Performed at [facility], [address] [place], [place] [address]  Urine Drug Screen, Qualitative     Status: Abnormal   Collection Time: 05/30/17  1:14 PM  Result Value Ref Range   Tricyclic, Ur Screen NONE DETECTED NONE DETECTED   Amphetamines, Ur Screen NONE DETECTED NONE DETECTED   MDMA (Ecstasy)Ur Screen NONE DETECTED NONE DETECTED   Cocaine Metabolite,Ur [place] NONE DETECTED NONE DETECTED   Opiate, Ur Screen NONE DETECTED NONE DETECTED   Phencyclidine (PCP) Ur S NONE DETECTED NONE DETECTED   Cannabinoid 50 Ng, Ur [place] POSITIVE (A) NONE DETECTED   Barbiturates, Ur Screen NONE DETECTED NONE DETECTED   Benzodiazepine, Ur Scrn NONE DETECTED NONE DETECTED   Methadone Scn, Ur NONE DETECTED NONE DETECTED    Comment: (NOTE) Tricyclics + metabolites, urine    Cutoff 1000 ng/mL Amphetamines + metabolites, urine  Cutoff 1000  ng/mL MDMA (Ecstasy), urine              Cutoff 500 ng/mL Cocaine Metabolite, urine          Cutoff 300 ng/mL Opiate + metabolites, urine        Cutoff 300 ng/mL Phencyclidine (PCP), urine         Cutoff 25 ng/mL Cannabinoid, urine                 Cutoff 50 ng/mL Barbiturates + metabolites, urine  Cutoff 200 ng/mL Benzodiazepine, urine              Cutoff 200 ng/mL Methadone, urine                   Cutoff 300 ng/mL The urine drug screen provides only a preliminary, unconfirmed analytical test result and should not be used for non-medical purposes. Clinical consideration and professional judgment should be applied to any positive drug screen result due to possible interfering substances. A more specific alternate chemical method must be used in order to obtain a confirmed analytical result. Gas chromatography / mass spectrometry (GC/MS) is the preferred confirmat ory method. Performed at [facility], [address] [place],  [address]   Acetaminophen level     Status: Abnormal   Collection Time: 05/30/17  1:25 PM  Result Value Ref Range   Acetaminophen (Tylenol), Serum <10 (L) 10 - 30 ug/mL    Comment:        THERAPEUTIC CONCENTRATIONS VARY SIGNIFICANTLY. A RANGE OF 10-30 ug/mL MAY BE AN EFFECTIVE CONCENTRATION FOR MANY PATIENTS. HOWEVER, SOME ARE BEST TREATED AT CONCENTRATIONS OUTSIDE THIS RANGE. ACETAMINOPHEN CONCENTRATIONS >150 ug/mL AT 4 HOURS AFTER INGESTION AND >50 ug/mL AT 12 HOURS AFTER INGESTION ARE OFTEN ASSOCIATED WITH TOXIC REACTIONS. Performed at [facility], [address],  [place], [place] [address]   Ethanol     Status: None   Collection Time: 05/30/17  1:25 PM  Result Value Ref Range   Alcohol, Ethyl (B) <10 <10 mg/dL    Comment:        LOWEST DETECTABLE LIMIT FOR SERUM ALCOHOL IS 10 mg/dL FOR MEDICAL PURPOSES ONLY Performed at [facility], [address] [place], [place] [address]   Salicylate level      Status: None   Collection Time: 05/30/17  1:25 PM  Result Value Ref Range   Salicylate Lvl <8.1 2.8 - 30.0 mg/dL    Comment: Performed at [facility], [address] [place], [place] [address]    Current Facility-Administered Medications  Medication Dose Route Frequency Provider Last Rate Last Dose  . fluvoxaMINE (LUVOX) tablet 100 mg  100 mg Oral QHS Kaelyn Innocent T, MD      . hydrOXYzine (ATARAX/VISTARIL) tablet 50 mg  50 mg Oral Q6H PRN Mariam Helbert T, MD      . traZODone (DESYREL) tablet 100 mg  100 mg Oral QHS Ahsan Esterline, Madie Reno, MD       Current Outpatient Medications  Medication Sig Dispense Refill  . fluticasone (FLONASE) 50 MCG/ACT nasal spray Place 1 spray into both nostrils 2 (two) times daily. 16 g 0  . fluvoxaMINE (LUVOX) 100 MG tablet Take 1 tablet (100 mg total) by mouth at bedtime. 30 tablet 0  . meloxicam (MOBIC) 15 MG tablet Take 1 tablet (15 mg total) by mouth daily. 30 tablet 0  . traZODone (DESYREL) 50 MG tablet Take 1 tablet (50 mg total) by mouth at bedtime as needed for sleep. 30 tablet 0    Musculoskeletal: Strength & Muscle Tone: within normal limits Gait & Station: normal Patient leans: N/A  Psychiatric Specialty Exam: Physical Exam  Nursing note and vitals reviewed. Constitutional: He appears well-developed and well-nourished.  HENT:  Head: Normocephalic and atraumatic.  Eyes: Conjunctivae are normal. Pupils are equal, round, and reactive to light.  Neck: Normal range of motion.  Cardiovascular: Regular rhythm and normal heart sounds.  Respiratory: Effort normal. No respiratory distress.  GI: Soft.  Musculoskeletal: Normal range of motion.  Neurological: He is alert.  Skin: Skin is warm and dry.  Psychiatric: His speech is delayed. He is slowed. Cognition and memory are normal. He expresses impulsivity and inappropriate judgment. He exhibits a depressed mood. He expresses suicidal ideation. He expresses suicidal plans.    Review of  Systems  Constitutional: Negative.   HENT: Negative.   Eyes: Negative.   Respiratory: Negative.   Cardiovascular: Negative.   Gastrointestinal: Negative.   Musculoskeletal: Negative.   Skin: Negative.   Neurological: Negative.   Psychiatric/Behavioral: Positive for depression, substance abuse and suicidal ideas. Negative for hallucinations and memory loss. The patient is nervous/anxious and has insomnia.     Blood pressure 122/83, pulse 100, temperature 97.9 F (36.6 C), temperature source Oral, resp. rate 16, height '5\' 6"'$  (1.676 m), weight 56.7 kg (125 lb), SpO2 96 %.Body mass index is 20.18 kg/m.  General Appearance: Fairly Groomed  Eye Contact:  Minimal  Speech:  Slow  Volume:  Decreased  Mood:  Depressed  Affect:  Flat  Thought Process:  Coherent  Orientation:  Full (Time, Place, and Person)  Thought Content:  Rumination  Suicidal Thoughts:  Yes.  with intent/plan  Homicidal Thoughts:  No  Memory:  Immediate;   Good Recent;   Fair Remote;   Fair  Judgement:  Poor  Insight:  Shallow  Psychomotor Activity:  Decreased  Concentration:  Concentration: Fair  Recall:  [employer] of Knowledge:  Fair  Language:  Fair  Akathisia:  No  Handed:  Right  AIMS (if indicated):     Assets:  Desire for Improvement Physical Health Resilience  ADL's:  Intact  Cognition:  WNL  Sleep:        Treatment Plan Summary: Daily contact with patient to assess and evaluate symptoms and progress in treatment, Medication management and Plan 27 year old man with major depression severe recurrent without.  Long history of mood and behavior problems.  Using heavy marijuana.  Says he is not drinking a lot.  Patient is very withdrawn and flat.  Certainly meets commitment criteria and merits inpatient hospitalization for suicidality.  Patient understands the treatment plan and is agreeable.  Encouraged him to eat and drink and stay well-hydrated.  I have put in orders to restart the Luvox and trazodone  that were being prescribed for him last time he was here in the hospital.  Case reviewed with TTS and emergency room physician.  Admission orders in place when a bed is available.  Disposition: Recommend psychiatric Inpatient admission when medically cleared. Supportive therapy provided about ongoing stressors.  Alethia Berthold, MD 05/30/2017 4:06 PM

## 2017-05-30 NOTE — ED Provider Notes (Signed)
[facility] Emergency Department Provider Note  ____________________________________________  Time seen: Approximately 1:51 PM  I have reviewed the triage vital signs and the nursing notes.   HISTORY  Chief Complaint Psychiatric Evaluation   HPI Roger [name] is a 27 y.o. male with a history of polysubstance abuse, depression who presents IVC by his grandmother for suicidal ideation. According to paper to take his medications and has been making threats of committing suicide. Patient tells me that his plan is "suicide by cop". Patient denies ever trying to commit suicide in the past. He reports that his symptoms are persistent for several years. He denies taking his medications because he tells me that it'll help. He drinks alcohol occasionally. He reports that he only smokes marijuana and is no longer doing any other drugs. He denies any medical complaints.  Chief Complaint: depression Severity: severe Duration: several years Context: medication non compliance, drug/alcohol use Modifying factors: meds don't make it better Associated signs/symptoms: suicidal ideation with plan    Past Medical History:  Diagnosis Date  . Depression   . Headache     Patient Active Problem List   Diagnosis Date Noted  . Tobacco use disorder 11/25/2014  . Alcohol use disorder, severe, dependence (HCC) 11/25/2014  . Cannabis use disorder, severe, dependence (HCC) 11/25/2014  . 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 Opioid use disorder, moderate, dependence (HCC) 11/25/2014  . 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 Severe recurrent major depression without psychotic features (HCC) 11/24/2014    Past Surgical History:  Procedure Laterality Date  . FEMUR FRACTURE SURGERY      Prior to Admission medications   Medication Sig Start Date End Date Taking? Authorizing Provider    fluticasone (FLONASE) 50 MCG/ACT nasal spray Place 1 spray into both nostrils 2 (two) times daily. 05/09/16   Cuthriell, Delorise Royals, PA-C  fluvoxaMINE (LUVOX) 100 MG tablet Take 1 tablet (100 mg total) by mouth at bedtime. 04/17/16   Jimmy Footman, MD  meloxicam (MOBIC) 15 MG tablet Take 1 tablet (15 mg total) by mouth daily. 05/09/16   Cuthriell, Delorise Royals, PA-C  traZODone (DESYREL) 50 MG tablet Take 1 tablet (50 mg total) by mouth at bedtime as needed for sleep. 04/17/16   Jimmy Footman, MD    Allergies Haldol [haloperidol lactate]  Family History  Problem Relation Age of Onset  . Alcohol abuse Father     Social History Social History   Tobacco Use  . Smoking status: Current Every Day Smoker    Packs/day: 0.50    Years: 10.00    Pack years: 5.00    Types: Cigarettes  . Smokeless tobacco: Former Neurosurgeon    Quit date: 11/24/2014  Substance Use Topics  . Alcohol use: Yes    Alcohol/week: 6.0 oz    Types: 3 Cans of beer, 7 Shots of liquor per week  . Drug use: Yes    Types: Amphetamines, Cocaine, Marijuana, Benzodiazepines, MDMA (Ecstacy), Hydrocodone    Review of Systems  Constitutional: Negative for fever. Eyes: Negative for visual changes. ENT: Negative for sore throat. Neck: No neck pain  Cardiovascular: Negative for chest pain. Respiratory: Negative for shortness of breath. Gastrointestinal: Negative for abdominal pain, vomiting or diarrhea. Genitourinary: Negative for dysuria. Musculoskeletal: Negative for back pain. Skin: Negative for rash. Neurological: Negative for headaches, weakness or numbness. Psych: + depression and SI. No HI  ____________________________________________   PHYSICAL EXAM:  VITAL SIGNS:  ED Triage Vitals [05/30/17 1312]  Enc Vitals Group     BP 122/83     Pulse Rate 100     Resp 16     Temp 97.9 F (36.6 C)     Temp Source Oral     SpO2 96 %     Weight 125 lb (56.7 kg)     Height 5\' 6"  (1.676 m)     Head  Circumference      Peak Flow      Pain Score      Pain Loc      Pain Edu?      Excl. in GC?     Constitutional: Alert and oriented. Well appearing and in no apparent distress. HEENT:      Head: Normocephalic and atraumatic.         Eyes: Conjunctivae are normal. Sclera is non-icteric.       Mouth/Throat: Mucous membranes are moist.       Neck: Supple with no signs of meningismus. Cardiovascular: Regular rate and rhythm. No murmurs, gallops, or rubs. 2+ symmetrical distal pulses are present in all extremities. No JVD. Respiratory: Normal respiratory effort. Lungs are clear to auscultation bilaterally. No wheezes, crackles, or rhonchi.  Gastrointestinal: Soft, non tender, and non distended with positive bowel sounds. No rebound or guarding. Musculoskeletal: Nontender with normal range of motion in all extremities. No edema, cyanosis, or erythema of extremities. Neurologic: Normal speech and language. Face is symmetric. Moving all extremities. No gross focal neurologic deficits are appreciated. Skin: Skin is warm, dry and intact. No rash noted. Psychiatric: Blunt mood and affect. SI and plan  ____________________________________________   LABS (all labs ordered are listed, but only abnormal results are displayed)  Labs Reviewed  COMPREHENSIVE METABOLIC PANEL - Abnormal; Notable for the following components:      Result Value   Chloride 100 (*)    ALT 12 (*)    All other components within normal limits  CBC  URINE DRUG SCREEN, QUALITATIVE ([facility] ONLY)  ACETAMINOPHEN LEVEL  ETHANOL  SALICYLATE LEVEL   ____________________________________________  EKG  none  ____________________________________________  RADIOLOGY  none  ____________________________________________   PROCEDURES  Procedure(s) performed: None Procedures Critical Care performed:  None ____________________________________________   INITIAL IMPRESSION / ASSESSMENT AND PLAN / ED COURSE  27 y.o. male  with a history of polysubstance abuse, depression who presents IVC by his grandmother for suicidal ideation. Will keep IVC paperwork as patient is a threat to himself at this time. We'll do labs to clear medically. Will consult psychiatry.      As part of my medical decision making, I reviewed the following data within the electronic MEDICAL RECORD NUMBER Nursing notes reviewed and incorporated, Labs reviewed , A consult was requested and obtained from this/these consultant(s) Psychiatry, Notes from prior ED visits and [place] Controlled Substance Database    Pertinent labs & imaging results that were available during my care of the patient were reviewed by me and considered in my medical decision making (see chart for details).    ____________________________________________   FINAL CLINICAL IMPRESSION(S) / ED DIAGNOSES  Final diagnoses:  Severe episode of recurrent major depressive disorder, without psychotic features (HCC)  Suicidal ideation  Polysubstance abuse (HCC)      NEW MEDICATIONS STARTED DURING THIS VISIT:  ED Discharge Orders    None       Note:  This document was prepared using Dragon voice recognition software and may include unintentional dictation errors.  Don PerkingVeronese, [place], MD 05/30/17 1356

## 2017-05-30 NOTE — ED Notes (Signed)
MD Clapacs is currently at his bedside  - pt to transfer after consult complete

## 2017-05-30 NOTE — ED Notes (Signed)
Pt calm and cooperative with staff.  Pt endorses passive SI. "Not at the moment."  Denies HI and AVH.  Pt has not followed up with outpatient treatment since last hospital visit a year ago. Not taking any medications at home.  Pt states he has no where to live and no family support.  Pt guarded. No behavioral issues. Maintained on 15 minute checks and observation by security camera for safety.

## 2017-05-30 NOTE — ED Notes (Signed)
BEHAVIORAL HEALTH ROUNDING Patient sleeping: No. Patient alert and oriented: yes Behavior appropriate: Yes.  ; If no, describe:  Nutrition and fluids offered: yes Toileting and hygiene offered: Yes  Sitter present: q15 minute observations and security  monitoring Law enforcement present: Yes  ODS  

## 2017-05-30 NOTE — ED Notes (Signed)
Pt. Appears to be in a good mood, maintaining good eye contact.  Pt. Aware he will be going downstairs when room available.  Pt. Stated "ready to get my self right".  Pt. Appears to be motivated to help with his treatment.

## 2017-05-30 NOTE — ED Triage Notes (Signed)
Pt comes into the ED via [employer] officers for involuntary commitment.  Patient when asked if he has SI or HI he states "maybe, I cant say one way versus the other".  Patient states "Im just done".  He states he has been hard on himself for not doing the right things.  Per [employer] the patient made comment x3 "that the only way he was leaving was in a body bag by suicide by cop".  Patient appears detached in his conversations with this RN.
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
## 2017-05-30 NOTE — ED Notes (Signed)
[facility]Consult ordered/ [facility]

## 2017-05-30 NOTE — ED Notes (Signed)

## 2017-05-31 ENCOUNTER — Other Ambulatory Visit: Payer: Self-pay

## 2017-05-31 ENCOUNTER — Inpatient Hospital Stay
Admission: AD | Admit: 2017-05-31 | Discharge: 2017-06-04 | DRG: 885 | Disposition: A | Discharge status: Home / Self Care | Payer: No Typology Code available for payment source | Attending: Psychiatry | Admitting: Psychiatry

## 2017-05-31 ENCOUNTER — Encounter: Payer: Self-pay | Admitting: *Deleted

## 2017-05-31 DIAGNOSIS — R634 Abnormal weight loss: Secondary | ICD-10-CM | POA: Diagnosis present

## 2017-05-31 DIAGNOSIS — R45851 Suicidal ideations: Secondary | ICD-10-CM | POA: Diagnosis present

## 2017-05-31 DIAGNOSIS — Z87898 Personal history of other specified conditions: Secondary | ICD-10-CM

## 2017-05-31 DIAGNOSIS — F1910 Other psychoactive substance abuse, uncomplicated: Secondary | ICD-10-CM | POA: Diagnosis present

## 2017-05-31 DIAGNOSIS — Z79899 Other long term (current) drug therapy: Secondary | ICD-10-CM | POA: Diagnosis not present

## 2017-05-31 DIAGNOSIS — F1220 Cannabis dependence, uncomplicated: Secondary | ICD-10-CM | POA: Diagnosis present

## 2017-05-31 DIAGNOSIS — Z6820 Body mass index (BMI) 20.0-20.9, adult: Secondary | ICD-10-CM

## 2017-05-31 DIAGNOSIS — Z818 Family history of other mental and behavioral disorders: Secondary | ICD-10-CM | POA: Diagnosis not present

## 2017-05-31 DIAGNOSIS — Z888 Allergy status to other drugs, medicaments and biological substances status: Secondary | ICD-10-CM

## 2017-05-31 DIAGNOSIS — G4700 Insomnia, unspecified: Secondary | ICD-10-CM | POA: Diagnosis present

## 2017-05-31 DIAGNOSIS — F17200 Nicotine dependence, unspecified, uncomplicated: Secondary | ICD-10-CM | POA: Diagnosis present

## 2017-05-31 DIAGNOSIS — Z811 Family history of alcohol abuse and dependence: Secondary | ICD-10-CM

## 2017-05-31 DIAGNOSIS — Z9119 Patient's noncompliance with other medical treatment and regimen: Secondary | ICD-10-CM | POA: Diagnosis not present

## 2017-05-31 DIAGNOSIS — Z915 Personal history of self-harm: Secondary | ICD-10-CM | POA: Diagnosis not present

## 2017-05-31 DIAGNOSIS — F17210 Nicotine dependence, cigarettes, uncomplicated: Secondary | ICD-10-CM | POA: Diagnosis present

## 2017-05-31 DIAGNOSIS — Z791 Long term (current) use of non-steroidal anti-inflammatories (NSAID): Secondary | ICD-10-CM | POA: Diagnosis not present

## 2017-05-31 DIAGNOSIS — Z560 Unemployment, unspecified: Secondary | ICD-10-CM

## 2017-05-31 DIAGNOSIS — R4587 Impulsiveness: Secondary | ICD-10-CM | POA: Diagnosis present

## 2017-05-31 DIAGNOSIS — F332 Major depressive disorder, recurrent severe without psychotic features: Principal | ICD-10-CM | POA: Diagnosis present

## 2017-05-31 MED ORDER — FLUVOXAMINE MALEATE 50 MG PO TABS
100.0000 mg | ORAL_TABLET | Freq: Every day | ORAL | Status: DC
  Administered 2017-05-31 – 2017-06-03 (×4): 100 mg via ORAL
  Filled 2017-05-31 (×4): qty 2

## 2017-05-31 MED ORDER — HYDROXYZINE HCL 50 MG PO TABS
50.0000 mg | ORAL_TABLET | Freq: Four times a day (QID) | ORAL | Status: DC | PRN
  Administered 2017-06-03: 50 mg via ORAL
  Filled 2017-05-31: qty 1

## 2017-05-31 MED ORDER — OLANZAPINE 5 MG PO TABS
5.0000 mg | ORAL_TABLET | Freq: Every day | ORAL | Status: DC
  Administered 2017-05-31 – 2017-06-03 (×4): 5 mg via ORAL
  Filled 2017-05-31 (×4): qty 1

## 2017-05-31 MED ORDER — TRAZODONE HCL 100 MG PO TABS
100.0000 mg | ORAL_TABLET | Freq: Every day | ORAL | Status: DC
  Administered 2017-05-31 – 2017-06-03 (×4): 100 mg via ORAL
  Filled 2017-05-31 (×4): qty 1

## 2017-05-31 MED ORDER — ENSURE ENLIVE PO LIQD
237.0000 mL | Freq: Three times a day (TID) | ORAL | Status: DC
  Administered 2017-05-31 – 2017-06-04 (×8): 237 mL via ORAL

## 2017-05-31 MED ORDER — ALUM & MAG HYDROXIDE-SIMETH 200-200-20 MG/5ML PO SUSP: 30.0000 mL | ORAL | Status: DC | PRN

## 2017-05-31 MED ORDER — ACETAMINOPHEN 325 MG PO TABS
650.0000 mg | ORAL_TABLET | Freq: Four times a day (QID) | ORAL | Status: DC | PRN
  Administered 2017-06-02: 650 mg via ORAL
  Filled 2017-05-31: qty 2

## 2017-05-31 MED ORDER — MAGNESIUM HYDROXIDE 400 MG/5ML PO SUSP: 30.0000 mL | Freq: Every day | ORAL | Status: DC | PRN

## 2017-05-31 NOTE — Progress Notes (Signed)
27 year old male IVc"ed with MDD without psychotic features.  Receive on the unit in scrubs with noted body odor.  Avoids eye contact.  Laughs inappropriately. Minimizes why he is here and further states that he does not need any medication.   States that goal is to get sober and stay sober.  Body search and skin assessment performed.  No contraband found.  Skin warm and dry to touch with no broken areas noted.  Multiple tattoos noted to bilateral arms.

## 2017-05-31 NOTE — Progress Notes (Addendum)
Patient found in day room upon my arrival. Patient is visible and social throughout the evening. Reports intermittent SI. Denies HI and AVH. Patient reports depression but affect is incongruent. Patient affect is animated and he is joking and socializing with peers. Denies pain. Reports eating and voiding adequately. Compliant with HS medications and staff direction. Q 15 minute checks maintained. Will continue to monitor throughout the shift. Patient slept 8.25 hours. No apparent distress. Will endorse care to oncoming shift.

## 2017-05-31 NOTE — Progress Notes (Signed)
Talked to Charge Nurse Jamesetta Sohyllis to place pt in BMU. Contacted [facility] and informed Melissa of pt's transfer info. Provided intake pt's transfer info.  Room: 304 Physician: Dr. PDemetrius Charity
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
## 2017-05-31 NOTE — Plan of Care (Signed)
  Not Progressing- New Admit Education: Knowledge of [facility] Education information/materials will improve 05/31/2017 2233 - Not Progressing by Galen ManilaVigil, Roderic Lammert E, RN Safety: Periods of time without injury will increase 05/31/2017 2233 - Not Progressing by Galen ManilaVigil, Anikin Prosser E, RN Activity: Interest or engagement in leisure activities will improve 05/31/2017 2233 - Not Progressing by Galen ManilaVigil, Myrikal Messmer E, RN Education: Utilization of techniques to improve thought processes will improve 05/31/2017 2233 - Not Progressing by Galen ManilaVigil, Zerina Hallinan E, RN Knowledge of the prescribed therapeutic regimen will improve 05/31/2017 2233 - Not Progressing by Galen ManilaVigil, Luvena Wentling E, RN Coping: Ability to cope will improve 05/31/2017 2233 - Not Progressing by Galen ManilaVigil, Kaytlyn Din E, RN Ability to verbalize feelings will improve 05/31/2017 2233 - Not Progressing by Galen ManilaVigil, Jamar Weatherall E, RN Role Relationship: Ability to demonstrate positive changes in social behaviors and relationships will improve 05/31/2017 2233 - Not Progressing by Galen ManilaVigil, Matraca Hunkins E, RN Safety: Ability to disclose and discuss suicidal ideas will improve 05/31/2017 2233 - Not Progressing by Galen ManilaVigil, Kc Sedlak E, RN Self-Concept: Ability to verbalize positive feelings about self will improve 05/31/2017 2233 - Not Progressing by Galen ManilaVigil, Estelene Carmack E, RN Pain Managment: General experience of comfort will improve 05/31/2017 2233 - Not Progressing by Galen ManilaVigil, Tavia Stave E, RN

## 2017-05-31 NOTE — [facility] Suicide Risk Assessment (Signed)
[facility]   Nursing information obtained from:    Demographic factors:    Current Mental Status:    Loss Factors:    Historical Factors:    Risk Reduction Factors:     Total Time spent with patient: 1 hour Principal Problem: Severe recurrent major depression without psychotic features [facility](HCC) Diagnosis:   Patient Active Problem List   Diagnosis Date Noted  . Severe recurrent major depression without psychotic features (HCC) [F33.2] 11/24/2014    Priority: High  . OCD (obsessive compulsive disorder) [F42.9] 05/31/2017  . Tobacco use disorder [F17.200] 11/25/2014  . Alcohol use disorder, severe, dependence (HCC) [F10.20] 11/25/2014  . Cannabis use disorder, severe, dependence (HCC) [F12.20] 11/25/2014  . 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 Opioid use disorder, moderate, dependence (HCC) [F11.20] 11/25/2014  . 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  Continued Clinical Symptoms:  Alcohol Use Disorder Identification Test Final Score (AUDIT): 13 The "Alcohol Use Disorders Identification Test", Guidelines for Use in Primary Care, Second Edition.  World Science writerHealth [employer] [facility](WHO). Score between 0-7:  no or low risk or alcohol related problems. Score between 8-15:  moderate risk of alcohol related problems. Score between 16-19:  high risk of alcohol related problems. Score 20 or above:  warrants further diagnostic evaluation for alcohol dependence and treatment.   CLINICAL FACTORS:   Depression:   Comorbid alcohol abuse/dependence Impulsivity Alcohol/Substance Abuse/Dependencies Obsessive-Compulsive Disorder   Musculoskeletal: Strength & Muscle Tone: within normal limits Gait & Station: normal Patient leans: N/A  Psychiatric Specialty Exam: Physical Exam  Nursing note and vitals  reviewed. Psychiatric: His speech is normal. His mood appears anxious. His affect is blunt. He is slowed and withdrawn. Cognition and memory are normal. He expresses impulsivity. He exhibits a depressed mood. He expresses suicidal ideation. He expresses suicidal plans.    Review of Systems  Constitutional: Positive for weight loss.  Neurological: Negative.   Psychiatric/Behavioral: Positive for depression, substance abuse and suicidal ideas. The patient has insomnia.   All other systems reviewed and are negative.   Blood pressure 137/82, pulse 60, temperature 98.6 F (37 C), temperature source Oral, resp. rate 18, height 5\' 6"  (1.676 m), weight 56.2 kg (124 lb), SpO2 100 %.Body mass index is 20.01 kg/m.  General Appearance: Casual  Eye Contact:  Good  Speech:  Clear and Coherent  Volume:  Normal  Mood:  Depressed  Affect:  Flat  Thought Process:  Goal Directed and Descriptions of Associations: Intact  Orientation:  Full (Time, Place, and Person)  Thought Content:  WDL  Suicidal Thoughts:  Yes.  with intent/plan  Homicidal Thoughts:  No  Memory:  Immediate;   Fair Recent;   Fair Remote;   Fair  Judgement:  Poor  Insight:  Shallow  Psychomotor Activity:  Psychomotor Retardation  Concentration:  Concentration: Fair and Attention Span: Fair  Recall:  [employer]Fair  Fund of Knowledge:  Fair  Language:  Fair  Akathisia:  No  Handed:  Right  AIMS (if indicated):     Assets:  Communication Skills Desire for Improvement Housing Physical Health Resilience Social Support  ADL's:  Intact  Cognition:  WNL  Sleep:         COGNITIVE FEATURES THAT CONTRIBUTE TO RISK:  None    SUICIDE RISK:   Moderate:  Frequent suicidal ideation with limited intensity, and duration, some specificity in terms of plans, no  associated intent, good self-control, limited dysphoria/symptomatology, some risk factors present, and identifiable protective factors, including available and accessible social  support.  PLAN OF CARE: hospital admission, medication management, substance abuse counseling, discharge planning.  Mr. Roselle is a 27 year old male with a history of depression and substance abuse admitted for suicidal ideation.  #Suicidal ideation -patient is able to contract for safety in the hospital  #Mood -restart Luvox 100 mg nightly -start Zyprexa 5 mg nightly -Trazodone 100 mg nightly  #Substance abuse -positive for cannabis -minimizes problems and declines treatment  #Weight loss -Ensure  #Metabolic syndrome monitoring -Lipid panel, TSH and HgbA1C are pending  #Disposition -discharge with family -needs follow up  I certify that inpatient services furnished can reasonably be expected to improve the patient's condition.   Kristine Linea, MD 05/31/2017, 4:22 PM

## 2017-05-31 NOTE — [facility] Group Notes (Signed)
[facility] Group Notes:  (Nursing/MHT/Case Management/Adjunct)  Date:  05/31/2017  Time:  10:32 PM  Type of Therapy:  Group Therapy  Participation Level:  Active  Participation Quality:  Appropriate  Affect:  Appropriate  Cognitive:  Alert  Insight:  Good  Engagement in Group:  Engaged  Modes of Intervention:  Support  Summary of Progress/Problems:  Roger [name] 05/31/2017, 10:32 PM

## 2017-05-31 NOTE — Tx Team (Signed)
Initial Treatment Plan 05/31/2017 4:36 PM Roger [name] [id number]    PATIENT STRESSORS: Financial difficulties Medication change or noncompliance Occupational concerns Substance abuse   PATIENT STRENGTHS: Ability for insight Active sense of humor Average or above average intelligence Capable of independent living Communication skills   PATIENT IDENTIFIED PROBLEMS: "stay sober"  Homeless  Gambling issues  Coping skills               DISCHARGE CRITERIA:  Adequate post-discharge living arrangements Improved stabilization in mood, thinking, and/or behavior  PRELIMINARY DISCHARGE PLAN: Outpatient therapy Placement in alternative living arrangements  PATIENT/FAMILY INVOLVEMENT: This treatment plan has been presented to and reviewed with the patient, Roger [name], and/or family member, .  The patient and family have been given the opportunity to ask questions and make suggestions.  Roger [name], Roger Eckerson B, RN 05/31/2017, 4:36 PM

## 2017-05-31 NOTE — ED Provider Notes (Signed)
-----------------------------------------   6:25 AM on 05/31/2017 -----------------------------------------   Blood pressure 117/74, pulse 70, temperature 98.2 F (36.8 C), temperature source Oral, resp. rate 16, height 5\' 6"  (1.676 m), weight 56.7 kg (125 lb), SpO2 99 %.  The patient had no acute events since last update.  Calm and cooperative at this time.  Disposition is pending Psychiatry/Behavioral Medicine team recommendations.     Irean HongSung, Jade J, MD 05/31/17 [phone]

## 2017-06-01 ENCOUNTER — Other Ambulatory Visit: Payer: Self-pay

## 2017-06-01 DIAGNOSIS — Z5181 Encounter for therapeutic drug level monitoring: Secondary | ICD-10-CM

## 2017-06-01 LAB — LIPID PANEL
CHOL/HDL RATIO: 3.7 ratio
Cholesterol: 139 mg/dL (ref 0–200)
HDL: 38 mg/dL — AB (ref >40)
LDL Cholesterol: 92 mg/dL (ref 0–99)
TRIGLYCERIDES: 46 mg/dL (ref <150)
VLDL: 9 mg/dL (ref 0–40)

## 2017-06-01 LAB — HEMOGLOBIN A1C
HEMOGLOBIN A1C: 5.7 % — AB (ref 4.8–5.6)
Mean Plasma Glucose: 116.89 mg/dL

## 2017-06-01 LAB — TSH: TSH: 1.169 u[IU]/mL (ref 0.350–4.500)

## 2017-06-01 NOTE — [facility] Suicide Risk Assessment (Signed)
[facility] INPATIENT:  Family/Significant Other Suicide Prevention Education  Suicide Prevention Education:  Education Completed; [facility], pt's grandmother, at [phone]  has been identified by the patient as the family member/significant other with whom the patient will be residing, and identified as the person(s) who will aid the patient in the event of a mental health crisis (suicidal ideations/suicide attempt).  With written consent from the patient, the family member/significant other has been provided the following suicide prevention education, prior to the and/or following the discharge of the patient.  The suicide prevention education provided includes the following:  Suicide risk factors  Suicide prevention and interventions  National Suicide Hotline telephone number  [facility] assessment telephone number  [facility] Assistance 911  [facility] and/or Residential Mobile Crisis Unit telephone number  Request made of family/significant other to:  Remove weapons (e.g., guns, rifles, knives), all items previously/currently identified as safety concern.    Remove drugs/medications (over-the-counter, prescriptions, illicit drugs), all items previously/currently identified as a safety concern.  The family member/significant other verbalizes understanding of the suicide prevention education information provided.  The family member/significant other agrees to remove the items of safety concern listed above.  Pt's grandmother added, "Alycia RossettiRyan needs some long care help. He's going to end up doing something bad to himself or someone else one. He has two sides to him. He is something. He started talking about kill himself or killing someone else. He said he'd do something bad so the police would have to kill him. Eventually he's going to end up doing something bad. All he talks about is taking his own life. This has been going on for so many years. He will not  take no medicine. I don't know what you guys can do for him, but he doesn't take medication. He'll take it like a day when he gets out but then he won't take anymore. He won't follow up with nobody. He might be okay if somebody would come out to the house. He's hard headed. He goes to those gambling things too. He'll really flip then. He'll get messed up for a while. Keep him as long as you can. He needs some serious help." Pt's grandmother reported that pt is able to return to her home and will not have access to weapons or other means of self harm.   Heidi DachKelsey Julyana Woolverton, LCSW 06/01/2017, 3:48 PM

## 2017-06-01 NOTE — Progress Notes (Signed)
Received Roger [name] this am before his breakfast, he endorsed feeling depressed, anxiety and passive SI at intervals without a plan. His EKG was completed this AM. He denied hallucinations. This PM after talking with the chaplain, depression and anxiety is decreased.

## 2017-06-01 NOTE — [facility] Group Notes (Signed)
LCSW Group Therapy Note  06/01/2017 1:00 PM  Type of Therapy and Topic:  Group Therapy:  Feelings around Relapse and Recovery  Participation Level:  Active   Description of Group:    Patients in this group will discuss emotions they experience before and after a relapse. They will process how experiencing these feelings, or avoidance of experiencing them, relates to having a relapse. Facilitator will guide patients to explore emotions they have related to recovery. Patients will be encouraged to process which emotions are more powerful. They will be guided to discuss the emotional reaction significant others in their lives may have to their relapse or recovery. Patients will be assisted in exploring ways to respond to the emotions of others without this contributing to a relapse.  Therapeutic Goals: 1. Patient will identify two or more emotions that lead to a relapse for them 2. Patient will identify two emotions that result when they relapse 3. Patient will identify two emotions related to recovery 4. Patient will demonstrate ability to communicate their needs through discussion and/or role plays   Summary of Patient Progress: Roger [name] actively participated in group.  He shared what "relapse" met to him ("whenever I go back to using drugs") and agreed that relapse is a part of recovery.  Roger [name] identified his own risk factor for relapse as his substance use and shared that fishing was an activity that he used to help him try to avoid relapse.  Roger [name] shared with group participants that he would like to focus on his recovery after leaving the hospital by seeking substance abuse treatment and start back fishing on a daily basis    Therapeutic Modalities:   Cognitive Behavioral Therapy Solution-Focused Therapy Assertiveness Training Relapse Prevention Therapy   Devona Konig, LCSW 06/01/2017 1:53 PM

## 2017-06-01 NOTE — Tx Team (Signed)
Interdisciplinary Treatment and Diagnostic Plan Update  06/01/2017 Time of Session: 10:30AM Roger [name] MRN: [id number]  Principal Diagnosis: Severe recurrent major depression without psychotic features [facility](HCC)  Secondary Diagnoses: Principal Problem:   Severe recurrent major depression without psychotic features (HCC) Active Problems:   Tobacco use disorder   Cannabis use disorder, severe, dependence (HCC)   Current Medications:  Current Facility-Administered Medications  Medication Dose Route Frequency Provider Last Rate Last Dose  . acetaminophen (TYLENOL) tablet 650 mg  650 mg Oral Q6H PRN Clapacs, John T, MD      . alum & mag hydroxide-simeth (MAALOX/MYLANTA) 200-200-20 MG/5ML suspension 30 mL  30 mL Oral Q4H PRN Clapacs, John T, MD      . feeding supplement (ENSURE ENLIVE) (ENSURE ENLIVE) liquid 237 mL  237 mL Oral TID BM Pucilowska, Jolanta B, MD   237 mL at 05/31/17 2011  . fluvoxaMINE (LUVOX) tablet 100 mg  100 mg Oral QHS Clapacs, John T, MD   100 mg at 05/31/17 2111  . hydrOXYzine (ATARAX/VISTARIL) tablet 50 mg  50 mg Oral Q6H PRN Clapacs, John T, MD      . magnesium hydroxide (MILK OF MAGNESIA) suspension 30 mL  30 mL Oral Daily PRN Clapacs, John T, MD      . OLANZapine (ZYPREXA) tablet 5 mg  5 mg Oral QHS Pucilowska, Jolanta B, MD   5 mg at 05/31/17 2111  . traZODone (DESYREL) tablet 100 mg  100 mg Oral QHS Clapacs, Jackquline DenmarkJohn T, MD   100 mg at 05/31/17 2111   PTA Medications: Medications Prior to Admission  Medication Sig Dispense Refill Last Dose  . fluticasone (FLONASE) 50 MCG/ACT nasal spray Place 1 spray into both nostrils 2 (two) times daily. 16 g 0   . fluvoxaMINE (LUVOX) 100 MG tablet Take 1 tablet (100 mg total) by mouth at bedtime. 30 tablet 0   . meloxicam (MOBIC) 15 MG tablet Take 1 tablet (15 mg total) by mouth daily. 30 tablet 0   . traZODone (DESYREL) 50 MG tablet Take 1 tablet (50 mg total) by mouth at bedtime as needed for sleep. 30 tablet 0     Patient  Stressors: Financial difficulties Medication change or noncompliance Occupational concerns Substance abuse  Patient Strengths: Ability for insight Active sense of humor Average or above average intelligence Capable of independent living Communication skills  Treatment Modalities: Medication Management, Group therapy, Case management,  1 to 1 session with clinician, Psychoeducation, Recreational therapy.   Physician Treatment Plan for Primary Diagnosis: Severe recurrent major depression without psychotic features (HCC) Long Term Goal(s): Improvement in symptoms so as ready for discharge Improvement in symptoms so as ready for discharge   Short Term Goals: Ability to identify changes in lifestyle to reduce recurrence of condition will improve Ability to verbalize feelings will improve Ability to disclose and discuss suicidal ideas Ability to demonstrate self-control will improve Ability to identify and develop effective coping behaviors will improve Ability to maintain clinical measurements within normal limits will improve Compliance with prescribed medications will improve Ability to identify triggers associated with substance abuse/mental health issues will improve Ability to identify changes in lifestyle to reduce recurrence of condition will improve Ability to demonstrate self-control will improve Ability to identify triggers associated with substance abuse/mental health issues will improve  Medication Management: Evaluate patient's response, side effects, and tolerance of medication regimen.  Therapeutic Interventions: 1 to 1 sessions, Unit Group sessions and Medication administration.  Evaluation of Outcomes: Progressing  Physician Treatment Plan  for Secondary Diagnosis: Principal Problem:   Severe recurrent major depression without psychotic features (HCC) Active Problems:   Tobacco use disorder   Cannabis use disorder, severe, dependence (HCC)  Long Term Goal(s):  Improvement in symptoms so as ready for discharge Improvement in symptoms so as ready for discharge   Short Term Goals: Ability to identify changes in lifestyle to reduce recurrence of condition will improve Ability to verbalize feelings will improve Ability to disclose and discuss suicidal ideas Ability to demonstrate self-control will improve Ability to identify and develop effective coping behaviors will improve Ability to maintain clinical measurements within normal limits will improve Compliance with prescribed medications will improve Ability to identify triggers associated with substance abuse/mental health issues will improve Ability to identify changes in lifestyle to reduce recurrence of condition will improve Ability to demonstrate self-control will improve Ability to identify triggers associated with substance abuse/mental health issues will improve     Medication Management: Evaluate patient's response, side effects, and tolerance of medication regimen.  Therapeutic Interventions: 1 to 1 sessions, Unit Group sessions and Medication administration.  Evaluation of Outcomes: Progressing   RN Treatment Plan for Primary Diagnosis: Severe recurrent major depression without psychotic features (HCC) Long Term Goal(s): Knowledge of disease and therapeutic regimen to maintain health will improve  Short Term Goals: Ability to verbalize feelings will improve, Ability to identify and develop effective coping behaviors will improve and Compliance with prescribed medications will improve  Medication Management: RN will administer medications as ordered by provider, will assess and evaluate patient's response and provide education to patient for prescribed medication. RN will report any adverse and/or side effects to prescribing provider.  Therapeutic Interventions: 1 on 1 counseling sessions, Psychoeducation, Medication administration, Evaluate responses to treatment, Monitor vital signs  and CBGs as ordered, Perform/monitor CIWA, COWS, AIMS and Fall Risk screenings as ordered, Perform wound care treatments as ordered.  Evaluation of Outcomes: Progressing   LCSW Treatment Plan for Primary Diagnosis: Severe recurrent major depression without psychotic features (HCC) Long Term Goal(s): Safe transition to appropriate next level of care at discharge, Engage patient in therapeutic group addressing interpersonal concerns.  Short Term Goals: Engage patient in aftercare planning with referrals and resources, Identify triggers associated with mental health/substance abuse issues and Increase skills for wellness and recovery  Therapeutic Interventions: Assess for all discharge needs, 1 to 1 time with Social worker, Explore available resources and support systems, Assess for adequacy in community support network, Educate family and significant other(s) on suicide prevention, Complete Psychosocial Assessment, Interpersonal group therapy.  Evaluation of Outcomes: Progressing   Progress in Treatment: Attending groups: Yes. Participating in groups: Yes. Taking medication as prescribed: Yes. Toleration medication: Yes. Family/Significant other contact made: No, will contact:  a support once pt provides consent. Patient understands diagnosis: Yes. Discussing patient identified problems/goals with staff: Yes. Medical problems stabilized or resolved: Yes. Denies suicidal/homicidal ideation: Yes. Issues/concerns per patient self-inventory: No. Other: None at this time.   New problem(s) identified: No, Describe:  None at this time.   New Short Term/Long Term Goal(s): Pt reported his goal for treatment is, "to get my head back on straight."   Discharge Plan or Barriers: CSW will continue to assess for appropriate discharge plan. Pt will discharge home and will continue treatment with [facility] in the outpatient setting.   Reason for Continuation of Hospitalization:  Anxiety Depression Medication stabilization  Estimated Length of Stay: 3-5 days  Attendees: Patient: Roger [name] 06/01/2017 11:22 AM  Physician: Dr. Jennet Maduro, MD 06/01/2017  11:22 AM  Nursing: Leonia Reader, RN 06/01/2017 11:22 AM  RN Care Manager: 06/01/2017 11:22 AM  Social Worker: Heidi Dach, LCSW 06/01/2017 11:22 AM  Recreational Therapist:  06/01/2017 11:22 AM  Other: Johny Shears, LCSWA 06/01/2017 11:22 AM  Other:  06/01/2017 11:22 AM  Other: 06/01/2017 11:22 AM     Scribe for Treatment Team: Heidi Dach, LCSW 06/01/2017 11:25 AM

## 2017-06-01 NOTE — Plan of Care (Signed)
Seen in the milieu, calm and cooperative. Compliant with treatment

## 2017-06-01 NOTE — Progress Notes (Signed)
Patient has been in the milieu, frequently walking in the hallway and the dayroom. Alert and oriented and denying thoughts of self harm. Denying hallucinations. Patient attended group and had a snack. Has been playing cards with peers. Pleasant and cooperative. Patient presented to the medication room and received his bed time medications. Reporting that he is feeling improvement. Knowledgeable of his medication regime. Support and encouragements provided. Safety and security maintained.

## 2017-06-01 NOTE — [facility] Suicide Risk Assessment (Addendum)
[facility] INPATIENT:  Family/Significant Other Suicide Prevention Education  Suicide Prevention Education:  Contact Attempts: [facility], pt's grandmother, at [phone] has been identified by the patient as the family member/significant other with whom the patient will be residing, and identified as the person(s) who will aid the patient in the event of a mental health crisis.  With written consent from the patient, two attempts were made to provide suicide prevention education, prior to and/or following the patient's discharge.  We were unsuccessful in providing suicide prevention education.  A suicide education pamphlet was given to the patient to share with family/significant other.  Date and time of first attempt: 06/01/17 at 1:00PM Date and time of second attempt:   Heidi DachKelsey Chatara Lucente, Alexander MtLCSW 06/01/2017, 1:02 PM

## 2017-06-01 NOTE — Progress Notes (Addendum)
Roger [name] Lincoln MD Progress Note  06/01/2017 9:33 AM Roger [name]  MRN:  [id number]  Subjective:   [name] met with treatment team today. He is still depressed and suicidal. Slept better. Tolerates medications well. Still unwilling to talk about substance use.  Treatment plan. He was erstarted on Luvox used in the past to address gambling and Zyprexa for mood stabilization.  Social/dipsosition. He will return with his grandmother. Follow up with [facility].  Principal Problem: Severe recurrent major depression without psychotic features (Roger [name]) Diagnosis:   Patient Active Problem List   Diagnosis Date Noted  . Severe recurrent major depression without psychotic features ([place]) [F33.2] 11/24/2014    Priority: High  . Tobacco use disorder [F17.200] 11/25/2014  . Alcohol use disorder, severe, dependence ([place]) [F10.20] 11/25/2014  . Cannabis use disorder, severe, dependence ([place]) [F12.20] 11/25/2014  . 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 Opioid use disorder, moderate, dependence ([place]) [F11.20] 11/25/2014  . 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  Past Medical History:  Past Medical History:  Diagnosis Date  . Headache     Past Surgical History:  Procedure Laterality Date  . FEMUR FRACTURE SURGERY     Family History:  Family History  Problem Relation Age of Onset  . Alcohol abuse Father    Family Psychiatric  History: bipolar in a sister and possibly the mother. Social History:  Social History   Substance and Sexual Activity  Alcohol Use Yes  . Alcohol/week: 6.0 oz  . Types: 3 Cans of beer, 7 Shots of liquor per week     Social History   Substance and Sexual Activity  Drug Use Yes  . Types: Amphetamines, Cocaine, Marijuana, Benzodiazepines, MDMA (Ecstacy), Hydrocodone     Social History   Socioeconomic History  . Marital status: Single    Spouse name: None  . Number of children: None  . Years of education: None  . Highest education level: None  Social Needs  . Financial resource strain: None  . Food insecurity - worry: None  . Food insecurity - inability: None  . Transportation needs - medical: None  . Transportation needs - non-medical: None  Occupational History  . None  Tobacco Use  . Smoking status: Current Every Day Smoker    Packs/day: 0.50    Years: 10.00    Pack years: 5.00    Types: Cigarettes  . Smokeless tobacco: Former Systems developer    Quit date: 11/24/2014  Substance and Sexual Activity  . Alcohol use: Yes    Alcohol/week: 6.0 oz    Types: 3 Cans of beer, 7 Shots of liquor per week  . Drug use: Yes    Types: Amphetamines, Cocaine, Marijuana, Benzodiazepines, MDMA (Ecstacy), Hydrocodone  . Sexual activity: Yes    Birth control/protection: None  Other Topics Concern  . None  Social History Narrative  . None   Additional Social History:                         Sleep: Fair  Appetite:  Poor  Current Medications: Current Facility-Administered Medications  Medication Dose Route Frequency Provider Last Rate Last Dose  . acetaminophen (TYLENOL) tablet 650 mg  650 mg Oral Q6H PRN Roger [name], Roger T, MD      . alum & mag hydroxide-simeth (MAALOX/MYLANTA) 200-200-20 MG/5ML suspension 30 mL  30 mL Oral Q4H PRN Roger [name], Roger T, MD      . feeding supplement (ENSURE ENLIVE) (ENSURE ENLIVE) liquid 237 mL  237 mL Oral TID Roger Irvin Bastin B, MD   237 mL at 05/31/17 2011  . fluvoxaMINE (LUVOX) tablet 100 mg  100 mg Oral QHS Roger [name], Roger T, MD   100 mg at 05/31/17 2111  . hydrOXYzine (ATARAX/VISTARIL) tablet 50 mg  50 mg Oral Q6H PRN Roger [name], Roger T, MD      . magnesium hydroxide (MILK OF MAGNESIA) suspension 30 mL  30 mL Oral Daily PRN Roger [name], Roger T, MD      . OLANZapine (ZYPREXA) tablet 5 mg  5 mg Oral QHS Roger Kenan B,  MD   5 mg at 05/31/17 2111  . traZODone (DESYREL) tablet 100 mg  100 mg Oral QHS Roger [name], Roger Ren[place], MD   100 mg at 05/31/17 2111    Lab Results:  Results for orders placed or performed during the hospital encounter of 05/31/17 (from the past 48 hour(s))  Lipid panel     Status: Abnormal   Collection Time: 06/01/17  7:43 AM  Result Value Ref Range   Cholesterol 139 0 - 200 mg/dL   Triglycerides 46 <150 mg/dL   HDL 38 (L) >40 mg/dL   Total CHOL/HDL Ratio 3.7 RATIO   VLDL 9 0 - 40 mg/dL   LDL Cholesterol 92 0 - 99 mg/dL    Comment:        Total Cholesterol/HDL:CHD Risk Coronary Heart Disease Risk Table                     Men   Women  1/2 Average Risk   3.4   3.3  Average Risk       5.0   4.4  2 X Average Risk   9.6   7.1  3 X Average Risk  23.4   11.0        Use the calculated Patient Ratio above and the CHD Risk Table to determine the patient's CHD Risk.        ATP III CLASSIFICATION (LDL):  <100     mg/dL   Optimal  100-129  mg/dL   Near or Above                    Optimal  130-159  mg/dL   Borderline  160-189  mg/dL   High  >190     mg/dL   Very High Performed at [facility], [address] [name], [place] [address]   TSH     Status: None   Collection Time: 06/01/17  7:43 AM  Result Value Ref Range   TSH 1.169 0.350 - 4.500 uIU/mL    Comment: Performed by a 3rd Generation assay with a functional sensitivity of <=0.01 uIU/mL. Performed at [name] [address] [place], [place] [address]     Blood Alcohol level:  Lab Results  Component Value Date   ETH <10 05/30/2017   ETH 24 (H) 90/24/0973    Metabolic Disorder Labs: Lab Results  Component Value Date   HGBA1C 6.0 12/25/2015   Lab Results  Component Value Date   PROLACTIN 25.5 (H) 12/25/2015   Lab Results  Component Value Date   CHOL 139 06/01/2017   TRIG 46 06/01/2017   HDL 38 (L) 06/01/2017   CHOLHDL 3.7 06/01/2017   VLDL 9 06/01/2017   LDLCALC 92 06/01/2017  [place]  80 12/25/2015    Physical Findings: AIMS: Facial and Oral Movements Muscles of Facial Expression: None, normal Lips and Perioral Area: None, normal Jaw: None, normal Tongue: None, normal,Extremity Movements Upper (arms, wrists, hands, fingers): None, normal Lower (legs, knees, ankles, toes): None, normal, Trunk Movements Neck, shoulders, hips: None, normal, Overall Severity Severity of abnormal movements (highest score from questions above): None, normal Incapacitation due to abnormal movements: None, normal Patient's awareness of abnormal movements (rate only patient's report): No Awareness, Dental Status Current problems with teeth and/or dentures?: Yes Does patient usually wear dentures?: No  CIWA:  CIWA-Ar Total: 2 COWS:     Musculoskeletal: Strength & Muscle Tone: within normal limits Gait & Station: normal Patient leans: N/A  Psychiatric Specialty Exam: Physical Exam  Nursing note and vitals reviewed. Psychiatric: His speech is normal. His affect is blunt. He is slowed and withdrawn. Cognition and memory are normal. He expresses impulsivity. He exhibits a depressed mood. He expresses suicidal ideation.    Review of Systems  Neurological: Negative.   Psychiatric/Behavioral: Positive for depression, substance abuse and suicidal ideas.  All other systems reviewed and are negative.   Blood pressure 101/67, pulse (!) 51, temperature 98.5 F (36.9 C), temperature source Oral, resp. rate 20, height _0  (1.676 m), weight 56.2 kg (124 lb), SpO2 100 %.Body mass index is 20.01 kg/m.  General Appearance: Fairly Groomed  Eye Contact:  Good  Speech:  Clear and Coherent  Volume:  Normal  Mood:  Depressed and Hopeless  Affect:  Flat  Thought Process:  Goal Directed and Descriptions of Associations: Intact  Orientation:  Full (Time, Place, and Person)  Thought Content:  WDL  Suicidal Thoughts:  Yes.  with intent/plan  Homicidal Thoughts:  No  Memory:  Immediate;    Fair Recent;   Fair Remote;   Fair  Judgement:  Poor  Insight:  Lacking  Psychomotor Activity:  Psychomotor Retardation  Concentration:  Concentration: Fair and Attention Span: Fair  Recall:  [employer] of Knowledge:  Fair  Language:  Fair  Akathisia:  No  Handed:  Right  AIMS (if indicated):     Assets:  Communication Skills Desire for Improvement Housing Physical Health Resilience Social Support  ADL's:  Intact  Cognition:  WNL  Sleep:  Number of Hours: 7.5     Treatment Plan Summary: Daily contact with patient to assess and evaluate symptoms and progress in treatment and Medication management   Mr. Demarest is a 27 year old male with a history of depression and substance abuse admitted for suicidal ideation.  #Suicidal ideation -patient is able to contract for safety in the hospital  #Mood -continue Luvox 100 mg nightly -continue Zyprexa 5 mg nightly -Trazodone 100 mg nightly  #Substance abuse -denies heavy drinking, VS are stable -positive for cannabis -minimizes problems and declines treatment  #Weight loss -Ensure  #Metabolic syndrome monitoring -Lipid panel and TSH are unremarkable -HgbA1C pending -EKG, QTc 392  #Disposition -discharge with family -needs follow up     Orson Slick, MD 06/01/2017, 9:33 AM
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
## 2017-06-02 NOTE — [facility] Group Notes (Signed)
LCSW Group Therapy Note  06/02/2017 1:15pm  Type of Therapy and Topic:  Group Therapy:  Fears and Unhealthy Coping Skills  Participation Level:  Did Not Attend   Description of Group:  The focus of this group was to discuss some of the prevalent fears that patients experience, and to identify the commonalities among group members.  An exercise was used to initiate the discussion, followed by writing on the white board a group-generated list of unhealthy coping and healthy coping techniques to deal with each fear.    Therapeutic Goals: 1. Patient will identify and describe 3 fears they experience 2. Patient will identify one positive coping strategy for each fear they experience 3. Patient will respond empathically to peers statements regarding fears they experience  Summary of Patient Progress:       Therapeutic Modalities Cognitive Behavioral Therapy Motivational Interviewing  Isel Skufca  CUEBAS-COLON, LCSW 06/02/2017 11:40 AM

## 2017-06-02 NOTE — [facility] Group Notes (Signed)
[facility] Group Notes:  (Nursing/MHT/Case Management/Adjunct)  Date:  06/02/2017  Time:  5:57 AM  Type of Therapy:  Psychoeducational Skills  Participation Level:  Active  Participation Quality:  Appropriate, Attentive and Sharing  Affect:  Appropriate  Cognitive:  Appropriate  Insight:  Appropriate and Good  Engagement in Group:  Engaged  Modes of Intervention:  Discussion, Socialization and Support  Summary of Progress/Problems:  Chancy MilroyLaquanda Y Marquon Alcala 06/02/2017, 5:57 AM

## 2017-06-02 NOTE — Progress Notes (Signed)
Patient has remained in the milieu. Alert and oriented. Pleasant and cooperative. Interacting with staff and peers appropriately. Denying thoughts of self harm. Denying hallucinations. No sign of distress. Patient attended group and was appropriate. Played cards with peers and maintained a pleasant attitude. Presented to the medication room, discussed his medication regime and verbalized understanding. Patient remains compliant with treatment plan. Was encouraged to talk to staff and express his concerns and feelings as needed. Safety precautions maintained at Q 15 level of observation.

## 2017-06-02 NOTE — Plan of Care (Signed)
Thought process organized. Pleasant and cooperative. Compliant with treatment plan

## 2017-06-02 NOTE — Progress Notes (Signed)
[facility] MD Progress Note  06/02/2017 1:23 PM Roger [name]  MRN:  [id number] Subjective: Follow-up note for 27 year old man with depression.  Patient seen chart reviewed.  Patient says he is perhaps feeling slightly better but still feels pretty down and hopeless.  Does not feel very hungry.  He has suicidal thoughts but not with any intention of acting on it but still has a hard time thinking positively about the future.  No sign of psychosis.  Basically cooperative with treatment. Principal Problem: Severe recurrent major depression without psychotic features (HCC) Diagnosis:   Patient Active Problem List   Diagnosis Date Noted  . Tobacco use disorder [F17.200] 11/25/2014  . Alcohol use disorder, severe, dependence (HCC) [F10.20] 11/25/2014  . Cannabis use disorder, severe, dependence (HCC) [F12.20] 11/25/2014  . 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 Opioid use disorder, moderate, dependence (HCC) [F11.20] 11/25/2014  . 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 Severe recurrent major depression without psychotic features (HCC) [F33.2] 11/24/2014   Total Time spent with patient: 20 minutes  Past Psychiatric History: Patient has a history of depression and suicidality in the past  Past Medical History:  Past Medical History:  Diagnosis Date  . Headache     Past Surgical History:  Procedure Laterality Date  . FEMUR FRACTURE SURGERY     Family History:  Family History  Problem Relation Age of Onset  . Alcohol abuse Father    Family Psychiatric  History: See previous notes Social History:  Social History   Substance and Sexual Activity  Alcohol Use Yes  . Alcohol/week: 6.0 oz  . Types: 3 Cans of beer, 7 Shots of liquor per week     Social History   Substance and Sexual Activity  Drug Use Yes  . Types: Amphetamines, Cocaine, Marijuana, Benzodiazepines, MDMA  (Ecstacy), Hydrocodone    Social History   Socioeconomic History  . Marital status: Single    Spouse name: None  . Number of children: None  . Years of education: None  . Highest education level: None  Social Needs  . Financial resource strain: None  . Food insecurity - worry: None  . Food insecurity - inability: None  . Transportation needs - medical: None  . Transportation needs - non-medical: None  Occupational History  . None  Tobacco Use  . Smoking status: Current Every Day Smoker    Packs/day: 0.50    Years: 10.00    Pack years: 5.00    Types: Cigarettes  . Smokeless tobacco: Former Neurosurgeon    Quit date: 11/24/2014  Substance and Sexual Activity  . Alcohol use: Yes    Alcohol/week: 6.0 oz    Types: 3 Cans of beer, 7 Shots of liquor per week  . Drug use: Yes    Types: Amphetamines, Cocaine, Marijuana, Benzodiazepines, MDMA (Ecstacy), Hydrocodone  . Sexual activity: Yes    Birth control/protection: None  Other Topics Concern  . None  Social History Narrative  . None   Additional Social History:                         Sleep: Fair  Appetite:  Fair  Current Medications: Current Facility-Administered Medications  Medication Dose Route Frequency Provider Last Rate Last Dose  . acetaminophen (TYLENOL) tablet 650 mg  650 mg Oral Q6H PRN Ekaterina Denise, Jackquline Denmark, MD   650 mg at 06/02/17 0957  . alum &  mag hydroxide-simeth (MAALOX/MYLANTA) 200-200-20 MG/5ML suspension 30 mL  30 mL Oral Q4H PRN Shlonda Dolloff T, MD      . feeding supplement (ENSURE ENLIVE) (ENSURE ENLIVE) liquid 237 mL  237 mL Oral TID BM Pucilowska, Jolanta B, MD   237 mL at 06/02/17 0931  . fluvoxaMINE (LUVOX) tablet 100 mg  100 mg Oral QHS Symphanie Cederberg T, MD   100 mg at 06/01/17 2209  . hydrOXYzine (ATARAX/VISTARIL) tablet 50 mg  50 mg Oral Q6H PRN Kwaku Mostafa T, MD      . magnesium hydroxide (MILK OF MAGNESIA) suspension 30 mL  30 mL Oral Daily PRN Aleyssa Pike T, MD      . OLANZapine (ZYPREXA)  tablet 5 mg  5 mg Oral QHS Pucilowska, Jolanta B, MD   5 mg at 06/01/17 2209  . traZODone (DESYREL) tablet 100 mg  100 mg Oral QHS Vantasia Pinkney, Jackquline Denmark, MD   100 mg at 06/01/17 2209    Lab Results:  Results for orders placed or performed during the hospital encounter of 05/31/17 (from the past 48 hour(s))  Hemoglobin A1c     Status: Abnormal   Collection Time: 06/01/17  7:43 AM  Result Value Ref Range   Hgb A1c MFr Bld 5.7 (H) 4.8 - 5.6 %    Comment: (NOTE) Pre diabetes:          5.7%-6.4% Diabetes:              >6.4% Glycemic control for   <7.0% adults with diabetes    Mean Plasma Glucose 116.89 mg/dL    Comment: Performed at [facility], [address], [place], [place] [address]  Lipid panel     Status: Abnormal   Collection Time: 06/01/17  7:43 AM  Result Value Ref Range   Cholesterol 139 0 - 200 mg/dL   Triglycerides 46 <604 mg/dL   HDL 38 (L) >54 mg/dL   Total CHOL/HDL Ratio 3.7 RATIO   VLDL 9 0 - 40 mg/dL   LDL Cholesterol 92 0 - 99 mg/dL    Comment:        Total Cholesterol/HDL:CHD Risk Coronary Heart Disease Risk Table                     Men   Women  1/2 Average Risk   3.4   3.3  Average Risk       5.0   4.4  2 X Average Risk   9.6   7.1  3 X Average Risk  23.4   11.0        Use the calculated Patient Ratio above and the CHD Risk Table to determine the patient's CHD Risk.        ATP III CLASSIFICATION (LDL):  <100     mg/dL   Optimal  098-119  mg/dL   Near or Above                    Optimal  130-159  mg/dL   Borderline  147-829  mg/dL   High  >562     mg/dL   Very High Performed at [facility], [address] [place], [place] [address]   TSH     Status: None   Collection Time: 06/01/17  7:43 AM  Result Value Ref Range   TSH 1.169 0.350 - 4.500 uIU/mL    Comment: Performed by a 3rd Generation assay with a functional sensitivity of <=0.01 uIU/mL.  Performed at [facility], [address] [place], [place] [address]     Blood  Alcohol level:  Lab Results  Component Value Date   ETH <10 05/30/2017   ETH 24 (H) 04/14/2016    Metabolic Disorder Labs: Lab Results  Component Value Date   HGBA1C 5.7 (H) 06/01/2017   MPG 116.89 06/01/2017   Lab Results  Component Value Date   PROLACTIN 25.5 (H) 12/25/2015   Lab Results  Component Value Date   CHOL 139 06/01/2017   TRIG 46 06/01/2017   HDL 38 (L) 06/01/2017   CHOLHDL 3.7 06/01/2017   VLDL 9 06/01/2017   LDLCALC 92 06/01/2017   LDLCALC 80 12/25/2015    Physical Findings: AIMS: Facial and Oral Movements Muscles of Facial Expression: None, normal Lips and Perioral Area: None, normal Jaw: None, normal Tongue: None, normal,Extremity Movements Upper (arms, wrists, hands, fingers): None, normal Lower (legs, knees, ankles, toes): None, normal, Trunk Movements Neck, shoulders, hips: None, normal, Overall Severity Severity of abnormal movements (highest score from questions above): None, normal Incapacitation due to abnormal movements: None, normal Patient's awareness of abnormal movements (rate only patient's report): No Awareness, Dental Status Current problems with teeth and/or dentures?: Yes Does patient usually wear dentures?: No  CIWA:  CIWA-Ar Total: 2 COWS:     Musculoskeletal: Strength & Muscle Tone: within normal limits Gait & Station: normal Patient leans: N/A  Psychiatric Specialty Exam: Physical Exam  Nursing note and vitals reviewed. Constitutional: He appears well-developed and well-nourished.  HENT:  Head: Normocephalic and atraumatic.  Eyes: Conjunctivae are normal. Pupils are equal, round, and reactive to light.  Neck: Normal range of motion.  Cardiovascular: Regular rhythm and normal heart sounds.  Respiratory: Effort normal. No respiratory distress.  GI: Soft.  Musculoskeletal: Normal range of motion.  Neurological: He is alert.  Skin: Skin is warm and dry.  Psychiatric: Judgment normal. His affect is blunt. His speech is  delayed. He is withdrawn. Thought content is not paranoid. Cognition and memory are normal. He exhibits a depressed mood. He expresses suicidal ideation. He expresses no homicidal ideation. He expresses no suicidal plans.    Review of Systems  Constitutional: Negative.   HENT: Negative.   Eyes: Negative.   Respiratory: Negative.   Cardiovascular: Negative.   Gastrointestinal: Negative.   Musculoskeletal: Negative.   Skin: Negative.   Neurological: Negative.   Psychiatric/Behavioral: Positive for depression and suicidal ideas. Negative for hallucinations, memory loss and substance abuse. The patient is nervous/anxious and has insomnia.     Blood pressure 113/72, pulse (!) 51, temperature 97.9 F (36.6 C), temperature source Oral, resp. rate 20, height 5\' 6"  (1.676 m), weight 56.2 kg (124 lb), SpO2 97 %.Body mass index is 20.01 kg/m.  General Appearance: Casual  Eye Contact:  Minimal  Speech:  Slow  Volume:  Decreased  Mood:  Depressed and Dysphoric  Affect:  Constricted  Thought Process:  Goal Directed  Orientation:  Full (Time, Place, and Person)  Thought Content:  Logical  Suicidal Thoughts:  Yes.  without intent/plan  Homicidal Thoughts:  No  Memory:  Immediate;   Fair Recent;   Fair Remote;   Fair  Judgement:  Fair  Insight:  Fair  Psychomotor Activity:  Decreased  Concentration:  Concentration: Fair  Recall:  [employer] of Knowledge:  Fair  Language:  Fair  Akathisia:  Negative  Handed:  Right  AIMS (if indicated):     Assets:  Desire for Improvement Physical Health Resilience  Social Support  ADL's:  Intact  Cognition:  WNL  Sleep:  Number of Hours: 4.45     Treatment Plan Summary: Daily contact with patient to assess and evaluate symptoms and progress in treatment, Medication management and Plan 27 year old man with major depression.  Still not sleeping well and very low energy.  He requests that he get his Ensure.  I noticed that the order has been written  and I am not sure why he has not been getting it.  Patient encouraged to continue being positive and trying to attend groups.  No change to medicine for today.  Mordecai RasmussenJohn Morenike Cuff, MD 06/02/2017, 1:23 PM

## 2017-06-02 NOTE — Plan of Care (Signed)
  Progressing Education: Knowledge of [facility] Education information/materials will improve 06/02/2017 1043 - Progressing by Weldon PickingJones, [name] E, RN Safety: Periods of time without injury will increase 06/02/2017 1043 - Progressing by Weldon PickingJones, [name] E, RN Coping: Ability to verbalize feelings will improve 06/02/2017 1043 - Progressing by Weldon PickingJones, [name] E, RN Safety: Ability to disclose and discuss suicidal ideas will improve 06/02/2017 1043 - Progressing by Weldon PickingJones, [name] E, RN Self-Concept: Ability to verbalize positive feelings about self will improve 06/02/2017 1043 - Progressing by Weldon PickingJones, [name] E, RN

## 2017-06-02 NOTE — Progress Notes (Signed)
Signed          Patient has been in the milieu, frequently walking in the hallway and the dayroom. Alert and oriented and denying thoughts of self harm. Denies  hallucinations and attended group.  Pleasant and cooperative, compliant with medications and meals. Reporting that  Knowledgeable of his medication regime. Support and encouragements provided. Safety and security maintained with q 15 minute safety checks.

## 2017-06-03 DIAGNOSIS — F332 Major depressive disorder, recurrent severe without psychotic features: Principal | ICD-10-CM

## 2017-06-03 NOTE — Progress Notes (Signed)
[facility] MD Progress Note  06/03/2017 3:51 PM Roger [name]  MRN:  [id number] Subjective: Patient with a history of depression who has been in the hospital a couple of days.  He says he is feeling better today.  He has better insight into how his substance abuse is causing problems for him.  He says he is focused on trying to stay sober when he gets out of the hospital.  Denies any current suicidal thoughts.  No physical complaints. Principal Problem: Severe recurrent major depression without psychotic features (HCC) Diagnosis:   Patient Active Problem List   Diagnosis Date Noted  . Tobacco use disorder [F17.200] 11/25/2014  . Alcohol use disorder, severe, dependence (HCC) [F10.20] 11/25/2014  . Cannabis use disorder, severe, dependence (HCC) [F12.20] 11/25/2014  . 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 Opioid use disorder, moderate, dependence (HCC) [F11.20] 11/25/2014  . 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 Severe recurrent major depression without psychotic features (HCC) [F33.2] 11/24/2014   Total Time spent with patient: 20 minutes  Past Psychiatric History: History of depression and substance abuse  Past Medical History:  Past Medical History:  Diagnosis Date  . Headache     Past Surgical History:  Procedure Laterality Date  . FEMUR FRACTURE SURGERY     Family History:  Family History  Problem Relation Age of Onset  . Alcohol abuse Father    Family Psychiatric  History: None Social History:  Social History   Substance and Sexual Activity  Alcohol Use Yes  . Alcohol/week: 6.0 oz  . Types: 3 Cans of beer, 7 Shots of liquor per week     Social History   Substance and Sexual Activity  Drug Use Yes  . Types: Amphetamines, Cocaine, Marijuana, Benzodiazepines, MDMA (Ecstacy), Hydrocodone    Social History   Socioeconomic History  . Marital  status: Single    Spouse name: None  . Number of children: None  . Years of education: None  . Highest education level: None  Social Needs  . Financial resource strain: None  . Food insecurity - worry: None  . Food insecurity - inability: None  . Transportation needs - medical: None  . Transportation needs - non-medical: None  Occupational History  . None  Tobacco Use  . Smoking status: Current Every Day Smoker    Packs/day: 0.50    Years: 10.00    Pack years: 5.00    Types: Cigarettes  . Smokeless tobacco: Former Neurosurgeon    Quit date: 11/24/2014  Substance and Sexual Activity  . Alcohol use: Yes    Alcohol/week: 6.0 oz    Types: 3 Cans of beer, 7 Shots of liquor per week  . Drug use: Yes    Types: Amphetamines, Cocaine, Marijuana, Benzodiazepines, MDMA (Ecstacy), Hydrocodone  . Sexual activity: Yes    Birth control/protection: None  Other Topics Concern  . None  Social History Narrative  . None   Additional Social History:                         Sleep: Fair  Appetite:  Fair  Current Medications: Current Facility-Administered Medications  Medication Dose Route Frequency Provider Last Rate Last Dose  . acetaminophen (TYLENOL) tablet 650 mg  650 mg Oral Q6H PRN Clapacs, Jackquline Denmark, MD   650 mg at 06/02/17 0957  . alum & mag hydroxide-simeth (MAALOX/MYLANTA) 200-200-20 MG/5ML suspension 30  mL  30 mL Oral Q4H PRN Clapacs, John T, MD      . feeding supplement (ENSURE ENLIVE) (ENSURE ENLIVE) liquid 237 mL  237 mL Oral TID BM Pucilowska, Jolanta B, MD   237 mL at 06/03/17 1030  . fluvoxaMINE (LUVOX) tablet 100 mg  100 mg Oral QHS Clapacs, John T, MD   100 mg at 06/02/17 2227  . hydrOXYzine (ATARAX/VISTARIL) tablet 50 mg  50 mg Oral Q6H PRN Clapacs, Jackquline DenmarkJohn T, MD   50 mg at 06/03/17 1253  . magnesium hydroxide (MILK OF MAGNESIA) suspension 30 mL  30 mL Oral Daily PRN Clapacs, John T, MD      . OLANZapine (ZYPREXA) tablet 5 mg  5 mg Oral QHS Pucilowska, Jolanta B, MD   5 mg at  06/02/17 2227  . traZODone (DESYREL) tablet 100 mg  100 mg Oral QHS Clapacs, John T, MD   100 mg at 06/02/17 2227    Lab Results: No results found for this or any previous visit (from the past 48 hour(s)).  Blood Alcohol level:  Lab Results  Component Value Date   ETH <10 05/30/2017   ETH 24 (H) 04/14/2016    Metabolic Disorder Labs: Lab Results  Component Value Date   HGBA1C 5.7 (H) 06/01/2017   MPG 116.89 06/01/2017   Lab Results  Component Value Date   PROLACTIN 25.5 (H) 12/25/2015   Lab Results  Component Value Date   CHOL 139 06/01/2017   TRIG 46 06/01/2017   HDL 38 (L) 06/01/2017   CHOLHDL 3.7 06/01/2017   VLDL 9 06/01/2017   LDLCALC 92 06/01/2017   LDLCALC 80 12/25/2015    Physical Findings: AIMS: Facial and Oral Movements Muscles of Facial Expression: None, normal Lips and Perioral Area: None, normal Jaw: None, normal Tongue: None, normal,Extremity Movements Upper (arms, wrists, hands, fingers): None, normal Lower (legs, knees, ankles, toes): None, normal, Trunk Movements Neck, shoulders, hips: None, normal, Overall Severity Severity of abnormal movements (highest score from questions above): None, normal Incapacitation due to abnormal movements: None, normal Patient's awareness of abnormal movements (rate only patient's report): No Awareness, Dental Status Current problems with teeth and/or dentures?: Yes Does patient usually wear dentures?: No  CIWA:  CIWA-Ar Total: 2 COWS:     Musculoskeletal: Strength & Muscle Tone: within normal limits Gait & Station: normal Patient leans: Backward  Psychiatric Specialty Exam: Physical Exam  Nursing note and vitals reviewed. Constitutional: He appears well-developed and well-nourished.  HENT:  Head: Normocephalic and atraumatic.  Eyes: Conjunctivae are normal. Pupils are equal, round, and reactive to light.  Neck: Normal range of motion.  Cardiovascular: Regular rhythm and normal heart sounds.   Respiratory: Effort normal. No respiratory distress.  GI: Soft.  Musculoskeletal: Normal range of motion.  Neurological: He is alert.  Skin: Skin is warm and dry.  Psychiatric: Judgment and thought content normal. His affect is blunt. His speech is delayed. He is slowed. Cognition and memory are normal.    Review of Systems  Constitutional: Negative.   HENT: Negative.   Eyes: Negative.   Respiratory: Negative.   Cardiovascular: Negative.   Gastrointestinal: Negative.   Musculoskeletal: Negative.   Skin: Negative.   Neurological: Negative.   Psychiatric/Behavioral: Negative.     Blood pressure 125/78, pulse (!) 53, temperature 98 F (36.7 C), temperature source Oral, resp. rate 20, height 5\' 6"  (1.676 m), weight 56.2 kg (124 lb), SpO2 100 %.Body mass index is 20.01 kg/m.  General Appearance: Casual  Eye Contact:  Minimal  Speech:  Slow  Volume:  Decreased  Mood:  Dysphoric  Affect:  Constricted  Thought Process:  Goal Directed  Orientation:  Full (Time, Place, and Person)  Thought Content:  Logical  Suicidal Thoughts:  No  Homicidal Thoughts:  No  Memory:  Immediate;   Fair Recent;   Fair Remote;   Fair  Judgement:  Fair  Insight:  Fair  Psychomotor Activity:  Normal  Concentration:  Concentration: Fair  Recall:  [employer] of Knowledge:  Fair  Language:  Fair  Akathisia:  No  Handed:  Right  AIMS (if indicated):     Assets:  Desire for Improvement Housing Physical Health Resilience Social Support  ADL's:  Intact  Cognition:  WNL  Sleep:  Number of Hours: 5.5     Treatment Plan Summary: Daily contact with patient to assess and evaluate symptoms and progress in treatment, Medication management and Plan No change to medication management.  Supportive counseling.  Reviewed treatment plan.  Patient does not appear to be at high risk currently for suicidal behavior.  Encouraged him to talk with his psychiatrist tomorrow about discharge planning.  Mordecai Rasmussen,  MD 06/03/2017, 3:51 PM

## 2017-06-03 NOTE — Plan of Care (Signed)
Visible in the milieu, participating in different activities

## 2017-06-03 NOTE — [facility] Group Notes (Signed)
[facility] Group Notes:  (Nursing/MHT/Case Management/Adjunct)  Date:  06/03/2017  Time:  7:52 AM  Type of Therapy:  Psychoeducational Skills  Participation Level:  Active  Participation Quality:  Appropriate, Attentive and Sharing  Affect:  Appropriate  Cognitive:  Appropriate  Insight:  Appropriate and Good  Engagement in Group:  Engaged  Modes of Intervention:  Discussion, Socialization and Support  Summary of Progress/Problems:  Roger MilroyLaquanda [name] [name] 06/03/2017, 7:52 AM

## 2017-06-03 NOTE — Progress Notes (Signed)
Patient has been in the milieu, calm and cooperative. Alert and oriented and denying thoughts of self harm. Denying hallucinations. Attended group and other activities. Reports that he is feeling improvement and states "I am ready to move on..ibuprofen need to get myself together". Was encouraged to express his thoughts and feelings as needed. Safety and security maintained.

## 2017-06-03 NOTE — [facility] Group Notes (Signed)
LCSW Group Therapy Note 06/03/2017 1:15pm  Type of Therapy and Topic: Group Therapy: Feelings Around Returning Home & Establishing a Supportive Framework and Supporting Oneself When Supports Not Available  Participation Level: Active  Description of Group:  Patients first processed thoughts and feelings about upcoming discharge. These included fears of upcoming changes, lack of change, new living environments, judgements and expectations from others and overall stigma of mental health issues. The group then discussed the definition of a supportive framework, what that looks and feels like, and how do to discern it from an unhealthy non-supportive network. The group identified different types of supports as well as what to do when your family/friends are less than helpful or unavailable  Therapeutic Goals  1. Patient will identify one healthy supportive network that they can use at discharge. 2. Patient will identify one factor of a supportive framework and how to tell it from an unhealthy network. 3. Patient able to identify one coping skill to use when they do not have positive supports from others. 4. Patient will demonstrate ability to communicate their needs through discussion and/or role plays.  Summary of Patient Progress:  Pt reports he feels "anxious". Pt engaged during group session. As patients processed their anxiety about discharge and described healthy supports patient shared he is looking forward to returning home and get a job. Pt stated that his fears are around the people he is surrounded by. Patients identified at least one self-care tool they were willing to use after discharge. Pt stated that he is going to reach out to AA or NA meetings.   Therapeutic Modalities Cognitive Behavioral Therapy Motivational Interviewing   Johnnye SimaNNIA  CUEBAS-COLON, LCSW 06/03/2017 12:43 PM

## 2017-06-03 NOTE — Plan of Care (Signed)
Visible in the milieu, calm and cooperative

## 2017-06-04 MED ORDER — OLANZAPINE 10 MG PO TABS: 10.0000 mg | ORAL_TABLET | Freq: Every day | ORAL | 1 refills | Status: DC

## 2017-06-04 MED ORDER — OLANZAPINE 10 MG PO TABS: 10.0000 mg | ORAL_TABLET | Freq: Every day | ORAL | Status: DC

## 2017-06-04 MED ORDER — TRAZODONE HCL 100 MG PO TABS: 100.0000 mg | ORAL_TABLET | Freq: Every day | ORAL | 1 refills | Status: DC

## 2017-06-04 MED ORDER — FLUVOXAMINE MALEATE 100 MG PO TABS: 100.0000 mg | ORAL_TABLET | Freq: Every day | ORAL | 1 refills | Status: DC
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
## 2017-06-04 NOTE — Progress Notes (Signed)
Patient stated that he will come and pick up the 7 days medicine this afternoon.

## 2017-06-04 NOTE — [facility] Suicide Risk Assessment (Signed)
[facility]   Principal Problem: Severe recurrent major depression without psychotic features [facility](HCC) Discharge Diagnoses:  Patient Active Problem List   Diagnosis Date Noted  . Severe recurrent major depression without psychotic features (HCC) [F33.2] 11/24/2014    Priority: High  . Tobacco use disorder [F17.200] 11/25/2014  . Alcohol use disorder, severe, dependence (HCC) [F10.20] 11/25/2014  . Cannabis use disorder, severe, dependence (HCC) [F12.20] 11/25/2014  . 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 Opioid use disorder, moderate, dependence (HCC) [F11.20] 11/25/2014  . 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   Psychiatric/Behavioral: Positive for substance abuse.  All other systems reviewed and are negative.   Blood pressure 126/71, pulse (!) 52, temperature 98 F (36.7 C), temperature source Oral, resp. rate 18, height 5\' 6"  (1.676 m), weight 56.2 kg (124 lb), SpO2 100 %.Body mass index is 20.01 kg/m.  General Appearance: Casual  Eye Contact::  Good  Speech:  Clear and Coherent409  Volume:  Normal  Mood:  Euthymic  Affect:  Appropriate  Thought Process:  Goal Directed and Descriptions of Associations: Intact  Orientation:  Full (Time, Place, and Person)  Thought Content:  WDL  Suicidal Thoughts:  No  Homicidal Thoughts:  No  Memory:  Immediate;   Fair Recent;   Fair Remote;   Fair  Judgement:  Poor  Insight:  Shallow  Psychomotor Activity:  Normal  Concentration:  Fair  Recall:  FiservFair  Fund of Knowledge:Fair  Language: Fair  Akathisia:  No  Handed:  Right  AIMS (if indicated):     Assets:   Communication Skills Desire for Improvement Housing Physical Health Resilience Social Support  Sleep:  Number of Hours: 6.5  Cognition: WNL  ADL's:  Intact   Mental Status Per Nursing Assessment::   On Admission:     Demographic Factors:  Male, Adolescent or young adult, Low socioeconomic status and Unemployed  Loss Factors: Decrease in vocational status and Financial problems/change in socioeconomic status  Historical Factors: Prior suicide attempts and Impulsivity  Risk Reduction Factors:   Sense of responsibility to family, Living with another person, especially a relative and Positive social support  Continued Clinical Symptoms:  Bipolar Disorder:   Depressive phase Alcohol/Substance Abuse/Dependencies  Cognitive Features That Contribute To Risk:  None    Suicide Risk:  Minimal: No identifiable suicidal ideation.  Patients presenting with no risk factors but with morbid ruminations; may be classified as minimal risk based on the severity of the depressive symptoms  Follow-up Information    [facility] Go on 06/08/2017.   Why:  Please go to your follow up appointment with [name], Peer Support Specialist, for [facility] on Friday, 06/08/17 at 7:15AM. Thank you.  Contact information: [address] [place] [address] [phone]           Plan Of Care/Follow-up recommendations:  Activity:  as tolerated Diet:  regular Other:  keep follow up appointments  Kristine LineaJolanta Pucilowska, MD 06/04/2017, 10:20 AM

## 2017-06-04 NOTE — Progress Notes (Signed)
  [facility] :  Will you be returning to the same living situation after discharge:  Yes,  returning to grandmother's home. At discharge, do you have transportation home?: Yes,  pt's grandmother. Do you have the ability to pay for your medications: Yes,  medication management clinic.  Release of information consent forms completed and in the chart;  Patient's signature needed at discharge.  Patient to Follow up at: Follow-up Information    [facility] Go on 06/08/2017.   Why:  Please go to your follow up appointment with [name], Peer Support Specialist, [facility] on Friday, 06/08/17 at 7:15AM. Thank you.  Contact information: [address] [place] [address] [phone]           Next level of care provider [facility] Link:no  Safety Planning and Suicide Prevention discussed: Yes,  with pt and his grandmother.  Have you used any form of tobacco in the last 30 days? (Cigarettes, Smokeless Tobacco, Cigars, and/or Pipes): Yes  Has patient been referred to the Quitline?: Patient refused referral  Patient has been referred for addiction treatment: N/A  Heidi DachKelsey Beuna Bolding, LCSW 06/04/2017, 10:15 AM

## 2017-06-04 NOTE — Progress Notes (Signed)
Patient denies SI/HI, denies A/V hallucinations. Patient verbalizes understanding of discharge instructions, follow up care and prescriptions. Patient given all belongings from  locker. Patient escorted out by staff, transported by family. 
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
## 2018-01-25 DIAGNOSIS — B009 Herpesviral infection, unspecified: Secondary | ICD-10-CM | POA: Insufficient documentation

## 2018-06-14 DIAGNOSIS — B009 Herpesviral infection, unspecified: Secondary | ICD-10-CM

## 2019-07-15 ENCOUNTER — Encounter: Payer: Self-pay | Admitting: Emergency Medicine

## 2019-07-15 ENCOUNTER — Emergency Department
Admission: EM | Admit: 2019-07-15 | Discharge: 2019-07-16 | Disposition: A | Discharge status: Other Acute Inpatient Hospital | Payer: Self-pay | Attending: Emergency Medicine | Admitting: Emergency Medicine

## 2019-07-15 ENCOUNTER — Other Ambulatory Visit: Payer: Self-pay

## 2019-07-15 DIAGNOSIS — F1120 Opioid dependence, uncomplicated: Secondary | ICD-10-CM | POA: Diagnosis present

## 2019-07-15 DIAGNOSIS — F1320 Sedative, hypnotic or anxiolytic dependence, uncomplicated: Secondary | ICD-10-CM | POA: Diagnosis present

## 2019-07-15 DIAGNOSIS — Z046 Encounter for general psychiatric examination, requested by authority: Secondary | ICD-10-CM | POA: Insufficient documentation

## 2019-07-15 DIAGNOSIS — Z79899 Other long term (current) drug therapy: Secondary | ICD-10-CM | POA: Insufficient documentation

## 2019-07-15 DIAGNOSIS — F17200 Nicotine dependence, unspecified, uncomplicated: Secondary | ICD-10-CM | POA: Diagnosis present

## 2019-07-15 DIAGNOSIS — B009 Herpesviral infection, unspecified: Secondary | ICD-10-CM | POA: Diagnosis present

## 2019-07-15 DIAGNOSIS — F22 Delusional disorders: Secondary | ICD-10-CM | POA: Insufficient documentation

## 2019-07-15 DIAGNOSIS — F17210 Nicotine dependence, cigarettes, uncomplicated: Secondary | ICD-10-CM | POA: Insufficient documentation

## 2019-07-15 DIAGNOSIS — F1690 Hallucinogen use, unspecified, uncomplicated: Secondary | ICD-10-CM | POA: Diagnosis present

## 2019-07-15 DIAGNOSIS — F332 Major depressive disorder, recurrent severe without psychotic features: Secondary | ICD-10-CM | POA: Diagnosis present

## 2019-07-15 DIAGNOSIS — F1020 Alcohol dependence, uncomplicated: Secondary | ICD-10-CM | POA: Diagnosis present

## 2019-07-15 DIAGNOSIS — Z20822 Contact with and (suspected) exposure to covid-19: Secondary | ICD-10-CM | POA: Insufficient documentation

## 2019-07-15 DIAGNOSIS — F1590 Other stimulant use, unspecified, uncomplicated: Secondary | ICD-10-CM | POA: Diagnosis present

## 2019-07-15 DIAGNOSIS — F1220 Cannabis dependence, uncomplicated: Secondary | ICD-10-CM

## 2019-07-15 HISTORY — DX: Other psychoactive substance abuse, uncomplicated: F19.10

## 2019-07-15 LAB — COMPREHENSIVE METABOLIC PANEL
ALT: 20 U/L (ref 0–44)
AST: 23 U/L (ref 15–41)
Albumin: 4.5 g/dL (ref 3.5–5.0)
Alkaline Phosphatase: 56 U/L (ref 38–126)
Anion gap: 10 (ref 5–15)
BUN: 13 mg/dL (ref 6–20)
CO2: 28 mmol/L (ref 22–32)
Calcium: 9.4 mg/dL (ref 8.9–10.3)
Chloride: 100 mmol/L (ref 98–111)
Creatinine, Ser: 0.91 mg/dL (ref 0.61–1.24)
GFR calc Af Amer: >60 mL/min (ref >60)
GFR calc non Af Amer: >60 mL/min (ref >60)
Glucose, Bld: 101 mg/dL — ABNORMAL HIGH (ref 70–99)
Potassium: 4.3 mmol/L (ref 3.5–5.1)
Sodium: 138 mmol/L (ref 135–145)
Total Bilirubin: 1 mg/dL (ref 0.3–1.2)
Total Protein: 7.3 g/dL (ref 6.5–8.1)

## 2019-07-15 LAB — CBC
HCT: 44.6 % (ref 39.0–52.0)
Hemoglobin: 15.3 g/dL (ref 13.0–17.0)
MCH: 30.5 pg (ref 26.0–34.0)
MCHC: 34.3 g/dL (ref 30.0–36.0)
MCV: 89 fL (ref 80.0–100.0)
Platelets: 421 10*3/uL — ABNORMAL HIGH (ref 150–400)
RBC: 5.01 MIL/uL (ref 4.22–5.81)
RDW: 13.3 % (ref 11.5–15.5)
WBC: 10.1 10*3/uL (ref 4.0–10.5)
nRBC: 0 % (ref 0.0–0.2)

## 2019-07-15 LAB — URINE DRUG SCREEN, QUALITATIVE (ARMC ONLY)
Amphetamines, Ur Screen: NOT DETECTED
Barbiturates, Ur Screen: NOT DETECTED
Benzodiazepine, Ur Scrn: NOT DETECTED
Cannabinoid 50 Ng, Ur ~~LOC~~: POSITIVE — AB
Cocaine Metabolite,Ur ~~LOC~~: NOT DETECTED
MDMA (Ecstasy)Ur Screen: NOT DETECTED
Methadone Scn, Ur: NOT DETECTED
Opiate, Ur Screen: NOT DETECTED
Phencyclidine (PCP) Ur S: NOT DETECTED
Tricyclic, Ur Screen: NOT DETECTED

## 2019-07-15 LAB — RESPIRATORY PANEL BY RT PCR (FLU A&B, COVID)
Influenza A by PCR: NEGATIVE
Influenza B by PCR: NEGATIVE
SARS Coronavirus 2 by RT PCR: NEGATIVE

## 2019-07-15 LAB — ETHANOL: Alcohol, Ethyl (B): <10 mg/dL (ref <10)

## 2019-07-15 LAB — TROPONIN I (HIGH SENSITIVITY): Troponin I (High Sensitivity): 4 ng/L (ref <18)

## 2019-07-15 LAB — SALICYLATE LEVEL: Salicylate Lvl: <7 mg/dL — ABNORMAL LOW (ref 7.0–30.0)

## 2019-07-15 LAB — ACETAMINOPHEN LEVEL: Acetaminophen (Tylenol), Serum: <10 ug/mL — ABNORMAL LOW (ref 10–30)

## 2019-07-15 NOTE — ED Notes (Signed)
Hourly rounding reveals patient awake in room. No complaints, stable, in no acute distress. Q15 minute rounds and monitoring via Rover and Officer to continue.  

## 2019-07-15 NOTE — ED Notes (Signed)
Hourly rounding reveals patient sleeping in room. No complaints, stable, in no acute distress. Q15 minute rounds and monitoring via Rover and Officer to continue.  

## 2019-07-15 NOTE — ED Provider Notes (Signed)
[facility] Emergency Department Provider Note  ____________________________________________   First MD Initiated Contact with Patient 07/15/19 1639     (approximate)  I have reviewed the triage vital signs and the nursing notes.   HISTORY  Chief Complaint Psychiatric Evaluation    HPI Roger [name] is a 29 y.o. male with substance abuse, depression who comes in with feeling like there is something inside of him.  Patient states he is unable to really explain it well but he just has his feeling that there is something stuck inside of them that is going to blow up.  He denies any SI, HI, auditory visual hallucinations.  He states that he has been admitted previously for psychiatric illnesses but states that he thinks has been due to drugs.  He denies any drug use recently.  This feeling is constant, severe, nothing makes it better, nothing makes it worse.          Past Medical History:  Diagnosis Date  . Depression   . Headache   . Substance abuse [facility])     Patient Active Problem List   Diagnosis Date Noted  . Herpes 01/25/2018  . Tobacco use disorder 11/25/2014  . Alcohol use disorder, severe, dependence ([place]) 11/25/2014  . Cannabis use disorder, severe, dependence ([place]) 11/25/2014  . 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 Opioid use disorder, moderate, dependence ([place]) 11/25/2014  . 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 Severe recurrent major depression without psychotic features ([place]) 11/24/2014    Past Surgical History:  Procedure Laterality Date  . FEMUR FRACTURE SURGERY      Prior to Admission medications   Medication Sig Start Date End Date Taking? Authorizing Provider  fluvoxaMINE (LUVOX) 100 MG tablet Take 1 tablet (100 mg total) by mouth at bedtime. 06/04/17   Pucilowska, Jolanta B, MD  OLANZapine (ZYPREXA) 10 MG tablet Take 1  tablet (10 mg total) by mouth at bedtime. 06/04/17   Pucilowska, Herma Ard B, MD  traZODone (DESYREL) 100 MG tablet Take 1 tablet (100 mg total) by mouth at bedtime. 06/04/17   Pucilowska, Wardell Honour, MD    Allergies Haldol [haloperidol lactate]  Family History  Problem Relation Age of Onset  . Alcohol abuse Father     Social History Social History   Tobacco Use  . Smoking status: Current Every Day Smoker    Packs/day: 0.50    Years: 10.00    Pack years: 5.00    Types: Cigarettes  . Smokeless tobacco: Former Systems developer    Quit date: 11/24/2014  Substance Use Topics  . Alcohol use: Yes    Alcohol/week: 10.0 standard drinks    Types: 3 Cans of beer, 7 Shots of liquor per week  . Drug use: Yes    Types: Amphetamines, Cocaine, Marijuana, Benzodiazepines, MDMA (Ecstacy), Hydrocodone      Review of Systems Constitutional: No fever/chills Eyes: No visual changes. ENT: No sore throat. Cardiovascular: Denies chest pain. Respiratory: Denies shortness of breath. Gastrointestinal: No abdominal pain.  No nausea, no vomiting.  No diarrhea.  No constipation. Genitourinary: Negative for dysuria. Musculoskeletal: Negative for back pain. Skin: Negative for rash. Neurological: Negative for headaches, focal weakness or numbness. Psych: Feeling like he is going to blow up, feeling oxalates inside of him.  Denies SI HI hallucinations All other ROS negative ____________________________________________   PHYSICAL EXAM:  VITAL SIGNS: ED Triage Vitals  Enc Vitals Group  BP 07/15/19 1622 (!) 153/82     Pulse Rate 07/15/19 1622 93     Resp 07/15/19 1622 16     Temp 07/15/19 1622 98.9 F (37.2 C)     Temp Source 07/15/19 1622 Oral     SpO2 07/15/19 1622 99 %     Weight 07/15/19 1622 125 lb (56.7 kg)     Height 07/15/19 1622 5\' 6"  (1.676 m)     Head Circumference --      Peak Flow --      Pain Score 07/15/19 1621 0     Pain Loc --      Pain Edu? --      Excl. in GC? --      Constitutional: Alert and oriented. Well appearing and in no acute distress. Eyes: Conjunctivae are normal. EOMI. Head: Atraumatic. Nose: No congestion/rhinnorhea. Mouth/Throat: Mucous membranes are moist.   Neck: No stridor. Trachea Midline. FROM Cardiovascular: Normal rate, regular rhythm. Grossly normal heart sounds.  Good peripheral circulation. Respiratory: Normal respiratory effort.  No retractions. Lungs CTAB. Gastrointestinal: Soft and nontender. No distention. No abdominal bruits.  Musculoskeletal: No lower extremity tenderness nor edema.  No joint effusions. Neurologic:  Normal speech and language. No gross focal neurologic deficits are appreciated.  Skin:  Skin is warm, dry and intact. No rash noted. Psychiatric: Denies SI HI auditory visual hallucinations although reports feeling something inside of himself GU: Deferred   ____________________________________________   LABS (all labs ordered are listed, but only abnormal results are displayed)  Labs Reviewed  COMPREHENSIVE METABOLIC PANEL - Abnormal; Notable for the following components:      Result Value   Glucose, Bld 101 (*)    All other components within normal limits  SALICYLATE LEVEL - Abnormal; Notable for the following components:   Salicylate Lvl <7.0 (*)    All other components within normal limits  ACETAMINOPHEN LEVEL - Abnormal; Notable for the following components:   Acetaminophen (Tylenol), Serum <10 (*)    All other components within normal limits  CBC - Abnormal; Notable for the following components:   Platelets 421 (*)    All other components within normal limits  URINE DRUG SCREEN, QUALITATIVE ([facility] ONLY) - Abnormal; Notable for the following components:   Cannabinoid 50 Ng, Ur [place] POSITIVE (*)    All other components within normal limits  ETHANOL  TROPONIN I (HIGH SENSITIVITY)   ____________________________________________   ED ECG REPORT I, 07/17/19, the attending physician,  personally viewed and interpreted this ECG.  EKG is sinus rate of 65, no ST elevation, no T wave inversions, normal intervals with sinus arrhythmia ____________________________________________   PROCEDURES  Procedure(s) performed (including Critical Care):  Procedures   ____________________________________________   INITIAL IMPRESSION / ASSESSMENT AND PLAN / ED COURSE  Roger [name] was evaluated in Emergency Department on 07/15/2019 for the symptoms described in the history of present illness. He was evaluated in the context of the global COVID-19 pandemic, which necessitated consideration that the patient might be at risk for infection with the SARS-CoV-2 virus that causes COVID-19. Institutional protocols and algorithms that pertain to the evaluation of patients at risk for COVID-19 are in a state of rapid change based on information released by regulatory bodies including the CDC and federal and state organizations. These policies and algorithms were followed during the patient's care in the ED.    Pt is without any acute medical complaints. No exam findings to suggest medical cause of current presentation.  Will order psychiatric screening labs and discuss further w/ psychiatric service.  D/d includes but is not limited to psychiatric disease, behavioral/personality disorder, inadequate socioeconomic support, medical.  Based on HPI, exam, unremarkable labs, no concern for acute medical problem at this time. No rigidity, clonus, hyperthermia, focal neurologic deficit, diaphoresis, tachycardia, meningismus, ataxia, gait abnormality or other finding to suggest this visit represents a non-psychiatric problem. Screening labs reviewed.    Given this, pt medically cleared, to be dispositioned per Psych.   At this time patient does not really meet IVC criteria but is willing to stay voluntary to see psychiatric team  The patient has been placed in psychiatric observation due to  the need to provide a safe environment for the patient while obtaining psychiatric consultation and evaluation, as well as ongoing medical and medication management to treat the patient's condition.  The patient has not been placed under full IVC at this time.      ____________________________________________   FINAL CLINICAL IMPRESSION(S) / ED DIAGNOSES   Final diagnoses:  Paranoia (HCC)      MEDICATIONS GIVEN DURING THIS VISIT:  Medications - No data to display   ED Discharge Orders    None       Note:  This document was prepared using Dragon voice recognition software and may include unintentional dictation errors.   Concha Se, MD 07/15/19 [id number]

## 2019-07-15 NOTE — ED Notes (Signed)
Report to include Situation, Background, Assessment, and Recommendations received from Amy Teague RN. Patient alert and oriented, warm and dry, in no acute distress. Patient denies SI, HI, AVH and pain. Patient made aware of Q15 minute rounds and Rover and Officer presence for their safety. Patient instructed to come to me with needs or concerns.  

## 2019-07-15 NOTE — ED Notes (Signed)
VOL/  PENDING  CONSULT 

## 2019-07-15 NOTE — Consult Note (Signed)
[facility] Consult   Reason for Consult: Psychiatric evaluation Referring Physician: Dr. Jari Pigg Patient Identification: Roger [name] MRN:  [id number] Principal Diagnosis: <principal problem not specified> Diagnosis:  Active Problems:   Severe recurrent major depression without psychotic features ([place])   Tobacco use disorder   Alcohol use disorder, severe, dependence ([place])   Cannabis use disorder, severe, dependence ([place])   Stimulant use disorder (cocaine-amphetamines)   Hallucinogen use (MDMA)   Opioid use disorder, moderate, dependence (HCC)   Sedative, hypnotic, or anxiolytic use disorder moderate   Herpes   Total Time spent with patient: 45 minutes  Subjective: "Lots of weird stuff is going on. Things just been getting worse."  Roger [name] is a 29 y.o. male patient presented to [facility] ED voluntarily due to him stating, "I cannot get a grip on how I am feeling."  The patient tried to explain by saying, "I feel like I am about to blow up, feel heavy like I cannot drive."  The patient continues to voice to the ED triage nurse by stating that he reports that he is unable to think over the last couple of days.  He declared, "I feel like something is moving around inside me."  During the patient assessment, he is sitting in the meditating position.  During his evaluation, he would close his eyes like he is meditating, open his eyes, answer the questions, and then return to the previous state.  The patient does admit to having a prior hospitalization but is adamantly about taking medications.  He states, "there is nothing wrong with me. I do not need medication to make it better." He voices, "there is nothing wrong with me mentally.  It is just something moving around in my body, and other people can see it."  The patient was seen face-to-face by this provider; chart reviewed and consulted with Dr. Jari Pigg on 07/15/2019 due to the patient's care. It was  discussed with the EDP that the patient does meet the criteria to be admitted to the psychiatric inpatient unit.  The patient is alert and oriented x 3, calm, paranoid but cooperative, and mood-congruent with affect on evaluation.  The patient does appear to be responding to internal and external stimuli. He is presenting with delusional thinking.  The patient voiced that people know he is different.  He states, "the way I have been treated specially by the girls tells me they know there is something different about me."  The patient continues to voice because people can tell that he is different. He has been stressed.  He states he does not go out anymore because he does not like how people look at him.  The patient expressed whenever he is driving, people pulled up next to his car, stop, and stares at him.  "Because they know there is something in me that is different."  The patient denies auditory or visual hallucinations. The patient denies suicidal, homicidal, or self-harm ideations. The patient is presenting with psychotic and paranoid behaviors. During an encounter with the patient, he was able to answer some questions appropriately.  Plan: The patient is a safety risk to self and does require psychiatric inpatient admission for stabilization and treatment.  HPI: Per Dr. Jari Pigg: Roger [name] is a 29 y.o. male with substance abuse, depression who comes in with feeling like there is something inside of him.  Patient states he is unable to really explain it well but he just has his feeling  that there is something stuck inside of them that is going to blow up.  He denies any SI, HI, auditory visual hallucinations.  He states that he has been admitted previously for psychiatric illnesses but states that he thinks has been due to drugs.  He denies any drug use recently.  This feeling is constant, severe, nothing makes it better, nothing makes it worse.  Past Psychiatric History:   Depression Headache Substance abuse (HCC)  Risk to Self: Suicidal Ideation: No Suicidal Intent: No Is patient at risk for suicide?: No Suicidal Plan?: No Access to Means: No What has been your use of drugs/alcohol within the last 12 months?: Use of marijuana How many times?: 0 Other Self Harm Risks: denied Triggers for Past Attempts: None known Intentional Self Injurious Behavior: None Risk to Others: Homicidal Ideation: No Thoughts of Harm to Others: No Current Homicidal Intent: No Current Homicidal Plan: No Access to Homicidal Means: No Identified Victim: None identified History of harm to others?: No Assessment of Violence: None Noted Does patient have access to weapons?: No Criminal Charges Pending?: No Does patient have a court date: No Prior Inpatient Therapy: Prior Inpatient Therapy: Yes Prior Therapy Dates: 2019 and prior Prior Therapy Facilty/Provider(s): [facility], [place] Reason for Treatment: Depression Prior Outpatient Therapy: Prior Outpatient Therapy: No Does patient have an ACCT team?: No Does patient have Intensive In-House Services?  : No Does patient have [facility] services? : No Does patient have P4CC services?: No  Past Medical History:  Past Medical History:  Diagnosis Date  . Depression   . Headache   . Substance abuse [facility])     Past Surgical History:  Procedure Laterality Date  . FEMUR FRACTURE SURGERY     Family History:  Family History  Problem Relation Age of Onset  . Alcohol abuse Father    Family Psychiatric  History:  Social History:  Social History   Substance and Sexual Activity  Alcohol Use Yes  . Alcohol/week: 10.0 standard drinks  . Types: 3 Cans of beer, 7 Shots of liquor per week     Social History   Substance and Sexual Activity  Drug Use Yes  . Types: Amphetamines, Cocaine, Marijuana, Benzodiazepines, MDMA (Ecstacy), Hydrocodone    Social History   Socioeconomic History  . Marital status: Single    Spouse name: Not on file   . Number of children: Not on file  . Years of education: Not on file  . Highest education level: Not on file  Occupational History  . Not on file  Tobacco Use  . Smoking status: Current Every Day Smoker    Packs/day: 0.50    Years: 10.00    Pack years: 5.00    Types: Cigarettes  . Smokeless tobacco: Former Neurosurgeon    Quit date: 11/24/2014  Substance and Sexual Activity  . Alcohol use: Yes    Alcohol/week: 10.0 standard drinks    Types: 3 Cans of beer, 7 Shots of liquor per week  . Drug use: Yes    Types: Amphetamines, Cocaine, Marijuana, Benzodiazepines, MDMA (Ecstacy), Hydrocodone  . Sexual activity: Yes    Birth control/protection: None  Other Topics Concern  . Not on file  Social History Narrative  . Not on file   Social Determinants of Health   Financial Resource Strain:   . Difficulty of Paying Living Expenses:   Food Insecurity:   . Worried About Programme researcher, broadcasting/film/video in the Last Year:   . [employer] of Food in the Last  Year:   Transportation Needs:   . Freight forwarderLack of Transportation (Medical):   Marland Kitchen. Lack of Transportation (Non-Medical):   Physical Activity:   . Days of Exercise per Week:   . Minutes of Exercise per Session:   Stress:   . Feeling of Stress :   Social Connections:   . Frequency of Communication with Friends and Family:   . Frequency of Social Gatherings with Friends and Family:   . Attends Religious Services:   . Active Member of Clubs or Organizations:   . Attends BankerClub or Organization Meetings:   Marland Kitchen. Marital Status:    Additional Social History:    Allergies:   Allergies  Allergen Reactions  . Haldol [Haloperidol Lactate]     EPS sxs    Labs:  Results for orders placed or performed during the hospital encounter of 07/15/19 (from the past 48 hour(s))  Comprehensive metabolic panel     Status: Abnormal   Collection Time: 07/15/19  4:34 PM  Result Value Ref Range   Sodium 138 135 - 145 mmol/L   Potassium 4.3 3.5 - 5.1 mmol/L   Chloride 100 98 - 111  mmol/L   CO2 28 22 - 32 mmol/L   Glucose, Bld 101 (H) 70 - 99 mg/dL    Comment: Glucose reference range applies only to samples taken after fasting for at least 8 hours.   BUN 13 6 - 20 mg/dL   Creatinine, Ser 1.610.91 0.61 - 1.24 mg/dL   Calcium 9.4 8.9 - 09.610.3 mg/dL   Total Protein 7.3 6.5 - 8.1 g/dL   Albumin 4.5 3.5 - 5.0 g/dL   AST 23 15 - 41 U/L   ALT 20 0 - 44 U/L   Alkaline Phosphatase 56 38 - 126 U/L   Total Bilirubin 1.0 0.3 - 1.2 mg/dL   GFR calc non Af Amer >60 >60 mL/min   GFR calc Af Amer >60 >60 mL/min   Anion gap 10 5 - 15    Comment: Performed at [facility], [address] [place], [place] [address]  Ethanol     Status: None   Collection Time: 07/15/19  4:34 PM  Result Value Ref Range   Alcohol, Ethyl (B) <10 <10 mg/dL    Comment: (NOTE) Lowest detectable limit for serum alcohol is 10 mg/dL. For medical purposes only. Performed at [facility], [address] [place], [place] [address]   Salicylate level     Status: Abnormal   Collection Time: 07/15/19  4:34 PM  Result Value Ref Range   Salicylate Lvl <7.0 (L) 7.0 - 30.0 mg/dL    Comment: Performed at [facility], [address] [place], [place] [address]  Acetaminophen level     Status: Abnormal   Collection Time: 07/15/19  4:34 PM  Result Value Ref Range   Acetaminophen (Tylenol), Serum <10 (L) 10 - 30 ug/mL    Comment: (NOTE) Therapeutic concentrations vary significantly. A range of 10-30 ug/mL  may be an effective concentration for many patients. However, some  are best treated at concentrations outside of this range. Acetaminophen concentrations >150 ug/mL at 4 hours after ingestion  and >50 ug/mL at 12 hours after ingestion are often associated with  toxic reactions. Performed at [facility], [address] [place], [place] [address]   cbc     Status: Abnormal   Collection Time: 07/15/19  4:34 PM  Result Value Ref Range   WBC 10.1 4.0 - 10.5 K/uL  Comment: WHITE COUNT CONFIRMED ON SMEAR   RBC 5.01 4.22 - 5.81 MIL/uL   Hemoglobin 15.3 13.0 - 17.0 g/dL   HCT 54.0 08.6 - 76.1 %   MCV 89.0 80.0 - 100.0 fL   MCH 30.5 26.0 - 34.0 pg   MCHC 34.3 30.0 - 36.0 g/dL   RDW 95.0 93.2 - 67.1 %   Platelets 421 (H) 150 - 400 K/uL   nRBC 0.0 0.0 - 0.2 %    Comment: Performed at [facility], [address] [place], [place] [address]  Urine Drug Screen, Qualitative     Status: Abnormal   Collection Time: 07/15/19  4:34 PM  Result Value Ref Range   Tricyclic, Ur Screen NONE DETECTED NONE DETECTED   Amphetamines, Ur Screen NONE DETECTED NONE DETECTED   MDMA (Ecstasy)Ur Screen NONE DETECTED NONE DETECTED   Cocaine Metabolite,Ur [place] NONE DETECTED NONE DETECTED   Opiate, Ur Screen NONE DETECTED NONE DETECTED   Phencyclidine (PCP) Ur S NONE DETECTED NONE DETECTED   Cannabinoid 50 Ng, Ur Combine POSITIVE (A) NONE DETECTED   Barbiturates, Ur Screen NONE DETECTED NONE DETECTED   Benzodiazepine, Ur Scrn NONE DETECTED NONE DETECTED   Methadone Scn, Ur NONE DETECTED NONE DETECTED    Comment: (NOTE) Tricyclics + metabolites, urine    Cutoff 1000 ng/mL Amphetamines + metabolites, urine  Cutoff 1000 ng/mL MDMA (Ecstasy), urine              Cutoff 500 ng/mL Cocaine Metabolite, urine          Cutoff 300 ng/mL Opiate + metabolites, urine        Cutoff 300 ng/mL Phencyclidine (PCP), urine         Cutoff 25 ng/mL Cannabinoid, urine                 Cutoff 50 ng/mL Barbiturates + metabolites, urine  Cutoff 200 ng/mL Benzodiazepine, urine              Cutoff 200 ng/mL Methadone, urine                   Cutoff 300 ng/mL The urine drug screen provides only a preliminary, unconfirmed analytical test result and should not be used for non-medical purposes. Clinical consideration and professional judgment should be applied to any positive drug screen result due to possible interfering substances. A more specific alternate chemical method must be used in order  to obtain a confirmed analytical result. Gas chromatography / mass spectrometry (GC/MS) is the preferred confirmat ory method. Performed at [facility], [address] [place], [place] [address]   Troponin I (High Sensitivity)     Status: None   Collection Time: 07/15/19  4:34 PM  Result Value Ref Range   Troponin I (High Sensitivity) 4 <18 ng/L    Comment: (NOTE) Elevated high sensitivity troponin I (hsTnI) values and significant  changes across serial measurements may suggest ACS but many other  chronic and acute conditions are known to elevate hsTnI results.  Refer to the "Links" section for chest pain algorithms and additional  guidance. Performed at [facility], [address] [place], [place] [address]   Respiratory Panel by RT PCR (Flu A&B, Covid) - Nasopharyngeal Swab     Status: None   Collection Time: 07/15/19  9:19 PM   Specimen: Nasopharyngeal Swab  Result Value Ref Range   SARS Coronavirus 2 by RT PCR NEGATIVE NEGATIVE    Comment: (NOTE) SARS-CoV-2 target nucleic acids  are NOT DETECTED. The SARS-CoV-2 RNA is generally detectable in upper respiratoy specimens during the acute phase of infection. The lowest concentration of SARS-CoV-2 viral copies this assay can detect is 131 copies/mL. A negative result does not preclude SARS-Cov-2 infection and should not be used as the sole basis for treatment or other patient management decisions. A negative result may occur with  improper specimen collection/handling, submission of specimen other than nasopharyngeal swab, presence of viral mutation(s) within the areas targeted by this assay, and inadequate number of viral copies (<131 copies/mL). A negative result must be combined with clinical observations, patient history, and epidemiological information. The expected result is Negative. Fact Sheet for Patients:  https://www.moore.com/ Fact Sheet for Healthcare Providers:   https://www.young.biz/ This test is not yet ap proved or cleared by the [place] FDA and  has been authorized for detection and/or diagnosis of SARS-CoV-2 by FDA under an Emergency Use Authorization (EUA). This EUA will remain  in effect (meaning this test can be used) for the duration of the COVID-19 declaration under Section 564(b)(1) of the Act, 21 U.S.C. section 360bbb-3(b)(1), unless the authorization is terminated or revoked sooner.    Influenza A by PCR NEGATIVE NEGATIVE   Influenza B by PCR NEGATIVE NEGATIVE    Comment: (NOTE) The Xpert Xpress SARS-CoV-2/FLU/RSV assay is intended as an aid in  the diagnosis of influenza from Nasopharyngeal swab specimens and  should not be used as a sole basis for treatment. Nasal washings and  aspirates are unacceptable for Xpert Xpress SARS-CoV-2/FLU/RSV  testing. Fact Sheet for Patients: https://www.moore.com/ Fact Sheet for Healthcare Providers: https://www.young.biz/ This test is not yet approved or cleared by the [place] FDA and  has been authorized for detection and/or diagnosis of SARS-CoV-2 by  FDA under an Emergency Use Authorization (EUA). This EUA will remain  in effect (meaning this test can be used) for the duration of the  Covid-19 declaration under Section 564(b)(1) of the Act, 21  U.S.C. section 360bbb-3(b)(1), unless the authorization is  terminated or revoked. Performed at [facility], [address] [place], [place] [address]     No current facility-administered medications for this encounter.   Current Outpatient Medications  Medication Sig Dispense Refill  . fluvoxaMINE (LUVOX) 100 MG tablet Take 1 tablet (100 mg total) by mouth at bedtime. 30 tablet 1  . OLANZapine (ZYPREXA) 10 MG tablet Take 1 tablet (10 mg total) by mouth at bedtime. 30 tablet 1  . traZODone (DESYREL) 100 MG tablet Take 1 tablet (100 mg total) by mouth at bedtime.  30 tablet 1    Musculoskeletal: Strength & Muscle Tone: within normal limits Gait & Station: normal Patient leans: N/A  Psychiatric Specialty Exam: Physical Exam  Nursing note and vitals reviewed. Constitutional: He is oriented to person, place, and time. He appears well-developed and well-nourished.  Cardiovascular: Normal rate.  Respiratory: Effort normal.  Musculoskeletal:        General: Normal range of motion.     Cervical back: Normal range of motion and neck supple.  Neurological: He is alert and oriented to person, place, and time.    Review of Systems  Psychiatric/Behavioral: The patient is nervous/anxious.   All other systems reviewed and are negative.   Blood pressure 134/71, pulse 79, temperature 98.5 F (36.9 C), temperature source Oral, resp. rate 15, height 5\' 6"  (1.676 m), weight 56.7 kg, SpO2 100 %.Body mass index is 20.18 kg/m.  General Appearance: Bizarre and Guarded  Eye Contact:  Good  Speech:  Clear and Coherent  Volume:  Normal  Mood:  Euphoric  Affect:  Congruent  Thought Process:  Coherent  Orientation:  Full (Time, Place, and Person)  Thought Content:  Illogical, Delusions, Paranoid Ideation and Rumination  Suicidal Thoughts:  No  Homicidal Thoughts:  No  Memory:  Immediate;   Good Recent;   Good Remote;   Good  Judgement:  Poor  Insight:  Lacking  Psychomotor Activity:  Increased  Concentration:  Concentration: Fair and Attention Span: Fair  Recall:  [employer] of Knowledge:  Poor  Language:  Good  Akathisia:  Negative  Handed:  Right  AIMS (if indicated):     Assets:  Communication Skills Desire for Improvement Housing Resilience Social Support  ADL's:  Intact  Cognition:  WNL  Sleep:    Good     Treatment Plan Summary: Medication management and Plan Patient meets criteria for psychiatric inpatient admission.  Disposition: Recommend psychiatric Inpatient admission when medically cleared. Supportive therapy provided about  ongoing stressors.  Gillermo Murdoch, NP 07/15/2019 10:45 PM

## 2019-07-15 NOTE — ED Notes (Signed)
Patient has been accepted to [facility].  Patient assigned to room 313 Accepting physician is Elenore Paddy, NP.  Call report to [phone].  Representative was Matt/Morene Cecilio   ER Staff is aware of it:  Carlene ER Secretary  Dr. Manson Passey, ER MD  Jillyn Hidden Patient's Nurse

## 2019-07-15 NOTE — ED Triage Notes (Addendum)
Asked pt what brought him to ED and states "I can't get a grip on how I am feeling".  When pt asked to explain states "I feel like I am about to blow up, feel heavy like I can't drive".  Over last couple days he reports he can't think, "feels like something is moving around inside me". No pain.  Is alert and oriented.  No fever.  No NVD. Denies any medical history except headaches.

## 2019-07-15 NOTE — ED Notes (Signed)
Hourly rounding reveals patient in room. No complaints, stable, in no acute distress. Q15 minute rounds and monitoring via Rover and Officer to continue.   

## 2019-07-15 NOTE — ED Notes (Addendum)
Dressed out by this Charity fundraiser and mel NT.  1 black tshirt 1 pair shoes 1 pair socks 1 phone 1 set head phones 1 pair pants 1 set keys 1 pair boxers

## 2019-07-16 ENCOUNTER — Encounter: Payer: Self-pay | Admitting: Behavioral Health

## 2019-07-16 ENCOUNTER — Inpatient Hospital Stay
Admission: EM | Admit: 2019-07-16 | Discharge: 2019-07-18 | DRG: 885 | Disposition: A | Discharge status: Home / Self Care | Payer: No Typology Code available for payment source | Source: Intra-hospital | Attending: Psychiatry | Admitting: Psychiatry

## 2019-07-16 DIAGNOSIS — Z9119 Patient's noncompliance with other medical treatment and regimen: Secondary | ICD-10-CM | POA: Diagnosis not present

## 2019-07-16 DIAGNOSIS — F17210 Nicotine dependence, cigarettes, uncomplicated: Secondary | ICD-10-CM | POA: Diagnosis present

## 2019-07-16 DIAGNOSIS — Z888 Allergy status to other drugs, medicaments and biological substances status: Secondary | ICD-10-CM | POA: Diagnosis not present

## 2019-07-16 DIAGNOSIS — F3164 Bipolar disorder, current episode mixed, severe, with psychotic features: Secondary | ICD-10-CM | POA: Diagnosis present

## 2019-07-16 DIAGNOSIS — Z20822 Contact with and (suspected) exposure to covid-19: Secondary | ICD-10-CM | POA: Diagnosis present

## 2019-07-16 DIAGNOSIS — Z818 Family history of other mental and behavioral disorders: Secondary | ICD-10-CM

## 2019-07-16 DIAGNOSIS — Z811 Family history of alcohol abuse and dependence: Secondary | ICD-10-CM | POA: Diagnosis not present

## 2019-07-16 DIAGNOSIS — G4700 Insomnia, unspecified: Secondary | ICD-10-CM | POA: Diagnosis present

## 2019-07-16 DIAGNOSIS — F1911 Other psychoactive substance abuse, in remission: Secondary | ICD-10-CM | POA: Diagnosis present

## 2019-07-16 DIAGNOSIS — Z79899 Other long term (current) drug therapy: Secondary | ICD-10-CM

## 2019-07-16 DIAGNOSIS — F419 Anxiety disorder, unspecified: Secondary | ICD-10-CM | POA: Diagnosis present

## 2019-07-16 DIAGNOSIS — F22 Delusional disorders: Secondary | ICD-10-CM | POA: Diagnosis present

## 2019-07-16 DIAGNOSIS — F29 Unspecified psychosis not due to a substance or known physiological condition: Secondary | ICD-10-CM | POA: Diagnosis present

## 2019-07-16 MED ORDER — MAGNESIUM HYDROXIDE 400 MG/5ML PO SUSP: 30.0000 mL | Freq: Every day | ORAL | Status: DC | PRN

## 2019-07-16 MED ORDER — TEMAZEPAM 15 MG PO CAPS
15.0000 mg | ORAL_CAPSULE | Freq: Every evening | ORAL | Status: DC | PRN
  Administered 2019-07-17: 21:00:00 15 mg via ORAL
  Filled 2019-07-16: qty 1

## 2019-07-16 MED ORDER — LORAZEPAM 2 MG PO TABS
2.0000 mg | ORAL_TABLET | Freq: Four times a day (QID) | ORAL | Status: DC | PRN
  Administered 2019-07-16: 2 mg via ORAL
  Filled 2019-07-16: qty 1

## 2019-07-16 MED ORDER — PALIPERIDONE ER 3 MG PO TB24
6.0000 mg | ORAL_TABLET | Freq: Every day | ORAL | Status: DC
  Administered 2019-07-16 – 2019-07-18 (×3): 6 mg via ORAL
  Filled 2019-07-16 (×3): qty 2

## 2019-07-16 MED ORDER — TRAZODONE HCL 100 MG PO TABS
100.0000 mg | ORAL_TABLET | Freq: Every day | ORAL | Status: DC
  Administered 2019-07-16: 100 mg via ORAL
  Filled 2019-07-16: qty 1

## 2019-07-16 MED ORDER — ALUM & MAG HYDROXIDE-SIMETH 200-200-20 MG/5ML PO SUSP: 30.0000 mL | ORAL | Status: DC | PRN

## 2019-07-16 MED ORDER — OLANZAPINE 10 MG PO TABS: 10.0000 mg | ORAL_TABLET | Freq: Every day | ORAL | Status: DC

## 2019-07-16 MED ORDER — FLUVOXAMINE MALEATE 50 MG PO TABS: 100.0000 mg | ORAL_TABLET | Freq: Every day | ORAL | Status: DC

## 2019-07-16 MED ORDER — ACETAMINOPHEN 325 MG PO TABS: 650.0000 mg | ORAL_TABLET | Freq: Four times a day (QID) | ORAL | Status: DC | PRN

## 2019-07-16 MED ORDER — OLANZAPINE 5 MG PO TABS: 15.0000 mg | ORAL_TABLET | Freq: Every day | ORAL | Status: DC

## 2019-07-16 NOTE — [facility] Suicide Risk Assessment (Deleted)
[facility] INPATIENT:  Family/Significant Other Suicide Prevention Education  Suicide Prevention Education:  Patient Refusal for Family/Significant Other Suicide Prevention Education: The patient Roger [name] has refused to provide written consent for family/significant other to be provided Family/Significant Other Suicide Prevention Education during admission and/or prior to discharge.  Physician notified.   SPE completed with pt, as pt refused to consent to family contact. SPI pamphlet provided to pt and pt was encouraged to share information with support network, ask questions, and talk about any concerns relating to SPE. Pt denies access to guns/firearms and verbalized understanding of information provided. Mobile Crisis information also provided to pt.    Charlann Lange Saleen Peden MSW LCSW 07/16/2019, 11:28 AM

## 2019-07-16 NOTE — Progress Notes (Signed)
Pt declined referrals for mental health treatment at this time.   Iris Pert, MSW, LCSW Clinical Social Work 07/16/2019 11:27 AM

## 2019-07-16 NOTE — Plan of Care (Signed)
Patient new to the unit  Problem: Education: Goal: Knowledge of [facility] Education information/materials will improve Outcome: Not Progressing Goal: Emotional status will improve Outcome: Not Progressing Goal: Mental status will improve Outcome: Not Progressing Goal: Verbalization of understanding the information provided will improve Outcome: Not Progressing   Problem: Safety: Goal: Periods of time without injury will increase Outcome: Not Progressing   Problem: Education: Goal: Will be free of psychotic symptoms Outcome: Not Progressing Goal: Knowledge of the prescribed therapeutic regimen will improve Outcome: Not Progressing

## 2019-07-16 NOTE — H&P (Signed)
Psychiatric Admission Assessment Adult  Patient Identification: Roger [name] MRN:  [id number] Date of Evaluation:  07/16/2019 Chief Complaint:  Paranoia (HCC) [F22] Principal Diagnosis: Bipolar affective disorder, mixed, severe, with psychotic behavior (HCC) Diagnosis:  Principal Problem:   Bipolar affective disorder, mixed, severe, with psychotic behavior (HCC) Active Problems:   Paranoia (HCC)  History of Present Illness: Patient seen chart reviewed.  This is a 29 year old man with a past history of admissions for psychosis and mood symptoms who came to the emergency room with a combination of psychotic symptoms and mood instability.  Patient tells me that starting a little over a month ago when his sister moved from [place] down to [place] he started having visual hallucinations.  He describes a strange experience of seeing an alternate version of her standing behind her.  Since then these have become more frequent and consistent.  He is also hearing things at times.  His mood has been anxious and confused and he has not been able to work effectively.  His sleep is erratic and he is frequently going days without sleep.  Patient talks about having the belief that things in music that he hears or the looks of other people around him on the street are communicating about his own condition.  He denies having any thoughts of hurting himself or hurting anyone else.  He denies that he has been using drugs regularly saying in fact that this weird feeling is such that the drugs have no effect so he is not even using marijuana anymore.  Patient is of course not getting any outpatient follow-up.  He was last here in the hospital 2 years ago and never followed up with outpatient treatment at that time.  I spoke with his grandmother who confirms this and also confirms that his recent behavior has been problematic although she does not cite anything particularly violent or  threatening. Associated Signs/Symptoms: Depression Symptoms:  depressed mood, insomnia, difficulty concentrating, hopelessness, anxiety, disturbed sleep, (Hypo) Manic Symptoms:  Distractibility, Hallucinations, Impulsivity, Anxiety Symptoms:  Excessive Worry, Psychotic Symptoms:  Hallucinations: Visual Ideas of Reference, Paranoia, PTSD Symptoms: Negative Total Time spent with patient: 1 hour  Past Psychiatric History: Patient has had several prior hospitalizations the most recent one we know of was 2 years ago.  That diagnosis was major depression with psychotic features.  It looks like all of his admissions have been with dysphoric mood and psychotic symptoms.  In the past a lot of this was mixed with substance abuse which does not seem to be particularly active at this time.  Patient has never been compliant with outpatient treatment  Is the patient at risk to self? Yes.    Has the patient been a risk to self in the past 6 months? Yes.    Has the patient been a risk to self within the distant past? Yes.    Is the patient a risk to others? No.  Has the patient been a risk to others in the past 6 months? No.  Has the patient been a risk to others within the distant past? No.   Prior Inpatient Therapy:   Prior Outpatient Therapy:    Alcohol Screening: 1. How often do you have a drink containing alcohol?: Never 2. How many drinks containing alcohol do you have on a typical day when you are drinking?: 1 or 2 3. How often do you have six or more drinks on one occasion?: Never AUDIT-C Score: 0 4. How often during  the last year have you found that you were not able to stop drinking once you had started?: Never 5. How often during the last year have you failed to do what was normally expected from you becasue of drinking?: Never 6. How often during the last year have you needed a first drink in the morning to get yourself going after a heavy drinking session?: Never 7. How often  during the last year have you had a feeling of guilt of remorse after drinking?: Never 8. How often during the last year have you been unable to remember what happened the night before because you had been drinking?: Never 9. Have you or someone else been injured as a result of your drinking?: No 10. Has a relative or friend or a doctor or another health worker been concerned about your drinking or suggested you cut down?: No Alcohol Use Disorder Identification Test Final Score (AUDIT): 0 Alcohol Brief Interventions/Follow-up: AUDIT Score <7 follow-up not indicated Substance Abuse History in the last 12 months:  Yes.   Consequences of Substance Abuse: Negative Previous Psychotropic Medications: Yes  Psychological Evaluations: Yes  Past Medical History:  Past Medical History:  Diagnosis Date  . Depression   . Headache   . Substance abuse [facility])     Past Surgical History:  Procedure Laterality Date  . FEMUR FRACTURE SURGERY     Family History:  Family History  Problem Relation Age of Onset  . Alcohol abuse Father    Family Psychiatric  History: Patient's mother and at least 1 sibling I believe a sister are both noted to have bipolar disorder Tobacco Screening: Have you used any form of tobacco in the last 30 days? (Cigarettes, Smokeless Tobacco, Cigars, and/or Pipes): Yes Tobacco use, Select all that apply: 5 or more cigarettes per day Are you interested in Tobacco Cessation Medications?: No, patient refused Counseled patient on smoking cessation including recognizing danger situations, developing coping skills and basic information about quitting provided: Refused/Declined practical counseling Social History:  Social History   Substance and Sexual Activity  Alcohol Use Yes  . Alcohol/week: 10.0 standard drinks  . Types: 3 Cans of beer, 7 Shots of liquor per week     Social History   Substance and Sexual Activity  Drug Use Yes  . Types: Amphetamines, Cocaine, Marijuana,  Benzodiazepines, MDMA (Ecstacy), Hydrocodone    Additional Social History:                           Allergies:   Allergies  Allergen Reactions  . Haldol [Haloperidol Lactate]     EPS sxs   Lab Results:  Results for orders placed or performed during the hospital encounter of 07/15/19 (from the past 48 hour(s))  Comprehensive metabolic panel     Status: Abnormal   Collection Time: 07/15/19  4:34 PM  Result Value Ref Range   Sodium 138 135 - 145 mmol/L   Potassium 4.3 3.5 - 5.1 mmol/L   Chloride 100 98 - 111 mmol/L   CO2 28 22 - 32 mmol/L   Glucose, Bld 101 (H) 70 - 99 mg/dL    Comment: Glucose reference range applies only to samples taken after fasting for at least 8 hours.   BUN 13 6 - 20 mg/dL   Creatinine, Ser 0.91 0.61 - 1.24 mg/dL   Calcium 9.4 8.9 - 10.3 mg/dL   Total Protein 7.3 6.5 - 8.1 g/dL   Albumin 4.5 3.5 - 5.0  g/dL   AST 23 15 - 41 U/L   ALT 20 0 - 44 U/L   Alkaline Phosphatase 56 38 - 126 U/L   Total Bilirubin 1.0 0.3 - 1.2 mg/dL   GFR calc non Af Amer >60 >60 mL/min   GFR calc Af Amer >60 >60 mL/min   Anion gap 10 5 - 15    Comment: Performed at [facility], [address] [place], [place] [address]  Ethanol     Status: None   Collection Time: 07/15/19  4:34 PM  Result Value Ref Range   Alcohol, Ethyl (B) <10 <10 mg/dL    Comment: (NOTE) Lowest detectable limit for serum alcohol is 10 mg/dL. For medical purposes only. Performed at [facility], [address] [place], [place] [address]   Salicylate level     Status: Abnormal   Collection Time: 07/15/19  4:34 PM  Result Value Ref Range   Salicylate Lvl <7.0 (L) 7.0 - 30.0 mg/dL    Comment: Performed at [facility], [address] [place], [place] [address]  Acetaminophen level     Status: Abnormal   Collection Time: 07/15/19  4:34 PM  Result Value Ref Range   Acetaminophen (Tylenol), Serum <10 (L) 10 - 30 ug/mL    Comment: (NOTE) Therapeutic  concentrations vary significantly. A range of 10-30 ug/mL  may be an effective concentration for many patients. However, some  are best treated at concentrations outside of this range. Acetaminophen concentrations >150 ug/mL at 4 hours after ingestion  and >50 ug/mL at 12 hours after ingestion are often associated with  toxic reactions. Performed at [facility], [address] [place], [place] [address]   cbc     Status: Abnormal   Collection Time: 07/15/19  4:34 PM  Result Value Ref Range   WBC 10.1 4.0 - 10.5 K/uL    Comment: WHITE COUNT CONFIRMED ON SMEAR   RBC 5.01 4.22 - 5.81 MIL/uL   Hemoglobin 15.3 13.0 - 17.0 g/dL   HCT 65.744.6 84.639.0 - 96.252.0 %   MCV 89.0 80.0 - 100.0 fL   MCH 30.5 26.0 - 34.0 pg   MCHC 34.3 30.0 - 36.0 g/dL   RDW 95.213.3 84.111.5 - 32.415.5 %   Platelets 421 (H) 150 - 400 K/uL   nRBC 0.0 0.0 - 0.2 %    Comment: Performed at [facility], [address] [place], [place] [address]  Urine Drug Screen, Qualitative     Status: Abnormal   Collection Time: 07/15/19  4:34 PM  Result Value Ref Range   Tricyclic, Ur Screen NONE DETECTED NONE DETECTED   Amphetamines, Ur Screen NONE DETECTED NONE DETECTED   MDMA (Ecstasy)Ur Screen NONE DETECTED NONE DETECTED   Cocaine Metabolite,Ur [place] NONE DETECTED NONE DETECTED   Opiate, Ur Screen NONE DETECTED NONE DETECTED   Phencyclidine (PCP) Ur S NONE DETECTED NONE DETECTED   Cannabinoid 50 Ng, Ur  POSITIVE (A) NONE DETECTED   Barbiturates, Ur Screen NONE DETECTED NONE DETECTED   Benzodiazepine, Ur Scrn NONE DETECTED NONE DETECTED   Methadone Scn, Ur NONE DETECTED NONE DETECTED    Comment: (NOTE) Tricyclics + metabolites, urine    Cutoff 1000 ng/mL Amphetamines + metabolites, urine  Cutoff 1000 ng/mL MDMA (Ecstasy), urine              Cutoff 500 ng/mL Cocaine Metabolite, urine          Cutoff 300 ng/mL Opiate + metabolites, urine  Cutoff 300 ng/mL Phencyclidine (PCP), urine         Cutoff 25  ng/mL Cannabinoid, urine                 Cutoff 50 ng/mL Barbiturates + metabolites, urine  Cutoff 200 ng/mL Benzodiazepine, urine              Cutoff 200 ng/mL Methadone, urine                   Cutoff 300 ng/mL The urine drug screen provides only a preliminary, unconfirmed analytical test result and should not be used for non-medical purposes. Clinical consideration and professional judgment should be applied to any positive drug screen result due to possible interfering substances. A more specific alternate chemical method must be used in order to obtain a confirmed analytical result. Gas chromatography / mass spectrometry (GC/MS) is the preferred confirmat ory method. Performed at [facility], [address] [place], [place] [address]   Troponin I (High Sensitivity)     Status: None   Collection Time: 07/15/19  4:34 PM  Result Value Ref Range   Troponin I (High Sensitivity) 4 <18 ng/L    Comment: (NOTE) Elevated high sensitivity troponin I (hsTnI) values and significant  changes across serial measurements may suggest ACS but many other  chronic and acute conditions are known to elevate hsTnI results.  Refer to the "Links" section for chest pain algorithms and additional  guidance. Performed at [facility], [address] [place], [place] [address]   Respiratory Panel by RT PCR (Flu A&B, Covid) - Nasopharyngeal Swab     Status: None   Collection Time: 07/15/19  9:19 PM   Specimen: Nasopharyngeal Swab  Result Value Ref Range   SARS Coronavirus 2 by RT PCR NEGATIVE NEGATIVE    Comment: (NOTE) SARS-CoV-2 target nucleic acids are NOT DETECTED. The SARS-CoV-2 RNA is generally detectable in upper respiratoy specimens during the acute phase of infection. The lowest concentration of SARS-CoV-2 viral copies this assay can detect is 131 copies/mL. A negative result does not preclude SARS-Cov-2 infection and should not be used as the sole basis for treatment  or other patient management decisions. A negative result may occur with  improper specimen collection/handling, submission of specimen other than nasopharyngeal swab, presence of viral mutation(s) within the areas targeted by this assay, and inadequate number of viral copies (<131 copies/mL). A negative result must be combined with clinical observations, patient history, and epidemiological information. The expected result is Negative. Fact Sheet for Patients:  https://www.moore.com/ Fact Sheet for Healthcare Providers:  https://www.young.biz/ This test is not yet ap proved or cleared by the [place] FDA and  has been authorized for detection and/or diagnosis of SARS-CoV-2 by FDA under an Emergency Use Authorization (EUA). This EUA will remain  in effect (meaning this test can be used) for the duration of the COVID-19 declaration under Section 564(b)(1) of the Act, 21 U.S.C. section 360bbb-3(b)(1), unless the authorization is terminated or revoked sooner.    Influenza A by PCR NEGATIVE NEGATIVE   Influenza B by PCR NEGATIVE NEGATIVE    Comment: (NOTE) The Xpert Xpress SARS-CoV-2/FLU/RSV assay is intended as an aid in  the diagnosis of influenza from Nasopharyngeal swab specimens and  should not be used as a sole basis for treatment. Nasal washings and  aspirates are unacceptable for Xpert Xpress SARS-CoV-2/FLU/RSV  testing. Fact Sheet for Patients: https://www.moore.com/ Fact Sheet for Healthcare Providers: https://www.young.biz/ This test is not yet  approved or cleared by the [place] and  has been authorized for detection and/or diagnosis of SARS-CoV-2 by  FDA under an Emergency Use Authorization (EUA). This EUA will remain  in effect (meaning this test can be used) for the duration of the  Covid-19 declaration under Section 564(b)(1) of the Act, 21  U.S.C. section 360bbb-3(b)(1), unless  the authorization is  terminated or revoked. Performed at [facility], [address] [place], [place] [address]     Blood Alcohol level:  Lab Results  Component Value Date   [facility] <10 07/15/2019   ETH <10 05/30/2017    Metabolic Disorder Labs:  Lab Results  Component Value Date   HGBA1C 5.7 (H) 06/01/2017   MPG 116.89 06/01/2017   Lab Results  Component Value Date   PROLACTIN 25.5 (H) 12/25/2015   Lab Results  Component Value Date   CHOL 139 06/01/2017   TRIG 46 06/01/2017   HDL 38 (L) 06/01/2017   CHOLHDL 3.7 06/01/2017   VLDL 9 06/01/2017   LDLCALC 92 06/01/2017   LDLCALC 80 12/25/2015    Current Medications: Current Facility-Administered Medications  Medication Dose Route Frequency Provider Last Rate Last Admin  . acetaminophen (TYLENOL) tablet 650 mg  650 mg Oral Q6H PRN Gillermo Murdoch, NP      . alum & mag hydroxide-simeth (MAALOX/MYLANTA) 200-200-20 MG/5ML suspension 30 mL  30 mL Oral Q4H PRN Gillermo Murdoch, NP      . LORazepam (ATIVAN) tablet 2 mg  2 mg Oral Q6H PRN Margeart Allender T, MD      . magnesium hydroxide (MILK OF MAGNESIA) suspension 30 mL  30 mL Oral Daily PRN Gillermo Murdoch, NP      . paliperidone (INVEGA) 24 hr tablet 6 mg  6 mg Oral Daily Mykal Kirchman T, MD      . temazepam (RESTORIL) capsule 15 mg  15 mg Oral QHS PRN Jkayla Spiewak, Jackquline Denmark, MD       PTA Medications: Medications Prior to Admission  Medication Sig Dispense Refill Last Dose  . fluvoxaMINE (LUVOX) 100 MG tablet Take 1 tablet (100 mg total) by mouth at bedtime. 30 tablet 1   . OLANZapine (ZYPREXA) 10 MG tablet Take 1 tablet (10 mg total) by mouth at bedtime. 30 tablet 1   . traZODone (DESYREL) 100 MG tablet Take 1 tablet (100 mg total) by mouth at bedtime. 30 tablet 1     Musculoskeletal: Strength & Muscle Tone: within normal limits Gait & Station: normal Patient leans: N/A  Psychiatric Specialty Exam: Physical Exam  Nursing note and vitals  reviewed. Constitutional: He appears well-developed and well-nourished.  HENT:  Head: Normocephalic and atraumatic.  Eyes: Pupils are equal, round, and reactive to light. Conjunctivae are normal.  Cardiovascular: Regular rhythm and normal heart sounds.  Respiratory: Effort normal. No respiratory distress.  GI: Soft.  Musculoskeletal:        General: Normal range of motion.     Cervical back: Normal range of motion.  Neurological: He is alert.  Skin: Skin is warm and dry.  Psychiatric: His mood appears anxious. His affect is labile. His speech is rapid and/or pressured and tangential. He is agitated. He is not aggressive. Thought content is paranoid and delusional. Cognition and memory are impaired. He expresses impulsivity. He exhibits a depressed mood. He expresses no homicidal and no suicidal ideation.    Review of Systems  Constitutional: Negative.   HENT: Negative.   Eyes: Negative.   Respiratory: Negative.  Cardiovascular: Negative.   Gastrointestinal: Negative.   Musculoskeletal: Negative.   Skin: Negative.   Neurological: Negative.   Psychiatric/Behavioral: Positive for agitation, dysphoric mood and sleep disturbance.    Blood pressure (!) 124/92, pulse 67, temperature 98.1 F (36.7 C), temperature source Oral, resp. rate 16, height 5\' 6"  (1.676 m), weight 56.7 kg, SpO2 100 %.Body mass index is 20.18 kg/m.  General Appearance: Casual  Eye Contact:  Fair  Speech:  Pressured  Volume:  Increased  Mood:  Anxious and Dysphoric  Affect:  Constricted and Depressed  Thought Process:  Disorganized  Orientation:  Full (Time, Place, and Person)  Thought Content:  Illogical, Delusions, Hallucinations: Visual, Ideas of Reference:   Paranoia Delusions and Paranoid Ideation  Suicidal Thoughts:  No  Homicidal Thoughts:  No  Memory:  Immediate;   Fair Recent;   Fair Remote;   Fair  Judgement:  Impaired  Insight:  Shallow  Psychomotor Activity:  Restlessness  Concentration:   Concentration: Fair  Recall:  of Knowledge:  Fair  Language:  Fair  Akathisia:  No  Handed:  Right  AIMS (if indicated):     Assets:  Desire for Improvement Housing Physical Health Resilience  ADL's:  Impaired  Cognition:  Impaired,  Mild  Sleep:  Number of Hours: 3.75    Treatment Plan Summary: Daily contact with patient to assess and evaluate symptoms and progress in treatment, Medication management and Plan 29 year old man who appears to almost certainly have bipolar disorder.  His current episode seems like a mixed psychotic episode.  I educated him about the dangers of this illness and the potential for worsening outcomes including dangerous behavior.  Tried to engage him in conversation about medication.  At that point his insight really failed and he became fixated on wanting to be discharged right away.  I told him I did not think that was the right thing to do and would not be discharging him today.  After speaking with his grandmother who makes the very good point that he is not going to take oral medicine of his own volition I will change his order to [facility] with the hope that we can switch over to the shot.  Engage in individual and group therapy.  Ongoing regular assessment of mood and behavior especially prior to discharge.  Observation Level/Precautions:  15 minute checks  Laboratory:  UDS  Psychotherapy:    Medications:    Consultations:    Discharge Concerns:    Estimated LOS:  Other:     Physician Treatment Plan for Primary Diagnosis: Bipolar affective disorder, mixed, severe, with psychotic behavior (HCC) Long Term Goal(s): Improvement in symptoms so as ready for discharge  Short Term Goals: Ability to verbalize feelings will improve, Ability to demonstrate self-control will improve and Ability to identify and develop effective coping behaviors will improve  Physician Treatment Plan for Secondary Diagnosis: Principal Problem:   Bipolar affective  disorder, mixed, severe, with psychotic behavior (HCC) Active Problems:   Paranoia (HCC)  Long Term Goal(s): Improvement in symptoms so as ready for discharge  Short Term Goals: Ability to maintain clinical measurements within normal limits will improve and Compliance with prescribed medications will improve  I certify that inpatient services furnished can reasonably be expected to improve the patient's condition.    [facility], MD 3/31/20212:29 PM

## 2019-07-16 NOTE — [facility] Group Notes (Signed)
Balance In Life 07/16/2019 1PM  Type of Therapy/Topic:  Group Therapy:  Balance in Life  Participation Level:  Did Not Attend  Description of Group:   This group will address the concept of balance and how it feels and looks when one is unbalanced. Patients will be encouraged to process areas in their lives that are out of balance and identify reasons for remaining unbalanced. Facilitators will guide patients in utilizing problem-solving interventions to address and correct the stressor making their life unbalanced. Understanding and applying boundaries will be explored and addressed for obtaining and maintaining a balanced life. Patients will be encouraged to explore ways to assertively make their unbalanced needs known to significant others in their lives, using other group members and facilitator for support and feedback.  Therapeutic Goals: 1. Patient will identify two or more emotions or situations they have that consume much of in their lives. 2. Patient will identify signs/triggers that life has become out of balance:  3. Patient will identify two ways to set boundaries in order to achieve balance in their lives:  4. Patient will demonstrate ability to communicate their needs through discussion and/or role plays  Summary of Patient Progress:    Therapeutic Modalities:   Cognitive Behavioral Therapy Solution-Focused Therapy Assertiveness Training  Kandra Graven T Zayn Selley, LCSW  

## 2019-07-16 NOTE — [facility] Group Notes (Signed)
[facility] Group Notes:  (Nursing/MHT/Case Management/Adjunct)  Date:  07/16/2019  Time:  8:49 PM  Type of Therapy:  Wrap-Up Group  Participation Level:  Did Not Attend  Participation Quality:    Affect:    Cognitive:    Insight:    Engagement in Group:    Modes of Intervention:    Summary of Progress/Problems:  Ova Meegan 07/16/2019, 8:49 PM

## 2019-07-16 NOTE — [facility] Suicide Risk Assessment (Signed)
[facility] INPATIENT:  Family/Significant Other Suicide Prevention Education  Suicide Prevention Education:  Education Completed; Ronna Polio, grandmother [phone] has been identified by the patient as the family member/significant other with whom the patient will be residing, and identified as the person(s) who will aid the patient in the event of a mental health crisis (suicidal ideations/suicide attempt).  With written consent from the patient, the family member/significant other has been provided the following suicide prevention education, prior to the and/or following the discharge of the patient.  The suicide prevention education provided includes the following:  Suicide risk factors  Suicide prevention and interventions  National Suicide Hotline telephone number  [facility] assessment telephone number  [facility] Emergency Assistance 911  [facility] and/or Residential Mobile Crisis Unit telephone number  Request made of family/significant other to:  Remove weapons (e.g., guns, rifles, knives), all items previously/currently identified as safety concern.    Remove drugs/medications (over-the-counter, prescriptions, illicit drugs), all items previously/currently identified as a safety concern.  The family member/significant other verbalizes understanding of the suicide prevention education information provided.  The family member/significant other agrees to remove the items of safety concern listed above.  CSW spoke with pts grandmother who reported pt was having problems saying something was going through his body like a snake. She denies SI/HI concerns or concerns with pt coming home at discharge when stable. She reported there are guns in the home, but pt is unaware of there being guns in the home. Grandmother requested to speak with attending physician regarding medications and pts condition.  Charlann Lange Bettina Warn MSW LCSW 07/16/2019, 12:33 PM

## 2019-07-16 NOTE — Progress Notes (Signed)
Admission Note:  29 yr male who presents IVC in no acute distress for the treatment of Psychosis and Depression, patient appears flat and sad, he was restlessness and he stated " I believe I am possessed something  Is in me"  Patient  was calm and cooperative with admission process, he denies SI/HI/AVH and contracts for safety upon admission. Patient acknowledges he uses marijuana regularly. Patient was noted responding to internal stimuli he stated " everywhere l go people can read my mind and tell what going on in my mind"  Patient appears paranoid and hyper vigilant.  Patient has Past medical Hx of Depression and Substance Abuse. Patient's skin was assessed and found to be clear of any abnormal marks he was also searched and no contraband found, POC and unit policies explained, understanding verbalized and consents obtained. Food was offered and patient s accepted. No distress noted, 15 minutes safety checks maintained will continue to monitor.

## 2019-07-16 NOTE — Progress Notes (Signed)
Pt was upset after his discussion with Dr.Clapacs. He wants to be discharged. He said "I'm going to show out if I'm not discharged." He was not ever physically violent. Pt was given emotional support by staff. Pt continues ro respond to internal stimuli and states "I don't think I'm possessed I know". Torrie Mayers RN

## 2019-07-16 NOTE — Plan of Care (Signed)
PT denies depression, SI, HI and AVH. Pt rates anxiety 10/10. Pt was educated on care plan and verbalizes understanding. Torrie Mayers RN Problem: Education: Goal: Knowledge of [facility] Education information/materials will improve Outcome: Progressing Goal: Emotional status will improve Outcome: Not Progressing Goal: Mental status will improve Outcome: Not Progressing Goal: Verbalization of understanding the information provided will improve Outcome: Progressing   Problem: Safety: Goal: Periods of time without injury will increase Outcome: Progressing   Problem: Education: Goal: Will be free of psychotic symptoms Outcome: Not Progressing Goal: Knowledge of the prescribed therapeutic regimen will improve Outcome: Progressing   Problem: Safety: Goal: Ability to redirect hostility and anger into socially appropriate behaviors will improve Outcome: Progressing Goal: Ability to remain free from injury will improve Outcome: Progressing

## 2019-07-16 NOTE — BHH Suicide Risk Assessment (Signed)
[facility] Admission Suicide Risk Assessment   Nursing information obtained from:  Patient Demographic factors:  Male Current Mental Status:  NA Loss Factors:  NA Historical Factors:  NA Risk Reduction Factors:  NA  Total Time spent with patient: 1 hour Principal Problem: Bipolar affective disorder, mixed, severe, with psychotic behavior (HCC) Diagnosis:  Principal Problem:   Bipolar affective disorder, mixed, severe, with psychotic behavior (HCC) Active Problems:   Paranoia (HCC)  Subjective Data: 29 year old man with a history of recurrent episodes of mental health problems came to the hospital with paranoia ideas of reference and unstable mood.  Denies suicidal or homicidal ideation.  [employer].  Only partially cooperative but not acting out.  Continued Clinical Symptoms:  Alcohol Use Disorder Identification Test Final Score (AUDIT): 0 The "Alcohol Use Disorders Identification Test", Guidelines for Use in Primary Care, Second Edition.  World Science writer [facility]). Score between 0-7:  no or low risk or alcohol related problems. Score between 8-15:  moderate risk of alcohol related problems. Score between 16-19:  high risk of alcohol related problems. Score 20 or above:  warrants further diagnostic evaluation for alcohol dependence and treatment.   CLINICAL FACTORS:   Bipolar Disorder:   Mixed State   Musculoskeletal: Strength & Muscle Tone: within normal limits Gait & Station: normal Patient leans: N/A  Psychiatric Specialty Exam: Physical Exam  Nursing note and vitals reviewed. Constitutional: He appears well-developed and well-nourished.  HENT:  Head: Normocephalic and atraumatic.  Eyes: Pupils are equal, round, and reactive to light. Conjunctivae are normal.  Cardiovascular: Regular rhythm and normal heart sounds.  Respiratory: Effort normal. No respiratory distress.  GI: Soft.  Musculoskeletal:        General: Normal range of motion.     Cervical back: Normal  range of motion.  Neurological: He is alert.  Skin: Skin is warm and dry.  Psychiatric: His mood appears anxious. His speech is tangential. He is agitated. He is not aggressive. Thought content is paranoid and delusional. Cognition and memory are impaired. He expresses impulsivity and inappropriate judgment. He exhibits a depressed mood. He expresses no homicidal ideation.    Review of Systems  Constitutional: Negative.   HENT: Negative.   Eyes: Negative.   Respiratory: Negative.   Cardiovascular: Negative.   Gastrointestinal: Negative.   Musculoskeletal: Negative.   Skin: Negative.   Neurological: Negative.   Psychiatric/Behavioral: Positive for agitation, behavioral problems, dysphoric mood, hallucinations and sleep disturbance. The patient is nervous/anxious and is hyperactive.     Blood pressure (!) 124/92, pulse 67, temperature 98.1 F (36.7 C), temperature source Oral, resp. rate 16, height 5\' 6"  (1.676 m), weight 56.7 kg, SpO2 100 %.Body mass index is 20.18 kg/m.  General Appearance: Casual  Eye Contact:  Fair  Speech:  Pressured  Volume:  Increased  Mood:  Anxious and Dysphoric  Affect:  Constricted  Thought Process:  Disorganized  Orientation:  Full (Time, Place, and Person)  Thought Content:  Illogical, Delusions, Hallucinations: Auditory Visual and Ideas of Reference:   Paranoia Delusions  Suicidal Thoughts:  No  Homicidal Thoughts:  No  Memory:  Immediate;   Fair Recent;   Fair Remote;   Fair  Judgement:  Impaired  Insight:  Shallow  Psychomotor Activity:  Normal  Concentration:  Concentration: Fair  Recall:  of Knowledge:  Fair  Language:  Fair  Akathisia:  No  Handed:  Right  AIMS (if indicated):     Assets:  Desire for Improvement Housing Physical  Health Resilience Social Support  ADL's:  Impaired  Cognition:  Impaired,  Mild  Sleep:  Number of Hours: 3.75      COGNITIVE FEATURES THAT CONTRIBUTE TO RISK:  Thought constriction  (tunnel vision)    SUICIDE RISK:   Minimal: No identifiable suicidal ideation.  Patients presenting with no risk factors but with morbid ruminations; may be classified as minimal risk based on the severity of the depressive symptoms  PLAN OF CARE: Psychoeducation with the patient about how I think this pretty much confirms that he has bipolar disorder.  Review appropriate plans for treatment and the risks and benefits of treatment versus nontreatment.  Patient will have dangerousness reassessed prior to discharge.  Continue in individual daily assessment and group therapy.  I certify that inpatient services furnished can reasonably be expected to improve the patient's condition.   Alethia Berthold, MD 07/16/2019, 2:24 PM
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
## 2019-07-16 NOTE — Progress Notes (Signed)
Recreation Therapy Notes  Date: 07/16/2019  Time: 9:30 am  Location: Craft Room  Behavioral response: Appropriate   Intervention Topic: Problem-Solving    Discussion/Intervention:  Group content on today was focused on problem solving. The group described what problem solving is. Patients expressed how problems affect them and how they deal with problems. Individuals identified healthy ways to deal with problems. Patients explained what normally happens to them when they do not deal with problems. The group expressed reoccurring problems for them. The group participated in the intervention "Ways to Solve problems" where patients were given a chance to explore different ways to solve problems.  Clinical Observations/Feedback:  Patient came to group and explained that he struggles with problem solving he normally just tries to drown his problems. He expressed he normally does not ask for help because he does not want to feel less than. Individual was social with peers and staff while participant in the intervention during group.  Anara Cowman LRT/CTRS         Robertson Colclough 07/16/2019 12:10 PM

## 2019-07-16 NOTE — ED Notes (Signed)
Hourly rounding reveals patient sleeping in room. No complaints, stable, in no acute distress. Q15 minute rounds and monitoring via Rover and Officer to continue.  

## 2019-07-16 NOTE — Tx Team (Signed)
Initial Treatment Plan 07/16/2019 4:22 AM [name] [name] [id number]    PATIENT STRESSORS: Medication change or noncompliance Substance abuse   PATIENT STRENGTHS: Motivation for treatment/growth Supportive family/friends   PATIENT IDENTIFIED PROBLEMS: Psychosis     Depression       Substance abuse            DISCHARGE CRITERIA:  Improved stabilization in mood, thinking, and/or behavior Motivation to continue treatment in a less acute level of care  PRELIMINARY DISCHARGE PLAN: Outpatient therapy  PATIENT/FAMILY INVOLVEMENT: This treatment plan has been presented to and reviewed with Roger patient, Roger [name], Roger patient and family have been given Roger opportunity to ask questions and make suggestions.  [name], RN 07/16/2019, 4:22 AM

## 2019-07-17 MED ORDER — PALIPERIDONE PALMITATE ER 234 MG/1.5ML IM SUSY
234.0000 mg | PREFILLED_SYRINGE | INTRAMUSCULAR | Status: DC
  Administered 2019-07-17: 234 mg via INTRAMUSCULAR
  Filled 2019-07-17: qty 1.5

## 2019-07-17 NOTE — Progress Notes (Signed)
Recreation Therapy Notes  Date: 07/17/2019  Time: 9:30 am  Location: Room 21   Behavioral response: Appropriate   Intervention Topic: Animal Assisted Therapy   Discussion/Intervention:  Animal Assisted Therapy took place today during group.  Animal Assisted Therapy is the planned inclusion of an animal in a patient's treatment plan. The patients were able to engage in therapy with an animal during group. Participants were educated on what a service dog is and the different between a support dog and a service dog. Patient were informed on the many animal needs there are and how their needs are similar. Individuals were enlightened on the process to get a service animal or support animal. Patients got the opportunity to pet the animal and were offered emotional support from the animal and staff.  Clinical Observations/Feedback:  Patient came to group and was on topic and was focused on what peers and staff had to say. Participant shared their experiences and history with animals. Individual was social with peers, staff and animal while participating in group.  Deejay Koppelman LRT/CTRS         Isela Stantz 07/17/2019 11:25 AM

## 2019-07-17 NOTE — Progress Notes (Signed)
[facility] MD Progress Note  07/17/2019 1:57 PM Roger [name]  MRN:  [id number]   Subjective: Follow-up for this 29 year old male diagnosed with bipolar affective disorder with psychotic behavior.  Patient reports that he is feeling much better today than he did yesterday.  He states that he feels like his mind is calming down and he slept very well last night.  He states that he feels like for months he is only slept a couple hours every 3 to 4 days.  He states that he is glad that things have calmed down, but he still has a little bit of concern of things in his body and is now questioning why did this happen to him and what is causing him to have these issues.  He reports that he realizes how much the medication has helped and that is he is in agreement with starting the Gean [place]chwood to help him stay stable.  He denies any suicidal or homicidal ideations and currently denies any hallucinations.  He states that his appetite is been exceptionally well and feels like he just cannot get enough to eat, but does state that prior to coming here he may not have been eating enough.  Principal Problem: Bipolar affective disorder, mixed, severe, with psychotic behavior (HCC) Diagnosis: Principal Problem:   Bipolar affective disorder, mixed, severe, with psychotic behavior (HCC) Active Problems:   Paranoia (HCC)  Total Time spent with patient: 30 minutes  Past Psychiatric History: Patient has had several prior hospitalizations the most recent one we know of was 2 years ago.  That diagnosis was major depression with psychotic features.  It looks like all of his admissions have been with dysphoric mood and psychotic symptoms.  In the past a lot of this was mixed with substance abuse which does not seem to be particularly active at this time.  Patient has never been compliant with outpatient treatment  Past Medical History:  Past Medical History:  Diagnosis Date  . Depression   . Headache   .  Substance abuse [facility])     Past Surgical History:  Procedure Laterality Date  . FEMUR FRACTURE SURGERY     Family History:  Family History  Problem Relation Age of Onset  . Alcohol abuse Father    Family Psychiatric  History: Patient's mother and at least 1 sibling I believe a sister are both noted to have bipolar disorder Social History:  Social History   Substance and Sexual Activity  Alcohol Use Yes  . Alcohol/week: 10.0 standard drinks  . Types: 3 Cans of beer, 7 Shots of liquor per week     Social History   Substance and Sexual Activity  Drug Use Yes  . Types: Amphetamines, Cocaine, Marijuana, Benzodiazepines, MDMA (Ecstacy), Hydrocodone    Social History   Socioeconomic History  . Marital status: Single    Spouse name: Not on file  . Number of children: Not on file  . Years of education: Not on file  . Highest education level: Not on file  Occupational History  . Not on file  Tobacco Use  . Smoking status: Current Every Day Smoker    Packs/day: 0.50    Years: 10.00    Pack years: 5.00    Types: Cigarettes  . Smokeless tobacco: Former Neurosurgeon    Quit date: 11/24/2014  Substance and Sexual Activity  . Alcohol use: Yes    Alcohol/week: 10.0 standard drinks    Types: 3 Cans of beer, 7 Shots of liquor  per week  . Drug use: Yes    Types: Amphetamines, Cocaine, Marijuana, Benzodiazepines, MDMA (Ecstacy), Hydrocodone  . Sexual activity: Yes    Birth control/protection: None  Other Topics Concern  . Not on file  Social History Narrative  . Not on file   Social Determinants of Health   Financial Resource Strain:   . Difficulty of Paying Living Expenses:   Food Insecurity:   . Worried About Programme researcher, broadcasting/film/videounning Out of Food in the Last Year:   . Baristaan Out of Food in the Last Year:   Transportation Needs:   . Freight forwarderLack of Transportation (Medical):   Marland Kitchen. Lack of Transportation (Non-Medical):   Physical Activity:   . Days of Exercise per Week:   . Minutes of Exercise per Session:    Stress:   . Feeling of Stress :   Social Connections:   . Frequency of Communication with Friends and Family:   . Frequency of Social Gatherings with Friends and Family:   . Attends Religious Services:   . Active Member of Clubs or Organizations:   . Attends BankerClub or Organization Meetings:   Marland Kitchen. Marital Status:    Additional Social History:                         Sleep: Good  Appetite:  Good  Current Medications: Current Facility-Administered Medications  Medication Dose Route Frequency Provider Last Rate Last Admin  . acetaminophen (TYLENOL) tablet 650 mg  650 mg Oral Q6H PRN Gillermo Murdochhompson, Jacqueline, NP      . alum & mag hydroxide-simeth (MAALOX/MYLANTA) 200-200-20 MG/5ML suspension 30 mL  30 mL Oral Q4H PRN Gillermo Murdochhompson, Jacqueline, NP      . LORazepam (ATIVAN) tablet 2 mg  2 mg Oral Q6H PRN Clapacs, Jackquline DenmarkJohn T, MD   2 mg at 07/16/19 1454  . magnesium hydroxide (MILK OF MAGNESIA) suspension 30 mL  30 mL Oral Daily PRN Gillermo Murdochhompson, Jacqueline, NP      . paliperidone (INVEGA SUSTENNA) injection 234 mg  234 mg Intramuscular Q28 days Quante Pettry, Gerlene Burdockravis B, [place]FNP      . paliperidone (INVEGA) 24 hr tablet 6 mg  6 mg Oral Daily Clapacs, Jackquline DenmarkJohn T, MD   6 mg at 07/17/19 0802  . temazepam (RESTORIL) capsule 15 mg  15 mg Oral QHS PRN Clapacs, Jackquline DenmarkJohn T, MD        Lab Results:  Results for orders placed or performed during the hospital encounter of 07/15/19 (from the past 48 hour(s))  Comprehensive metabolic panel     Status: Abnormal   Collection Time: 07/15/19  4:34 PM  Result Value Ref Range   Sodium 138 135 - 145 mmol/L   Potassium 4.3 3.5 - 5.1 mmol/L   Chloride 100 98 - 111 mmol/L   CO2 28 22 - 32 mmol/L   Glucose, Bld 101 (H) 70 - 99 mg/dL    Comment: Glucose reference range applies only to samples taken after fasting for at least 8 hours.   BUN 13 6 - 20 mg/dL   Creatinine, Ser 3.240.91 0.61 - 1.24 mg/dL   Calcium 9.4 8.9 - 40.110.3 mg/dL   Total Protein 7.3 6.5 - 8.1 g/dL   Albumin 4.5 3.5 - 5.0  g/dL   AST 23 15 - 41 U/L   ALT 20 0 - 44 U/L   Alkaline Phosphatase 56 38 - 126 U/L   Total Bilirubin 1.0 0.3 - 1.2 mg/dL   GFR calc non Af Amer >60 >  60 mL/min   GFR calc Af Amer >60 >60 mL/min   Anion gap 10 5 - 15    Comment: Performed at [facility], [address] [place], [place] [address]  Ethanol     Status: None   Collection Time: 07/15/19  4:34 PM  Result Value Ref Range   Alcohol, Ethyl (B) <10 <10 mg/dL    Comment: (NOTE) Lowest detectable limit for serum alcohol is 10 mg/dL. For medical purposes only. Performed at [facility], [address] [place], [place] [address]   Salicylate level     Status: Abnormal   Collection Time: 07/15/19  4:34 PM  Result Value Ref Range   Salicylate Lvl <7.0 (L) 7.0 - 30.0 mg/dL    Comment: Performed at [facility], [address] [place], [place] [address]  Acetaminophen level     Status: Abnormal   Collection Time: 07/15/19  4:34 PM  Result Value Ref Range   Acetaminophen (Tylenol), Serum <10 (L) 10 - 30 ug/mL    Comment: (NOTE) Therapeutic concentrations vary significantly. A range of 10-30 ug/mL  may be an effective concentration for many patients. However, some  are best treated at concentrations outside of this range. Acetaminophen concentrations >150 ug/mL at 4 hours after ingestion  and >50 ug/mL at 12 hours after ingestion are often associated with  toxic reactions. Performed at [facility], [address] [place], [place] [address]   cbc     Status: Abnormal   Collection Time: 07/15/19  4:34 PM  Result Value Ref Range   WBC 10.1 4.0 - 10.5 K/uL    Comment: WHITE COUNT CONFIRMED ON SMEAR   RBC 5.01 4.22 - 5.81 MIL/uL   Hemoglobin 15.3 13.0 - 17.0 g/dL   HCT 29.5 62.1 - 30.8 %   MCV 89.0 80.0 - 100.0 fL   MCH 30.5 26.0 - 34.0 pg   MCHC 34.3 30.0 - 36.0 g/dL   RDW 65.7 84.6 - 96.2 %   Platelets 421 (H) 150 - 400 K/uL   nRBC 0.0 0.0 - 0.2 %    Comment: Performed at  [facility], [address], [place], [place] [address]  Urine Drug Screen, Qualitative     Status: Abnormal   Collection Time: 07/15/19  4:34 PM  Result Value Ref Range   Tricyclic, Ur Screen NONE DETECTED NONE DETECTED   Amphetamines, Ur Screen NONE DETECTED NONE DETECTED   MDMA (Ecstasy)Ur Screen NONE DETECTED NONE DETECTED   Cocaine Metabolite,Ur [place] NONE DETECTED NONE DETECTED   Opiate, Ur Screen NONE DETECTED NONE DETECTED   Phencyclidine (PCP) Ur S NONE DETECTED NONE DETECTED   Cannabinoid 50 Ng, Ur [place] POSITIVE (A) NONE DETECTED   Barbiturates, Ur Screen NONE DETECTED NONE DETECTED   Benzodiazepine, Ur Scrn NONE DETECTED NONE DETECTED   Methadone Scn, Ur NONE DETECTED NONE DETECTED    Comment: (NOTE) Tricyclics + metabolites, urine    Cutoff 1000 ng/mL Amphetamines + metabolites, urine  Cutoff 1000 ng/mL MDMA (Ecstasy), urine              Cutoff 500 ng/mL Cocaine Metabolite, urine          Cutoff 300 ng/mL Opiate + metabolites, urine        Cutoff 300 ng/mL Phencyclidine (PCP), urine         Cutoff 25 ng/mL Cannabinoid, urine                 Cutoff 50 ng/mL  Barbiturates + metabolites, urine  Cutoff 200 ng/mL Benzodiazepine, urine              Cutoff 200 ng/mL Methadone, urine                   Cutoff 300 ng/mL The urine drug screen provides only a preliminary, unconfirmed analytical test result and should not be used for non-medical purposes. Clinical consideration and professional judgment should be applied to any positive drug screen result due to possible interfering substances. A more specific alternate chemical method must be used in order to obtain a confirmed analytical result. Gas chromatography / mass spectrometry (GC/MS) is the preferred confirmat ory method. Performed at [facility], [address] [place], [place] [address]   Troponin I (High Sensitivity)     Status: None   Collection Time: 07/15/19  4:34 PM  Result Value Ref Range    Troponin I (High Sensitivity) 4 <18 ng/L    Comment: (NOTE) Elevated high sensitivity troponin I (hsTnI) values and significant  changes across serial measurements may suggest ACS but many other  chronic and acute conditions are known to elevate hsTnI results.  Refer to the "Links" section for chest pain algorithms and additional  guidance. Performed at [facility], [address] [place], [place] [address]   Respiratory Panel by RT PCR (Flu A&B, Covid) - Nasopharyngeal Swab     Status: None   Collection Time: 07/15/19  9:19 PM   Specimen: Nasopharyngeal Swab  Result Value Ref Range   SARS Coronavirus 2 by RT PCR NEGATIVE NEGATIVE    Comment: (NOTE) SARS-CoV-2 target nucleic acids are NOT DETECTED. The SARS-CoV-2 RNA is generally detectable in upper respiratoy specimens during the acute phase of infection. The lowest concentration of SARS-CoV-2 viral copies this assay can detect is 131 copies/mL. A negative result does not preclude SARS-Cov-2 infection and should not be used as the sole basis for treatment or other patient management decisions. A negative result may occur with  improper specimen collection/handling, submission of specimen other than nasopharyngeal swab, presence of viral mutation(s) within the areas targeted by this assay, and inadequate number of viral copies (<131 copies/mL). A negative result must be combined with clinical observations, patient history, and epidemiological information. The expected result is Negative. Fact Sheet for Patients:  https://www.moore.com/ Fact Sheet for Healthcare Providers:  https://www.young.biz/ This test is not yet ap proved or cleared by the [place] FDA and  has been authorized for detection and/or diagnosis of SARS-CoV-2 by FDA under an Emergency Use Authorization (EUA). This EUA will remain  in effect (meaning this test can be used) for the duration of the COVID-19  declaration under Section 564(b)(1) of the Act, 21 U.S.C. section 360bbb-3(b)(1), unless the authorization is terminated or revoked sooner.    Influenza A by PCR NEGATIVE NEGATIVE   Influenza B by PCR NEGATIVE NEGATIVE    Comment: (NOTE) The Xpert Xpress SARS-CoV-2/FLU/RSV assay is intended as an aid in  the diagnosis of influenza from Nasopharyngeal swab specimens and  should not be used as a sole basis for treatment. Nasal washings and  aspirates are unacceptable for Xpert Xpress SARS-CoV-2/FLU/RSV  testing. Fact Sheet for Patients: https://www.moore.com/ Fact Sheet for Healthcare Providers: https://www.young.biz/ This test is not yet approved or cleared by the [place] FDA and  has been authorized for detection and/or diagnosis of SARS-CoV-2 by  FDA under an Emergency Use Authorization (EUA). This EUA will remain  in effect (meaning this test  can be used) for the duration of the  Covid-19 declaration under Section 564(b)(1) of the Act, 21  U.S.C. section 360bbb-3(b)(1), unless the authorization is  terminated or revoked. Performed at [facility], [address] [place], [place] [address]     Blood Alcohol level:  Lab Results  Component Value Date   [facility] <10 07/15/2019   ETH <10 15/40/0867    Metabolic Disorder Labs: Lab Results  Component Value Date   HGBA1C 5.7 (H) 06/01/2017   MPG 116.89 06/01/2017   Lab Results  Component Value Date   PROLACTIN 25.5 (H) 12/25/2015   Lab Results  Component Value Date   CHOL 139 06/01/2017   TRIG 46 06/01/2017   HDL 38 (L) 06/01/2017   CHOLHDL 3.7 06/01/2017   VLDL 9 06/01/2017   LDLCALC 92 06/01/2017   LDLCALC 80 12/25/2015    Physical Findings: AIMS:  , ,  ,  ,    CIWA:    COWS:     Musculoskeletal: Strength & Muscle Tone: within normal limits Gait & Station: normal Patient leans: N/A  Psychiatric Specialty Exam: Physical Exam  Nursing note and vitals  reviewed. Constitutional: He is oriented to person, place, and time. He appears well-developed and well-nourished.  Cardiovascular: Normal rate.  Respiratory: Effort normal.  Musculoskeletal:        General: Normal range of motion.  Neurological: He is alert and oriented to person, place, and time.  Skin: Skin is warm.    Review of Systems  Constitutional: Negative.   HENT: Negative.   Eyes: Negative.   Respiratory: Negative.   Cardiovascular: Negative.   Gastrointestinal: Negative.   Genitourinary: Negative.   Musculoskeletal: Negative.   Skin: Negative.   Neurological: Negative.   Psychiatric/Behavioral:       Reports still feeling some paranoia and delusions of things in his body    Blood pressure 128/86, pulse 62, temperature 98.8 F (37.1 C), temperature source Oral, resp. rate 16, height 5\' 6"  (1.676 m), weight 56.7 kg, SpO2 100 %.Body mass index is 20.18 kg/m.  General Appearance: Casual  Eye Contact:  Fair  Speech:  Clear and Coherent and Normal Rate  Volume:  Normal  Mood:  Anxious  Affect:  Congruent  Thought Process:  Linear and Descriptions of Associations: Circumstantial  Orientation:  Full (Time, Place, and Person)  Thought Content:  Delusions and Paranoid Ideation  Suicidal Thoughts:  No  Homicidal Thoughts:  No  Memory:  Immediate;   Fair Recent;   Fair Remote;   Fair  Judgement:  Fair  Insight:  Fair  Psychomotor Activity:  Normal  Concentration:  Concentration: Fair  Recall:  [employer] of Knowledge:  Fair  Language:  Fair  Akathisia:  No  Handed:  Right  AIMS (if indicated):     Assets:  Desire for Improvement Housing Social Support Transportation  ADL's:  Intact  Cognition:  WNL  Sleep:  Number of Hours: 8.25   Assessment: Patient presents in his room with a blanket wrapped around him sitting on his bed.  Patient is pleasant, calm, and cooperative during the evaluation.  His conversation and speech are much more coherent and logical today.   His affect is congruent and he is to the point that he understands that the feelings of things in his bodies and the hallucinations he was having do not make sense now and is willing to understand why this happened to him.  Patient is in agreement with getting stabilized prior  to going to full discussion with why this could have happened to him.  It was discussed with Dr. Toni Amend to start patient on long-acting injectable and patient has agreed to start the [place] 234 mg IM today.  Patient has shown improvement with his speech, thought process, compliance, sleep, and his appetite.  Treatment Plan Summary: Daily contact with patient to assess and evaluate symptoms and progress in treatment and Medication management Continue Ativan 2 mg p.o. every 6 hours as needed for anxiety Continue Invega 6 mg p.o. daily for bipolar affective disorder Start Invega [place] 234 mg IM q. 28 days for bipolar affective disorder Continue Restoril 15 mg p.o. nightly as needed for insomnia Encourage group therapy participation Continue every 15 minute safety checks  Maryfrances Bunnell, FNP 07/17/2019, 1:57 PM

## 2019-07-17 NOTE — Plan of Care (Signed)
  Problem: Education: Goal: Knowledge of  General Education information/materials will improve Outcome: Not Progressing Goal: Emotional status will improve Outcome: Not Progressing Goal: Mental status will improve Outcome: Not Progressing Goal: Verbalization of understanding the information provided will improve Outcome: Not Progressing  D: In bed all shift with covers pulled over his head. Medication compliant. Still thinks he has a Ambulance person in him. Denies SI and HI. Forwards very little in conversation.  A: Continue to monitor for safety R: Safety maintained

## 2019-07-17 NOTE — Progress Notes (Signed)
Recreation Therapy Notes  INPATIENT RECREATION THERAPY ASSESSMENT  Patient Details Name: Roger [name] MRN: [id number] DOB: [birth year] Today's Date: 07/17/2019       Information Obtained From: Patient  Able to Participate in Assessment/Interview: Yes  Patient Presentation: Responsive  Reason for Admission (Per Patient): Active Symptoms  Patient Stressors:    Coping Skills:   Music, Talk, Other (Comment)(Fishing)  Leisure Interests (2+):  Nature - Ambulance person of Recreation/Participation:    Awareness of Community Resources:     [employer]:     Current Use:    If no, Barriers?:    Expressed Interest in [employer] Information:    [employer] of Residence:  Film/video editor  Patient Main Form of Transportation: Car  Patient Strengths:  Good listener  Patient Identified Areas of Improvement:  Acceptance  Patient Goal for Hospitalization:  To be discharged  Current SI (including self-harm):  No  Current HI:  No  Current AVH: No  Staff Intervention Plan: Group Attendance, Collaborate with Interdisciplinary Treatment Team  Consent to Intern Participation: N/A  Roger [name] 07/17/2019, 3:55 PM

## 2019-07-17 NOTE — Tx Team (Addendum)
Interdisciplinary Treatment and Diagnostic Plan Update  07/17/2019 Time of Session: [address] [name] MRN: [id number]  Principal Diagnosis: Bipolar affective disorder, mixed, severe, with psychotic behavior ([name])  Secondary Diagnoses: Principal Problem:   Bipolar affective disorder, mixed, severe, with psychotic behavior ([name]) Active Problems:   Paranoia ([name])   Current Medications:  Current Facility-Administered Medications  Medication Dose Route Frequency Provider Last Rate Last Admin  . acetaminophen (TYLENOL) tablet 650 mg  650 mg Oral Q6H PRN Caroline Sauger, NP      . alum & mag hydroxide-simeth (MAALOX/MYLANTA) 200-200-20 MG/5ML suspension 30 mL  30 mL Oral Q4H PRN Caroline Sauger, NP      . LORazepam (ATIVAN) tablet 2 mg  2 mg Oral Q6H PRN Clapacs, Madie Reno, MD   2 mg at 07/16/19 1454  . magnesium hydroxide (MILK OF MAGNESIA) suspension 30 mL  30 mL Oral Daily PRN Caroline Sauger, NP      . paliperidone (INVEGA) 24 hr tablet 6 mg  6 mg Oral Daily Clapacs, Madie Reno, MD   6 mg at 07/17/19 0802  . temazepam (RESTORIL) capsule 15 mg  15 mg Oral QHS PRN Clapacs, Madie Reno, MD       PTA Medications: Medications Prior to Admission  Medication Sig Dispense Refill Last Dose  . fluvoxaMINE (LUVOX) 100 MG tablet Take 1 tablet (100 mg total) by mouth at bedtime. 30 tablet 1   . OLANZapine (ZYPREXA) 10 MG tablet Take 1 tablet (10 mg total) by mouth at bedtime. 30 tablet 1   . traZODone (DESYREL) 100 MG tablet Take 1 tablet (100 mg total) by mouth at bedtime. 30 tablet 1     Patient Stressors: Medication change or noncompliance Substance abuse  Patient Strengths: Motivation for treatment/growth Supportive family/friends  Treatment Modalities: Medication Management, Group therapy, Case management,  1 to 1 session with clinician, Psychoeducation, Recreational therapy.   Physician Treatment Plan for Primary Diagnosis: Bipolar affective disorder, mixed, severe, with  psychotic behavior ([name]) Long Term Goal(s): Improvement in symptoms so as ready for discharge Improvement in symptoms so as ready for discharge   Short Term Goals: Ability to verbalize feelings will improve Ability to demonstrate self-control will improve Ability to identify and develop effective coping behaviors will improve Ability to maintain clinical measurements within normal limits will improve Compliance with prescribed medications will improve  Medication Management: Evaluate patient's response, side effects, and tolerance of medication regimen.  Therapeutic Interventions: 1 to 1 sessions, Unit Group sessions and Medication administration.  Evaluation of Outcomes: Not Met  Physician Treatment Plan for Secondary Diagnosis: Principal Problem:   Bipolar affective disorder, mixed, severe, with psychotic behavior ([name]) Active Problems:   Paranoia ([name])  Long Term Goal(s): Improvement in symptoms so as ready for discharge Improvement in symptoms so as ready for discharge   Short Term Goals: Ability to verbalize feelings will improve Ability to demonstrate self-control will improve Ability to identify and develop effective coping behaviors will improve Ability to maintain clinical measurements within normal limits will improve Compliance with prescribed medications will improve     Medication Management: Evaluate patient's response, side effects, and tolerance of medication regimen.  Therapeutic Interventions: 1 to 1 sessions, Unit Group sessions and Medication administration.  Evaluation of Outcomes: Not Met   RN Treatment Plan for Primary Diagnosis: Bipolar affective disorder, mixed, severe, with psychotic behavior ([name]) Long Term Goal(s): Knowledge of disease and therapeutic regimen to maintain health will improve  Short Term Goals: Ability to demonstrate self-control, Ability  to verbalize feelings will improve, Ability to identify and develop effective coping behaviors  will improve and Compliance with prescribed medications will improve  Medication Management: RN will administer medications as ordered by provider, will assess and evaluate patient's response and provide education to patient for prescribed medication. RN will report any adverse and/or side effects to prescribing provider.  Therapeutic Interventions: 1 on 1 counseling sessions, Psychoeducation, Medication administration, Evaluate responses to treatment, Monitor vital signs and CBGs as ordered, Perform/monitor CIWA, COWS, AIMS and Fall Risk screenings as ordered, Perform wound care treatments as ordered.  Evaluation of Outcomes: Not Met   LCSW Treatment Plan for Primary Diagnosis: Bipolar affective disorder, mixed, severe, with psychotic behavior ([name]) Long Term Goal(s): Safe transition to appropriate next level of care at discharge, Engage patient in therapeutic group addressing interpersonal concerns.  Short Term Goals: Engage patient in aftercare planning with referrals and resources, Facilitate acceptance of mental health diagnosis and concerns, Identify triggers associated with mental health/substance abuse issues and Increase skills for wellness and recovery  Therapeutic Interventions: Assess for all discharge needs, 1 to 1 time with Social worker, Explore available resources and support systems, Assess for adequacy in community support network, Educate family and significant other(s) on suicide prevention, Complete Psychosocial Assessment, Interpersonal group therapy.  Evaluation of Outcomes: Not Met   Progress in Treatment: Attending groups: No. Participating in groups: No. Taking medication as prescribed: Yes. Toleration medication: Yes. Family/Significant other contact made: Yes, individual(s) contacted:  pts grandmother Patient understands diagnosis: No. Discussing patient identified problems/goals with staff: Yes. Medical problems stabilized or resolved: Yes. Denies  suicidal/homicidal ideation: Yes. Issues/concerns per patient self-inventory: No. Other: N/A  New problem(s) identified: No, Describe:  none  New Short Term/Long Term Goal(s): Detox, elimination of AVH/symptoms of psychosis, medication management for mood stabilization; elimination of SI thoughts; development of comprehensive mental wellness/sobriety plan.   Patient Goals:  "Figure out what was going on with me"  Discharge Plan or Barriers: SPE pamphlet, Mobile Crisis information, and AA/NA information provided to patient for additional community support and resources. Pt currently declines outpatient referrals. CSW assessing for appropriate referrals.  Reason for Continuation of Hospitalization: Delusions  Hallucinations Medication stabilization  Estimated Length of Stay: 5-7 days  Recreational Therapy: Patient Stressors: N/A Patient Goal: Patient will engage in groups without prompting or encouragement from LRT x3 group sessions within 5 recreation therapy group sessions  Attendees: Patient: Roger [name] 07/17/2019 10:26 AM  Physician: Dr Weber Cooks MD 07/17/2019 10:26 AM  Nursing: Collier Bullock RN 07/17/2019 10:26 AM  RN Care Manager: 07/17/2019 10:26 AM  Social Worker: Minette Brine Moton LCSW 07/17/2019 10:26 AM  Recreational Therapist: Roanna Epley CTRS LRT 07/17/2019 10:26 AM  Other: Assunta Curtis LCSW  07/17/2019 10:26 AM  Other:  07/17/2019 10:26 AM  Other: 07/17/2019 10:26 AM    Scribe for Treatment Team: Mariann Laster Moton, LCSW 07/17/2019 10:26 AM

## 2019-07-17 NOTE — [facility] Group Notes (Signed)
[facility] Group Notes:  (Nursing/MHT/Case Management/Adjunct)  Date:  07/17/2019  Time:  10:07 PM  Type of Therapy:  Group Therapy  Participation Level:  Active  Participation Quality:  Appropriate and Sharing  Affect:  Appropriate  Cognitive:  Appropriate  Insight:  Appropriate  Engagement in Group:  Engaged  Modes of Intervention:  Discussion, Education and Support  Summary of Progress/Problems: PT has in house goals which consist of calming down while in [facility]. He also wants to take the time to get his mind back focused. Once discharged he wants to make better decisions and tackle situations head on and stop running from the situations  Roger [name] 07/17/2019, 10:07 PM

## 2019-07-17 NOTE — Progress Notes (Signed)
CSW contacted pts grandmother due to note from RN stating grandmother wanted a call. Grandmother inquired about pts medications. CSW explained she is unsure but doctor was discussing putting pt on a shot. Per grandmother that is all she wanted and she is happy pt is getting a shot. Grandmother reported that pt called her this morning and "was talking crazy saying he could see something in my eyes".   Iris Pert, MSW, LCSW Clinical Social Work 07/17/2019 10:35 AM

## 2019-07-17 NOTE — [facility] Group Notes (Signed)
  LCSW Group Therapy Note  07/17/2019 2:28 PM   Type of Therapy/Topic:  Group Therapy:  Feelings about Diagnosis  Participation Level:  Minimal   Description of Group:   This group will allow patients to explore their thoughts and feelings about diagnoses they have received. Patients will be guided to explore their level of understanding and acceptance of these diagnoses. Facilitator will encourage patients to process their thoughts and feelings about the reactions of others to their diagnosis and will guide patients in identifying ways to discuss their diagnosis with significant others in their lives. This group will be process-oriented, with patients participating in exploration of their own experiences, giving and receiving support, and processing challenge from other group members.   Therapeutic Goals: 1. Patient will demonstrate understanding of diagnosis as evidenced by identifying two or more symptoms of the disorder 2. Patient will be able to express two feelings regarding the diagnosis 3. Patient will demonstrate their ability to communicate their needs through discussion and/or role play  Summary of Patient Progress: Pt was present in group and participated in the group activity, but did not engage in the group discussion.   Therapeutic Modalities:   Cognitive Behavioral Therapy Brief Therapy Feelings Identification    Iris Pert, MSW, LCSW Clinical Social Work 07/17/2019 2:28 PM

## 2019-07-17 NOTE — Progress Notes (Addendum)
Pt has been calm and cooperative all day. He has attended groups. He has been med compliant with a goal to be discharged soon. He says that his father has gone to Hospice. Torrie Mayers RN

## 2019-07-17 NOTE — Progress Notes (Signed)
D: In bed all shift with covers pulled over his head. Medication compliant. Still thinks he has a Ambulance person in him. Denies SI and HI. Forwards very little in conversation.  A: Continue to monitor for safety R: Safety maintained

## 2019-07-17 NOTE — Plan of Care (Signed)
Pt denies anxiety, depression, SI, HI and AVH. Pt was educated on care plan and verbalizes understanding. Pt was encouraged to attend groups and has. Torrie Mayers RN Problem: Education: Goal: Knowledge of [facility] Education information/materials will improve Outcome: Progressing Goal: Emotional status will improve Outcome: Progressing Goal: Mental status will improve Outcome: Not Progressing Goal: Verbalization of understanding the information provided will improve Outcome: Progressing   Problem: Safety: Goal: Periods of time without injury will increase Outcome: Progressing   Problem: Education: Goal: Will be free of psychotic symptoms Outcome: Not Progressing Goal: Knowledge of the prescribed therapeutic regimen will improve Outcome: Progressing   Problem: Safety: Goal: Ability to redirect hostility and anger into socially appropriate behaviors will improve Outcome: Progressing Goal: Ability to remain free from injury will improve Outcome: Progressing

## 2019-07-18 MED ORDER — PALIPERIDONE ER 6 MG PO TB24: 6.0000 mg | ORAL_TABLET | Freq: Every day | ORAL | 1 refills | Status: DC

## 2019-07-18 MED ORDER — PALIPERIDONE ER 6 MG PO TB24: 6.0000 mg | ORAL_TABLET | Freq: Every day | ORAL | 0 refills | Status: DC

## 2019-07-18 MED ORDER — TEMAZEPAM 15 MG PO CAPS: 15.0000 mg | ORAL_CAPSULE | Freq: Every evening | ORAL | 0 refills | Status: DC | PRN

## 2019-07-18 MED ORDER — PALIPERIDONE PALMITATE ER 234 MG/1.5ML IM SUSY: 234.0000 mg | PREFILLED_SYRINGE | INTRAMUSCULAR | 1 refills | Status: DC

## 2019-07-18 NOTE — Progress Notes (Signed)
Recreation Therapy Notes  INPATIENT RECREATION TR PLAN  Patient Details Name: Roger [name] MRN: [id number] DOB: Dec 05, 1990 Today's Date: 07/18/2019  Rec Therapy Plan Is patient appropriate for Therapeutic Recreation?: Yes Treatment times per week: at least 3 Estimated Length of Stay: 5-7 days TR Treatment/Interventions: Group participation (Comment)  Discharge Criteria Pt will be discharged from therapy if:: Discharged Treatment plan/goals/alternatives discussed and agreed upon by:: Patient/family  Discharge Summary Short term goals set: Patient will engage in groups without prompting or encouragement from LRT x3 group sessions within 5 recreation therapy group sessions Short term goals met: Complete Progress toward goals comments: Groups attended Which groups?: Anger management, AAA/T, Other (Comment)(Problem Solving) Reason goals not met: N/A Therapeutic equipment acquired: N/A Reason patient discharged from therapy: Discharge from hospital Pt/family agrees with progress & goals achieved: Yes Date patient discharged from therapy: 07/18/19   Oswald Pott 07/18/2019, 12:07 PM

## 2019-07-18 NOTE — Discharge Summary (Signed)
Physician Discharge Summary Note  Patient:  Roger [name] is an 29 y.o., male MRN:  [id number] DOB:  [birth year] Patient phone:  [phone] (home)  Patient address:   [address] [place] [address],  Total Time spent with patient: 30 minutes  Date of Admission:  07/16/2019 Date of Discharge: 07/18/19  Reason for Admission:  29 year old man with a past history of admissions for psychosis and mood symptoms who came to the emergency room with a combination of psychotic symptoms and mood instability.  Patient tells me that starting a little over a month ago when his sister moved from [place] down to [place] he started having visual hallucinations.  He describes a strange experience of seeing an alternate version of her standing behind her.  Since then these have become more frequent and consistent.  He is also hearing things at times.  His mood has been anxious and confused and he has not been able to work effectively.  His sleep is erratic and he is frequently going days without sleep.  Patient talks about having the belief that things in music that he hears or the looks of other people around him on the street are communicating about his own condition.  Principal Problem: Bipolar affective disorder, mixed, severe, with psychotic behavior ([facility]) Discharge Diagnoses: Principal Problem:   Bipolar affective disorder, mixed, severe, with psychotic behavior ([facility]) Active Problems:   Paranoia ([facility])   Past Psychiatric History: Patient has had several prior hospitalizations the most recent one we know of was 2 years ago.  That diagnosis was major depression with psychotic features.  It looks like all of his admissions have been with dysphoric mood and psychotic symptoms.  In the past a lot of this was mixed with substance abuse which does not seem to be particularly active at this time.  Patient has never been compliant with outpatient treatment  Past Medical History:  Past Medical  History:  Diagnosis Date  . Depression   . Headache   . Substance abuse [facility])     Past Surgical History:  Procedure Laterality Date  . FEMUR FRACTURE SURGERY     Family History:  Family History  Problem Relation Age of Onset  . Alcohol abuse Father    Family Psychiatric  History: Patient's mother and at least 1 sibling I believe a sister are both noted to have bipolar disorder Social History:  Social History   Substance and Sexual Activity  Alcohol Use Yes  . Alcohol/week: 10.0 standard drinks  . Types: 3 Cans of beer, 7 Shots of liquor per week     Social History   Substance and Sexual Activity  Drug Use Yes  . Types: Amphetamines, Cocaine, Marijuana, Benzodiazepines, MDMA (Ecstacy), Hydrocodone    Social History   Socioeconomic History  . Marital status: Single    Spouse name: Not on file  . Number of children: Not on file  . Years of education: Not on file  . Highest education level: Not on file  Occupational History  . Not on file  Tobacco Use  . Smoking status: Current Every Day Smoker    Packs/day: 0.50    Years: 10.00    Pack years: 5.00    Types: Cigarettes  . Smokeless tobacco: Former Neurosurgeon    Quit date: 11/24/2014  Substance and Sexual Activity  . Alcohol use: Yes    Alcohol/week: 10.0 standard drinks    Types: 3 Cans of beer, 7 Shots of liquor per week  .  Drug use: Yes    Types: Amphetamines, Cocaine, Marijuana, Benzodiazepines, MDMA (Ecstacy), Hydrocodone  . Sexual activity: Yes    Birth control/protection: None  Other Topics Concern  . Not on file  Social History Narrative  . Not on file   Social Determinants of Health   Financial Resource Strain:   . Difficulty of Paying Living Expenses:   Food Insecurity:   . Worried About Charity fundraiser in the Last Year:   . Arboriculturist in the Last Year:   Transportation Needs:   . Film/video editor (Medical):   Marland Kitchen Lack of Transportation (Non-Medical):   Physical Activity:   . Days of  Exercise per Week:   . Minutes of Exercise per Session:   Stress:   . Feeling of Stress :   Social Connections:   . Frequency of Communication with Friends and Family:   . Frequency of Social Gatherings with Friends and Family:   . Attends Religious Services:   . Active Member of Clubs or Organizations:   . Attends Archivist Meetings:   Marland Kitchen Marital Status:     Hospital Course:  Patient remained on the [facility] unit for 2 days. The patient stabilized on medication and therapy. Patient was discharged on Invega 6 mg Daily, Invega Sustenna 234 mg IM Q28 Days next dose due on 08/14/19, and Restoril 15 mg QHS PRN. Patient has shown improvement with improved mood, affect, sleep, appetite, and interaction. Patient has attended group and participated. Patient has been seen in the day room interacting with peers and staff appropriately. Patient denies any SI/HI/AVH and contracts for safety. Patient agrees to follow up at [facility]. Patient is provided with prescriptions for their medications upon discharge.  Physical Findings: AIMS:  , ,  ,  ,    CIWA:    COWS:     Musculoskeletal: Strength & Muscle Tone: within normal limits Gait & Station: normal Patient leans: N/A  Psychiatric Specialty Exam: Physical Exam  Nursing note and vitals reviewed. Constitutional: He is oriented to person, place, and time. He appears well-developed and well-nourished.  Respiratory: Effort normal.  Musculoskeletal:        General: Normal range of motion.  Neurological: He is alert and oriented to person, place, and time.  Skin: Skin is warm.    Review of Systems  Constitutional: Negative.   HENT: Negative.   Eyes: Negative.   Respiratory: Negative.   Cardiovascular: Negative.   Gastrointestinal: Negative.   Genitourinary: Negative.   Musculoskeletal: Negative.   Skin: Negative.   Neurological: Negative.   Psychiatric/Behavioral: Negative.     Blood pressure 120/80, pulse (!) 117, temperature 97.8 F  (36.6 C), temperature source Oral, resp. rate 17, height 5\' 6"  (1.676 m), weight 56.7 kg, SpO2 98 %.Body mass index is 20.18 kg/m.   General Appearance: Casual  Eye Contact::  Good  Speech:  Clear and Coherent409  Volume:  Normal  Mood:  Euthymic  Affect:  Congruent  Thought Process:  Coherent  Orientation:  Full (Time, Place, and Person)  Thought Content:  Logical  Suicidal Thoughts:  No  Homicidal Thoughts:  No  Memory:  Immediate;   Fair Recent;   Fair Remote;   Fair  Judgement:  Fair  Insight:  Fair  Psychomotor Activity:  Normal  Concentration:  Fair  Recall:  [employer]  Language: Fair  Akathisia:  No  Handed:  Right  AIMS (if indicated):     Assets:  Desire for Improvement Housing Physical Health Social Support  Sleep:  Number of Hours: 7.5  Cognition: WNL  ADL's:  Intact   Have you used any form of tobacco in the last 30 days? (Cigarettes, Smokeless Tobacco, Cigars, and/or Pipes): Yes  Has this patient used any form of tobacco in the last 30 days? (Cigarettes, Smokeless Tobacco, Cigars, and/or Pipes) Yes, Yes, A prescription for an FDA-approved tobacco cessation medication was offered at discharge and the patient refused  Blood Alcohol level:  Lab Results  Component Value Date   [facility] <10 07/15/2019   ETH <10 05/30/2017    Metabolic Disorder Labs:  Lab Results  Component Value Date   HGBA1C 5.7 (H) 06/01/2017   MPG 116.89 06/01/2017   Lab Results  Component Value Date   PROLACTIN 25.5 (H) 12/25/2015   Lab Results  Component Value Date   CHOL 139 06/01/2017   TRIG 46 06/01/2017   HDL 38 (L) 06/01/2017   CHOLHDL 3.7 06/01/2017   VLDL 9 06/01/2017   LDLCALC 92 06/01/2017   LDLCALC 80 12/25/2015    See Psychiatric Specialty Exam and Suicide Risk Assessment completed by Attending Physician prior to discharge.  Discharge destination:  Home  Is patient on multiple antipsychotic therapies at discharge:  No   Has Patient had three  or more failed trials of antipsychotic monotherapy by history:  No  Recommended Plan for Multiple Antipsychotic Therapies: NA  Discharge Instructions    Diet - low sodium heart healthy   Complete by: As directed    Increase activity slowly   Complete by: As directed      Allergies as of 07/18/2019      Reactions   Haldol [haloperidol Lactate]    EPS sxs      Medication List    STOP taking these medications   fluvoxaMINE 100 MG tablet Commonly known as: LUVOX   OLANZapine 10 MG tablet Commonly known as: ZYPREXA   traZODone 100 MG tablet Commonly known as: DESYREL     TAKE these medications     Indication  paliperidone 234 MG/1.5ML Susy injection Commonly known as: INVEGA SUSTENNA Inject 234 mg into the muscle every 28 (twenty-eight) days. Start taking on: August 14, 2019  Indication: Schizoaffective Disorder   paliperidone 6 MG 24 hr tablet Commonly known as: INVEGA Take 1 tablet (6 mg total) by mouth daily. Start taking on: July 19, 2019  Indication: Schizoaffective Disorder   temazepam 15 MG capsule Commonly known as: RESTORIL Take 1 capsule (15 mg total) by mouth at bedtime as needed for sleep.  Indication: Trouble Sleeping      Follow-up Information    [facility] Follow up on 07/25/2019.   Why: You have a zoom appointment for 07/25/19 at 7am with Lorella Nimrod for peer support services. Thank You! Contact information: [address] [place] [address] [phone]           Follow-up recommendations:  Continue activity as tolerated. Continue diet as recommended by your PCP. Ensure to keep all appointments with outpatient providers.  Comments:  Patient is instructed prior to discharge to: Take all medications as prescribed by his/her mental healthcare provider. Report any adverse effects and or reactions from the medicines to his/her outpatient provider promptly. Patient has been instructed & cautioned: To not engage in alcohol and or  illegal drug use while on prescription medicines. In the event of worsening symptoms, patient is instructed to call the crisis hotline, 911 and or go to  the nearest ED for appropriate evaluation and treatment of symptoms. To follow-up with his/her primary care provider for your other medical issues, concerns and or health care needs.    Signed: Gerlene Burdock Lasean Gorniak, FNP 07/18/2019, 11:26 AM

## 2019-07-18 NOTE — Progress Notes (Signed)
Patient ID: Roger [name], male   DOB: Nov 20, 1990, 29 y.o.   MRN: [id number]   Discharge Note:  Patient denies SI/HI/AVH at this time. Discharge instructions, AVS, prescriptions, transition record, and seven-day supply gone over with patient. Patient belongings returned to patient. Patient agrees to comply with medication management, follow-up visit, and outpatient therapy. Patient questions and concerns addressed and answered. Patient ambulatory off unit. Patient discharged to home with his Grandmother.

## 2019-07-18 NOTE — Plan of Care (Signed)
  Problem: Group Participation Goal: STG - Patient will engage in groups without prompting or encouragement from LRT x3 group sessions within 5 recreation therapy group sessions Description: STG - Patient will engage in groups without prompting or encouragement from LRT x3 group sessions within 5 recreation therapy group sessions Outcome: Completed/Met

## 2019-07-18 NOTE — Plan of Care (Signed)
  Problem: Education: Goal: Will be free of psychotic symptoms Outcome: Progressing  Patient appears free of psychosis.  

## 2019-07-18 NOTE — Progress Notes (Signed)
D- Patient alert and oriented. Patient presents in a pleasant mood on assessment stating that he slept ok last night and had no complaints to voice to this Clinical research associate. Patient denies SI, HI, AVH, and pain at this time. Patient also denies any signs/symptoms of anxiety, however, he reported some depression, stating "that I'm in here". Patient's goal for today is "going home", in which he will "stay positive", in order to achieve his goal.  A- Scheduled medications administered to patient, per MD orders. Support and encouragement provided.  Routine safety checks conducted every 15 minutes.  Patient informed to notify staff with problems or concerns.  R- No adverse drug reactions noted. Patient contracts for safety at this time. Patient compliant with medications and treatment plan. Patient receptive, calm, and cooperative. Patient interacts well with others on the unit.  Patient remains safe at this time.

## 2019-07-18 NOTE — [facility] Group Notes (Signed)
[facility] Group Notes:  (Nursing/MHT/Case Management/Adjunct)  Date:  07/18/2019  Time:  9:55 AM  Type of Therapy:  Community Meeting  Participation Level:  Did Not Attend   Summary of Progress/Problems:  Roger [name] 07/18/2019, 9:55 AM

## 2019-07-18 NOTE — Progress Notes (Signed)
Patient  alert and oriented x 4, affect is flat but he brightens upon approach, he denies SI/HI/AVH interacting appropriately with peers and staff, his thoughts are organized no bizarre behavior noted  he was complaint with medication regimen. 15 minutes safety checks maintained will continue to monitor.

## 2019-07-18 NOTE — Progress Notes (Signed)
Recreation Therapy Notes   Date: 07/18/2019  Time: 9:30 am  Location: Craft Room  Behavioral response: Appropriate   Intervention Topic: Anger management    Discussion/Intervention:  Group content on today was focused on anger management. The group defined anger and reasons they become angry. Individuals expressed negative way they have dealt with anger in the past. Patients stated some positive ways they could deal with anger in the future. The group described how anger can affect your health and daily plans. Individuals participated in the intervention "Blowing up anger" where they had a chance to answer questions about themselves and see how much energy they waste on being angry.  Clinical Observations/Feedback:  Patient came to group and expressed that some physical signs he is angry is when he start to breath heavy and starts to shake. He explained that he could manager his anger better by jumping in the river. Individual was social with peers and staff while participating in the intervention during group.  Kashena Novitski LRT/CTRS         Sahirah Rudell 07/18/2019 11:26 AM

## 2019-07-18 NOTE — Progress Notes (Signed)
  [facility] Adult Case Management Discharge Plan :  Will you be returning to the same living situation after discharge:  Yes,  home At discharge, do you have transportation home?: Yes,  grandmother or public transportation Do you have the ability to pay for your medications: Yes,  mental health  Release of information consent forms completed and in the chart;  Patient to Follow up at: Follow-up Information    [facility] Follow up on 07/25/2019.   Why: You have a zoom appointment for 07/25/19 at 7am with Lorella Nimrod for peer support services. Thank You! Contact information: [address] [place] [address] [phone]           Next level of care provider has access to [facility] Link:no  Safety Planning and Suicide Prevention discussed: Yes,  SPE completed with pts grandmother  Have you used any form of tobacco in the last 30 days? (Cigarettes, Smokeless Tobacco, Cigars, and/or Pipes): Yes  Has patient been referred to the Quitline?: Patient refused referral  Patient has been referred for addiction treatment: Pt. refused referral  Mechele Dawley, LCSW 07/18/2019, 11:33 AM

## 2019-07-18 NOTE — Progress Notes (Signed)
BRIEF PHARMACY NOTE   This patient attended and participated in Medication Management Group counseling led by [facility] staff pharmacist.  This interactive class reviews basic information about prescription medications and education on personal responsibility in medication management.  The class also includes general knowledge of 3 main classes of behavioral medications, including antipsychotics, antidepressants, and mood stabilizers.     Patient behavior was appropriate for group setting.   Educational materials sourced from:  "Medication Do's and Don'ts" from [email or web address]   "Mental Health Medications" from [facility] [email or web address]#part [email or web address]     Albina Billet, PharmD, BCPS Clinical Pharmacist 07/18/2019 3:24 PM

## 2019-07-18 NOTE — [facility] Suicide Risk Assessment (Signed)
[facility] Discharge Suicide Risk Assessment   Principal Problem: Bipolar affective disorder, mixed, severe, with psychotic behavior (HCC) Discharge Diagnoses: Principal Problem:   Bipolar affective disorder, mixed, severe, with psychotic behavior (HCC) Active Problems:   Paranoia (HCC)   Total Time spent with patient: 30 minutes  Musculoskeletal: Strength & Muscle Tone: within normal limits Gait & Station: normal Patient leans: N/A  Psychiatric Specialty Exam: Review of Systems  Constitutional: Negative.   HENT: Negative.   Eyes: Negative.   Respiratory: Negative.   Cardiovascular: Negative.   Gastrointestinal: Negative.   Musculoskeletal: Negative.   Skin: Negative.   Neurological: Negative.   Psychiatric/Behavioral: Negative.     Blood pressure 120/80, pulse (!) 117, temperature 97.8 F (36.6 C), temperature source Oral, resp. rate 17, height 5\' 6"  (1.676 m), weight 56.7 kg, SpO2 98 %.Body mass index is 20.18 kg/m.  General Appearance: Casual  Eye Contact::  Good  Speech:  Clear and Coherent409  Volume:  Normal  Mood:  Euthymic  Affect:  Congruent  Thought Process:  Coherent  Orientation:  Full (Time, Place, and Person)  Thought Content:  Logical  Suicidal Thoughts:  No  Homicidal Thoughts:  No  Memory:  Immediate;   Fair Recent;   Fair Remote;   Fair  Judgement:  Fair  Insight:  Fair  Psychomotor Activity:  Normal  Concentration:  Fair  Recall:  [id number] of Knowledge:Fair  Language: Fair  Akathisia:  No  Handed:  Right  AIMS (if indicated):     Assets:  Desire for Improvement Housing Physical Health Social Support  Sleep:  Number of Hours: 7.5  Cognition: WNL  ADL's:  Intact   Mental Status Per Nursing Assessment::   On Admission:  NA  Demographic Factors:  Male and Adolescent or young adult  Loss Factors: Financial problems/change in socioeconomic status  Historical Factors: Impulsivity  Risk Reduction Factors:   Living with another  person, especially a relative, Positive social support, Positive therapeutic relationship and Positive coping skills or problem solving skills  Continued Clinical Symptoms:  Bipolar Disorder:   Mixed State  Cognitive Features That Contribute To Risk:  None    Suicide Risk:  Minimal: No identifiable suicidal ideation.  Patients presenting with no risk factors but with morbid ruminations; may be classified as minimal risk based on the severity of the depressive symptoms  Follow-up Information    [facility] Follow up on 07/25/2019.   Why: You have a zoom appointment for 07/25/19 at 7am with 09/24/19 for peer support services. Thank You! Contact information: [address] [place] [phone]           Plan Of Care/Follow-up recommendations:  Activity:  Activity as tolerated Diet:  Regular diet Other:  Patient is strongly encouraged to stay on medication including the long-acting injectable.  Patient is much improved from yesterday and seems to be tolerating medication and showing a lot of improvement in insight.  Family very much would like him to come home and have spoken with him and feel that he is much improved.  At this point I think there is no safety concern and we will discharge him today with follow-up at [facility]  [facility], MD 07/18/2019, 11:53 AM

## 2019-08-18 ENCOUNTER — Emergency Department: Payer: Self-pay

## 2019-08-18 ENCOUNTER — Encounter: Payer: Self-pay | Admitting: Emergency Medicine

## 2019-08-18 DIAGNOSIS — Z5321 Procedure and treatment not carried out due to patient leaving prior to being seen by health care provider: Secondary | ICD-10-CM | POA: Insufficient documentation

## 2019-08-18 DIAGNOSIS — R0789 Other chest pain: Secondary | ICD-10-CM | POA: Insufficient documentation

## 2019-08-18 LAB — CBC
HCT: 42 % (ref 39.0–52.0)
Hemoglobin: 14.7 g/dL (ref 13.0–17.0)
MCH: 30.3 pg (ref 26.0–34.0)
MCHC: 35 g/dL (ref 30.0–36.0)
MCV: 86.6 fL (ref 80.0–100.0)
Platelets: 286 10*3/uL (ref 150–400)
RBC: 4.85 MIL/uL (ref 4.22–5.81)
RDW: 13.4 % (ref 11.5–15.5)
WBC: 9.3 10*3/uL (ref 4.0–10.5)
nRBC: 0 % (ref 0.0–0.2)

## 2019-08-18 LAB — TROPONIN I (HIGH SENSITIVITY): Troponin I (High Sensitivity): 2 ng/L (ref <18)

## 2019-08-18 LAB — BASIC METABOLIC PANEL
Anion gap: 7 (ref 5–15)
BUN: 14 mg/dL (ref 6–20)
CO2: 25 mmol/L (ref 22–32)
Calcium: 9 mg/dL (ref 8.9–10.3)
Chloride: 105 mmol/L (ref 98–111)
Creatinine, Ser: 1.06 mg/dL (ref 0.61–1.24)
GFR calc Af Amer: >60 mL/min (ref >60)
GFR calc non Af Amer: >60 mL/min (ref >60)
Glucose, Bld: 109 mg/dL — ABNORMAL HIGH (ref 70–99)
Potassium: 3.7 mmol/L (ref 3.5–5.1)
Sodium: 137 mmol/L (ref 135–145)

## 2019-08-18 LAB — URINE DRUG SCREEN, QUALITATIVE (ARMC ONLY)
Amphetamines, Ur Screen: NOT DETECTED
Barbiturates, Ur Screen: NOT DETECTED
Benzodiazepine, Ur Scrn: NOT DETECTED
Cannabinoid 50 Ng, Ur ~~LOC~~: POSITIVE — AB
Cocaine Metabolite,Ur ~~LOC~~: NOT DETECTED
MDMA (Ecstasy)Ur Screen: NOT DETECTED
Methadone Scn, Ur: NOT DETECTED
Opiate, Ur Screen: NOT DETECTED
Phencyclidine (PCP) Ur S: NOT DETECTED
Tricyclic, Ur Screen: NOT DETECTED

## 2019-08-18 NOTE — ED Triage Notes (Signed)
Pt c/o left sided chest tightness that started today at 1900. Pt denies SOB and sts, "It feels like my chest is swelling up."

## 2019-08-19 ENCOUNTER — Telehealth: Payer: Self-pay | Admitting: Emergency Medicine

## 2019-08-19 ENCOUNTER — Emergency Department
Admission: EM | Admit: 2019-08-19 | Discharge: 2019-08-19 | Disposition: A | Discharge status: Home / Self Care | Payer: Self-pay | Attending: Emergency Medicine | Admitting: Emergency Medicine

## 2019-08-19 NOTE — Telephone Encounter (Signed)
Called patient due to lwot to inquire about condition and follow up plans. Says he is no better.  He does not have a doctor and was thinking to return here.  I told him he could go to urgent care or he can come back here anytime.

## 2019-08-19 NOTE — ED Notes (Signed)
Pt has not returned to ED lobby 

## 2019-08-20 ENCOUNTER — Emergency Department
Admission: EM | Admit: 2019-08-20 | Discharge: 2019-08-20 | Disposition: A | Discharge status: Home / Self Care | Payer: Self-pay | Attending: Emergency Medicine | Admitting: Emergency Medicine

## 2019-08-20 ENCOUNTER — Emergency Department: Payer: Self-pay

## 2019-08-20 ENCOUNTER — Other Ambulatory Visit: Payer: Self-pay

## 2019-08-20 ENCOUNTER — Encounter: Payer: Self-pay | Admitting: Emergency Medicine

## 2019-08-20 DIAGNOSIS — F17210 Nicotine dependence, cigarettes, uncomplicated: Secondary | ICD-10-CM | POA: Insufficient documentation

## 2019-08-20 DIAGNOSIS — R079 Chest pain, unspecified: Secondary | ICD-10-CM | POA: Insufficient documentation

## 2019-08-20 LAB — BASIC METABOLIC PANEL
Anion gap: 8 (ref 5–15)
BUN: 14 mg/dL (ref 6–20)
CO2: 24 mmol/L (ref 22–32)
Calcium: 9.4 mg/dL (ref 8.9–10.3)
Chloride: 105 mmol/L (ref 98–111)
Creatinine, Ser: 0.99 mg/dL (ref 0.61–1.24)
GFR calc Af Amer: >60 mL/min (ref >60)
GFR calc non Af Amer: >60 mL/min (ref >60)
Glucose, Bld: 103 mg/dL — ABNORMAL HIGH (ref 70–99)
Potassium: 4.1 mmol/L (ref 3.5–5.1)
Sodium: 137 mmol/L (ref 135–145)

## 2019-08-20 LAB — CBC
HCT: 45.7 % (ref 39.0–52.0)
Hemoglobin: 15.7 g/dL (ref 13.0–17.0)
MCH: 30.5 pg (ref 26.0–34.0)
MCHC: 34.4 g/dL (ref 30.0–36.0)
MCV: 88.7 fL (ref 80.0–100.0)
Platelets: 300 10*3/uL (ref 150–400)
RBC: 5.15 MIL/uL (ref 4.22–5.81)
RDW: 13.4 % (ref 11.5–15.5)
WBC: 7.4 10*3/uL (ref 4.0–10.5)
nRBC: 0 % (ref 0.0–0.2)

## 2019-08-20 LAB — TROPONIN I (HIGH SENSITIVITY): Troponin I (High Sensitivity): <2 ng/L (ref <18)

## 2019-08-20 MED ORDER — CYCLOBENZAPRINE HCL 10 MG PO TABS
10.0000 mg | ORAL_TABLET | Freq: Three times a day (TID) | ORAL | 0 refills | Status: DC | PRN
Start: 2019-08-20 — End: 2021-04-08

## 2019-08-20 MED ORDER — IBUPROFEN 600 MG PO TABS
600.0000 mg | ORAL_TABLET | Freq: Three times a day (TID) | ORAL | 0 refills | Status: DC | PRN
Start: 2019-08-20 — End: 2021-04-08

## 2019-08-20 NOTE — ED Triage Notes (Signed)
Pt here for left side chest pain constant for 2 days with left arm numbness. No other sx.  No cough or fever. VSS. Pain not improved or made worse by anything.

## 2019-08-20 NOTE — ED Provider Notes (Signed)
[facility] Emergency Department Provider Note       Time seen: ----------------------------------------- 8:42 AM on 08/20/2019 -----------------------------------------   I have reviewed the triage vital signs and the nursing notes.  HISTORY   Chief Complaint Chest Pain   HPI Roger [name] is a 29 y.o. male with a history of depression, substance abuse, bipolar disorder who presents to the ED for left-sided chest pain has been constant for 2 days with left arm numbness.  He has not had any other symptoms, denies cough or fever.  Pain is 6 out of 10 on the left side of his chest.  Past Medical History:  Diagnosis Date  . Depression   . Headache   . Substance abuse [facility])     Patient Active Problem List   Diagnosis Date Noted  . Paranoia (HCC) 07/16/2019  . Bipolar affective disorder, mixed, severe, with psychotic behavior (HCC) 07/16/2019  . Herpes 01/25/2018  . Tobacco use disorder 11/25/2014  . Alcohol use disorder, severe, dependence (HCC) 11/25/2014  . Cannabis use disorder, severe, dependence (HCC) 11/25/2014  . 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 Opioid use disorder, moderate, dependence (HCC) 11/25/2014  . 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 Severe recurrent major depression without psychotic features (HCC) 11/24/2014    Past Surgical History:  Procedure Laterality Date  . FEMUR FRACTURE SURGERY      Allergies Haldol [haloperidol lactate]  Social History Social History   Tobacco Use  . Smoking status: Current Every Day Smoker    Packs/day: 0.50    Years: 10.00    Pack years: 5.00    Types: Cigarettes  . Smokeless tobacco: Former Neurosurgeon    Quit date: 11/24/2014  Substance Use Topics  . Alcohol use: Yes    Alcohol/week: 10.0 standard drinks    Types: 3 Cans of beer, 7 Shots of liquor per week  . Drug use: Yes    Types:  Amphetamines, Cocaine, Marijuana, Benzodiazepines, MDMA (Ecstacy), Hydrocodone    Review of Systems Constitutional: Negative for fever. Cardiovascular: Positive for chest pain Respiratory: Negative for shortness of breath. Gastrointestinal: Negative for abdominal pain, vomiting and diarrhea. Musculoskeletal: Negative for back pain. Skin: Negative for rash. Neurological: Negative for headaches, focal weakness or numbness.  All systems negative/normal/unremarkable except as stated in the HPI  ____________________________________________   PHYSICAL EXAM:  VITAL SIGNS: ED Triage Vitals [08/20/19 0826]  Enc Vitals Group     BP 128/78     Pulse Rate 82     Resp 16     Temp 98.4 F (36.9 C)     Temp Source Oral     SpO2 98 %     Weight 135 lb (61.2 kg)     Height 5\' 6"  (1.676 m)     Head Circumference      Peak Flow      Pain Score 6     Pain Loc      Pain Edu?      Excl. in GC?     Constitutional: Alert and oriented. Well appearing and in no distress. Eyes: Conjunctivae are normal. Normal extraocular movements. Cardiovascular: Normal rate, regular rhythm. No murmurs, rubs, or gallops. Respiratory: Normal respiratory effort without tachypnea nor retractions. Breath sounds are clear and equal bilaterally. No wheezes/rales/rhonchi. Gastrointestinal: Soft and nontender. Normal bowel sounds Musculoskeletal: Nontender with normal range of motion in extremities. No lower extremity tenderness nor edema. Neurologic:  Normal  speech and language. No gross focal neurologic deficits are appreciated.  Skin:  Skin is warm, dry and intact. No rash noted. Psychiatric: Mood and affect are normal. Speech and behavior are normal.  ____________________________________________  EKG: Interpreted by me.  Sinus rhythm with sinus arrhythmia, rate is 74 bpm, normal QT  ____________________________________________  ED COURSE:  As part of my medical decision making, I reviewed the following data  within the [place] History obtained from family if available, nursing notes, old chart and ekg, as well as notes from prior ED visits. Patient presented for chest pain, we will assess with labs and imaging as indicated at this time.   Procedures  [name] was evaluated in Emergency Department on 08/20/2019 for the symptoms described in the history of present illness. He was evaluated in the context of the global COVID-19 pandemic, which necessitated consideration that the patient might be at risk for infection with the SARS-CoV-2 virus that causes COVID-19. Institutional protocols and algorithms that pertain to the evaluation of patients at risk for COVID-19 are in a state of rapid change based on information released by regulatory bodies including the CDC and federal and state organizations. These policies and algorithms were followed during the patient's care in the ED.  ____________________________________________   LABS (pertinent positives/negatives)  Labs Reviewed  BASIC METABOLIC PANEL - Abnormal; Notable for the following components:      Result Value   Glucose, Bld 103 (*)    All other components within normal limits  CBC  TROPONIN I (HIGH SENSITIVITY)    RADIOLOGY Images were viewed by me  Chest x-ray IMPRESSION: No active cardiopulmonary disease. ____________________________________________   DIFFERENTIAL DIAGNOSIS   Musculoskeletal pain, GERD, anxiety, unstable angina, MI, substance abuse  FINAL ASSESSMENT AND PLAN  Chest pain   Plan: The patient had presented for chest pain. Patient's labs are unremarkable. Patient's imaging did not reveal any acute process.  Pain is likely noncardiac in origin.  He is cleared for outpatient follow-up.   Laurence Aly, MD    Note: This note was generated in part or whole with voice recognition software. Voice recognition is usually quite accurate but there are transcription errors that  can and very often do occur. I apologize for any typographical errors that were not detected and corrected.     Earleen Newport, MD 08/20/19 [phone]

## 2019-08-20 NOTE — ED Notes (Signed)
Pt taken to radiology at this time.

## 2019-08-22 ENCOUNTER — Ambulatory Visit: Payer: Medicaid Other | Admitting: Pharmacy Technician

## 2019-08-22 ENCOUNTER — Other Ambulatory Visit: Payer: Self-pay

## 2019-08-22 DIAGNOSIS — Z79899 Other long term (current) drug therapy: Secondary | ICD-10-CM

## 2019-08-22 NOTE — Progress Notes (Signed)
Completed financial assistance application for [facility] due to recent hospital visit. Patient agreed to be responsible for gathering financial information and forwarding to appropriate department in .    Completed [facility] application and contract.  Patient agreed to all terms of the [facility] contract.    Patient approved to receive medication assistance at [facility] until time for re-certification in 2022, and as long as eligibility criteria continues to be met.    Provided patient with community resource material based on his particular needs.    Roger [name] Care Manager [facility]  

## 2020-05-12 ENCOUNTER — Other Ambulatory Visit: Payer: Self-pay

## 2020-05-12 ENCOUNTER — Encounter: Payer: Self-pay | Admitting: Emergency Medicine

## 2020-05-12 ENCOUNTER — Emergency Department
Admission: EM | Admit: 2020-05-12 | Discharge: 2020-05-13 | Payer: 59 | Attending: Emergency Medicine | Admitting: Emergency Medicine

## 2020-05-12 DIAGNOSIS — F1220 Cannabis dependence, uncomplicated: Secondary | ICD-10-CM | POA: Diagnosis present

## 2020-05-12 DIAGNOSIS — Z20822 Contact with and (suspected) exposure to covid-19: Secondary | ICD-10-CM | POA: Insufficient documentation

## 2020-05-12 DIAGNOSIS — F3164 Bipolar disorder, current episode mixed, severe, with psychotic features: Secondary | ICD-10-CM

## 2020-05-12 DIAGNOSIS — F17210 Nicotine dependence, cigarettes, uncomplicated: Secondary | ICD-10-CM | POA: Diagnosis not present

## 2020-05-12 DIAGNOSIS — Z046 Encounter for general psychiatric examination, requested by authority: Secondary | ICD-10-CM | POA: Diagnosis present

## 2020-05-12 DIAGNOSIS — F312 Bipolar disorder, current episode manic severe with psychotic features: Secondary | ICD-10-CM | POA: Insufficient documentation

## 2020-05-12 DIAGNOSIS — F3130 Bipolar disorder, current episode depressed, mild or moderate severity, unspecified: Secondary | ICD-10-CM

## 2020-05-12 LAB — COMPREHENSIVE METABOLIC PANEL
ALT: 27 U/L (ref 0–44)
AST: 30 U/L (ref 15–41)
Albumin: 4.6 g/dL (ref 3.5–5.0)
Alkaline Phosphatase: 57 U/L (ref 38–126)
Anion gap: 11 (ref 5–15)
BUN: 11 mg/dL (ref 6–20)
CO2: 25 mmol/L (ref 22–32)
Calcium: 9.6 mg/dL (ref 8.9–10.3)
Chloride: 100 mmol/L (ref 98–111)
Creatinine, Ser: 1.04 mg/dL (ref 0.61–1.24)
GFR, Estimated: >60 mL/min (ref >60)
Glucose, Bld: 117 mg/dL — ABNORMAL HIGH (ref 70–99)
Potassium: 4.1 mmol/L (ref 3.5–5.1)
Sodium: 136 mmol/L (ref 135–145)
Total Bilirubin: 0.8 mg/dL (ref 0.3–1.2)
Total Protein: 7.8 g/dL (ref 6.5–8.1)

## 2020-05-12 LAB — CBC
HCT: 46.2 % (ref 39.0–52.0)
Hemoglobin: 16.2 g/dL (ref 13.0–17.0)
MCH: 30.8 pg (ref 26.0–34.0)
MCHC: 35.1 g/dL (ref 30.0–36.0)
MCV: 87.8 fL (ref 80.0–100.0)
Platelets: 329 10*3/uL (ref 150–400)
RBC: 5.26 MIL/uL (ref 4.22–5.81)
RDW: 13.3 % (ref 11.5–15.5)
WBC: 9 10*3/uL (ref 4.0–10.5)
nRBC: 0 % (ref 0.0–0.2)

## 2020-05-12 LAB — RESP PANEL BY RT-PCR (FLU A&B, COVID) ARPGX2
Influenza A by PCR: NEGATIVE
Influenza B by PCR: NEGATIVE
SARS Coronavirus 2 by RT PCR: NEGATIVE

## 2020-05-12 LAB — TSH: TSH: 1.524 u[IU]/mL (ref 0.350–4.500)

## 2020-05-12 LAB — URINE DRUG SCREEN, QUALITATIVE (ARMC ONLY)
Amphetamines, Ur Screen: NOT DETECTED
Barbiturates, Ur Screen: NOT DETECTED
Benzodiazepine, Ur Scrn: NOT DETECTED
Cannabinoid 50 Ng, Ur ~~LOC~~: POSITIVE — AB
Cocaine Metabolite,Ur ~~LOC~~: NOT DETECTED
MDMA (Ecstasy)Ur Screen: NOT DETECTED
Methadone Scn, Ur: NOT DETECTED
Opiate, Ur Screen: NOT DETECTED
Phencyclidine (PCP) Ur S: NOT DETECTED
Tricyclic, Ur Screen: NOT DETECTED

## 2020-05-12 LAB — SALICYLATE LEVEL: Salicylate Lvl: <7 mg/dL — ABNORMAL LOW (ref 7.0–30.0)

## 2020-05-12 LAB — ACETAMINOPHEN LEVEL: Acetaminophen (Tylenol), Serum: <10 ug/mL — ABNORMAL LOW (ref 10–30)

## 2020-05-12 LAB — LIPID PANEL
Cholesterol: 150 mg/dL (ref 0–200)
HDL: 58 mg/dL (ref >40)
LDL Cholesterol: 81 mg/dL (ref 0–99)
Total CHOL/HDL Ratio: 2.6 RATIO
Triglycerides: 53 mg/dL (ref <150)
VLDL: 11 mg/dL (ref 0–40)

## 2020-05-12 LAB — ETHANOL: Alcohol, Ethyl (B): <10 mg/dL (ref <10)

## 2020-05-12 LAB — HEMOGLOBIN A1C
Hgb A1c MFr Bld: 5.8 % — ABNORMAL HIGH (ref 4.8–5.6)
Mean Plasma Glucose: 119.76 mg/dL

## 2020-05-12 MED ORDER — PALIPERIDONE ER 6 MG PO TB24
9.0000 mg | ORAL_TABLET | Freq: Every day | ORAL | Status: DC
  Filled 2020-05-12: qty 1

## 2020-05-12 MED ORDER — PALIPERIDONE ER 3 MG PO TB24
9.0000 mg | ORAL_TABLET | Freq: Every day | ORAL | Status: DC
  Administered 2020-05-12: 9 mg via ORAL
  Filled 2020-05-12 (×2): qty 3

## 2020-05-12 NOTE — BH Assessment (Addendum)
Pt was referred out for inpatient placement by [facility] prior to arrival.   This Clinical research associate faxed pt's updated vital signs to [facility] [phone] or [phone]) as requested. Tobi Bastos agreed to follow up on pt's acceptance upon receiving lab work.

## 2020-05-12 NOTE — Consult Note (Signed)
[facility] Face-to-Face Psychiatry Consult   Reason for Consult: Consult for this 30 year old man with bipolar disorder brought in under IVC by police Referring Physician: Su Hoff Patient Identification: Roger [name] MRN:  [id number] Principal Diagnosis: Bipolar affective disorder, mixed, severe, with psychotic behavior (HCC) Diagnosis:  Principal Problem:   Bipolar affective disorder, mixed, severe, with psychotic behavior (HCC) Active Problems:   Cannabis use disorder, severe, dependence (HCC)   Total Time spent with patient: 1 hour  Subjective:   Roger [name] is a 30 y.o. male patient admitted with "if it is my time I am good with that".  HPI: Patient seen chart reviewed.  Paperwork reviewed.  30 year old man with a well known long history of bipolar disorder brought in under involuntary commitment filed through [employer].  Patient's grandmother has been increasingly alarmed about his behavior recently.  Apparently 3 weeks ago he went through a severe manic phase and gambled away a large amount of money.  Now he has crashed into what seems more like a depression.  He made some statements to his grandmother that she took as suicidal although on interview today he denies that he was actually planning to kill himself only that he was communicating to her that if it is his time to die he is good with that.  Patient says he is feeling very good but at the same time feels empty inside like there is nothing inside him.  He has not been sleeping for days.  Patient has not been on his psychiatric medicine since this past summer.  Ongoing use of marijuana denies other drug use.  Past Psychiatric History: Long history of bipolar disorder going back many years.  Multiple hospitalizations.  Several prior suicide attempts.  Last hospitalization here last year.  He is intelligent and has some insight but is repeatedly noncompliant with medication outside the hospital.  He has exhibited both manic and  depressed psychotic phases.  Risk to Self:   Risk to Others:   Prior Inpatient Therapy:   Prior Outpatient Therapy:    Past Medical History:  Past Medical History:  Diagnosis Date  . Depression   . Headache   . Substance abuse [facility])     Past Surgical History:  Procedure Laterality Date  . FEMUR FRACTURE SURGERY     Family History:  Family History  Problem Relation Age of Onset  . Alcohol abuse Father    Family Psychiatric  History: Father with alcohol use Social History:  Social History   Substance and Sexual Activity  Alcohol Use Yes  . Alcohol/week: 10.0 standard drinks  . Types: 3 Cans of beer, 7 Shots of liquor per week     Social History   Substance and Sexual Activity  Drug Use Yes  . Types: Amphetamines, Cocaine, Marijuana, Benzodiazepines, MDMA (Ecstacy), Hydrocodone    Social History   Socioeconomic History  . Marital status: Single    Spouse name: Not on file  . Number of children: Not on file  . Years of education: Not on file  . Highest education level: Not on file  Occupational History  . Not on file  Tobacco Use  . Smoking status: Current Every Day Smoker    Packs/day: 0.50    Years: 10.00    Pack years: 5.00    Types: Cigarettes  . Smokeless tobacco: Former Neurosurgeon    Quit date: 11/24/2014  Vaping Use  . Vaping Use: Never used  Substance and Sexual Activity  . Alcohol use:  Yes    Alcohol/week: 10.0 standard drinks    Types: 3 Cans of beer, 7 Shots of liquor per week  . Drug use: Yes    Types: Amphetamines, Cocaine, Marijuana, Benzodiazepines, MDMA (Ecstacy), Hydrocodone  . Sexual activity: Yes    Birth control/protection: None  Other Topics Concern  . Not on file  Social History Narrative  . Not on file   Social Determinants of Health   Financial Resource Strain: Not on file  Food Insecurity: Not on file  Transportation Needs: Not on file  Physical Activity: Not on file  Stress: Not on file  Social Connections: Not on file    Additional Social History:    Allergies:   Allergies  Allergen Reactions  . Haldol [Haloperidol Lactate]     EPS sxs    Labs:  Results for orders placed or performed during the hospital encounter of 05/12/20 (from the past 48 hour(s))  Comprehensive metabolic panel     Status: Abnormal   Collection Time: 05/12/20  5:05 PM  Result Value Ref Range   Sodium 136 135 - 145 mmol/L   Potassium 4.1 3.5 - 5.1 mmol/L   Chloride 100 98 - 111 mmol/L   CO2 25 22 - 32 mmol/L   Glucose, Bld 117 (H) 70 - 99 mg/dL    Comment: Glucose reference range applies only to samples taken after fasting for at least 8 hours.   BUN 11 6 - 20 mg/dL   Creatinine, Ser 7.67 0.61 - 1.24 mg/dL   Calcium 9.6 8.9 - 34.1 mg/dL   Total Protein 7.8 6.5 - 8.1 g/dL   Albumin 4.6 3.5 - 5.0 g/dL   AST 30 15 - 41 U/L   ALT 27 0 - 44 U/L   Alkaline Phosphatase 57 38 - 126 U/L   Total Bilirubin 0.8 0.3 - 1.2 mg/dL   GFR, Estimated >93 >79 mL/min    Comment: (NOTE) Calculated using the CKD-EPI Creatinine Equation (2021)    Anion gap 11 5 - 15    Comment: Performed at [facility], [address] [place], [place] [address]  cbc     Status: None   Collection Time: 05/12/20  5:05 PM  Result Value Ref Range   WBC 9.0 4.0 - 10.5 K/uL   RBC 5.26 4.22 - 5.81 MIL/uL   Hemoglobin 16.2 13.0 - 17.0 g/dL   HCT 73.5 32.9 - 92.4 %   MCV 87.8 80.0 - 100.0 fL   MCH 30.8 26.0 - 34.0 pg   MCHC 35.1 30.0 - 36.0 g/dL   RDW 26.8 34.1 - 96.2 %   Platelets 329 150 - 400 K/uL   nRBC 0.0 0.0 - 0.2 %    Comment: Performed at [facility], [address], [place], [place] [address]    Current Facility-Administered Medications  Medication Dose Route Frequency Provider Last Rate Last Admin  . paliperidone (INVEGA) 24 hr tablet 9 mg  9 mg Oral QHS Roger [name], Jackquline Denmark, MD       Current Outpatient Medications  Medication Sig Dispense Refill  . cyclobenzaprine (FLEXERIL) 10 MG tablet Take 1 tablet (10 mg total) by  mouth 3 (three) times daily as needed for muscle spasms. 30 tablet 0  . ibuprofen (ADVIL) 600 MG tablet Take 1 tablet (600 mg total) by mouth every 8 (eight) hours as needed. 30 tablet 0  . paliperidone (INVEGA SUSTENNA) 234 MG/1.5ML SUSY injection Inject 234 mg into the muscle every 28 (twenty-eight) days. 1.8  mL 1  . paliperidone (INVEGA) 6 MG 24 hr tablet Take 1 tablet (6 mg total) by mouth daily. 30 tablet 1  . temazepam (RESTORIL) 15 MG capsule Take 1 capsule (15 mg total) by mouth at bedtime as needed for sleep. 30 capsule 0    Musculoskeletal: Strength & Muscle Tone: within normal limits Gait & Station: normal Patient leans: N/A  Psychiatric Specialty Exam: Physical Exam Vitals and nursing note reviewed.  Constitutional:      Appearance: He is well-developed and well-nourished.  HENT:     Head: Normocephalic and atraumatic.  Eyes:     Conjunctiva/sclera: Conjunctivae normal.     Pupils: Pupils are equal, round, and reactive to light.  Cardiovascular:     Heart sounds: Normal heart sounds.  Pulmonary:     Effort: Pulmonary effort is normal.  Abdominal:     Palpations: Abdomen is soft.  Musculoskeletal:        General: Normal range of motion.     Cervical back: Normal range of motion.  Skin:    General: Skin is warm and dry.  Neurological:     General: No focal deficit present.     Mental Status: He is alert.  Psychiatric:        Attention and Perception: Attention normal.        Mood and Affect: Affect is blunt and inappropriate.        Speech: Speech is tangential.        Behavior: Behavior is withdrawn. Behavior is not agitated or aggressive.        Thought Content: Thought content is paranoid. Thought content includes suicidal ideation. Thought content does not include suicidal plan.        Cognition and Memory: Cognition is impaired.        Judgment: Judgment is inappropriate.     Review of Systems  Constitutional: Negative.   HENT: Negative.   Eyes:  Negative.   Respiratory: Negative.   Cardiovascular: Negative.   Gastrointestinal: Negative.   Musculoskeletal: Negative.   Skin: Negative.   Neurological: Negative.   Psychiatric/Behavioral: Positive for dysphoric mood, sleep disturbance and suicidal ideas. The patient is nervous/anxious.     Blood pressure (!) 144/107, pulse (!) 114, temperature 98.3 F (36.8 C), temperature source Oral, resp. rate 20, height 5\' 6"  (1.676 m), weight 61.2 kg, SpO2 100 %.Body mass index is 21.78 kg/m.  General Appearance: Fairly Groomed  Eye Contact:  Fair  Speech:  Clear and Coherent  Volume:  Decreased  Mood:  Euthymic  Affect:  Inappropriate, Tearful and Patient has tears in his eyes looks like he has been agitated recently  Thought Process:  Disorganized  Orientation:  Full (Time, Place, and Person)  Thought Content:  Illogical, Ideas of Reference:   Paranoia, Rumination and Tangential  Suicidal Thoughts:  Yes.  without intent/plan  Homicidal Thoughts:  No  Memory:  Immediate;   Fair Recent;   Fair Remote;   Fair  Judgement:  Impaired  Insight:  Shallow  Psychomotor Activity:  Decreased  Concentration:  Concentration: Poor  Recall:  Fair  Fund of Knowledge:  Good  Language:  Good  Akathisia:  No  Handed:  Right  AIMS (if indicated):     Assets:  Housing Physical Health Resilience  ADL's:  Impaired  Cognition:  WNL  Sleep:        Treatment Plan Summary: Daily contact with patient to assess and evaluate symptoms and progress in treatment, Medication management and Plan  This is a 30 year old man with bipolar disorder presenting with what seems to probably be a mixed episode with subjective euphoria accompanied by symptoms of depression at the same time and death related thoughts.  Coming off of what sounds like an agitated mania recently.  Very dangerous history of suicide attempts in the past.  Patient admits that he has a illness but resists taking medication.  Orders placed to  restart Invega which we were using last time he was here in order to get him on long-acting injectable.  Continue IVC.  Labs still pending but plan will be for inpatient hospitalization.  Disposition: Recommend psychiatric Inpatient admission when medically cleared.  Mordecai Rasmussen, MD 05/12/2020 5:53 PM

## 2020-05-12 NOTE — ED Notes (Signed)
Pt on phone 

## 2020-05-12 NOTE — ED Notes (Signed)
Pt states he feels numb to everything and reports that he has gotten himself into something that he does not know how he can get out of. Pt does not speak on what this situation is.

## 2020-05-12 NOTE — ED Notes (Signed)
Hourly rounding completed at this time, patient currently awake in room. No complaints, stable, and in no acute distress. Q15 minute rounds and monitoring via Rover and Officer to continue. °

## 2020-05-12 NOTE — ED Triage Notes (Signed)
Pt in via [employer] under IVC from [facility]. Pt refusing to answer questions and stares straight ahead

## 2020-05-12 NOTE — ED Notes (Signed)

## 2020-05-12 NOTE — ED Notes (Addendum)
Two [employer] officer assisted pt into [facility] scrubs.  Pt belogings include: Black jeans, black long sleeves shirt, brown boots, black belt, black sweater, underwear, and socks.  2 of 2 bags

## 2020-05-12 NOTE — ED Notes (Signed)
Pt. To BHU from ED ambulatory without difficulty, to room  BHU 5. Report from Chris RN. Pt. Is alert and oriented, warm and dry in no distress. Pt. Denies SI, HI, and AVH. Pt. Calm and cooperative. Pt. Made aware of security cameras and Q15 minute rounds. Pt. Encouraged to let Nursing staff know of any concerns or needs.   

## 2020-05-12 NOTE — ED Provider Notes (Signed)
[facility] Emergency Department Provider Note   ____________________________________________   Event Date/Time   First MD Initiated Contact with Patient 05/12/20 1720     (approximate)  I have reviewed the triage vital signs and the nursing notes.   HISTORY  Chief Complaint Mental Health Problem    HPI Roger [name] is a 30 y.o. male with past medical history of bipolar disorder and polysubstance abuse who presents to the ED for psychiatric evaluation.  Patient states that he "feels empty inside" and left his grandmother in note a couple of days ago describing his feelings.  Per IVC paperwork, patient had written a note that stated he wanted to kill himself and he reportedly has access to weapons in the home.  He was initially brought to [facility] for evaluation and when informed he would be placed under IVC, became agitated and aggressive.  He attempted to gain control of an officers weapon but was taken to the ground and placed in handcuffs.  He is now more calm and cooperative, denies any suicidal or homicidal ideation, states only that he "feels empty" and like something is wrong with him.  He denies any medical complaints or recent substance abuse.        Past Medical History:  Diagnosis Date  . Depression   . Headache   . Substance abuse [facility])     Patient Active Problem List   Diagnosis Date Noted  . Paranoia (HCC) 07/16/2019  . Bipolar affective disorder, mixed, severe, with psychotic behavior (HCC) 07/16/2019  . Herpes 01/25/2018  . Tobacco use disorder 11/25/2014  . Alcohol use disorder, severe, dependence (HCC) 11/25/2014  . Cannabis use disorder, severe, dependence (HCC) 11/25/2014  . 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 Opioid use disorder, moderate, dependence (HCC) 11/25/2014  . 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 Severe recurrent  major depression without psychotic features (HCC) 11/24/2014    Past Surgical History:  Procedure Laterality Date  . FEMUR FRACTURE SURGERY      Prior to Admission medications   Medication Sig Start Date End Date Taking? Authorizing Provider  cyclobenzaprine (FLEXERIL) 10 MG tablet Take 1 tablet (10 mg total) by mouth 3 (three) times daily as needed for muscle spasms. 08/20/19   Emily Filbert, MD  ibuprofen (ADVIL) 600 MG tablet Take 1 tablet (600 mg total) by mouth every 8 (eight) hours as needed. 08/20/19   Emily Filbert, MD  paliperidone (INVEGA SUSTENNA) 234 MG/1.5ML SUSY injection Inject 234 mg into the muscle every 28 (twenty-eight) days. 08/14/19   Money, Gerlene Burdock, FNP  paliperidone (INVEGA) 6 MG 24 hr tablet Take 1 tablet (6 mg total) by mouth daily. 07/19/19   Money, Gerlene Burdock, FNP  temazepam (RESTORIL) 15 MG capsule Take 1 capsule (15 mg total) by mouth at bedtime as needed for sleep. 07/18/19   Money, Gerlene Burdock, FNP    Allergies Haldol [haloperidol lactate]  Family History  Problem Relation Age of Onset  . Alcohol abuse Father     Social History Social History   Tobacco Use  . Smoking status: Current Every Day Smoker    Packs/day: 0.50    Years: 10.00    Pack years: 5.00    Types: Cigarettes  . Smokeless tobacco: Former Neurosurgeon    Quit date: 11/24/2014  Vaping Use  . Vaping Use: Never used  Substance Use Topics  . Alcohol use: Yes  Alcohol/week: 10.0 standard drinks    Types: 3 Cans of beer, 7 Shots of liquor per week  . Drug use: Yes    Types: Amphetamines, Cocaine, Marijuana, Benzodiazepines, MDMA (Ecstacy), Hydrocodone    Review of Systems  Constitutional: No fever/chills Eyes: No visual changes. ENT: No sore throat. Cardiovascular: Denies chest pain. Respiratory: Denies shortness of breath. Gastrointestinal: No abdominal pain.  No nausea, no vomiting.  No diarrhea.  No constipation. Genitourinary: Negative for dysuria. Musculoskeletal: Negative  for back pain. Skin: Negative for rash. Neurological: Negative for headaches, focal weakness or numbness.  Positive for depression.  ____________________________________________   PHYSICAL EXAM:  VITAL SIGNS: ED Triage Vitals  Enc Vitals Group     BP 05/12/20 1657 (!) 144/107     Pulse Rate 05/12/20 1657 (!) 114     Resp 05/12/20 1657 20     Temp 05/12/20 1657 98.3 F (36.8 C)     Temp Source 05/12/20 1657 Oral     SpO2 05/12/20 1657 100 %     Weight 05/12/20 1640 134 lb 14.7 oz (61.2 kg)     Height 05/12/20 1640 5\' 6"  (1.676 m)     Head Circumference --      Peak Flow --      Pain Score 05/12/20 1640 0     Pain Loc --      Pain Edu? --      Excl. in GC? --     Constitutional: Alert and oriented. Eyes: Conjunctivae are normal. Head: Atraumatic. Nose: No congestion/rhinnorhea. Mouth/Throat: Mucous membranes are moist. Neck: Normal ROM Cardiovascular: Normal rate, regular rhythm. Grossly normal heart sounds. Respiratory: Normal respiratory effort.  No retractions. Lungs CTAB. Gastrointestinal: Soft and nontender. No distention. Genitourinary: deferred Musculoskeletal: No lower extremity tenderness nor edema. Neurologic:  Normal speech and language. No gross focal neurologic deficits are appreciated. Skin:  Skin is warm, dry and intact. No rash noted. Psychiatric: Mood and affect are normal. Speech and behavior are normal.  ____________________________________________   LABS (all labs ordered are listed, but only abnormal results are displayed)  Labs Reviewed  COMPREHENSIVE METABOLIC PANEL - Abnormal; Notable for the following components:      Result Value   Glucose, Bld 117 (*)    All other components within normal limits  SALICYLATE LEVEL - Abnormal; Notable for the following components:   Salicylate Lvl <7.0 (*)    All other components within normal limits  ACETAMINOPHEN LEVEL - Abnormal; Notable for the following components:   Acetaminophen (Tylenol), Serum  <10 (*)    All other components within normal limits  RESP PANEL BY RT-PCR (FLU A&B, COVID) ARPGX2  ETHANOL  CBC  URINE DRUG SCREEN, QUALITATIVE ([facility] ONLY)  LIPID PANEL  TSH  HEMOGLOBIN A1C    PROCEDURES  Procedure(s) performed (including Critical Care):  Procedures   ____________________________________________   INITIAL IMPRESSION / ASSESSMENT AND PLAN / ED COURSE       30 year old male with past medical history of bipolar disorder and polysubstance abuse who presents to the ED for feelings of emptiness and depression, reportedly wrote a suicide note a couple of days ago.  He also became aggressive with police and was reaching for their weapon.  He was placed under IVC but is now much more calm and cooperative.  He denies any medical complaints and screening labs are unremarkable.  Patient was evaluated by psychiatry, who will plan for psychiatric admission.  The patient has been placed in psychiatric observation due to the need  to provide a safe environment for the patient while obtaining psychiatric consultation and evaluation, as well as ongoing medical and medication management to treat the patient's condition.  The patient has been placed under full IVC at this time.       ____________________________________________   FINAL CLINICAL IMPRESSION(S) / ED DIAGNOSES  Final diagnoses:  Bipolar affective disorder, current episode depressed, current episode severity unspecified [facility])     ED Discharge Orders    None       Note:  This document was prepared using Dragon voice recognition software and may include unintentional dictation errors.   Chesley Noon, MD 05/12/20 1806

## 2020-05-12 NOTE — BH Assessment (Signed)
Comprehensive Clinical Assessment (CCA) Note  05/12/2020 Roger [name] [id number]  Chief Complaint:  Chief Complaint  Patient presents with  . Mental Health Problem   Visit Diagnosis: Bipolar affective disorder disorder mixed, severe, with psychotic behavior  Roger [name] is a 30 year old patient who presented to [facility] ED involuntarily from [facility] due to exhibiting bizarre behaviors. According to the triage note, "Pt in via [facility] under IVC from [facility]. Pt refusing to answer questions and stares straight ahead. Upon interview, pt. had a euthymic mood and affect. Pt was noted to laugh inappropriately throughout the interview. Pt has circumstantial speech, making comments such as "I took a dive in the wrong pool; the relationship got too hot." Pt admitted to regular cannabis use. Pt denies symptoms of depression however he reports feelings of numbness and a poor appetite. Pt reported that he'd not eaten since Saturday. Pt admitted to increased irritability, reporting that he quit his job a week ago due to frequent conflict with his coworkers. Pt denies current SI/plan however he expresses passive SI. Pt repetitively made statements as follows: "I'm tired of fighting; I had my chance" and I'm at peace; not worried about much I said my goodbyes." Pt denies HI, AV/H, NSSIB, or access to guns/weapons. Pt has an extensive history with mental health issues but reports that he is not connected to a psychiatrist, therapist, and is not on any medications to treat his disorders. Pt's protective factors include a supportive family; pt currently resides with his grandmother. Pt. reported that his main stressor/problem is finances and chronic feelings of emptiness.  Pt is oriented x5. Pt's recent and remote memory is intact. Pt was communicative but preoccupied with thoughts of being ready to die.   Recommendations for Services/Supports/Treatments: Disposition: Per Dr. Toni Amend, pt. is recommended for inpatient  treatment    CCA Screening, Triage and Referral (STR)  Patient Reported Information How did you hear about [place]? Family/Friend  Referral name: Sister  Referral phone number: No data recorded  Whom do you see for routine medical problems? I don't have a doctor  Practice/Facility Name: No data recorded Practice/Facility Phone Number: No data recorded Name of Contact: No data recorded Contact Number: No data recorded Contact Fax Number: No data recorded Prescriber Name: No data recorded Prescriber Address (if known): No data recorded  What Is the Reason for Your Visit/Call Today? Pt has been exhibiting bizarre behavior; sleep disturbance  How Long Has This Been Causing You Problems? > than 6 months  What Do You Feel Would Help You the Most Today? -- (Pt unable to verbalize services needed.)   Have You Recently Been in Any Inpatient Treatment (Hospital/Detox/Crisis Center/28-Day Program)? No  Name/Location of Program/Hospital:No data recorded How Long Were You There? No data recorded When Were You Discharged? No data recorded  Have You Ever Received Services From [facility] Before? Yes  Who Do You See at [facility]? No data recorded  Have You Recently Had Any Thoughts About Hurting Yourself? No (Pt makes statements about being tired of fighting; denies SI)  Are You Planning to Commit Suicide/Harm Yourself At This time? No   Have you Recently Had Thoughts About Hurting Someone Karolee Ohs? No  Explanation: No data recorded  Have You Used Any Alcohol or Drugs in the Past 24 Hours? No  How Long Ago Did You Use Drugs or Alcohol? No data recorded What Did You Use and How Much? No data recorded  Do You Currently Have a Therapist/Psychiatrist? No  Name of  Therapist/Psychiatrist: No data recorded  Have You Been Recently Discharged From Any Office Practice or Programs? No  Explanation of Discharge From Practice/Program: No data recorded    CCA Screening Triage Referral  Assessment Type of Contact: Face-to-Face  Is this Initial or Reassessment? No data recorded Date Telepsych consult ordered in [facility]:  No data recorded Time Telepsych consult ordered in [facility]:  No data recorded  Patient Reported Information Reviewed? Yes  Patient Left Without Being Seen? No data recorded Reason for Not Completing Assessment: No data recorded  Collateral Involvement: No data recorded  Does Patient Have a Court Appointed Legal Guardian? No data recorded Name and Contact of Legal Guardian: Self  If Minor and Not Living with Parent(s), Who has Custody? No data recorded Is CPS involved or ever been involved? Never  Is APS involved or ever been involved? Never   Patient Determined To Be At Risk for Harm To Self or Others Based on Review of Patient Reported Information or Presenting Complaint? Yes, for Self-Harm  Method: No data recorded Availability of Means: No data recorded Intent: No data recorded Notification Required: No data recorded Additional Information for Danger to Others Potential: No data recorded Additional Comments for Danger to Others Potential: No data recorded Are There Guns or Other Weapons in Your Home? No data recorded Types of Guns/Weapons: No data recorded Are These Weapons Safely Secured?                            No data recorded Who Could Verify You Are Able To Have These Secured: No data recorded Do You Have any Outstanding Charges, Pending Court Dates, Parole/Probation? No data recorded Contacted To Inform of Risk of Harm To Self or Others: No data recorded  Location of Assessment: [facility]   Does Patient Present under Involuntary Commitment? Yes  IVC Papers Initial File Date: 05/12/2020   [place] of Residence: [place]   Patient Currently Receiving the Following Services: Not Receiving Services   Determination of Need: Urgent (48 hours)   Options For Referral: Inpatient Hospitalization     CCA Biopsychosocial Intake/Chief  Complaint:  Bizarre behavior; medication non compliance  Current Symptoms/Problems: Feelings of numbness; poor appetited; sleep disturbance   Patient Reported Schizophrenia/Schizoaffective Diagnosis in Past: No   Strengths: Patient able to articulate his thoughts effectively  Preferences: None noted  Abilities: Effective communication   Type of Services Patient Feels are Needed: None noted   Initial Clinical Notes/Concerns: Patient laughing inappropriately throughout the assessment.   Mental Health Symptoms Depression:  Irritability; Weight gain/loss; Hopelessness   Duration of Depressive symptoms: Greater than two weeks   Mania:  Irritability; Euphoria   Anxiety:   None   Psychosis:  None   Duration of Psychotic symptoms: No data recorded  Trauma:  None   Obsessions:  None   Compulsions:  None   Inattention:  None   Hyperactivity/Impulsivity:  N/A   Oppositional/Defiant Behaviors:  None   Emotional Irregularity:  Chronic feelings of emptiness; Mood lability   Other Mood/Personality Symptoms:  No data recorded   Mental Status Exam Appearance and self-care  Stature:  Small   Weight:  Thin   Clothing:  Casual; Age-appropriate   Grooming:  Normal   Cosmetic use:  None   Posture/gait:  Normal   Motor activity:  Not Remarkable   Sensorium  Attention:  Normal   Concentration:  Normal   Orientation:  Object; Person; Place; Situation; Time; X5  Recall/memory:  Normal   Affect and Mood  Affect:  Full Range   Mood:  Euthymic   Relating  Eye contact:  Normal   Facial expression:  Responsive   Attitude toward examiner:  Cooperative   Thought and Language  Speech flow: Clear and Coherent   Thought content:  Appropriate to Mood and Circumstances   Preoccupation:  Suicide   Hallucinations:  None   Organization:  No data recorded  [employer] of Knowledge:  Good   Intelligence:  Average   Abstraction:  Abstract    Judgement:  Impaired   Reality Testing:  Distorted   Insight:  Fair   Decision Making:  Impulsive   Social Functioning  Social Maturity:  Impulsive   Social Judgement:  Heedless   Stress  Stressors:  Relationship   Coping Ability:  Human resources officer Deficits:  Decision making   Supports:  Family     Religion: Religion/Spirituality Are You A Religious Person?: No  Leisure/Recreation: Leisure / Recreation Do You Have Hobbies?: No  Exercise/Diet: Exercise/Diet Do You Exercise?: No Have You Gained or Lost A Significant Amount of Weight in the Past Six Months?: Yes-Lost Number of Pounds Lost?:  (Pt unable to quantify specific amount) Do You Follow a Special Diet?: No Do You Have Any Trouble Sleeping?: Yes Explanation of Sleeping Difficulties: Pt unable to sleep   CCA Employment/Education Employment/Work Situation: Employment / Work Situation Employment situation: Unemployed (Pt reported that he recently quit his job.) Patient's job has been impacted by current illness: Yes Describe how patient's job has been impacted: Pt reports having recently quit his job due to frequent confrontation with his coworkers. What is the longest time patient has a held a job?: None reported Where was the patient employed at that time?: None reported Has patient ever been in the [employer]?: No  Education: Education Is Patient Currently Attending School?: No Last Grade Completed: 12 Name of High School: None noted Did Garment/textile technologist From [employer]?: Yes Did You Attend College?: No Did You Attend Graduate School?: No Did You Have Any Special Interests In School?: None noted Did You Have An Individualized Education Program (IIEP): No Did You Have Any Difficulty At School?: No Patient's Education Has Been Impacted by Current Illness: No   CCA Family/Childhood History Family and Relationship History: Family history Marital status: Single Are you sexually active?: No What is  your sexual orientation?: Heterosexual Has your sexual activity been affected by drugs, alcohol, medication, or emotional stress?: No Does patient have children?: No  Childhood History:  Childhood History By whom was/is the patient raised?: Both parents Additional childhood history information: Pt reported having a good enough childhood Description of patient's relationship with caregiver when they were a child: Unremarkable Patient's description of current relationship with people who raised him/her: PT reports that his family relationships have gotten shaky How were you disciplined when you got in trouble as a child/adolescent?: Punishments Does patient have siblings?: Yes Number of Siblings: 6 Description of patient's current relationship with siblings: Patient reports that his siblings are supportive Did patient suffer any verbal/emotional/physical/sexual abuse as a child?: No Did patient suffer from severe childhood neglect?: No Has patient ever been sexually abused/assaulted/raped as an adolescent or adult?: No Was the patient ever a victim of a crime or a disaster?: No Witnessed domestic violence?: No Has patient been affected by domestic violence as an adult?: No  Child/Adolescent Assessment:     CCA Substance Use Alcohol/Drug Use: Alcohol /  Drug Use Pain Medications: See PTA Prescriptions: See PTA Over the Counter: None noted History of alcohol / drug use?: Yes Longest period of sobriety (when/how long): None noted Negative Consequences of Use: Personal relationships Substance #1 Name of Substance 1: Cannabis 1 - Age of First Use: None noted 1 - Amount (size/oz): None noted 1 - Frequency: None noted 1 - Duration: None noted 1 - Last Use / Amount: 05/12/20                       ASAM's:  Six Dimensions of Multidimensional Assessment  Dimension 1:  Acute Intoxication and/or Withdrawal Potential:      Dimension 2:  Biomedical Conditions and Complications:       Dimension 3:  Emotional, Behavioral, or Cognitive Conditions and Complications:     Dimension 4:  Readiness to Change:     Dimension 5:  Relapse, Continued use, or Continued Problem Potential:     Dimension 6:  Recovery/Living Environment:     ASAM Severity Score:    ASAM Recommended Level of Treatment:     Substance use Disorder (SUD) Substance Use Disorder (SUD)  Checklist Symptoms of Substance Use: Continued use despite having a persistent/recurrent physical/psychological problem caused/exacerbated by use,Persistent desire or unsuccessful efforts to cut down or control use  Recommendations for Services/Supports/Treatments: Per Psych MD Dr. Toni Amend pt is recommended for inpatient treatment.     DSM5 Diagnoses: Patient Active Problem List   Diagnosis Date Noted  . Paranoia (HCC) 07/16/2019  . Bipolar affective disorder, mixed, severe, with psychotic behavior (HCC) 07/16/2019  . Herpes 01/25/2018  . Tobacco use disorder 11/25/2014  . Alcohol use disorder, severe, dependence (HCC) 11/25/2014  . Cannabis use disorder, severe, dependence (HCC) 11/25/2014  . 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 Opioid use disorder, moderate, dependence (HCC) 11/25/2014  . 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 Severe recurrent major depression without psychotic features (HCC) 11/24/2014    Patient Centered Plan: Referrals to Alternative Service(s): Referred to Alternative Service(s):   Place:   Date:   Time:    Referred to Alternative Service(s):   Place:   Date:   Time:    Referred to Alternative Service(s):   Place:   Date:   Time:    Referred to Alternative Service(s):   Place:   Date:   Time:

## 2020-05-12 NOTE — ED Notes (Signed)
Report received from Amy, RN including Situation, Background, Assessment, and Recommendations. Patient alert and oriented, warm and dry, and in no acute distress. Patient denies SI, HI, AVH and pain. Patient made aware of Q15 minute rounds and Rover and Officer presence for their safety. Patient instructed to come to this nurse with needs or concerns. 

## 2020-05-12 NOTE — ED Notes (Signed)
Pt provided with snack and drink now.

## 2020-05-12 NOTE — BH Assessment (Signed)
Patient has been accepted to [facility].  Patient assigned to [facility]   Accepting physician is Dr. Sallyanne Kuster.  Call report to [phone].  Representative was Tobi Bastos.   ER Staff is aware of it:  Rivka Barbara, ER Secretary  Dr. Larinda Buttery, ER MD  Thayer Ohm, Patient's Nurse     Patient can be transported anytime after 8am on 05/13/20.

## 2020-05-13 NOTE — ED Provider Notes (Signed)
Emergency Medicine Observation Re-evaluation Note  Benjamyn Hestand is a 30 y.o. male, seen on rounds today.  Pt initially presented to the ED for complaints of Mental Health Problem  Currently, the patient is is no acute distress. Planning to go [facility] today.  Physical Exam  Blood pressure 123/74, pulse 73, temperature 98.3 F (36.8 C), temperature source Oral, resp. rate 18, height 5\' 6"  (1.676 m), weight 61.2 kg, SpO2 99 %.  Physical Exam General: No apparent distress HEENT: moist mucous membranes CV: RRR Pulm: Normal WOB GI: soft and non tender MSK: no edema or cyanosis Neuro: face symmetric, moving all extremities     ED Course / MDM     I have reviewed the labs performed to date as well as medications administered while in observation.  Recent changes in the last 24 hours include going to [facility]   Plan   Current plan is to continue to wait for psych plan/placement if felt warranted  Patient is under full IVC at this time.   , MD 05/13/20 [phone]

## 2020-05-13 NOTE — ED Notes (Signed)
SHERIFF HERE TO PICK PATIENT UP AND TRANSFER TO [facility]. BELONGING RETURNED, PATIENT UNDRESSED FROM [facility] TO HIS [facility] FOR TRANSPORT.

## 2020-05-13 NOTE — ED Notes (Signed)
REPORT CALLED AND GIVEN TO RECEIVING NURSE JESSICA RN

## 2020-05-13 NOTE — ED Notes (Signed)
[place]  SHERIFF  DEPT  CALLED  FOR TRANSPORT TO  [facility] 

## 2020-05-21 ENCOUNTER — Other Ambulatory Visit: Payer: Self-pay

## 2020-05-23 ENCOUNTER — Other Ambulatory Visit: Payer: Self-pay

## 2020-05-23 ENCOUNTER — Encounter: Payer: Self-pay | Admitting: Emergency Medicine

## 2020-05-23 ENCOUNTER — Emergency Department
Admission: EM | Admit: 2020-05-23 | Discharge: 2020-05-23 | Disposition: A | Discharge status: Home / Self Care | Payer: 59 | Attending: Emergency Medicine | Admitting: Emergency Medicine

## 2020-05-23 DIAGNOSIS — R140 Abdominal distension (gaseous): Secondary | ICD-10-CM | POA: Diagnosis not present

## 2020-05-23 DIAGNOSIS — F17210 Nicotine dependence, cigarettes, uncomplicated: Secondary | ICD-10-CM | POA: Diagnosis not present

## 2020-05-23 LAB — URINE DRUG SCREEN, QUALITATIVE (ARMC ONLY)
Amphetamines, Ur Screen: NOT DETECTED
Barbiturates, Ur Screen: NOT DETECTED
Benzodiazepine, Ur Scrn: NOT DETECTED
Cannabinoid 50 Ng, Ur ~~LOC~~: POSITIVE — AB
Cocaine Metabolite,Ur ~~LOC~~: NOT DETECTED
MDMA (Ecstasy)Ur Screen: NOT DETECTED
Methadone Scn, Ur: NOT DETECTED
Opiate, Ur Screen: NOT DETECTED
Phencyclidine (PCP) Ur S: NOT DETECTED
Tricyclic, Ur Screen: NOT DETECTED

## 2020-05-23 LAB — COMPREHENSIVE METABOLIC PANEL
ALT: 20 U/L (ref 0–44)
AST: 27 U/L (ref 15–41)
Albumin: 3.9 g/dL (ref 3.5–5.0)
Alkaline Phosphatase: 59 U/L (ref 38–126)
Anion gap: 8 (ref 5–15)
BUN: 11 mg/dL (ref 6–20)
CO2: 25 mmol/L (ref 22–32)
Calcium: 8.8 mg/dL — ABNORMAL LOW (ref 8.9–10.3)
Chloride: 103 mmol/L (ref 98–111)
Creatinine, Ser: 0.85 mg/dL (ref 0.61–1.24)
GFR, Estimated: >60 mL/min (ref >60)
Glucose, Bld: 108 mg/dL — ABNORMAL HIGH (ref 70–99)
Potassium: 4.3 mmol/L (ref 3.5–5.1)
Sodium: 136 mmol/L (ref 135–145)
Total Bilirubin: 0.5 mg/dL (ref 0.3–1.2)
Total Protein: 6.5 g/dL (ref 6.5–8.1)

## 2020-05-23 LAB — CBC WITH DIFFERENTIAL/PLATELET
Abs Immature Granulocytes: 0.1 10*3/uL — ABNORMAL HIGH (ref 0.00–0.07)
Basophils Absolute: 0 10*3/uL (ref 0.0–0.1)
Basophils Relative: 0 %
Eosinophils Absolute: 0.2 10*3/uL (ref 0.0–0.5)
Eosinophils Relative: 2 %
HCT: 41 % (ref 39.0–52.0)
Hemoglobin: 14.2 g/dL (ref 13.0–17.0)
Immature Granulocytes: 1 %
Lymphocytes Relative: 20 %
Lymphs Abs: 2.2 10*3/uL (ref 0.7–4.0)
MCH: 30.9 pg (ref 26.0–34.0)
MCHC: 34.6 g/dL (ref 30.0–36.0)
MCV: 89.3 fL (ref 80.0–100.0)
Monocytes Absolute: 1.3 10*3/uL — ABNORMAL HIGH (ref 0.1–1.0)
Monocytes Relative: 11 %
Neutro Abs: 7.5 10*3/uL (ref 1.7–7.7)
Neutrophils Relative %: 66 %
Platelets: 265 10*3/uL (ref 150–400)
RBC: 4.59 MIL/uL (ref 4.22–5.81)
RDW: 13.7 % (ref 11.5–15.5)
WBC: 11.4 10*3/uL — ABNORMAL HIGH (ref 4.0–10.5)
nRBC: 0 % (ref 0.0–0.2)

## 2020-05-23 LAB — URINALYSIS, COMPLETE (UACMP) WITH MICROSCOPIC
Bacteria, UA: NONE SEEN
Bilirubin Urine: NEGATIVE
Glucose, UA: NEGATIVE mg/dL
Hgb urine dipstick: NEGATIVE
Ketones, ur: NEGATIVE mg/dL
Leukocytes,Ua: NEGATIVE
Nitrite: NEGATIVE
Protein, ur: NEGATIVE mg/dL
Specific Gravity, Urine: 1.017 (ref 1.005–1.030)
Squamous Epithelial / HPF: NONE SEEN (ref 0–5)
WBC, UA: NONE SEEN WBC/hpf (ref 0–5)
pH: 6 (ref 5.0–8.0)

## 2020-05-23 LAB — LACTIC ACID, PLASMA: Lactic Acid, Venous: 1.9 mmol/L (ref 0.5–1.9)

## 2020-05-23 LAB — LIPASE, BLOOD: Lipase: 50 U/L (ref 11–51)

## 2020-05-23 LAB — ETHANOL: Alcohol, Ethyl (B): <10 mg/dL (ref <10)

## 2020-05-23 NOTE — ED Triage Notes (Signed)
Pt to ED via [facility] with c/o abdominal pain and swelling x 30 mins, per EMS pt's abdomen soft and nontender at this time.   148/92 90-106 NSR-ST 100% RA

## 2020-05-23 NOTE — ED Provider Notes (Signed)
[facility] Emergency Department Provider Note  ____________________________________________  Time seen: Approximately 2:55 PM  I have reviewed the triage vital signs and the nursing notes.   HISTORY  Chief Complaint Abdominal distention    HPI Roger [name] is a 30 y.o. male who presents the emergency department complaining of abdominal distention.  Patient states that he saw his abdomen distended today.  He has had no other symptoms to include fevers or chills, nasal congestion, sore throat, coughing, emesis, abdominal pain, diarrhea, constipation, dysuria, polyuria, hematuria.  Patient does have a history of paranoia, depression, substance abuse.  He states that he supposed to be taking antipsychotics but states that he stopped those 5 days ago "just because."  No complaints at this time.  No medications for his complaint prior to arrival         Past Medical History:  Diagnosis Date  . Depression   . Headache   . Substance abuse [facility])     Patient Active Problem List   Diagnosis Date Noted  . Paranoia (HCC) 07/16/2019  . Bipolar affective disorder, mixed, severe, with psychotic behavior (HCC) 07/16/2019  . Herpes 01/25/2018  . Tobacco use disorder 11/25/2014  . Alcohol use disorder, severe, dependence (HCC) 11/25/2014  . Cannabis use disorder, severe, dependence (HCC) 11/25/2014  . 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 Opioid use disorder, moderate, dependence (HCC) 11/25/2014  . 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 Severe recurrent major depression without psychotic features (HCC) 11/24/2014    Past Surgical History:  Procedure Laterality Date  . FEMUR FRACTURE SURGERY      Prior to Admission medications   Medication Sig Start Date End Date Taking? Authorizing Provider  cyclobenzaprine (FLEXERIL) 10 MG tablet Take 1 tablet (10 mg total)  by mouth 3 (three) times daily as needed for muscle spasms. 08/20/19   Emily Filbert, MD  ibuprofen (ADVIL) 600 MG tablet Take 1 tablet (600 mg total) by mouth every 8 (eight) hours as needed. 08/20/19   Emily Filbert, MD  paliperidone (INVEGA SUSTENNA) 234 MG/1.5ML SUSY injection Inject 234 mg into the muscle every 28 (twenty-eight) days. 08/14/19   Money, Gerlene Burdock, FNP  paliperidone (INVEGA) 6 MG 24 hr tablet Take 1 tablet (6 mg total) by mouth daily. 07/19/19   Money, Gerlene Burdock, FNP  temazepam (RESTORIL) 15 MG capsule Take 1 capsule (15 mg total) by mouth at bedtime as needed for sleep. 07/18/19   Money, Gerlene Burdock, FNP    Allergies Haldol [haloperidol lactate]  Family History  Problem Relation Age of Onset  . Alcohol abuse Father     Social History Social History   Tobacco Use  . Smoking status: Current Every Day Smoker    Packs/day: 0.50    Years: 10.00    Pack years: 5.00    Types: Cigarettes  . Smokeless tobacco: Former Neurosurgeon    Quit date: 11/24/2014  Vaping Use  . Vaping Use: Never used  Substance Use Topics  . Alcohol use: Yes    Alcohol/week: 10.0 standard drinks    Types: 3 Cans of beer, 7 Shots of liquor per week  . Drug use: Yes    Types: Amphetamines, Cocaine, Marijuana, Benzodiazepines, MDMA (Ecstacy), Hydrocodone     Review of Systems  Constitutional: No fever/chills Eyes: No visual changes. No discharge ENT: No upper respiratory complaints. Cardiovascular: no chest pain. Respiratory: no cough. No SOB. Gastrointestinal: Patient reports  abdominal distention.  No abdominal pain.  No nausea, no vomiting.  No diarrhea.  No constipation. Genitourinary: Negative for dysuria. No hematuria Musculoskeletal: Negative for musculoskeletal pain. Skin: Negative for rash, abrasions, lacerations, ecchymosis. Neurological: Negative for headaches, focal weakness or numbness.  10 System ROS otherwise negative.  ____________________________________________   PHYSICAL  EXAM:  VITAL SIGNS: ED Triage Vitals [05/23/20 1435]  Enc Vitals Group     BP (!) 133/91     Pulse Rate 100     Resp 18     Temp 98.9 F (37.2 C)     Temp src      SpO2 100 %     Weight 135 lb (61.2 kg)     Height 5\' 6"  (1.676 m)     Head Circumference      Peak Flow      Pain Score 0     Pain Loc      Pain Edu?      Excl. in GC?      Constitutional: Alert and oriented. Well appearing and in no acute distress. Eyes: Conjunctivae are normal. PERRL. EOMI. Head: Atraumatic. ENT:      Ears:       Nose: No congestion/rhinnorhea.      Mouth/Throat: Mucous membranes are moist.  Neck: No stridor.    Cardiovascular: Normal rate, regular rhythm. Normal S1 and S2.  Good peripheral circulation. Respiratory: Normal respiratory effort without tachypnea or retractions. Lungs CTAB. Good air entry to the bases with no decreased or absent breath sounds. Gastrointestinal: No external abdominal findings.  Bowel sounds 4 quadrants. Soft and nontender to palpation. No guarding or rigidity. No palpable masses. No visible distention. No CVA tenderness. Musculoskeletal: Full range of motion to all extremities. No gross deformities appreciated. Neurologic:  Normal speech and language. No gross focal neurologic deficits are appreciated.  Skin:  Skin is warm, dry and intact. No rash noted. Psychiatric: Mood and affect are normal. Speech and behavior are normal. Patient exhibits appropriate insight and judgement.   ____________________________________________   LABS (all labs ordered are listed, but only abnormal results are displayed)  Labs Reviewed  COMPREHENSIVE METABOLIC PANEL - Abnormal; Notable for the following components:      Result Value   Glucose, Bld 108 (*)    Calcium 8.8 (*)    All other components within normal limits  CBC WITH DIFFERENTIAL/PLATELET - Abnormal; Notable for the following components:   WBC 11.4 (*)    Monocytes Absolute 1.3 (*)    Abs Immature Granulocytes 0.10  (*)    All other components within normal limits  URINALYSIS, COMPLETE (UACMP) WITH MICROSCOPIC - Abnormal; Notable for the following components:   Color, Urine YELLOW (*)    APPearance CLEAR (*)    All other components within normal limits  URINE DRUG SCREEN, QUALITATIVE ([facility] ONLY) - Abnormal; Notable for the following components:   Cannabinoid 50 Ng, Ur [place] POSITIVE (*)    All other components within normal limits  ETHANOL  LIPASE, BLOOD  LACTIC ACID, PLASMA   ____________________________________________  EKG   ____________________________________________  RADIOLOGY   No results found.  ____________________________________________    PROCEDURES  Procedure(s) performed:    Procedures    Medications - No data to display   ____________________________________________   INITIAL IMPRESSION / ASSESSMENT AND PLAN / ED COURSE  Pertinent labs & imaging results that were available during my care of the patient were reviewed by me and considered in my medical decision making (see chart  for details).  Review of the [place] CSRS was performed in accordance of the [facility] prior to dispensing any controlled drugs.         Patient eloped prior to return results.  Patient came in complaining of abdominal distention.  No gross distention identified but labs were ordered.  At this time, patient had not been tender in, there was no significant exam findings and imaging had not been ordered until labs return.  Patient apparently eloped due to to not wanting to wait any longer.  Patient had removed his IV by himself, left premises, then returned to make sure the entire IV had been removed.  Patient was encouraged to wait to talk to me but by the time I entered the room patient had eloped again.  At this time, there had not been any acute findings on exam.  Labs are reassuring.  Final diagnosis was not given to patient prior to elopement.  No medications  prescribed.  ____________________________________________  FINAL CLINICAL IMPRESSION(S) / ED DIAGNOSES  Final diagnoses:  None      NEW MEDICATIONS STARTED DURING THIS VISIT:  ED Discharge Orders    None          This chart was dictated using voice recognition software/Dragon. Despite best efforts to proofread, errors can occur which can change the meaning. Any change was purely unintentional.    Racheal Patches, PA-C 05/23/20 1829    Merwyn Katos, MD 05/24/20 [phone]

## 2020-05-23 NOTE — ED Notes (Addendum)
Pt to ED52A in wheelchair at this time. Pt states abdominal distention that started at around 1300. Pt states PO intake today was 2 chicken biscuits and a smoothie.   Pt denies any pain at this time, just states "my stomach is swelling up" Pt abd is soft and non-tender at this time

## 2020-05-23 NOTE — ED Notes (Addendum)
Per Christiane Ha PA, pt and pt personal belongings were not in room at this time. Bathroom and other areas of ED checked, pt not found. Pt eloped at this time.

## 2020-05-23 NOTE — ED Triage Notes (Signed)
Pt to ED via [facility] from home for abdominal swelling x 30 minutes. Pt states that he body has also felt hot. Pt is in NAD, VSS.

## 2020-05-23 NOTE — ED Notes (Addendum)
This RN to bedside. Pt in restroom at this time, this RN noted PIV sitting at bedside. Upon pt return from bathroom pt stated "I took it out because I was about to leave. This is taking too long, I don't care what happens anymore"  This RN informed pt that the provider will be in to see him in just a few minutes. Pt verbalized understanding at this time and sat in chair at bedside

## 2020-11-05 ENCOUNTER — Other Ambulatory Visit: Payer: Self-pay

## 2021-01-20 IMAGING — CR DG CHEST 2V
1 series · 2 of 2 positions shown · non-contrast
Comparison: 08/18/2019

CLINICAL DATA: Chest pain

EXAM:
CHEST - 2 VIEW

[Series 1: dg chest 2 view · 0.14mm/px · 2 of 2 slices shown]
[im 1/2]
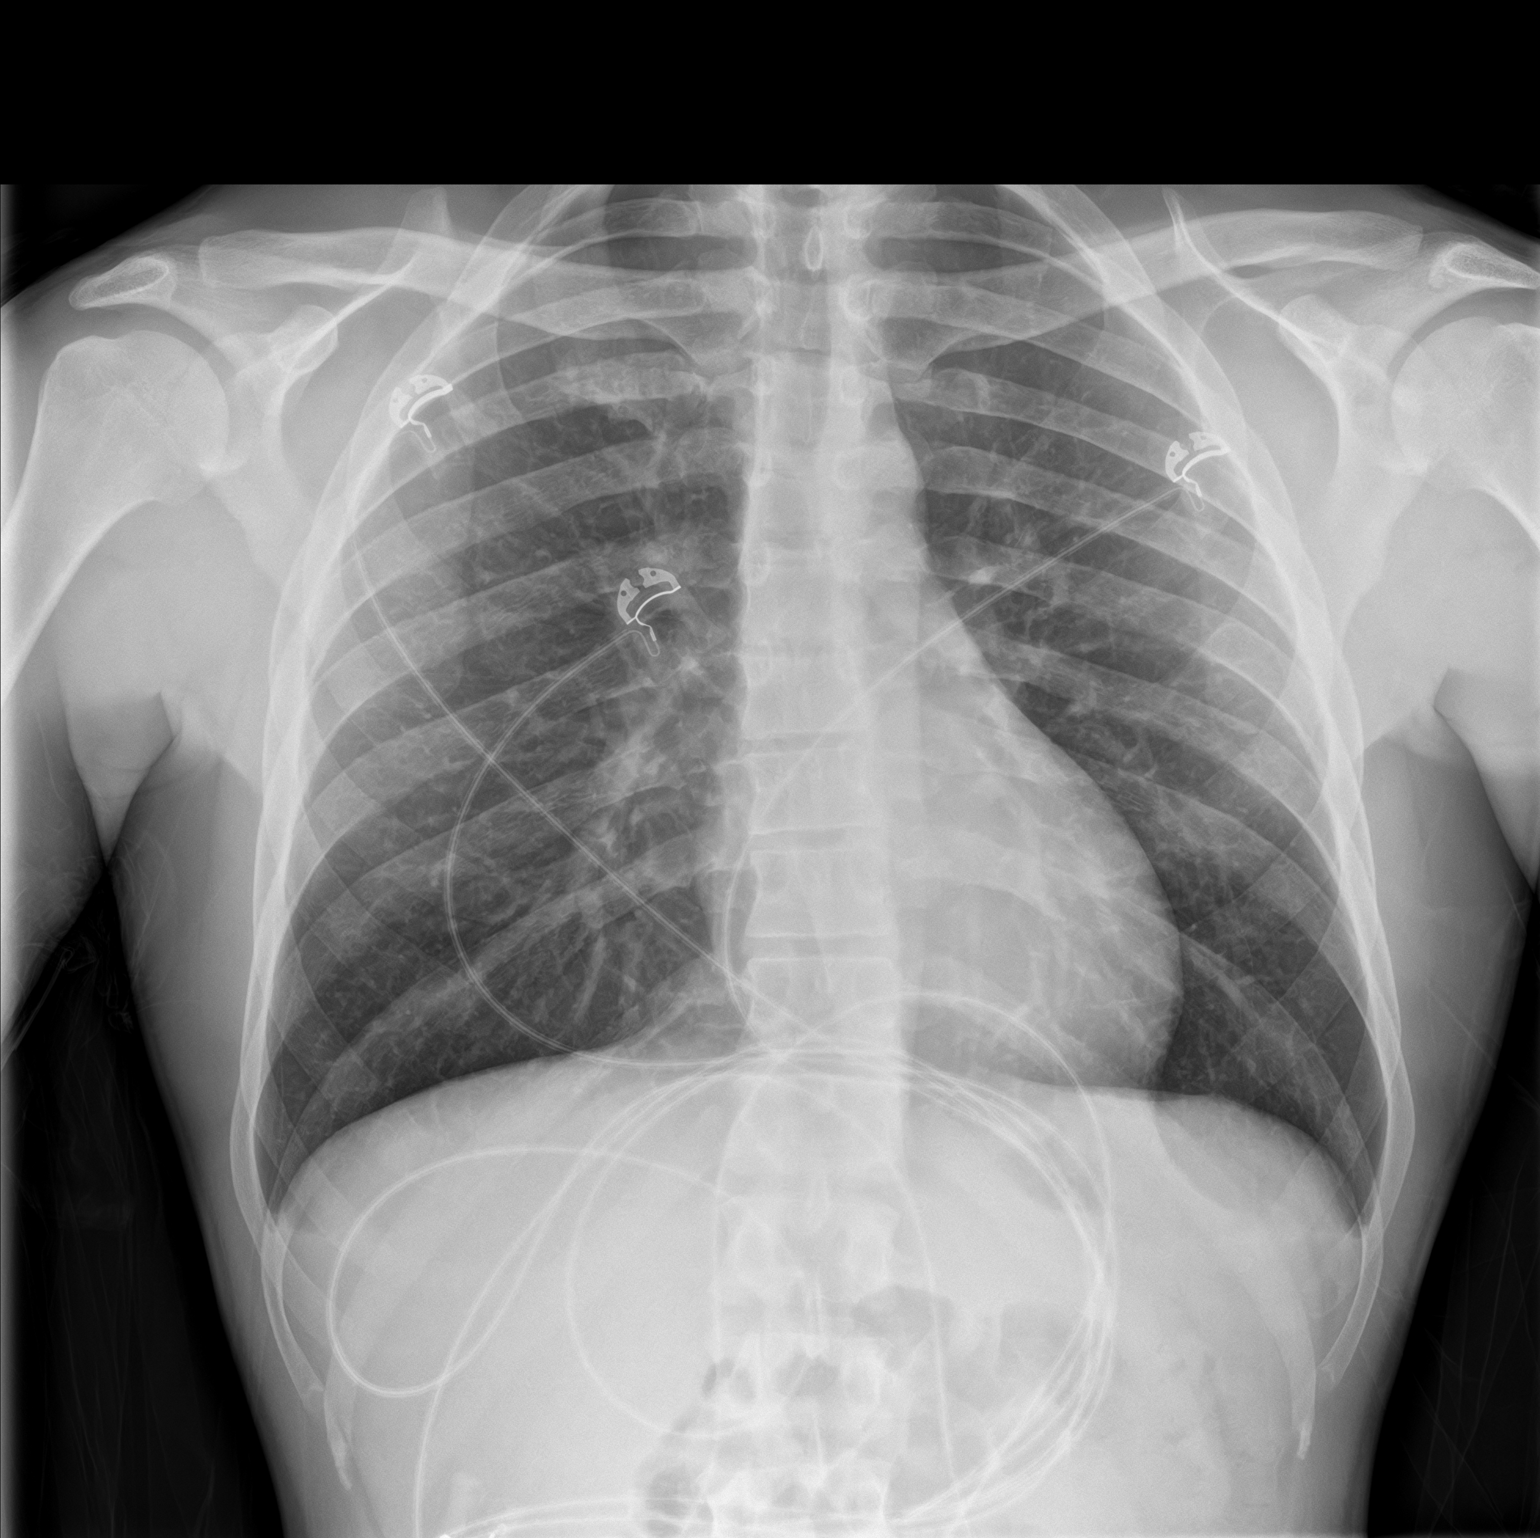
[im 2/2]
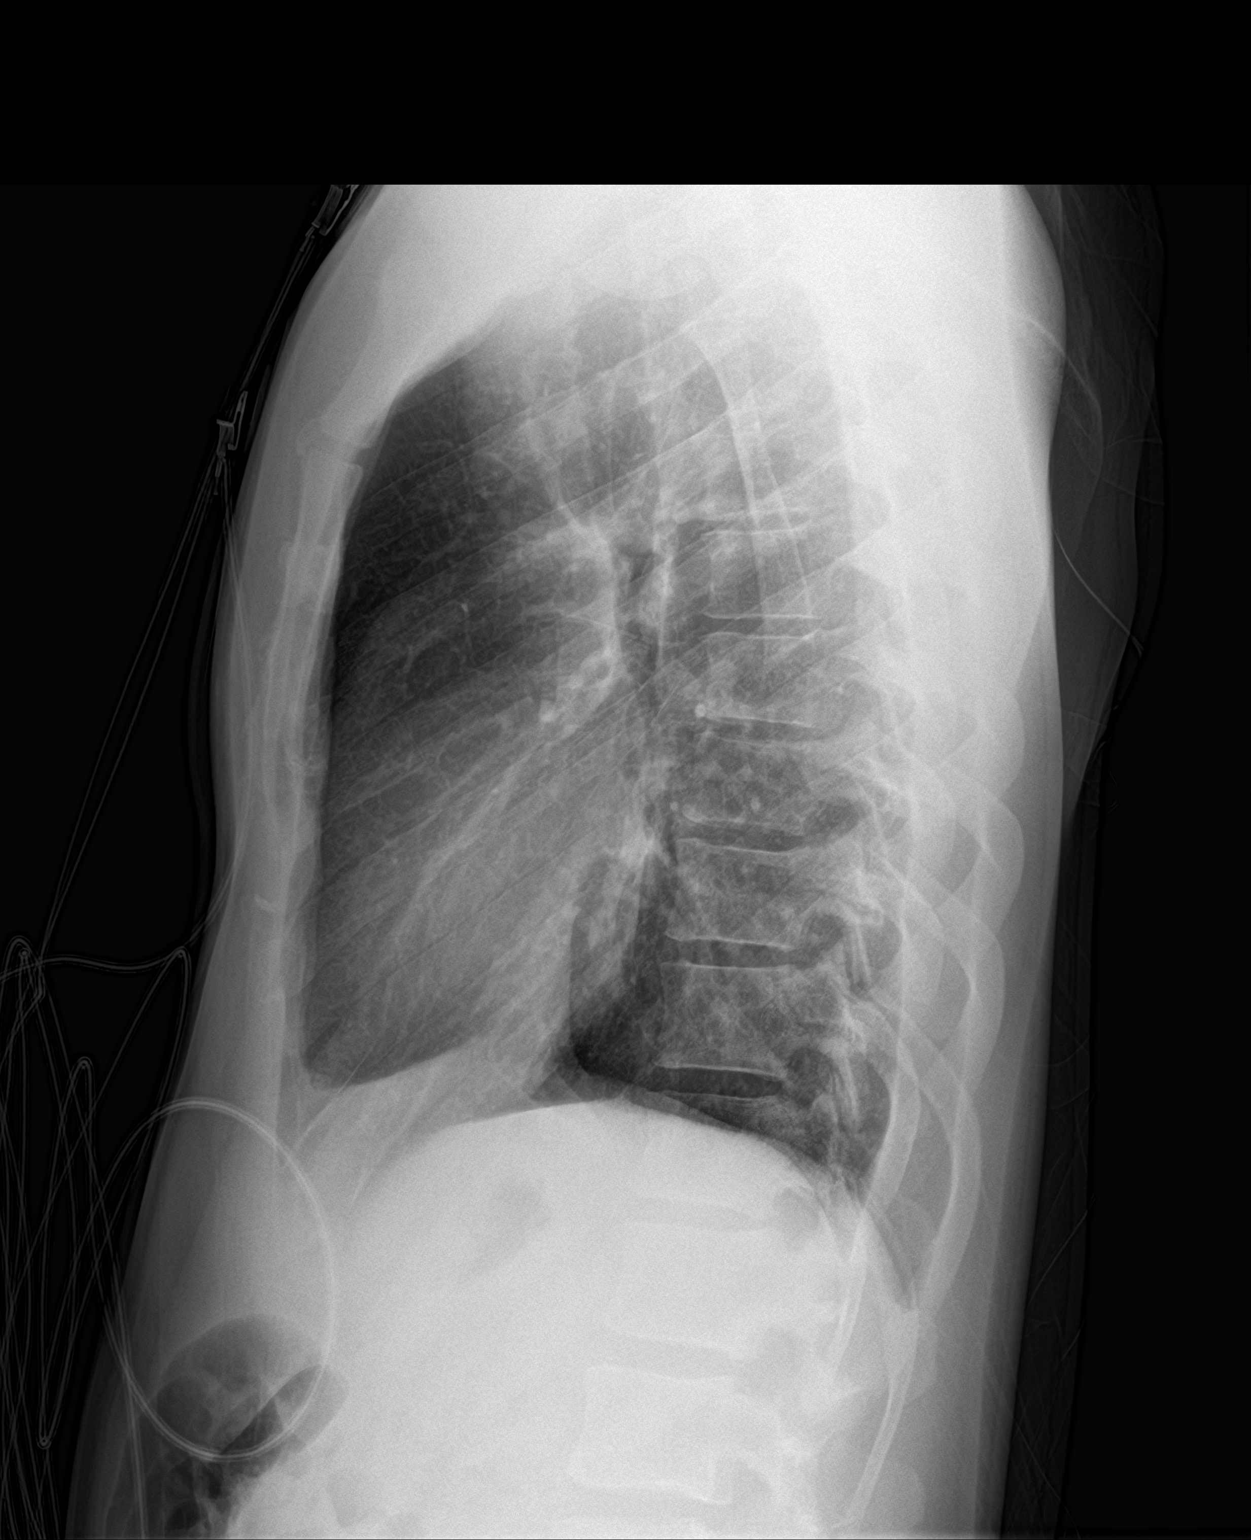

[2 of 2 positions shown; findings below may reference images not displayed]

FINDINGS: The heart size and mediastinal contours are within normal limits.
Both lungs are clear. The visualized skeletal structures are
unremarkable.
IMPRESSION: No active cardiopulmonary disease.

## 2021-03-31 ENCOUNTER — Other Ambulatory Visit: Payer: Self-pay

## 2021-03-31 ENCOUNTER — Encounter: Payer: Self-pay | Admitting: Intensive Care

## 2021-03-31 ENCOUNTER — Emergency Department
Admission: EM | Admit: 2021-03-31 | Discharge: 2021-04-01 | Disposition: A | Discharge status: Other Acute Inpatient Hospital | Payer: 59 | Attending: Student in an Organized Health Care Education/Training Program | Admitting: Student in an Organized Health Care Education/Training Program

## 2021-03-31 DIAGNOSIS — F3112 Bipolar disorder, current episode manic without psychotic features, moderate: Secondary | ICD-10-CM | POA: Diagnosis present

## 2021-03-31 DIAGNOSIS — Z87891 Personal history of nicotine dependence: Secondary | ICD-10-CM | POA: Insufficient documentation

## 2021-03-31 DIAGNOSIS — Y900 Blood alcohol level of less than 20 mg/100 ml: Secondary | ICD-10-CM | POA: Insufficient documentation

## 2021-03-31 DIAGNOSIS — F3164 Bipolar disorder, current episode mixed, severe, with psychotic features: Secondary | ICD-10-CM

## 2021-03-31 DIAGNOSIS — Z20822 Contact with and (suspected) exposure to covid-19: Secondary | ICD-10-CM | POA: Insufficient documentation

## 2021-03-31 DIAGNOSIS — Z79899 Other long term (current) drug therapy: Secondary | ICD-10-CM | POA: Insufficient documentation

## 2021-03-31 DIAGNOSIS — R462 Strange and inexplicable behavior: Secondary | ICD-10-CM

## 2021-03-31 LAB — ETHANOL: Alcohol, Ethyl (B): <10 mg/dL (ref <10)

## 2021-03-31 LAB — CBC
HCT: 43.2 % (ref 39.0–52.0)
Hemoglobin: 14.8 g/dL (ref 13.0–17.0)
MCH: 30.1 pg (ref 26.0–34.0)
MCHC: 34.3 g/dL (ref 30.0–36.0)
MCV: 87.8 fL (ref 80.0–100.0)
Platelets: 312 10*3/uL (ref 150–400)
RBC: 4.92 MIL/uL (ref 4.22–5.81)
RDW: 12.5 % (ref 11.5–15.5)
WBC: 8.4 10*3/uL (ref 4.0–10.5)
nRBC: 0 % (ref 0.0–0.2)

## 2021-03-31 LAB — COMPREHENSIVE METABOLIC PANEL
ALT: 17 U/L (ref 0–44)
AST: 26 U/L (ref 15–41)
Albumin: 5.1 g/dL — ABNORMAL HIGH (ref 3.5–5.0)
Alkaline Phosphatase: 54 U/L (ref 38–126)
Anion gap: 7 (ref 5–15)
BUN: 6 mg/dL (ref 6–20)
CO2: 28 mmol/L (ref 22–32)
Calcium: 9.4 mg/dL (ref 8.9–10.3)
Chloride: 101 mmol/L (ref 98–111)
Creatinine, Ser: 0.99 mg/dL (ref 0.61–1.24)
GFR, Estimated: >60 mL/min (ref >60)
Glucose, Bld: 127 mg/dL — ABNORMAL HIGH (ref 70–99)
Potassium: 3.9 mmol/L (ref 3.5–5.1)
Sodium: 136 mmol/L (ref 135–145)
Total Bilirubin: 1.4 mg/dL — ABNORMAL HIGH (ref 0.3–1.2)
Total Protein: 7.9 g/dL (ref 6.5–8.1)

## 2021-03-31 LAB — URINE DRUG SCREEN, QUALITATIVE (ARMC ONLY)
Amphetamines, Ur Screen: NOT DETECTED
Barbiturates, Ur Screen: NOT DETECTED
Benzodiazepine, Ur Scrn: NOT DETECTED
Cannabinoid 50 Ng, Ur ~~LOC~~: POSITIVE — AB
Cocaine Metabolite,Ur ~~LOC~~: NOT DETECTED
MDMA (Ecstasy)Ur Screen: NOT DETECTED
Methadone Scn, Ur: NOT DETECTED
Opiate, Ur Screen: NOT DETECTED
Phencyclidine (PCP) Ur S: NOT DETECTED
Tricyclic, Ur Screen: NOT DETECTED

## 2021-03-31 LAB — RESP PANEL BY RT-PCR (FLU A&B, COVID) ARPGX2
Influenza A by PCR: NEGATIVE
Influenza B by PCR: NEGATIVE
SARS Coronavirus 2 by RT PCR: NEGATIVE

## 2021-03-31 MED ORDER — PALIPERIDONE ER 1.5 MG PO TB24: 1.5000 mg | ORAL_TABLET | Freq: Every day | ORAL | Status: DC

## 2021-03-31 MED ORDER — MELATONIN 5 MG PO TABS: 5.0000 mg | ORAL_TABLET | Freq: Once | ORAL | Status: DC

## 2021-03-31 MED ORDER — PALIPERIDONE ER 3 MG PO TB24
3.0000 mg | ORAL_TABLET | Freq: Every day | ORAL | Status: DC
  Filled 2021-03-31 (×2): qty 1

## 2021-03-31 NOTE — ED Notes (Signed)
Patient belongings: Black sweat pants, black sweat shirt, black underwear, black nike shoes, black socks, black t shirt.

## 2021-03-31 NOTE — ED Notes (Signed)
IVC, psych consult complete, pend placement 

## 2021-03-31 NOTE — ED Notes (Signed)
This RN explained importance of complying with medication. Pt still refusing to take any medication.

## 2021-03-31 NOTE — BH Assessment (Signed)
Referral information for Psychiatric Hospitalization faxed to:   [facility] [phone]) Under review  Roger [name] [phone]),   Roger [name] [phone]),  [address] [phone]),   Roger [name] [phone] -or- [phone]),   Roger [name] [phone]).  [facility] [phone])

## 2021-03-31 NOTE — ED Provider Notes (Signed)
[facility] Emergency Department Provider Note    Event Date/Time   First MD Initiated Contact with Patient 03/31/21 1247     (approximate)  I have reviewed the triage vital signs and the nursing notes.   HISTORY  Chief Complaint Mental Health Problem    HPI Roger [name] is a 30 y.o. male history of substance abuse as well as paranoid who presents to the ER under IVC due to abnormal behavior at home and reportedly not taking meds.  Grandmother took out IVC paperwork because she feels unsafe at home.  States he does not have any intent for self-harm states he has not been getting any arguments.  States he has nowhere else to live.  Past Medical History:  Diagnosis Date   Depression    Headache    Substance abuse ([place])    Family History  Problem Relation Age of Onset   Alcohol abuse Father    Past Surgical History:  Procedure Laterality Date   FEMUR FRACTURE SURGERY     Patient Active Problem List   Diagnosis Date Noted   Paranoia ([place]) 07/16/2019   Bipolar affective disorder, mixed, severe, with psychotic behavior ([place]) 07/16/2019   Herpes 01/25/2018   Tobacco use disorder 11/25/2014   Alcohol use disorder, severe, dependence ([place]) 11/25/2014   Cannabis use disorder, severe, dependence ([place]) 11/25/2014   Stimulant use disorder (cocaine-amphetamines) 11/25/2014   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 Authorizing Provider  cyclobenzaprine (FLEXERIL) 10 MG tablet Take 1 tablet (10 mg total) by mouth 3 (three) times daily as needed for muscle spasms. 08/20/19   Earleen Newport, MD  ibuprofen (ADVIL) 600 MG tablet Take 1 tablet (600 mg total) by  mouth every 8 (eight) hours as needed. 08/20/19   Earleen Newport, MD  paliperidone (INVEGA SUSTENNA) 234 MG/1.5ML SUSY injection Inject 234 mg into the muscle every 28 (twenty-eight) days. 08/14/19   Money, Lowry Ram, FNP  paliperidone (INVEGA) 6 MG 24 hr tablet Take 1 tablet (6 mg total) by mouth daily. 07/19/19   Money, Lowry Ram, FNP  risperiDONE (RISPERDAL) 1 MG tablet TAKE 2 TABLETS(2MG ) BY MOUTH 2 TIMES A DAY FOR SCHIZPHRENIA 05/21/20 05/21/21    sertraline (ZOLOFT) 100 MG tablet TAKE ONE TABLET BY MOUTH AT BEDTIME FOR DERESSION 05/21/20 12/15/20    temazepam (RESTORIL) 15 MG capsule Take 1 capsule (15 mg total) by mouth at bedtime as needed for sleep. 07/18/19   Money, Lowry Ram, FNP    Allergies Haldol [haloperidol lactate]    Social History Social History   Tobacco Use   Smoking status: Former    Packs/day: 0.50    Years: 10.00    Pack years: 5.00    Types: Cigarettes   Smokeless tobacco: Former    Quit date: 11/24/2014  Vaping Use   Vaping Use: Never used  Substance Use Topics   Alcohol use: Yes    Alcohol/week: 1.0 standard drink    Types: 1 Shots of liquor per week   Drug use: Yes    Types: Amphetamines, Cocaine, Marijuana, Benzodiazepines, MDMA (Ecstacy), Hydrocodone    Review of Systems Patient denies headaches, rhinorrhea, blurry vision, numbness, shortness of breath, chest pain, edema, cough, abdominal  pain, nausea, vomiting, diarrhea, dysuria, fevers, rashes or hallucinations unless otherwise stated above in HPI. ____________________________________________   PHYSICAL EXAM:  VITAL SIGNS: Vitals:   03/31/21 1233  BP: 135/81  Pulse: 96  Resp: 16  Temp: 98.4 F (36.9 C)  SpO2: 95%    Constitutional: Alert and oriented.  Eyes: Conjunctivae are normal.  Head: Atraumatic. Nose: No congestion/rhinnorhea. Mouth/Throat: Mucous membranes are moist.   Neck: No stridor. Painless ROM.  Cardiovascular: Normal rate, regular rhythm. Grossly normal heart sounds.  Good  peripheral circulation. Respiratory: Normal respiratory effort.  No retractions. Lungs CTAB. Gastrointestinal: Soft and nontender. No distention. No abdominal bruits. No CVA tenderness. Genitourinary:  Musculoskeletal: No lower extremity tenderness nor edema.  No joint effusions. Neurologic:  Normal speech and language. No gross focal neurologic deficits are appreciated. No facial droop Skin:  Skin is warm, dry and intact. No rash noted. Psychiatric: Mood and affect are normal. Speech and behavior are normal.  ____________________________________________   LABS (all labs ordered are listed, but only abnormal results are displayed)  Results for orders placed or performed during the hospital encounter of 03/31/21 (from the past 24 hour(s))  cbc     Status: None   Collection Time: 03/31/21 12:37 PM  Result Value Ref Range   WBC 8.4 4.0 - 10.5 K/uL   RBC 4.92 4.22 - 5.81 MIL/uL   Hemoglobin 14.8 13.0 - 17.0 g/dL   HCT 16.1 09.6 - 04.5 %   MCV 87.8 80.0 - 100.0 fL   MCH 30.1 26.0 - 34.0 pg   MCHC 34.3 30.0 - 36.0 g/dL   RDW 40.9 81.1 - 91.4 %   Platelets 312 150 - 400 K/uL   nRBC 0.0 0.0 - 0.2 %   ____________________________________________ ____________________________________________   PROCEDURES  Procedure(s) performed:  Procedures    Critical Care performed: no ____________________________________________   INITIAL IMPRESSION / ASSESSMENT AND PLAN / ED COURSE  Pertinent labs & imaging results that were available during my care of the patient were reviewed by me and considered in my medical decision making (see chart for details).   DDX: Psychosis, delirium, medication effect, noncompliance, polysubstance abuse, Si, Hi, depression   Roger [name] is a 30 y.o. who presents to the ED for evaluation of bizarre behavior.  Patient has psych history of paranoia.  Laboratory testing was ordered to evaluation for underlying electrolyte derangement or signs of  underlying organic pathology to explain today's presentation.  Based on history and physical and laboratory evaluation, it appears that the patient's presentation is 2/2 underlying psychiatric disorder and will require further evaluation and management by inpatient psychiatry.  Patient was  made an IVC PTA.  Disposition pending psychiatric evaluation.  The patient has been placed in psychiatric observation due to the need to provide a safe environment for the patient while obtaining psychiatric consultation and evaluation, as well as ongoing medical and medication management to treat the patient's condition.  The patient has been placed under full IVC at this time.     The patient was evaluated in Emergency Department today for the symptoms described in the history of present illness. He/she was evaluated in the context of the global COVID-19 pandemic, which necessitated consideration that the patient might be at risk for infection with the SARS-CoV-2 virus that causes COVID-19. Institutional protocols and algorithms that pertain to the evaluation of patients at risk for COVID-19 are in a state of rapid change based on information released by regulatory bodies including the CDC and  federal and state organizations. These policies and algorithms were followed during the patient's care in the ED.  As part of my medical decision making, I reviewed the following data within the [place] notes reviewed and incorporated, Labs reviewed, notes from prior ED visits and [place] Controlled Substance Database   ____________________________________________   FINAL CLINICAL IMPRESSION(S) / ED DIAGNOSES  Final diagnoses:  Bizarre behavior      NEW MEDICATIONS STARTED DURING THIS VISIT:  New Prescriptions   No medications on file     Note:  This document was prepared using Dragon voice recognition software and may include unintentional dictation errors.    Merlyn Lot,  MD 03/31/21 1256

## 2021-03-31 NOTE — ED Notes (Signed)
Pt given meal tray.
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
## 2021-03-31 NOTE — ED Notes (Signed)
Pt refusing to take medication at this time. States "im not taking any medication from yall"
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
## 2021-03-31 NOTE — ED Notes (Signed)
IVC 
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
## 2021-04-01 ENCOUNTER — Inpatient Hospital Stay
Admission: AD | Admit: 2021-04-01 | Discharge: 2021-04-08 | DRG: 885 | Disposition: A | Discharge status: Home / Self Care | Payer: 59 | Source: Intra-hospital | Attending: Psychiatry | Admitting: Psychiatry

## 2021-04-01 ENCOUNTER — Other Ambulatory Visit: Payer: Self-pay

## 2021-04-01 ENCOUNTER — Encounter: Payer: Self-pay | Admitting: Psychiatry

## 2021-04-01 DIAGNOSIS — Z87891 Personal history of nicotine dependence: Secondary | ICD-10-CM

## 2021-04-01 DIAGNOSIS — Z20822 Contact with and (suspected) exposure to covid-19: Secondary | ICD-10-CM | POA: Diagnosis present

## 2021-04-01 DIAGNOSIS — F3112 Bipolar disorder, current episode manic without psychotic features, moderate: Secondary | ICD-10-CM | POA: Diagnosis present

## 2021-04-01 DIAGNOSIS — Z885 Allergy status to narcotic agent status: Secondary | ICD-10-CM

## 2021-04-01 MED ORDER — PALIPERIDONE ER 3 MG PO TB24
3.0000 mg | ORAL_TABLET | Freq: Every day | ORAL | Status: DC
  Administered 2021-04-05 – 2021-04-06 (×2): 3 mg via ORAL
  Filled 2021-04-01 (×2): qty 1

## 2021-04-01 MED ORDER — ALUM & MAG HYDROXIDE-SIMETH 200-200-20 MG/5ML PO SUSP: 30.0000 mL | ORAL | Status: DC | PRN

## 2021-04-01 MED ORDER — MAGNESIUM HYDROXIDE 400 MG/5ML PO SUSP: 30.0000 mL | Freq: Every day | ORAL | Status: DC | PRN

## 2021-04-01 MED ORDER — ACETAMINOPHEN 325 MG PO TABS: 650.0000 mg | ORAL_TABLET | Freq: Four times a day (QID) | ORAL | Status: DC | PRN

## 2021-04-01 NOTE — Progress Notes (Signed)
Patient ID: Roger [name], male   DOB: [birth year], 30 y.o.   MRN: [id number] Admission Note: report received from Amy RN patient is a IVC'd 30 year old male from [facility] ED Pt was calm and cooperative during assessment. Denies SI/HI/AVH. Admission plan of care reviewed with pt, consent signed.  Personal belongings/skin assessment completed.   No contraband found.  Patient oriented to the unit, staff and room.  Routine safety checks initiated.  Verbalizes understanding of unit rules/protocols.   Patient is presently safe on the unit. No unsafe behaviors noted.  Q 15 minute safety checks maintained per unit protocol.

## 2021-04-01 NOTE — ED Provider Notes (Signed)
Emergency Medicine Observation Re-evaluation Note  Roger [name] is a 30 y.o. male, seen on rounds today.  Pt initially presented to the ED for complaints of Mental Health Problem Currently, the patient is resting.  Physical Exam  BP (!) 127/92 (BP Location: Left Arm)    Pulse 81    Temp 98.6 F (37 C) (Oral)    Resp 16    Ht 1.676 m (5\' 6" )    Wt 61.2 kg    SpO2 96%    BMI 21.79 kg/m  Physical Exam Gen:  No acute distress Resp:  Breathing easily and comfortably, no accessory muscle usage Neuro:  Moving all four extremities, no gross focal neuro deficits Psych:  Resting currently, calm when awake  ED Course / MDM  EKG:   I have reviewed the labs performed to date as well as medications administered while in observation.  Recent changes in the last 24 hours include initial evaluation by EDP and psychiatry.  Plan  Current plan is for psychiatric placement .  Kenden Reller is not under involuntary commitment.     Freddie Breech, MD 04/01/21 [id number]

## 2021-04-01 NOTE — Tx Team (Signed)
Initial Treatment Plan 04/01/2021 5:51 PM Roger [name] EYE:[id number]    PATIENT STRESSORS: Marital or family conflict   Substance abuse     PATIENT STRENGTHS: Motivation for treatment/growth  Physical Health    PATIENT IDENTIFIED PROBLEMS: Ineffective coping skills  Substance Abuse  Family conflict                 DISCHARGE CRITERIA:  Improved stabilization in mood, thinking, and/or behavior Motivation to continue treatment in a less acute level of care  PRELIMINARY DISCHARGE PLAN: Attend 12-step recovery group Outpatient therapy Participate in family therapy  PATIENT/FAMILY INVOLVEMENT: This treatment plan has been presented to and reviewed with the patient, Roger [name], and/or family member.  The patient and family have been given the opportunity to ask questions and make suggestions.  Sharin Mons, RN 04/01/2021, 5:51 PM

## 2021-04-01 NOTE — BH Assessment (Signed)
Patient is to be admitted to [facility] by Psychiatric Nurse Practitioner Nanine Means.  Attending Physician will be Dr.  Toni Amend .   Patient has been assigned to room 320, by [facility] Charge Nurse [place].   ER staff is aware of the admission

## 2021-04-01 NOTE — ED Notes (Signed)
IVC/Pending Placement 

## 2021-04-01 NOTE — ED Notes (Signed)
Pt discharged to BMU under IVC.  VS stable. Pt cooperative.

## 2021-04-01 NOTE — ED Notes (Signed)
Pt received breakfast tray at this time

## 2021-04-01 NOTE — ED Notes (Signed)
Pt politely told RN, "I don't take medication."  When this writer asked what his reasons were, the pt did not have an answer.

## 2021-04-02 DIAGNOSIS — F3112 Bipolar disorder, current episode manic without psychotic features, moderate: Principal | ICD-10-CM

## 2021-04-02 NOTE — Progress Notes (Signed)
Patient alert and oriented x 4, affect is blunted thoughts are organized and coherent no distress noted, isolates to room except during snack, refused medication regimen he stated " I am good" 15 minutes safety checks maintained will continue to monitor.
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
## 2021-04-02 NOTE — Plan of Care (Signed)
Pt denies depression, anxiety, SI, HI and AVH. Pt was educated on care plan and verbalizes understanding. Torrie Mayers RN Problem: Education: Goal: Knowledge of [place] [facility] Education information/materials will improve Outcome: Progressing Goal: Emotional status will improve Outcome: Progressing Goal: Mental status will improve Outcome: Progressing Goal: Verbalization of understanding the information provided will improve Outcome: Progressing   Problem: Activity: Goal: Interest or engagement in activities will improve Outcome: Progressing Goal: Sleeping patterns will improve Outcome: Progressing   Problem: Coping: Goal: Ability to verbalize frustrations and anger appropriately will improve Outcome: Progressing Goal: Ability to demonstrate self-control will improve Outcome: Progressing   Problem: Health Behavior/Discharge Planning: Goal: Identification of resources available to assist in meeting health care needs will improve Outcome: Progressing Goal: Compliance with treatment plan for underlying cause of condition will improve Outcome: Progressing   Problem: Physical Regulation: Goal: Ability to maintain clinical measurements within normal limits will improve Outcome: Progressing   Problem: Safety: Goal: Periods of time without injury will increase Outcome: Progressing   Problem: Education: Goal: Utilization of techniques to improve thought processes will improve Outcome: Progressing Goal: Knowledge of the prescribed therapeutic regimen will improve Outcome: Progressing   Problem: Activity: Goal: Interest or engagement in leisure activities will improve Outcome: Progressing Goal: Imbalance in normal sleep/wake cycle will improve Outcome: Progressing   Problem: Coping: Goal: Coping ability will improve Outcome: Progressing Goal: Will verbalize feelings Outcome: Progressing   Problem: Health Behavior/Discharge Planning: Goal: Ability to make decisions  will improve Outcome: Progressing Goal: Compliance with therapeutic regimen will improve Outcome: Progressing   Problem: Health Behavior/Discharge Planning: Goal: Ability to make decisions will improve Outcome: Progressing Goal: Compliance with therapeutic regimen will improve Outcome: Progressing   Problem: Role Relationship: Goal: Will demonstrate positive changes in social behaviors and relationships Outcome: Progressing   Problem: Safety: Goal: Ability to disclose and discuss suicidal ideas will improve Outcome: Progressing Goal: Ability to identify and utilize support systems that promote safety will improve Outcome: Progressing   Problem: Self-Concept: Goal: Will verbalize positive feelings about self Outcome: Progressing Goal: Level of anxiety will decrease Outcome: Progressing

## 2021-04-02 NOTE — Progress Notes (Signed)
Pt has been with drawn in his room and refused meds. Pt does not believe that he should be here for care. Torrie Mayers RN

## 2021-04-02 NOTE — Group Note (Signed)
LCSW Group Therapy Note  Group Date: 04/02/2021 Start Time: 1310 End Time: 1355   Type of Therapy and Topic:  Group Therapy - Healthy vs Unhealthy Coping Skills  Participation Level:  Did Not Attend   Description of Group The focus of this group was to determine what unhealthy coping techniques typically are used by group members and what healthy coping techniques would be helpful in coping with various problems. Patients were guided in becoming aware of the differences between healthy and unhealthy coping techniques. Patients were asked to identify 2-3 healthy coping skills they would like to learn to use more effectively.  Therapeutic Goals Patients learned that coping is what human beings do all day long to deal with various situations in their lives Patients defined and discussed healthy vs unhealthy coping techniques Patients identified their preferred coping techniques and identified whether these were healthy or unhealthy Patients determined 2-3 healthy coping skills they would like to become more familiar with and use more often. Patients provided support and ideas to each other   Summary of Patient Progress: Patient did not attend group despite encouraged participation.    Therapeutic Modalities Cognitive Behavioral Therapy Motivational Interviewing  Norberto Sorenson, Theresia Majors 04/02/2021  5:20 PM

## 2021-04-02 NOTE — [facility] Counselor (Signed)
Adult Comprehensive Assessment  Patient ID: Roger [name], male   DOB: 02-Feb-1991, 30 y.o.   MRN: [id number]  Information Source: Information source: Patient  Current Stressors:  Patient states their primary concerns and needs for treatment are:: "My biggest concern is why i'm here because all I did was move a TV. I don't think I should be here for moving a TV." Patient states their goals for this hospitilization and ongoing recovery are:: "Finding a good support team." Educational / Learning stressors: Patient denies Employment / Job issues: Patient quit his job in October because "everyone was going crazy". Patient was unable to provide clear specifics about his job issues but states he has a difficult time getting along with others because "people are crazy". Family Relationships: Patient reports recent conflict with his family members, and reports people are calling him crazy, saying something is wrong with patient. Patient did not disclose who. Financial / Lack of resources (include bankruptcy): Patient denies Housing / Lack of housing: Patient resides with his grandmother. Patient states "I can't deal with the craziness anymore" and plans to move into a tent on gradmother's property. Physical health (include injuries & life threatening diseases): Patient denies Social relationships: Patient reports he feels like everyone is turning against him. Patient reports people who he grew up with are acting like "I shouldn't be here, like I shouldn't be alive." Substance abuse: Patient reports occassional alcohol and marijuana use. Bereavement / Loss: Patient's father passed away 08-24-2019.  Living/Environment/Situation:  Living Arrangements: Other relatives Living conditions (as described by patient or guardian): "We're good." Who else lives in the home?: Patient's Grandmother How long has patient lived in current situation?: "My whole life" What is atmosphere in current home: Other  (Comment) ("Out of hand")  Family History:  Marital status: Single Are you sexually active?: No What is your sexual orientation?: Heterosexual Has your sexual activity been affected by drugs, alcohol, medication, or emotional stress?: Patient denies Does patient have children?: No  Childhood History:  By whom was/is the patient raised?: Other (Comment) Database administrator) Description of patient's relationship with caregiver when they were a child: "We were real close." Patient's description of current relationship with people who raised him/her: "I think I love her too much and thats why i'm acting the way I am right now. She's doing so much for me and I want to help her out." How were you disciplined when you got in trouble as a child/adolescent?: "Yelling, all kinds of stuff. A couple whoopins." Does patient have siblings?: Yes Number of Siblings: 4 (sisters) Description of patient's current relationship with siblings: "We don't talk. They're weird." Did patient suffer any verbal/emotional/physical/sexual abuse as a child?: No Did patient suffer from severe childhood neglect?: No Has patient ever been sexually abused/assaulted/raped as an adolescent or adult?: No Was the patient ever a victim of a crime or a disaster?: No Witnessed domestic violence?: Yes Has patient been affected by domestic violence as an adult?: No Description of domestic violence: "I can't say who."  Education:  Highest grade of school patient has completed: High School Currently a student?: No Learning disability?: No  Employment/Work Situation:   Employment Situation: Unemployed What is the Longest Time Patient has Held a Job?: 2 years Where was the Patient Employed at that Time?: [employer] Has Patient ever Been in the [employer]?: No  Financial Resources:   Financial resources: Support from parents / caregiver Does patient have a Lawyer or guardian?: No  Alcohol/Substance Abuse:  What  has been your use of drugs/alcohol within the last 12 months?: Patient reports occassional marijuana and alcohol use. Patient states he drinks 2-3 shots of liquor at a time 1-2x per month and smokes marijuana 1-2x per week. If attempted suicide, did drugs/alcohol play a role in this?:  (n/a) Alcohol/Substance Abuse Treatment Hx: Past Tx, Inpatient, Attends AA/NA If yes, describe treatment: Patient reports attending substance abuse treatment 2 years ago (2020) but states he stayed 6 out of 21 days because "The people in there were weird". Has alcohol/substance abuse ever caused legal problems?: Yes (DUI at age 22)  Social Support System:   Patient's Community Support System: Production assistant, radio System: "It's good, it's just dealing with the craziness." Type of faith/religion: "It's hard to describe but I was raised in a church" How does patient's faith help to cope with current illness?: Patient states, "it just does"  Leisure/Recreation:   Do You Have Hobbies?: Yes Leisure and Hobbies: "I like to go for walks, fishing"  Strengths/Needs:   What is the patient's perception of their strengths?: "I'm starting to realize i'm not too good at anything, nothing." Patient states they can use these personal strengths during their treatment to contribute to their recovery: "I'm fine." Patient states these barriers may affect/interfere with their treatment: Patient denies Patient states these barriers may affect their return to the community: Patient denies Other important information patient would like considered in planning for their treatment: "Getting on disability."  Discharge Plan:   Currently receiving community mental health services: No Patient states concerns and preferences for aftercare planning are: "No, i'm fine" Patient states they will know when they are safe and ready for discharge when: "I knew that coming in" Does patient have access to transportation?: Yes (Patient  states he has a car that is located at his home) Does patient have financial barriers related to discharge medications?: No Patient description of barriers related to discharge medications: Patient states he is not interested in taking medication at this time. Will patient be returning to same living situation after discharge?: Yes  Summary/Recommendations:   Summary and Recommendations (to be completed by the evaluator): Patient is a 30 year old single male from [place], [place] [facility]). Patient presented to [facility] ED after being IVCd by patients grandmother, with whom patient resides. Per IVC, patient has a history of mental illness and stopped taking his medications. Patient was tearing up several rooms and verbally threatened his grandmother. Patient was admitted with mania symptoms and aggression at home. Patient has a primary diagnosis of Bipolar affective disorder, manic, moderate (HCC). On assessment, patient is oriented x4 and presents with a flat affect and poor insight. Primary stressors include patient report of having trouble getting along with others. Patient reports interpersonal conflict with his gradmother and coworkers before he quit his job 2 months ago. Patient states he was living in a tent on his grandmothers property until recently when he moved back inside due to the weather getting colder. When asked to expand on interpersonal conflict, patient provided vague responses, stating that people are crazy and unpredictable. Patients also reports his father passed away 2019-08-31. Patient does not currently receive mental health services and is not agreeable to psychiatric medication management or therapy at the time of assessment. Patient reports marijuana use 1-2 times per week and alcohol use 2 times per month. Patient has a history of substance abuse but denies current substance abuse issues. Recommendations include: crisis stabilization, therapeutic milieu, encourage group  attendance and participation, medication management for detox/mood stabilization and development of comprehensive mental wellness/sobriety plan.  Ileana Ladd Ellenor Wisniewski. 04/02/2021

## 2021-04-03 NOTE — Plan of Care (Signed)
°  Problem: Education: Goal: Knowledge of [facility] Education information/materials will improve Outcome: Progressing Goal: Emotional status will improve Outcome: Progressing Goal: Mental status will improve Outcome: Progressing Goal: Verbalization of understanding the information provided will improve Outcome: Progressing   Problem: Health Behavior/Discharge Planning: Goal: Identification of resources available to assist in meeting health care needs will improve Outcome: Progressing Goal: Compliance with treatment plan for underlying cause of condition will improve Outcome: Not Progressing   Problem: Coping: Goal: Will verbalize feelings Outcome: Progressing   Problem: Health Behavior/Discharge Planning: Goal: Compliance with therapeutic regimen will improve Outcome: Not Progressing   Problem: Safety: Goal: Ability to identify and utilize support systems that promote safety will improve Outcome: Progressing   Problem: Self-Concept: Goal: Level of anxiety will decrease Outcome: Progressing

## 2021-04-03 NOTE — [facility] Suicide Risk Assessment (Signed)
[facility] INPATIENT:  Family/Significant Other Suicide Prevention Education  Suicide Prevention Education:  Contact Attempts: Ronna Polio (grandmother) [phone], has been identified by the patient as the family member/significant other with whom the patient will be residing, and identified as the person(s) who will aid the patient in the event of a mental health crisis.  With written consent from the patient, two attempts were made to provide suicide prevention education, prior to and/or following the patient's discharge.  We were unsuccessful in providing suicide prevention education.  A suicide education pamphlet was given to the patient to share with family/significant other.  Date and time of first attempt: 04/03/2021 11:34AM Date and time of second attempt: A second attempt will be made at a later time.   This CSW placed call to [facility], call went to voicemail. Left HIPAA compliant voicemail requesting return call.   Ileana Ladd Heaven Meeker 04/03/2021, 11:33 AM

## 2021-04-03 NOTE — Progress Notes (Addendum)
[facility] MD Progress Note  04/03/2021 11:54 AM Lauro Simona Huh Tyrann Donaho  MRN:  [id number] Subjective: Pt seen and chart reviewed.  Jizel Cheeks just repeated what Antolin Belsito said yesterday, that Edgar Reisz was here in the hospital because "I moved the TV" and stated his grandmom is "wrong" about her aggression.  Thus, Delisia Mcquiston sees no reason that Alby Schwabe needs to take meds because there is nothing wrong with him.   RN: no agitation, but refused all meds.   Per SW who spoke with pt's grandmom, pt is not allowed to return to her house due to her fear of her own safety. Pt has been living with her prior to this admission.   Principal Problem: Bipolar affective disorder, currently manic, moderate (HCC) Diagnosis: Principal Problem:   Bipolar affective disorder, currently manic, moderate (HCC)  Total Time spent with patient: 30 minutes  Past Psychiatric History: hx of bipolar and polysubstance use disorder.  Aslee Such was admitted at [facility] in [facility] 2021 and was discharged on Invega sustenna 234mg  IM shot.  And temazepam for sleep.    Past Medical History:  Past Medical History:  Diagnosis Date   Depression    Headache    Substance abuse (HCC)     Past Surgical History:  Procedure Laterality Date   FEMUR FRACTURE SURGERY     Family History:  Family History  Problem Relation Age of Onset   Alcohol abuse Father    Family Psychiatric  History: father has hx of alcohol abuse.  Social History:  Social History   Substance and Sexual Activity  Alcohol Use Yes   Alcohol/week: 1.0 standard drink   Types: 1 Shots of liquor per week     Social History   Substance and Sexual Activity  Drug Use Yes   Types: Amphetamines, Cocaine, Marijuana, Benzodiazepines, MDMA (Ecstacy), Hydrocodone    Social History   Socioeconomic History   Marital status: Single    Spouse name: Not on file   Number of children: Not on file   Years of education: Not on file   Highest education level: Not on file  Occupational History   Not on file   Tobacco Use   Smoking status: Former    Packs/day: 0.50    Years: 10.00    Pack years: 5.00    Types: Cigarettes   Smokeless tobacco: Former    Quit date: 11/24/2014  Vaping Use   Vaping Use: Never used  Substance and Sexual Activity   Alcohol use: Yes    Alcohol/week: 1.0 standard drink    Types: 1 Shots of liquor per week   Drug use: Yes    Types: Amphetamines, Cocaine, Marijuana, Benzodiazepines, MDMA (Ecstacy), Hydrocodone   Sexual activity: Yes    Birth control/protection: None  Other Topics Concern   Not on file  Social History Narrative   Not on file   Social Determinants of Health   Financial Resource Strain: Not on file  Food Insecurity: Not on file  Transportation Needs: Not on file  Physical Activity: Not on file  Stress: Not on file  Social Connections: Not on file   Additional Social History:    Sleep: Fair  Appetite:  Fair  Current Medications: Current Facility-Administered Medications  Medication Dose Route Frequency Provider Last Rate Last Admin   acetaminophen (TYLENOL) tablet 650 mg  650 mg Oral Q6H PRN 01/24/2015, NP       alum & mag hydroxide-simeth (MAALOX/MYLANTA) 200-200-20 MG/5ML suspension 30 mL  30 mL Oral Q4H PRN  Charm Rings, NP       magnesium hydroxide (MILK OF MAGNESIA) suspension 30 mL  30 mL Oral Daily PRN Charm Rings, NP       paliperidone (INVEGA) 24 hr tablet 3 mg  3 mg Oral Daily Charm Rings, NP        Lab Results: No results found for this or any previous visit (from the past 48 hour(s)).  Blood Alcohol level:  Lab Results  Component Value Date   ETH <10 03/31/2021   ETH <10 05/23/2020    Metabolic Disorder Labs: Lab Results  Component Value Date   HGBA1C 5.8 (H) 05/12/2020   MPG 119.76 05/12/2020   MPG 116.89 06/01/2017   Lab Results  Component Value Date   PROLACTIN 25.5 (H) 12/25/2015   Lab Results  Component Value Date   CHOL 150 05/12/2020   TRIG 53 05/12/2020   HDL 58 05/12/2020    CHOLHDL 2.6 05/12/2020   VLDL 11 05/12/2020   LDLCALC 81 05/12/2020   LDLCALC 92 06/01/2017    Physical Findings: AIMS:  , ,  ,  ,    CIWA:    COWS:     Musculoskeletal: Strength & Muscle Tone: within normal limits Gait & Station: normal Patient leans: N/A  Psychiatric Specialty Exam:  Presentation  General Appearance: No data recorded Eye Contact:No data recorded Speech:No data recorded Speech Volume:No data recorded Handedness:No data recorded  Mood and Affect  Mood:No data recorded Affect:No data recorded  Thought Process  Thought Processes:No data recorded Descriptions of Associations:No data recorded Orientation:No data recorded Thought Content:No data recorded History of Schizophrenia/Schizoaffective disorder:No  Duration of Psychotic Symptoms:No data recorded Hallucinations:No data recorded Ideas of Reference:No data recorded Suicidal Thoughts:No data recorded Homicidal Thoughts:No data recorded  Sensorium  Memory:No data recorded Judgment:No data recorded Insight:No data recorded  Executive Functions  Concentration:No data recorded Attention Span:No data recorded Recall:No data recorded Fund of Knowledge:No data recorded Language:No data recorded  Psychomotor Activity  Psychomotor Activity:No data recorded  Assets  Assets:No data recorded  Sleep  Sleep:No data recorded   Physical Exam: Physical Exam ROS Blood pressure 140/88, pulse 78, temperature 98.5 F (36.9 C), temperature source Oral, resp. rate 18, height 5\' 6"  (1.676 m), weight 58.3 kg, SpO2 100 %. Body mass index is 20.74 kg/m.   Treatment Plan Summary: Daily contact with patient to assess and evaluate symptoms and progress in treatment and Medication management  Bipolar affective disorder vs Schizoaffective disorder -- continue Invega 3mg  daily (however pt is refusing.  Armonie Staten used to get 234mg  IM, but last injection was several months ago).    Hx of  polysubstance use disorder  -- currently active using THC.  -- recommend substance abuse counseling.   Jonell Krontz, MD 04/03/2021, 11:54 AM

## 2021-04-03 NOTE — Group Note (Signed)
LCSW Group Therapy Note ° °Group Date: 04/03/2021 °Start Time: 1330 °End Time: 1430 ° ° °Type of Therapy and Topic:  Group Therapy - How To Cope with Nervousness about Discharge  ° °Participation Level:  Did Not Attend  ° °Description of Group °This process group involved identification of patients' feelings about discharge. Some of them are scheduled to be discharged soon, while others are new admissions, but each of them was asked to share thoughts and feelings surrounding discharge from the hospital. One common theme was that they are excited at the prospect of going home, while another was that many of them are apprehensive about sharing why they were hospitalized. Patients were given the opportunity to discuss these feelings with their peers in preparation for discharge. ° °Therapeutic Goals ° °Patient will identify their overall feelings about pending discharge. °Patient will think about how they might proactively address issues that they believe will once again arise once they get home (i.e. with parents). °Patients will participate in discussion about having hope for change. ° ° °Summary of Patient Progress: Patient did not attend group despite encouraged participation. ° ° ° °Therapeutic Modalities °Cognitive Behavioral Therapy ° ° °Gedalya Jim K Shakir Petrosino, LCSWA °04/03/2021  4:37 PM   °

## 2021-04-03 NOTE — [facility] Suicide Risk Assessment (Addendum)
[facility] INPATIENT:  Family/Significant Other Suicide Prevention Education  Suicide Prevention Education:  Family/Significant Other Refusal to Support Patient after Discharge: Ronna Polio (grandmother) [phone], Suicide Prevention Education Not Provided: Patient has identified home of family/significant other as the place the patient will be residing after discharge.  With written consent of the patient, two attempts were made to provide Suicide Prevention Education to  This person indicates he/she will not be responsible for the patient after discharge.   Patient's grandmother, Ronna Polio shares patient has a history of substance use and history of medication noncompliance. Patient's grandmother states that she is afraid for her life and due to patient's recent behavior. Patient's grandmother states patient she is afraid that patient will kill her or kill someone else. Grandmother states prior to calling the police 03/31/21, patient had "torn up (her) home", which patient had never done before. Patient's grandmother states, "If he gets out, there's not telling what he will do. I might not ever get a chance to call the police". Patient's grandmother states patient has been IVC'd in the past and returned home 2-3 days later. Grandmother shares patient has no other support and grandmother is 14 years old and cannot keep caring for patient, stating "if (patient) does not go get some help, he's going to be sleeping on the streets." Physician and CSW's made aware of grandmother's safety concerns.   Crisis resources were provided.   Ileana Ladd Ulrich Soules 04/03/2021,1:04 PM

## 2021-04-03 NOTE — Progress Notes (Signed)
Patient alert and oriented. Patient denies anxiety, depression, SI, HI, and AVH. Patient refused medication for the day.  Q15 minute safety checks maintained. Patient remains safe on the unit at this time.

## 2021-04-04 NOTE — Group Note (Signed)
[facility] LCSW Group Therapy Note   Group Date: 04/04/2021 Start Time: 1300 End Time: 1400   Type of Therapy/Topic:  Group Therapy:  Balance in Life  Participation Level:  Did Not Attend   Description of Group:    This group will address the concept of balance and how it feels and looks when one is unbalanced. Patients will be encouraged to process areas in their lives that are out of balance, and identify reasons for remaining unbalanced. Facilitators will guide patients utilizing problem- solving interventions to address and correct the stressor making their life unbalanced. Understanding and applying boundaries will be explored and addressed for obtaining  and maintaining a balanced life. Patients will be encouraged to explore ways to assertively make their unbalanced needs known to significant others in their lives, using other group members and facilitator for support and feedback.  Therapeutic Goals: Patient will identify two or more emotions or situations they have that consume much of in their lives. Patient will identify signs/triggers that life has become out of balance:  Patient will identify two ways to set boundaries in order to achieve balance in their lives:  Patient will demonstrate ability to communicate their needs through discussion and/or role plays  Summary of Patient Progress: Group not held due to acuity on the unit.     Therapeutic Modalities:   Cognitive Behavioral Therapy Solution-Focused Therapy Assertiveness Training   Jacqulyn Ducking [place], [place]

## 2021-04-04 NOTE — Progress Notes (Signed)
Patient calm and pleasant during assessment denying SI/HI/AVH. Patient observed interacting appropriately with staff and peers on the unit. Pt given education, support, and encouragement to be active in his treatment plan. Pt didn't have any medications scheduled tonight and hasn't requested anything PRN at this time. Pt being monitored Q 15 minutes for safety per unit protocol. Pt remains safe on the unit.

## 2021-04-04 NOTE — Progress Notes (Signed)
Recreation Therapy Notes ° ° °Date: 04/04/2021 ° °Time: 10:15am   ° °Location:  Craft room   ° °Behavioral response: N/A °  °Intervention Topic: Problem Solving  ° °Discussion/Intervention: °Patient did not attend group. ° °Clinical Observations/Feedback:  °Patient did not attend group. °  °Otha Monical LRT/CTRS ° ° ° ° ° ° ° °Ozell Juhasz °04/04/2021 12:12 PM °

## 2021-04-04 NOTE — BH IP Treatment Plan (Signed)
Interdisciplinary Treatment and Diagnostic Plan Update  04/04/2021 Time of Session: 0900 Roger [name] MRN: [id number]  Principal Diagnosis: Bipolar affective disorder, currently manic, moderate ([place])  Secondary Diagnoses: Principal Problem:   Bipolar affective disorder, currently manic, moderate (HCC)   Current Medications:  Current Facility-Administered Medications  Medication Dose Route Frequency Provider Last Rate Last Admin   acetaminophen (TYLENOL) tablet 650 mg  650 mg Oral Q6H PRN Patrecia Pour, NP       alum & mag hydroxide-simeth (MAALOX/MYLANTA) 200-200-20 MG/5ML suspension 30 mL  30 mL Oral Q4H PRN Patrecia Pour, NP       magnesium hydroxide (MILK OF MAGNESIA) suspension 30 mL  30 mL Oral Daily PRN Patrecia Pour, NP       paliperidone (INVEGA) 24 hr tablet 3 mg  3 mg Oral Daily Patrecia Pour, NP       PTA Medications: Medications Prior to Admission  Medication Sig Dispense Refill Last Dose   amoxicillin (AMOXIL) 500 MG tablet Take 500 mg by mouth 4 (four) times daily.      cyclobenzaprine (FLEXERIL) 10 MG tablet Take 1 tablet (10 mg total) by mouth 3 (three) times daily as needed for muscle spasms. (Patient not taking: Reported on 03/31/2021) 30 tablet 0    ibuprofen (ADVIL) 600 MG tablet Take 1 tablet (600 mg total) by mouth every 8 (eight) hours as needed. (Patient not taking: Reported on 03/31/2021) 30 tablet 0    paliperidone (INVEGA SUSTENNA) 234 MG/1.5ML SUSY injection Inject 234 mg into the muscle every 28 (twenty-eight) days. (Patient not taking: Reported on 03/31/2021) 1.8 mL 1    paliperidone (INVEGA) 6 MG 24 hr tablet Take 1 tablet (6 mg total) by mouth daily. (Patient not taking: Reported on 03/31/2021) 30 tablet 1    risperiDONE (RISPERDAL) 1 MG tablet TAKE 2 TABLETS(2MG) BY MOUTH 2 TIMES A DAY FOR SCHIZPHRENIA (Patient not taking: Reported on 03/31/2021) 120 tablet 0    sertraline (ZOLOFT) 100 MG tablet TAKE ONE TABLET BY MOUTH AT BEDTIME FOR  DERESSION 30 tablet 0    temazepam (RESTORIL) 15 MG capsule Take 1 capsule (15 mg total) by mouth at bedtime as needed for sleep. (Patient not taking: Reported on 03/31/2021) 30 capsule 0     Patient Stressors: Marital or family conflict   Substance abuse    Patient Strengths: Motivation for treatment/growth  Physical Health   Treatment Modalities: Medication Management, Group therapy, Case management,  1 to 1 session with clinician, Psychoeducation, Recreational therapy.   Physician Treatment Plan for Primary Diagnosis: Bipolar affective disorder, currently manic, moderate ([place]) Long Term Goal(s): Improvement in symptoms so as ready for discharge   Short Term Goals: Ability to identify changes in lifestyle to reduce recurrence of condition will improve Ability to verbalize feelings will improve Ability to disclose and discuss suicidal ideas Ability to demonstrate self-control will improve Ability to identify and develop effective coping behaviors will improve Ability to maintain clinical measurements within normal limits will improve Compliance with prescribed medications will improve Ability to identify triggers associated with substance abuse/mental health issues will improve  Medication Management: Evaluate patient's response, side effects, and tolerance of medication regimen.  Therapeutic Interventions: 1 to 1 sessions, Unit Group sessions and Medication administration.  Evaluation of Outcomes: Not Met  Physician Treatment Plan for Secondary Diagnosis: Principal Problem:   Bipolar affective disorder, currently manic, moderate ([place])  Long Term Goal(s): Improvement in symptoms so as ready for discharge   Short Term  Goals: Ability to identify changes in lifestyle to reduce recurrence of condition will improve Ability to verbalize feelings will improve Ability to disclose and discuss suicidal ideas Ability to demonstrate self-control will improve Ability to identify and  develop effective coping behaviors will improve Ability to maintain clinical measurements within normal limits will improve Compliance with prescribed medications will improve Ability to identify triggers associated with substance abuse/mental health issues will improve     Medication Management: Evaluate patient's response, side effects, and tolerance of medication regimen.  Therapeutic Interventions: 1 to 1 sessions, Unit Group sessions and Medication administration.  Evaluation of Outcomes: Not Met   RN Treatment Plan for Primary Diagnosis: Bipolar affective disorder, currently manic, moderate ([place]) Long Term Goal(s): Knowledge of disease and therapeutic regimen to maintain health will improve  Short Term Goals: Ability to remain free from injury will improve, Ability to verbalize frustration and anger appropriately will improve, Ability to demonstrate self-control, Ability to participate in decision making will improve, Ability to verbalize feelings will improve, Ability to disclose and discuss suicidal ideas, Ability to identify and develop effective coping behaviors will improve, and Compliance with prescribed medications will improve  Medication Management: RN will administer medications as ordered by provider, will assess and evaluate patient's response and provide education to patient for prescribed medication. RN will report any adverse and/or side effects to prescribing provider.  Therapeutic Interventions: 1 on 1 counseling sessions, Psychoeducation, Medication administration, Evaluate responses to treatment, Monitor vital signs and CBGs as ordered, Perform/monitor CIWA, COWS, AIMS and Fall Risk screenings as ordered, Perform wound care treatments as ordered.  Evaluation of Outcomes: Not Met   LCSW Treatment Plan for Primary Diagnosis: Bipolar affective disorder, currently manic, moderate ([place]) Long Term Goal(s): Safe transition to appropriate next level of care at discharge,  Engage patient in therapeutic group addressing interpersonal concerns.  Short Term Goals: Engage patient in aftercare planning with referrals and resources, Increase social support, Increase ability to appropriately verbalize feelings, Increase emotional regulation, Facilitate acceptance of mental health diagnosis and concerns, Facilitate patient progression through stages of change regarding substance use diagnoses and concerns, Identify triggers associated with mental health/substance abuse issues, and Increase skills for wellness and recovery  Therapeutic Interventions: Assess for all discharge needs, 1 to 1 time with Social worker, Explore available resources and support systems, Assess for adequacy in community support network, Educate family and significant other(s) on suicide prevention, Complete Psychosocial Assessment, Interpersonal group therapy.  Evaluation of Outcomes: Not Met   Progress in Treatment: Attending groups: No. Participating in groups: No. Taking medication as prescribed: No. Toleration medication: No. Family/Significant other contact made: Yes, individual(s) contacted:  SPE completed with grandmother Audelia Hives.  Patient understands diagnosis: No. Discussing patient identified problems/goals with staff: Yes. Medical problems stabilized or resolved: Yes. Denies suicidal/homicidal ideation: Yes. Issues/concerns per patient self-inventory: Yes. Other: none   New problem(s) identified: Yes, Describe:  Patient reports he is recently homeless, currently weather is below freezing at night. Patient appears unbothered by potential risk.   Patient exhibits paranoia, states "It is like they are out to get me, randomly they are like hey I am about to kill you."   New Short Term/Long Term Goal(s): Patient to work toward detox, elimination of symptoms of psychosis, medication management for mood stabilization; elimination of SI thoughts; development of comprehensive mental  wellness/sobriety plan.  Patient Goals: "I moved the TV from one room to another . . . I should have asked her . . . It is hard  to let go of the only support of my life. . . I would rather not be around people.   Discharge Plan or Barriers: CSW to discuss housing with patient.   Reason for Continuation of Hospitalization: Aggression Other; describe Substance Use, Paranoia   Estimated Length of Stay: TBD   Scribe for Treatment Team: Larose Kells 04/04/2021 10:13 AM

## 2021-04-04 NOTE — Progress Notes (Signed)
[facility] MD Progress Note  04/04/2021 11:28 AM Roger [name]  MRN:  [id number]  Subjective: Pt seen and chart reviewed, evaluated in treatment team.  He presents with a blunt affect, anxious mood.  Minimizes his actions prior to admission by stating he moved his dad's things which upset his grandmother who is 58 since his father passed last year.  However, his grandmother reports he destroyed the house prior to admission and cannot return as she is afraid of him.  On assessment today, he states, "I don't need medicine. No one's shoving things down my throat."  Reports his sleep, appetite, and mood are good.  Positive for paranoia based on the following:  "I'm fine being by myself.  I'd rather not be around people, everybody acting weird lately.  People acting like they are out to get me."  We discussed going to a shelter or [facility] in [place], refused as he does not want to be around people.  He does agree to attend therapy as his grandmother is his only support person.  Despite being told how cold it is outside, he continues to state he will live outside.  We will continue to encourage medications and evaluate his progress.  Per SW who spoke with pt's grandmom, pt is not allowed to return to her house due to her fear of her own safety. Pt has been living with her prior to this admission.   Principal Problem: Bipolar affective disorder, manic, moderate (HCC) Diagnosis: Principal Problem:   Bipolar affective disorder, manic, moderate (HCC) Active Problems:   Bipolar affective disorder, currently manic, moderate (HCC)  Total Time spent with patient: 30 minutes  Past Psychiatric History: hx of bipolar and polysubstance use disorder.  He was admitted at [facility] in [facility] 2021 and was discharged on Invega sustenna 234mg  IM shot.  And temazepam for sleep.    Past Medical History:  Past Medical History:  Diagnosis Date   Depression    Headache    Substance abuse (HCC)     Past Surgical History:   Procedure Laterality Date   FEMUR FRACTURE SURGERY     Family History:  Family History  Problem Relation Age of Onset   Alcohol abuse Father    Family Psychiatric  History: father has hx of alcohol abuse.  Social History:  Social History   Substance and Sexual Activity  Alcohol Use Yes   Alcohol/week: 1.0 standard drink   Types: 1 Shots of liquor per week     Social History   Substance and Sexual Activity  Drug Use Yes   Types: Amphetamines, Cocaine, Marijuana, Benzodiazepines, MDMA (Ecstacy), Hydrocodone    Social History   Socioeconomic History   Marital status: Single    Spouse name: Not on file   Number of children: Not on file   Years of education: Not on file   Highest education level: Not on file  Occupational History   Not on file  Tobacco Use   Smoking status: Former    Packs/day: 0.50    Years: 10.00    Pack years: 5.00    Types: Cigarettes   Smokeless tobacco: Former    Quit date: 11/24/2014  Vaping Use   Vaping Use: Never used  Substance and Sexual Activity   Alcohol use: Yes    Alcohol/week: 1.0 standard drink    Types: 1 Shots of liquor per week   Drug use: Yes    Types: Amphetamines, Cocaine, Marijuana, Benzodiazepines, MDMA (Ecstacy), Hydrocodone   Sexual  activity: Yes    Birth control/protection: None  Other Topics Concern   Not on file  Social History Narrative   Not on file   Social Determinants of Health   Financial Resource Strain: Not on file  Food Insecurity: Not on file  Transportation Needs: Not on file  Physical Activity: Not on file  Stress: Not on file  Social Connections: Not on file   Additional Social History:    Sleep: Fair  Appetite:  Fair  Current Medications: Current Facility-Administered Medications  Medication Dose Route Frequency Provider Last Rate Last Admin   acetaminophen (TYLENOL) tablet 650 mg  650 mg Oral Q6H PRN Charm Rings, NP       alum & mag hydroxide-simeth (MAALOX/MYLANTA) 200-200-20  MG/5ML suspension 30 mL  30 mL Oral Q4H PRN Charm Rings, NP       magnesium hydroxide (MILK OF MAGNESIA) suspension 30 mL  30 mL Oral Daily PRN Charm Rings, NP       paliperidone (INVEGA) 24 hr tablet 3 mg  3 mg Oral Daily Charm Rings, NP        Lab Results: No results found for this or any previous visit (from the past 48 hour(s)).  Blood Alcohol level:  Lab Results  Component Value Date   ETH <10 03/31/2021   ETH <10 05/23/2020    Metabolic Disorder Labs: Lab Results  Component Value Date   HGBA1C 5.8 (H) 05/12/2020   MPG 119.76 05/12/2020   MPG 116.89 06/01/2017   Lab Results  Component Value Date   PROLACTIN 25.5 (H) 12/25/2015   Lab Results  Component Value Date   CHOL 150 05/12/2020   TRIG 53 05/12/2020   HDL 58 05/12/2020   CHOLHDL 2.6 05/12/2020   VLDL 11 05/12/2020   LDLCALC 81 05/12/2020   LDLCALC 92 06/01/2017    Musculoskeletal: Strength & Muscle Tone: within normal limits Gait & Station: normal Patient leans: N/A  Psychiatric Specialty Exam: Physical Exam Vitals and nursing note reviewed.  Constitutional:      Appearance: Normal appearance.  HENT:     Head: Normocephalic.     Nose: Nose normal.  Pulmonary:     Effort: Pulmonary effort is normal.  Musculoskeletal:        General: Normal range of motion.     Cervical back: Normal range of motion.  Neurological:     General: No focal deficit present.     Mental Status: He is alert and oriented to person, place, and time.  Psychiatric:        Attention and Perception: Attention and perception normal.        Mood and Affect: Mood is anxious. Affect is blunt.        Speech: Speech normal.        Behavior: Behavior normal. Behavior is cooperative.        Thought Content: Thought content is paranoid.        Cognition and Memory: Cognition is impaired.        Judgment: Judgment normal.    Review of Systems  Psychiatric/Behavioral:  The patient is nervous/anxious.   All other systems  reviewed and are negative.  Blood pressure 121/88, pulse 73, temperature 98.3 F (36.8 C), temperature source Oral, resp. rate 18, height 5\' 6"  (1.676 m), weight 58.3 kg, SpO2 99 %.Body mass index is 20.74 kg/m.  General Appearance: Casual  Eye Contact:  Good  Speech:  WDL  Volume:  Normal  Mood:  Anxious  Affect:  Blunt  Thought Process:  Coherent and Descriptions of Associations: Intact  Orientation:  Full (Time, Place, and Person)  Thought Content:  Paranoid Ideation  Suicidal Thoughts:  No  Homicidal Thoughts:  No  Memory:  Immediate;   Good Recent;   Fair Remote;   Good  Judgement:  Fair  Insight:  Lacking  Psychomotor Activity:  Normal  Concentration:  Concentration: Good and Attention Span: Good  Recall:  Good  Fund of Knowledge:  Fair  Language:  Good  Akathisia:  No  Handed:  Right  AIMS (if indicated):     Assets:  Leisure Time Physical Health Resilience  ADL's:  Intact  Cognition:  WNL  Sleep:  Number of Hours: 7.25      Physical Exam: Physical Exam Vitals and nursing note reviewed.  Constitutional:      Appearance: Normal appearance.  HENT:     Head: Normocephalic.     Nose: Nose normal.  Pulmonary:     Effort: Pulmonary effort is normal.  Musculoskeletal:        General: Normal range of motion.     Cervical back: Normal range of motion.  Neurological:     General: No focal deficit present.     Mental Status: He is alert and oriented to person, place, and time.  Psychiatric:        Attention and Perception: Attention and perception normal.        Mood and Affect: Mood is anxious. Affect is blunt.        Speech: Speech normal.        Behavior: Behavior normal. Behavior is cooperative.        Thought Content: Thought content is paranoid.        Cognition and Memory: Cognition is impaired.        Judgment: Judgment normal.   Review of Systems  Psychiatric/Behavioral:  The patient is nervous/anxious.   All other systems reviewed and are  negative. Blood pressure 121/88, pulse 73, temperature 98.3 F (36.8 C), temperature source Oral, resp. rate 18, height 5\' 6"  (1.676 m), weight 58.3 kg, SpO2 99 %. Body mass index is 20.74 kg/m.   Treatment Plan Summary: Daily contact with patient to assess and evaluate symptoms and progress in treatment and Medication management  Bipolar affective disorder vs Schizoaffective disorder -- continue Invega 3mg  daily (however pt is refusing.  He used to get 234mg  IM, but last injection was several months ago).    Hx of polysubstance use disorder  -- currently actively using THC.  -- recommend substance abuse counseling.   , NP 04/04/2021, 11:28 AM

## 2021-04-04 NOTE — Plan of Care (Signed)
°  Problem: Education: Goal: Knowledge of [facility] Education information/materials will improve 04/04/2021 0452 by Sharene Skeans, LPN Outcome: Progressing 04/04/2021 0449 by Butler-Nicholson, Kaimani Clayson L, LPN Outcome: Progressing Goal: Emotional status will improve 04/04/2021 0452 by Butler-Nicholson, Mauria Asquith L, LPN Outcome: Progressing 04/04/2021 0449 by Butler-Nicholson, Shannan Slinker L, LPN Outcome: Progressing Goal: Mental status will improve 04/04/2021 0452 by Butler-Nicholson, Canda Podgorski L, LPN Outcome: Progressing 04/04/2021 0449 by Butler-Nicholson, Moise Friday L, LPN Outcome: Progressing Goal: Verbalization of understanding the information provided will improve 04/04/2021 0452 by Butler-Nicholson, Delinda Malan L, LPN Outcome: Progressing 04/04/2021 0449 by Butler-Nicholson, Parminder Cupples L, LPN Outcome: Progressing   Problem: Activity: Goal: Interest or engagement in activities will improve 04/04/2021 0452 by Butler-Nicholson, Reata Petrov L, LPN Outcome: Progressing 04/04/2021 0449 by Butler-Nicholson, Adellyn Capek L, LPN Outcome: Progressing Goal: Sleeping patterns will improve 04/04/2021 0452 by Butler-Nicholson, Jaspreet Bodner L, LPN Outcome: Progressing 04/04/2021 0449 by Butler-Nicholson, Curby Carswell L, LPN Outcome: Progressing   Problem: Activity: Goal: Sleeping patterns will improve 04/04/2021 0452 by Butler-Nicholson, Arieanna Pressey L, LPN Outcome: Progressing 04/04/2021 0449 by Butler-Nicholson, Zayyan Mullen L, LPN Outcome: Progressing

## 2021-04-04 NOTE — Plan of Care (Signed)
Pt denies depression, anxiety, SI, HI and AVH. Pt was educated on care plan and verbalizes understanding. Torrie Mayers RN Problem: Education: Goal: Knowledge of [facility] Education information/materials will improve Outcome: Progressing Goal: Emotional status will improve Outcome: Progressing Goal: Mental status will improve Outcome: Progressing Goal: Verbalization of understanding the information provided will improve Outcome: Progressing   Problem: Activity: Goal: Interest or engagement in activities will improve Outcome: Not Progressing Goal: Sleeping patterns will improve Outcome: Progressing   Problem: Coping: Goal: Ability to verbalize frustrations and anger appropriately will improve Outcome: Progressing Goal: Ability to demonstrate self-control will improve Outcome: Progressing   Problem: Health Behavior/Discharge Planning: Goal: Identification of resources available to assist in meeting health care needs will improve Outcome: Progressing Goal: Compliance with treatment plan for underlying cause of condition will improve Outcome: Progressing   Problem: Physical Regulation: Goal: Ability to maintain clinical measurements within normal limits will improve Outcome: Progressing   Problem: Safety: Goal: Periods of time without injury will increase Outcome: Progressing   Problem: Education: Goal: Utilization of techniques to improve thought processes will improve Outcome: Progressing Goal: Knowledge of the prescribed therapeutic regimen will improve Outcome: Progressing   Problem: Activity: Goal: Interest or engagement in leisure activities will improve Outcome: Progressing Goal: Imbalance in normal sleep/wake cycle will improve Outcome: Progressing   Problem: Coping: Goal: Coping ability will improve Outcome: Progressing Goal: Will verbalize feelings Outcome: Progressing   Problem: Health Behavior/Discharge Planning: Goal: Ability to make decisions  will improve Outcome: Progressing Goal: Compliance with therapeutic regimen will improve Outcome: Progressing   Problem: Role Relationship: Goal: Will demonstrate positive changes in social behaviors and relationships Outcome: Progressing   Problem: Safety: Goal: Ability to disclose and discuss suicidal ideas will improve Outcome: Progressing Goal: Ability to identify and utilize support systems that promote safety will improve Outcome: Progressing   Problem: Self-Concept: Goal: Will verbalize positive feelings about self Outcome: Progressing Goal: Level of anxiety will decrease Outcome: Progressing

## 2021-04-04 NOTE — Progress Notes (Signed)
Patient has been isolative to his room. He does come out for snack and is reserved around both staff and other patients. He denies si hi avh depression and anxiety at this encounter. He as minimal engagement in conversation. No eye contact when talking, with soft low voice. Ask " why they trying to keep me here" Was not receptive to talk about complying with treatment. Shakes his head and walks away. Will continue to monitor with q 15 minute safety checks.    Rosalie Doctor, LPN

## 2021-04-04 NOTE — Progress Notes (Signed)
Recreation Therapy Notes  INPATIENT RECREATION TR PLAN  Patient Details Name: [name] MRN: [id number] DOB: Oct 09, 1990 Today's Date: 04/04/2021  Rec Therapy Plan Is patient appropriate for Therapeutic Recreation?: Yes Treatment times per week: at least 3 Estimated Length of Stay: 5-7 days TR Treatment/Interventions: Group participation (Comment)  Discharge Criteria Pt will be discharged from therapy if:: Discharged Treatment plan/goals/alternatives discussed and agreed upon by:: Patient/family  Discharge Summary     Roger [name] 04/04/2021, 4:03 PM

## 2021-04-04 NOTE — Progress Notes (Signed)
Recreation Therapy Notes  INPATIENT RECREATION THERAPY ASSESSMENT  Patient Details Name: Tramon Crescenzo MRN: [id number] DOB: 1991/03/17 Today's Date: 04/04/2021       Information Obtained From: Patient  Able to Participate in Assessment/Interview: Yes  Patient Presentation: Responsive  Reason for Admission (Per Patient): Active Symptoms  Patient Stressors: Family  Coping Skills:   Music, Exercise  Leisure Interests (2+):  Exercise - Walking, Actuary of Recreation/Participation: Pharmacist, community Resources:  No  Community Resources:     Current Use: No  If no, Barriers?:    Expressed Interest in [employer] Information: No  [place]  Patient Main Form of Transportation: Set designer  Patient Strengths:  Nothing  Patient Identified Areas of Improvement:  N/A  Patient Goal for Hospitalization:  Getting out  Current SI (including self-harm):  No  Current HI:  No  Current AVH: No  Staff Intervention Plan: Group Attendance, Collaborate with Interdisciplinary Treatment Team  Consent to Intern Participation: N/A  Kelce Bouton 04/04/2021, 4:03 PM

## 2021-04-04 NOTE — Plan of Care (Signed)
  Problem: Education: Goal: Knowledge of [facility] Education information/materials will improve Outcome: Progressing Goal: Emotional status will improve Outcome: Progressing Goal: Mental status will improve Outcome: Progressing Goal: Verbalization of understanding the information provided will improve Outcome: Progressing   Problem: Activity: Goal: Interest or engagement in activities will improve Outcome: Progressing Goal: Sleeping patterns will improve Outcome: Progressing   Problem: Coping: Goal: Ability to verbalize frustrations and anger appropriately will improve Outcome: Progressing Goal: Ability to demonstrate self-control will improve Outcome: Progressing   

## 2021-04-04 NOTE — Progress Notes (Signed)
Pt seems to be doing better. He says that he will start being med compliant tomorrow. Pt is calm. Torrie Mayers RN

## 2021-04-05 LAB — LIPID PANEL
Cholesterol: 159 mg/dL (ref 0–200)
HDL: 59 mg/dL (ref >40)
LDL Cholesterol: 86 mg/dL (ref 0–99)
Total CHOL/HDL Ratio: 2.7 RATIO
Triglycerides: 70 mg/dL (ref <150)
VLDL: 14 mg/dL (ref 0–40)

## 2021-04-05 NOTE — Progress Notes (Signed)
[facility] MD Progress Note  04/05/2021 7:06 AM Roger [name]  MRN:  [id number]  Subjective: Pt seen and chart reviewed.  He took his medication this morning and denies side effects.  Pleasant and appears calmer.  Minimizes his symptoms.  No issues on the unit, cooperative.  Sleep and appetite are "good", denies depression, anxiety, and psychosis.  Per SW who spoke with pt's grandmom, pt is not allowed to return to her house due to her fear of her own safety. Pt has been living with her prior to this admission.   Principal Problem: Bipolar affective disorder, manic, moderate (HCC) Diagnosis: Principal Problem:   Bipolar affective disorder, manic, moderate (HCC) Active Problems:   Bipolar affective disorder, currently manic, moderate (HCC)  Total Time spent with patient: 15 minutes  Past Psychiatric History: hx of bipolar and polysubstance use disorder.  He was admitted at [facility] in [facility] 2021 and was discharged on Invega sustenna 234mg  IM shot.  And temazepam for sleep.    Past Medical History:  Past Medical History:  Diagnosis Date   Depression    Headache    Substance abuse (HCC)     Past Surgical History:  Procedure Laterality Date   FEMUR FRACTURE SURGERY     Family History:  Family History  Problem Relation Age of Onset   Alcohol abuse Father    Family Psychiatric  History: father has hx of alcohol abuse.  Social History:  Social History   Substance and Sexual Activity  Alcohol Use Yes   Alcohol/week: 1.0 standard drink   Types: 1 Shots of liquor per week     Social History   Substance and Sexual Activity  Drug Use Yes   Types: Amphetamines, Cocaine, Marijuana, Benzodiazepines, MDMA (Ecstacy), Hydrocodone    Social History   Socioeconomic History   Marital status: Single    Spouse name: Not on file   Number of children: Not on file   Years of education: Not on file   Highest education level: Not on file  Occupational History   Not on file  Tobacco Use    Smoking status: Former    Packs/day: 0.50    Years: 10.00    Pack years: 5.00    Types: Cigarettes   Smokeless tobacco: Former    Quit date: 11/24/2014  Vaping Use   Vaping Use: Never used  Substance and Sexual Activity   Alcohol use: Yes    Alcohol/week: 1.0 standard drink    Types: 1 Shots of liquor per week   Drug use: Yes    Types: Amphetamines, Cocaine, Marijuana, Benzodiazepines, MDMA (Ecstacy), Hydrocodone   Sexual activity: Yes    Birth control/protection: None  Other Topics Concern   Not on file  Social History Narrative   Not on file   Social Determinants of Health   Financial Resource Strain: Not on file  Food Insecurity: Not on file  Transportation Needs: Not on file  Physical Activity: Not on file  Stress: Not on file  Social Connections: Not on file   Additional Social History:    Sleep: Fair  Appetite:  Fair  Current Medications: Current Facility-Administered Medications  Medication Dose Route Frequency Provider Last Rate Last Admin   acetaminophen (TYLENOL) tablet 650 mg  650 mg Oral Q6H PRN 01/24/2015, NP       alum & mag hydroxide-simeth (MAALOX/MYLANTA) 200-200-20 MG/5ML suspension 30 mL  30 mL Oral Q4H PRN 10-24-2000, NP  magnesium hydroxide (MILK OF MAGNESIA) suspension 30 mL  30 mL Oral Daily PRN Patrecia Pour, NP       paliperidone (INVEGA) 24 hr tablet 3 mg  3 mg Oral Daily Patrecia Pour, NP        Lab Results: No results found for this or any previous visit (from the past 48 hour(s)).  Blood Alcohol level:  Lab Results  Component Value Date   ETH <10 03/31/2021   ETH <10 0000000    Metabolic Disorder Labs: Lab Results  Component Value Date   HGBA1C 5.8 (H) 05/12/2020   MPG 119.76 05/12/2020   MPG 116.89 06/01/2017   Lab Results  Component Value Date   PROLACTIN 25.5 (H) 12/25/2015   Lab Results  Component Value Date   CHOL 150 05/12/2020   TRIG 53 05/12/2020   HDL 58 05/12/2020   CHOLHDL 2.6  05/12/2020   VLDL 11 05/12/2020   LDLCALC 81 05/12/2020   LDLCALC 92 06/01/2017    Musculoskeletal: Strength & Muscle Tone: within normal limits Gait & Station: normal Patient leans: N/A  Psychiatric Specialty Exam: Physical Exam Vitals and nursing note reviewed.  Constitutional:      Appearance: Normal appearance.  HENT:     Head: Normocephalic.     Nose: Nose normal.  Pulmonary:     Effort: Pulmonary effort is normal.  Musculoskeletal:        General: Normal range of motion.     Cervical back: Normal range of motion.  Neurological:     General: No focal deficit present.     Mental Status: He is alert and oriented to person, place, and time.  Psychiatric:        Attention and Perception: Attention and perception normal.        Mood and Affect: Mood is anxious. Affect is blunt.        Speech: Speech normal.        Behavior: Behavior normal. Behavior is cooperative.        Thought Content: Thought content is paranoid.        Cognition and Memory: Cognition is impaired.        Judgment: Judgment normal.    Review of Systems  Psychiatric/Behavioral:  The patient is nervous/anxious.   All other systems reviewed and are negative.  Blood pressure 119/77, pulse 68, temperature 98.3 F (36.8 C), temperature source Oral, resp. rate 18, height 5\' 6"  (1.676 m), weight 58.3 kg, SpO2 99 %.Body mass index is 20.74 kg/m.  General Appearance: Casual  Eye Contact:  Good  Speech:  WDL  Volume:  Normal  Mood:  Anxious  Affect:  Blunt  Thought Process:  Coherent and Descriptions of Associations: Intact  Orientation:  Full (Time, Place, and Person)  Thought Content:  Paranoid Ideation  Suicidal Thoughts:  No  Homicidal Thoughts:  No  Memory:  Immediate;   Good Recent;   Fair Remote;   Good  Judgement:  Fair  Insight:  Lacking  Psychomotor Activity:  Normal  Concentration:  Concentration: Good and Attention Span: Good  Recall:  Good  Fund of Knowledge:  Fair  Language:  Good   Akathisia:  No  Handed:  Right  AIMS (if indicated):     Assets:  Leisure Time Physical Health Resilience  ADL's:  Intact  Cognition:  WNL  Sleep:  Number of Hours: 8.5      Physical Exam: Physical Exam Vitals and nursing note reviewed.  Constitutional:  Appearance: Normal appearance.  HENT:     Head: Normocephalic.     Nose: Nose normal.  Pulmonary:     Effort: Pulmonary effort is normal.  Musculoskeletal:        General: Normal range of motion.     Cervical back: Normal range of motion.  Neurological:     General: No focal deficit present.     Mental Status: He is alert and oriented to person, place, and time.  Psychiatric:        Attention and Perception: Attention and perception normal.        Mood and Affect: Mood is anxious. Affect is blunt.        Speech: Speech normal.        Behavior: Behavior normal. Behavior is cooperative.        Thought Content: Thought content is paranoid.        Cognition and Memory: Cognition is impaired.        Judgment: Judgment normal.   Review of Systems  Psychiatric/Behavioral:  The patient is nervous/anxious.   All other systems reviewed and are negative. Blood pressure 119/77, pulse 68, temperature 98.3 F (36.8 C), temperature source Oral, resp. rate 18, height 5\' 6"  (1.676 m), weight 58.3 kg, SpO2 99 %. Body mass index is 20.74 kg/m.   Treatment Plan Summary: Daily contact with patient to assess and evaluate symptoms and progress in treatment and Medication management  Bipolar affective disorder vs Schizoaffective disorder -- continue Invega 3mg  daily (however pt is refusing.  He used to get [place] 234mg  IM, but last injection was several months ago).    Hx of polysubstance use disorder  -- currently actively using THC.  -- recommend substance abuse counseling.   Waylan Boga, NP 04/05/2021, 7:06 AM

## 2021-04-05 NOTE — Plan of Care (Signed)
Patient during assessments this morning observed in his room sleeping in bed just prior to our engagement. Pt. During our engagement endorsed a normal mood and affect was euthymic to relaxed. Pt. Denied si/hi/avh and endorsed ability to continue to remain safe on the unit. Pt. Orientation appeared grossly intact. Pt. Denied physical pain and endorsed he is amenable to taking his medications today. Pt. Endorsed no problems with sleeping and or eating at this time. Pt. Had no complaints for this Clinical research associate. Pt. Did not appear to be RTIS. Pt. Thought process presented short, vague, and overall superficial in nature, but the patient was polite and attentive.   Patient has been complaint with medications and unit procedures thus far with direction and encouragement. Pt. Has been observed eating good thus far, observed up to eat breakfast. Pt. Has been able to remain safe on the unit thus far. Pt. Outside of breakfast has been primarily observed isolative to room resting in bed.   Q x 15 minute observation checks in place/maintained for safety. Patient is provided with education throughout shift when appropriate and able.  Patient is given/offered medications per orders. Patient is encouraged to attend groups, participate in unit activities and continue with plan of care. Pt. Chart and plans of care reviewed. Pt. Given support and encouragement when appropriate and able.      Problem: Education: Goal: Knowledge of [place] [facility] Education information/materials will improve Outcome: Progressing Note: Pt. Verbalized understanding of information provided.  Goal: Emotional status will improve Outcome: Progressing Note: Pt. Endorsed doing better today then yesterday.  Goal: Mental status will improve Outcome: Progressing Note: Pt. Denied si/hi/avh, endorsed an ability to continue to remain safe on the unit, and expressed acceptance of medications. Pt. Did not appear to be RTIS.  Problem: Coping: Goal:  Ability to demonstrate self-control will improve Outcome: Progressing Note: Pt. Has not refused medications or had any behavioral outbursts thus far.  Problem: Health Behavior/Discharge Planning: Goal: Compliance with treatment plan for underlying cause of condition will improve Outcome: Progressing Note: Pt. Has been complaint with medications.

## 2021-04-05 NOTE — Progress Notes (Signed)
Patient calm and pleasant during assessment denying SI/HI/AVH. Patient observed interacting appropriately with staff and peers on the unit. Pt given education, support, and encouragement to be active in his treatment plan. Pt didn't have any medications scheduled tonight and hasn't requested anything PRN at this time. Pt being monitored Q 15 minutes for safety per unit protocol. Pt remains safe on the unit.

## 2021-04-05 NOTE — Group Note (Signed)
LCSW Group Therapy Note ° ° °Group Date: 04/05/2021 °Start Time: 1300 °End Time: 1400 ° ° °Type of Therapy and Topic:  Group Therapy: Boundaries ° °Participation Level:  Did Not Attend ° °Description of Group: °This group will address the use of boundaries in their personal lives. Patients will explore why boundaries are important, the difference between healthy and unhealthy boundaries, and negative and postive outcomes of different boundaries and will look at how boundaries can be crossed.  Patients will be encouraged to identify current boundaries in their own lives and identify what kind of boundary is being set. Facilitators will guide patients in utilizing problem-solving interventions to address and correct types boundaries being used and to address when no boundary is being used. Understanding and applying boundaries will be explored and addressed for obtaining and maintaining a balanced life. Patients will be encouraged to explore ways to assertively make their boundaries and needs known to significant others in their lives, using other group members and facilitator for role play, support, and feedback. ° °Therapeutic Goals: ° °1.  Patient will identify areas in their life where setting clear boundaries could be  used to improve their life.  °2.  Patient will identify signs/triggers that a boundary is not being respected. °3.  Patient will identify two ways to set boundaries in order to achieve balance in  their lives: °4.  Patient will demonstrate ability to communicate their needs and set boundaries  through discussion and/or role plays ° °Summary of Patient Progress:   °Group not held due to acuity on the unit.  ° ° °Therapeutic Modalities:   °Cognitive Behavioral Therapy °Solution-Focused Therapy ° °Pierina Schuknecht W Shaughn Thomley, LCSWA °04/05/2021  4:09 PM   ° °

## 2021-04-06 MED ORDER — PALIPERIDONE ER 3 MG PO TB24
6.0000 mg | ORAL_TABLET | Freq: Every day | ORAL | Status: DC
  Administered 2021-04-07 – 2021-04-08 (×2): 6 mg via ORAL
  Filled 2021-04-06 (×2): qty 2

## 2021-04-06 NOTE — Progress Notes (Signed)
Pt denies SI/HI/AVH and verbally agrees to approach staff if these become apparent or before harming themselves/others. Rates depression 0/10. Rates anxiety 0/10. Rates pain 0/10.  Pt was very calm and cooperative but minimal. Pt was found in his room standing facing the door. Pt willing to take medication. Scheduled medications administered to pt, per MD orders. RN provided support and encouragement to pt. Q15 min safety checks implemented and continued. Pt safe on the unit. RN will continue to monitor and intervene as needed.    04/06/21 0844  Psych Admission Type (Psych Patients Only)  Admission Status Involuntary  Psychosocial Assessment  Patient Complaints None  Eye Contact Fair  Facial Expression Flat  Affect Appropriate to circumstance  Speech Logical/coherent  Interaction Assertive;Minimal  Motor Activity Slow  Appearance/Hygiene In scrubs  Behavior Characteristics Cooperative;Calm;Appropriate to situation  Mood Pleasant  Aggressive Behavior  Effect No apparent injury  Thought Process  Coherency WDL  Content WDL  Delusions None reported or observed  Perception WDL  Hallucination None reported or observed  Judgment Impaired  Confusion WDL  Danger to Self  Current suicidal ideation? Denies  Danger to Others  Danger to Others None reported or observed

## 2021-04-06 NOTE — Progress Notes (Signed)
[facility] LCSW Progress Note   04/06/2021    9:20 AM  Roger [name]   [id number]   Type of Contact and Topic:  Discharge Coordination   CSW attempted to reach [facility] at [phone], regarding to discharge planning. Roger [name], grand mother, was not reached. Left HIPAA compliant voicemail with contact information and callback request. Situation ongoing, CSW will continue to monitor and update note as more information becomes available.     Signed:  Corky Crafts, MSW, LCSWA, LCAS 04/06/2021 9:20 AM

## 2021-04-06 NOTE — Progress Notes (Signed)
[facility] MD Progress Note  04/06/2021 3:48 PM Roger [name]  MRN:  [id number]  Subjective: Pt seen and chart reviewed.  He continues to take his medication this morning and denies side effects.  His grandmother agrees to have him return to live with her if he takes [place], he agrees.  Pleasant and cooperative on the unit with no issues.  Principal Problem: Bipolar affective disorder, manic, moderate (HCC) Diagnosis: Principal Problem:   Bipolar affective disorder, manic, moderate (HCC) Active Problems:   Bipolar affective disorder, currently manic, moderate (HCC)  Total Time spent with patient: 15 minutes  Past Psychiatric History: hx of bipolar and polysubstance use disorder.  He was admitted at [facility] in [facility] 2021 and was discharged on Invega sustenna 234mg  IM shot.  And temazepam for sleep.    Past Medical History:  Past Medical History:  Diagnosis Date   Depression    Headache    Substance abuse (HCC)     Past Surgical History:  Procedure Laterality Date   FEMUR FRACTURE SURGERY     Family History:  Family History  Problem Relation Age of Onset   Alcohol abuse Father    Family Psychiatric  History: father has hx of alcohol abuse.  Social History:  Social History   Substance and Sexual Activity  Alcohol Use Yes   Alcohol/week: 1.0 standard drink   Types: 1 Shots of liquor per week     Social History   Substance and Sexual Activity  Drug Use Yes   Types: Amphetamines, Cocaine, Marijuana, Benzodiazepines, MDMA (Ecstacy), Hydrocodone    Social History   Socioeconomic History   Marital status: Single    Spouse name: Not on file   Number of children: Not on file   Years of education: Not on file   Highest education level: Not on file  Occupational History   Not on file  Tobacco Use   Smoking status: Former    Packs/day: 0.50    Years: 10.00    Pack years: 5.00    Types: Cigarettes   Smokeless tobacco: Former    Quit date: 11/24/2014  Vaping  Use   Vaping Use: Never used  Substance and Sexual Activity   Alcohol use: Yes    Alcohol/week: 1.0 standard drink    Types: 1 Shots of liquor per week   Drug use: Yes    Types: Amphetamines, Cocaine, Marijuana, Benzodiazepines, MDMA (Ecstacy), Hydrocodone   Sexual activity: Yes    Birth control/protection: None  Other Topics Concern   Not on file  Social History Narrative   Not on file   Social Determinants of Health   Financial Resource Strain: Not on file  Food Insecurity: Not on file  Transportation Needs: Not on file  Physical Activity: Not on file  Stress: Not on file  Social Connections: Not on file   Additional Social History:    Sleep: Fair  Appetite:  Fair  Current Medications: Current Facility-Administered Medications  Medication Dose Route Frequency Provider Last Rate Last Admin   acetaminophen (TYLENOL) tablet 650 mg  650 mg Oral Q6H PRN 01/24/2015, NP       alum & mag hydroxide-simeth (MAALOX/MYLANTA) 200-200-20 MG/5ML suspension 30 mL  30 mL Oral Q4H PRN 10-24-2000, NP       magnesium hydroxide (MILK OF MAGNESIA) suspension 30 mL  30 mL Oral Daily PRN Charm Rings, NP       paliperidone (INVEGA) 24 hr tablet 3 mg  3 mg Oral Daily Charm Rings, NP   3 mg at 04/06/21 9924    Lab Results:  Results for orders placed or performed during the hospital encounter of 04/01/21 (from the past 48 hour(s))  Lipid panel     Status: None   Collection Time: 04/05/21  4:36 PM  Result Value Ref Range   Cholesterol 159 0 - 200 mg/dL   Triglycerides 70 <268 mg/dL   HDL 59 >34 mg/dL   Total CHOL/HDL Ratio 2.7 RATIO   VLDL 14 0 - 40 mg/dL   LDL Cholesterol 86 0 - 99 mg/dL    Comment:        Total Cholesterol/HDL:CHD Risk Coronary Heart Disease Risk Table                     Men   Women  1/2 Average Risk   3.4   3.3  Average Risk       5.0   4.4  2 X Average Risk   9.6   7.1  3 X Average Risk  23.4   11.0        Use the calculated Patient  Ratio above and the CHD Risk Table to determine the patient's CHD Risk.        ATP III CLASSIFICATION (LDL):  <100     mg/dL   Optimal  196-222  mg/dL   Near or Above                    Optimal  130-159  mg/dL   Borderline  979-892  mg/dL   High  >119     mg/dL   Very High Performed at [facility], [address] [place], [place] [address]     Blood Alcohol level:  Lab Results  Component Value Date   [place] <10 03/31/2021   ETH <10 05/23/2020    Metabolic Disorder Labs: Lab Results  Component Value Date   HGBA1C 5.8 (H) 05/12/2020   MPG 119.76 05/12/2020   MPG 116.89 06/01/2017   Lab Results  Component Value Date   PROLACTIN 25.5 (H) 12/25/2015   Lab Results  Component Value Date   CHOL 159 04/05/2021   TRIG 70 04/05/2021   HDL 59 04/05/2021   CHOLHDL 2.7 04/05/2021   VLDL 14 04/05/2021   LDLCALC 86 04/05/2021   LDLCALC 81 05/12/2020    Musculoskeletal: Strength & Muscle Tone: within normal limits Gait & Station: normal Patient leans: N/A  Psychiatric Specialty Exam: Physical Exam Vitals and nursing note reviewed.  Constitutional:      Appearance: Normal appearance.  HENT:     Head: Normocephalic.     Nose: Nose normal.  Pulmonary:     Effort: Pulmonary effort is normal.  Musculoskeletal:        General: Normal range of motion.     Cervical back: Normal range of motion.  Neurological:     General: No focal deficit present.     Mental Status: He is alert and oriented to person, place, and time.  Psychiatric:        Attention and Perception: Attention and perception normal.        Mood and Affect: Mood is anxious. Affect is blunt.        Speech: Speech normal.        Behavior: Behavior normal. Behavior is cooperative.        Thought Content: Thought content is paranoid.  Cognition and Memory: Cognition is impaired.        Judgment: Judgment normal.    Review of Systems  Psychiatric/Behavioral:  The patient is nervous/anxious.    All other systems reviewed and are negative.  Blood pressure 124/84, pulse 95, temperature 98.1 F (36.7 C), temperature source Oral, resp. rate 18, height 5\' 6"  (1.676 m), weight 58.3 kg, SpO2 100 %.Body mass index is 20.74 kg/m.  General Appearance: Casual  Eye Contact:  Good  Speech:  WDL  Volume:  Normal  Mood:  Anxious  Affect:  Blunt  Thought Process:  Coherent and Descriptions of Associations: Intact  Orientation:  Full (Time, Place, and Person)  Thought Content:  Paranoid Ideation  Suicidal Thoughts:  No  Homicidal Thoughts:  No  Memory:  Immediate;   Good Recent;   Fair Remote;   Good  Judgement:  Fair  Insight:  Lacking  Psychomotor Activity:  Normal  Concentration:  Concentration: Good and Attention Span: Good  Recall:  Good  Fund of Knowledge:  Fair  Language:  Good  Akathisia:  No  Handed:  Right  AIMS (if indicated):     Assets:  Leisure Time Physical Health Resilience  ADL's:  Intact  Cognition:  WNL  Sleep:  Number of Hours: 8.25      Physical Exam: Physical Exam Vitals and nursing note reviewed.  Constitutional:      Appearance: Normal appearance.  HENT:     Head: Normocephalic.     Nose: Nose normal.  Pulmonary:     Effort: Pulmonary effort is normal.  Musculoskeletal:        General: Normal range of motion.     Cervical back: Normal range of motion.  Neurological:     General: No focal deficit present.     Mental Status: He is alert and oriented to person, place, and time.  Psychiatric:        Attention and Perception: Attention and perception normal.        Mood and Affect: Mood is anxious. Affect is blunt.        Speech: Speech normal.        Behavior: Behavior normal. Behavior is cooperative.        Thought Content: Thought content is paranoid.        Cognition and Memory: Cognition is impaired.        Judgment: Judgment normal.   Review of Systems  Psychiatric/Behavioral:  The patient is nervous/anxious.   All other systems  reviewed and are negative. Blood pressure 124/84, pulse 95, temperature 98.1 F (36.7 C), temperature source Oral, resp. rate 18, height 5\' 6"  (1.676 m), weight 58.3 kg, SpO2 100 %. Body mass index is 20.74 kg/m.   Treatment Plan Summary: Daily contact with patient to assess and evaluate symptoms and progress in treatment and Medication management  Bipolar affective disorder vs Schizoaffective disorder -- increase Invega 3mg  daily to 6 mg daily    Hx of polysubstance use disorder  -- currently actively using THC.  -- recommend substance abuse counseling.   , NP 04/06/2021, 3:48 PM

## 2021-04-06 NOTE — Group Note (Signed)

## 2021-04-07 LAB — HEMOGLOBIN A1C
Hgb A1c MFr Bld: 5.9 % — ABNORMAL HIGH (ref 4.8–5.6)
Mean Plasma Glucose: 123 mg/dL

## 2021-04-07 MED ORDER — PALIPERIDONE PALMITATE ER 156 MG/ML IM SUSY
156.0000 mg | PREFILLED_SYRINGE | Freq: Once | INTRAMUSCULAR | Status: AC
  Administered 2021-04-07: 12:00:00 156 mg via INTRAMUSCULAR
  Filled 2021-04-07: qty 1

## 2021-04-07 NOTE — Progress Notes (Signed)
[facility] MD Progress Note  04/07/2021 11:24 AM Roger [name]  MRN:  [id number]  Subjective: Pt seen and chart reviewed.  He continues to take his medication this morning and denies side effects.  His grandmother agrees to have him return to live with her if he takes [place], he agrees.  Pleasant and cooperative on the unit with no issues. Pt has not been attending groups.  Principal Problem: Bipolar affective disorder, manic, moderate (HCC) Diagnosis: Principal Problem:   Bipolar affective disorder, manic, moderate (HCC) Active Problems:   Bipolar affective disorder, currently manic, moderate (HCC)  Total Time spent with patient: 30 minutes  Past Psychiatric History: hx of bipolar and polysubstance use disorder.  He was admitted at [facility] in [facility] 2021 and was discharged on Invega sustenna 234mg  IM shot.  And temazepam for sleep.    Past Medical History:  Past Medical History:  Diagnosis Date   Depression    Headache    Substance abuse (HCC)     Past Surgical History:  Procedure Laterality Date   FEMUR FRACTURE SURGERY     Family History:  Family History  Problem Relation Age of Onset   Alcohol abuse Father    Family Psychiatric  History: father has hx of alcohol abuse.  Social History:  Social History   Substance and Sexual Activity  Alcohol Use Yes   Alcohol/week: 1.0 standard drink   Types: 1 Shots of liquor per week     Social History   Substance and Sexual Activity  Drug Use Yes   Types: Amphetamines, Cocaine, Marijuana, Benzodiazepines, MDMA (Ecstacy), Hydrocodone    Social History   Socioeconomic History   Marital status: Single    Spouse name: Not on file   Number of children: Not on file   Years of education: Not on file   Highest education level: Not on file  Occupational History   Not on file  Tobacco Use   Smoking status: Former    Packs/day: 0.50    Years: 10.00    Pack years: 5.00    Types: Cigarettes   Smokeless tobacco: Former     Quit date: 11/24/2014  Vaping Use   Vaping Use: Never used  Substance and Sexual Activity   Alcohol use: Yes    Alcohol/week: 1.0 standard drink    Types: 1 Shots of liquor per week   Drug use: Yes    Types: Amphetamines, Cocaine, Marijuana, Benzodiazepines, MDMA (Ecstacy), Hydrocodone   Sexual activity: Yes    Birth control/protection: None  Other Topics Concern   Not on file  Social History Narrative   Not on file   Social Determinants of Health   Financial Resource Strain: Not on file  Food Insecurity: Not on file  Transportation Needs: Not on file  Physical Activity: Not on file  Stress: Not on file  Social Connections: Not on file   Additional Social History:    Sleep: Fair  Appetite:  Fair  Current Medications: Current Facility-Administered Medications  Medication Dose Route Frequency Provider Last Rate Last Admin   acetaminophen (TYLENOL) tablet 650 mg  650 mg Oral Q6H PRN 01/24/2015, NP       alum & mag hydroxide-simeth (MAALOX/MYLANTA) 200-200-20 MG/5ML suspension 30 mL  30 mL Oral Q4H PRN 10-24-2000, NP       magnesium hydroxide (MILK OF MAGNESIA) suspension 30 mL  30 mL Oral Daily PRN Charm Rings, NP       paliperidone (INVEGA  SUSTENNA) injection 156 mg  156 mg Intramuscular Once Charm Rings, NP       paliperidone (INVEGA) 24 hr tablet 6 mg  6 mg Oral Daily Charm Rings, NP   6 mg at 04/07/21 7939    Lab Results:  Results for orders placed or performed during the hospital encounter of 04/01/21 (from the past 48 hour(s))  Hemoglobin A1c     Status: Abnormal   Collection Time: 04/05/21  4:36 PM  Result Value Ref Range   Hgb A1c MFr Bld 5.9 (H) 4.8 - 5.6 %    Comment: (NOTE)         Prediabetes: 5.7 - 6.4         Diabetes: >6.4         Glycemic control for adults with diabetes: <7.0    Mean Plasma Glucose 123 mg/dL    Comment: (NOTE) Performed At: [facility] [address] [place], [place] [id number] Jolene Schimke MD  QT:[id number]   Lipid panel     Status: None   Collection Time: 04/05/21  4:36 PM  Result Value Ref Range   Cholesterol 159 0 - 200 mg/dL   Triglycerides 70 <625 mg/dL   HDL 59 >63 mg/dL   Total CHOL/HDL Ratio 2.7 RATIO   VLDL 14 0 - 40 mg/dL   LDL Cholesterol 86 0 - 99 mg/dL    Comment:        Total Cholesterol/HDL:CHD Risk Coronary Heart Disease Risk Table                     Men   Women  1/2 Average Risk   3.4   3.3  Average Risk       5.0   4.4  2 X Average Risk   9.6   7.1  3 X Average Risk  23.4   11.0        Use the calculated Patient Ratio above and the CHD Risk Table to determine the patient's CHD Risk.        ATP III CLASSIFICATION (LDL):  <100     mg/dL   Optimal  893-734  mg/dL   Near or Above                    Optimal  130-159  mg/dL   Borderline  287-681  mg/dL   High  >157     mg/dL   Very High Performed at [facility], [address] [place], [place] [address]     Blood Alcohol level:  Lab Results  Component Value Date   [facility] <10 03/31/2021   ETH <10 05/23/2020    Metabolic Disorder Labs: Lab Results  Component Value Date   HGBA1C 5.9 (H) 04/05/2021   MPG 123 04/05/2021   MPG 119.76 05/12/2020   Lab Results  Component Value Date   PROLACTIN 25.5 (H) 12/25/2015   Lab Results  Component Value Date   CHOL 159 04/05/2021   TRIG 70 04/05/2021   HDL 59 04/05/2021   CHOLHDL 2.7 04/05/2021   VLDL 14 04/05/2021   LDLCALC 86 04/05/2021   LDLCALC 81 05/12/2020    Musculoskeletal: Strength & Muscle Tone: within normal limits Gait & Station: normal Patient leans: N/A  Psychiatric Specialty Exam: Physical Exam Vitals and nursing note reviewed.  Constitutional:      Appearance: Normal appearance.  HENT:     Head: Normocephalic.     Nose: Nose normal.  Pulmonary:  Effort: Pulmonary effort is normal.  Musculoskeletal:        General: Normal range of motion.     Cervical back: Normal range of motion.  Neurological:      General: No focal deficit present.     Mental Status: He is alert and oriented to person, place, and time.  Psychiatric:        Attention and Perception: Attention and perception normal.        Mood and Affect: Mood is anxious. Affect is blunt.        Speech: Speech normal.        Behavior: Behavior normal. Behavior is cooperative.        Thought Content: Thought content is paranoid.        Cognition and Memory: Cognition is impaired.        Judgment: Judgment normal.    Review of Systems  Psychiatric/Behavioral:  The patient is nervous/anxious.   All other systems reviewed and are negative.  Blood pressure 97/78, pulse 85, temperature 98.1 F (36.7 C), temperature source Oral, resp. rate 18, height  (1.676 m), weight 58.3 kg, SpO2 100 %.Body mass index is 20.74 kg/m.  General Appearance: Casual  Eye Contact:  Good  Speech:  WDL  Volume:  Normal  Mood:  Anxious  Affect:  Blunt  Thought Process:  Coherent and Descriptions of Associations: Intact  Orientation:  Full (Time, Place, and Person)  Thought Content:  Paranoid Ideation  Suicidal Thoughts:  No  Homicidal Thoughts:  No  Memory:  Immediate;   Good Recent;   Fair Remote;   Good  Judgement:  Fair  Insight:  Lacking  Psychomotor Activity:  Normal  Concentration:  Concentration: Good and Attention Span: Good  Recall:  Good  Fund of Knowledge:  Fair  Language:  Good  Akathisia:  No  Handed:  Right  AIMS (if indicated):     Assets:  Leisure Time Physical Health Resilience  ADL's:  Intact  Cognition:  WNL  Sleep:  Number of Hours: 9      Physical Exam: Physical Exam Vitals and nursing note reviewed.  Constitutional:      Appearance: Normal appearance.  HENT:     Head: Normocephalic.     Nose: Nose normal.  Pulmonary:     Effort: Pulmonary effort is normal.  Musculoskeletal:        General: Normal range of motion.     Cervical back: Normal range of motion.  Neurological:     General: No focal deficit  present.     Mental Status: He is alert and oriented to person, place, and time.  Psychiatric:        Attention and Perception: Attention and perception normal.        Mood and Affect: Mood is anxious. Affect is blunt.        Speech: Speech normal.        Behavior: Behavior normal. Behavior is cooperative.        Thought Content: Thought content is paranoid.        Cognition and Memory: Cognition is impaired.        Judgment: Judgment normal.   Review of Systems  Psychiatric/Behavioral:  The patient is nervous/anxious.   All other systems reviewed and are negative. Blood pressure 97/78, pulse 85, temperature 98.1 F (36.7 C), temperature source Oral, resp. rate 18, height  (1.676 m), weight 58.3 kg, SpO2 100 %. Body mass index is 20.74 kg/m.  Treatment Plan Summary: Daily contact with patient to assess and evaluate symptoms and progress in treatment and Medication management  Bipolar affective disorder vs Schizoaffective disorder --continue Invega 6 mg daily. --administer Invega Sustenna 156 mg IM today, so can observe for any side effects prior to discharge  --encouraged to attend groups today   Hx of polysubstance use disorder  -- currently actively using THC.  -- recommend substance abuse counseling.   Ancil Linsey, MD 04/07/2021, 11:24 AM

## 2021-04-07 NOTE — Progress Notes (Signed)
Pt quiet, but approachable. Minimal interaction with staff/peers, frequently in room. Pt tolerated his IM invega injection well and medication teaching done. He continues to deny SI/HI and AVH. No complaints of anxiety.

## 2021-04-07 NOTE — Progress Notes (Signed)
[facility] LCSW Progress Note   04/07/2021    1:44 PM  Roger [name]   [id number]   Type of Contact and Topic:  Discharge Planning   CSW contacted patient grandmother to coordinate return to place of residence. Grandmother is agreeable to the patient returning now that LAI has been given. Informed her of aftercare plans with [facility] for continuation of LAI. No further action.   CSW scheduled hospital discharge appointment with [facility] at this time.      Signed:  Corky Crafts, MSW, LCSWA, LCAS 04/07/2021 1:44 PM

## 2021-04-07 NOTE — Group Note (Signed)
[facility] LCSW Group Therapy Note   Group Date: 04/07/2021 Start Time: 1300 End Time: 1400   Type of Therapy/Topic:  Group Therapy:  Balance in Life  Participation Level:  Did Not Attend   Description of Group:    This group will address the concept of balance and how it feels and looks when one is unbalanced. Patients will be encouraged to process areas in their lives that are out of balance, and identify reasons for remaining unbalanced. Facilitators will guide patients utilizing problem- solving interventions to address and correct the stressor making their life unbalanced. Understanding and applying boundaries will be explored and addressed for obtaining  and maintaining a balanced life. Patients will be encouraged to explore ways to assertively make their unbalanced needs known to significant others in their lives, using other group members and facilitator for support and feedback.  Therapeutic Goals: Patient will identify two or more emotions or situations they have that consume much of in their lives. Patient will identify signs/triggers that life has become out of balance:  Patient will identify two ways to set boundaries in order to achieve balance in their lives:  Patient will demonstrate ability to communicate their needs through discussion and/or role plays  Summary of Patient Progress:    X    Therapeutic Modalities:   Cognitive Behavioral Therapy Solution-Focused Therapy Assertiveness Training   Harden Mo, LCSW

## 2021-04-07 NOTE — Progress Notes (Signed)
Patient is quiet and reserved. He continues to be isolative to his room but did come out for snack this evening.  He has no QHS meds and reports sleeping well and not needing anything to help aid in sleep. Encouraged him to seek out staff with any concerns. Will continue to monitor with q15 minute safety rounds.    C Butler-Nicholson, LPN

## 2021-04-07 NOTE — Plan of Care (Signed)
See progress note Problem: Education: Goal: Knowledge of [facility] Education information/materials will improve Outcome: Progressing Goal: Emotional status will improve Outcome: Progressing Goal: Mental status will improve Outcome: Progressing Goal: Verbalization of understanding the information provided will improve Outcome: Progressing   Problem: Activity: Goal: Interest or engagement in activities will improve Outcome: Progressing Goal: Sleeping patterns will improve Outcome: Progressing   Problem: Coping: Goal: Ability to verbalize frustrations and anger appropriately will improve Outcome: Progressing Goal: Ability to demonstrate self-control will improve Outcome: Progressing   Problem: Health Behavior/Discharge Planning: Goal: Identification of resources available to assist in meeting health care needs will improve Outcome: Progressing Goal: Compliance with treatment plan for underlying cause of condition will improve Outcome: Progressing   Problem: Physical Regulation: Goal: Ability to maintain clinical measurements within normal limits will improve Outcome: Progressing   Problem: Safety: Goal: Periods of time without injury will increase Outcome: Progressing   Problem: Education: Goal: Utilization of techniques to improve thought processes will improve Outcome: Progressing Goal: Knowledge of the prescribed therapeutic regimen will improve Outcome: Progressing   Problem: Activity: Goal: Interest or engagement in leisure activities will improve Outcome: Progressing Goal: Imbalance in normal sleep/wake cycle will improve Outcome: Progressing   Problem: Coping: Goal: Coping ability will improve Outcome: Progressing Goal: Will verbalize feelings Outcome: Progressing   Problem: Health Behavior/Discharge Planning: Goal: Ability to make decisions will improve Outcome: Progressing Goal: Compliance with therapeutic regimen will improve Outcome:  Progressing   Problem: Role Relationship: Goal: Will demonstrate positive changes in social behaviors and relationships Outcome: Progressing   Problem: Safety: Goal: Ability to disclose and discuss suicidal ideas will improve Outcome: Progressing Goal: Ability to identify and utilize support systems that promote safety will improve Outcome: Progressing   Problem: Self-Concept: Goal: Will verbalize positive feelings about self Outcome: Progressing Goal: Level of anxiety will decrease Outcome: Progressing

## 2021-04-07 NOTE — Progress Notes (Signed)
Recreation Therapy Notes  Date: 04/07/2021  Time: 10:30 am     Location:  Craft room    Behavioral response: N/A   Intervention Topic: Time Management   Discussion/Intervention: Patient did not attend group.  Clinical Observations/Feedback:  Patient did not attend group.   Maycol Hoying LRT/CTRS        Zendayah Hardgrave 04/07/2021 11:38 AM

## 2021-04-07 NOTE — Progress Notes (Signed)
Pt in bed at the being of shift, prompted get up for breakfast and medications. Pt denies SI/HI and AVH. Pt stated he here, because " I was rearranging the furniture/TV at my grandmother's misunderstood. The next things I know the police was at the house". I discussed the patient's diagnosis and ways to control his emotions/mood swings. He denied anxiety issues. His goals is to get better and to learn as much as possible while in the hospital.

## 2021-04-07 NOTE — Plan of Care (Signed)
  Problem: Education: Goal: Knowledge of [facility] Education information/materials will improve Outcome: Progressing Goal: Emotional status will improve Outcome: Progressing Goal: Mental status will improve Outcome: Progressing Goal: Verbalization of understanding the information provided will improve Outcome: Progressing   Problem: Activity: Goal: Interest or engagement in activities will improve Outcome: Progressing Goal: Sleeping patterns will improve Outcome: Progressing   Problem: Coping: Goal: Ability to verbalize frustrations and anger appropriately will improve Outcome: Progressing Goal: Ability to demonstrate self-control will improve Outcome: Progressing   

## 2021-04-08 ENCOUNTER — Other Ambulatory Visit: Payer: Self-pay

## 2021-04-08 MED ORDER — INVEGA SUSTENNA 156 MG/ML IM SUSY
156.0000 mg | PREFILLED_SYRINGE | INTRAMUSCULAR | 2 refills | Status: DC
  Filled 2021-04-08: qty 1, 28d supply, fill #0

## 2021-04-08 MED ORDER — INVEGA SUSTENNA 156 MG/ML IM SUSY
156.0000 mg | PREFILLED_SYRINGE | Freq: Once | INTRAMUSCULAR | 0 refills | Status: DC
  Filled 2021-04-08: qty 1, 1d supply, fill #0

## 2021-04-08 MED ORDER — PALIPERIDONE PALMITATE ER 234 MG/1.5ML IM SUSY: 156.0000 mg | PREFILLED_SYRINGE | INTRAMUSCULAR | 1 refills | Status: DC

## 2021-04-08 NOTE — [facility] Suicide Risk Assessment (Signed)
[facility] Discharge Suicide Risk Assessment   Principal Problem: Bipolar affective disorder, manic, moderate (HCC) Discharge Diagnoses: Principal Problem:   Bipolar affective disorder, manic, moderate (HCC) Active Problems:   Bipolar affective disorder, currently manic, moderate (HCC)   Total Time spent with patient: 30 minutes  Suicide Risk:  Minimal: No identifiable suicidal ideation.  Patients presenting with no risk factors but with morbid ruminations; may be classified as minimal risk based on the severity of the depressive symptoms   PSYCH RISK ASSESSMENT: Suicide Risk Assessment: age, male gender - represent non-modifiable/baseline risk factors. Presence of mental disorder, alcohol/substance use are dynamic risk factors. The patient denies suicidal thoughts, denies a history of suicidal attempts, denies access to firearm. Patient is future-oriented, has responsibilities, help-seeking, has access to mental health care, has family and community support, has cultural/religious beliefs that discourage suicide - all protective factors. Therefore, represents a low risk for harming self acutely and elevated chronic risk due to non-modifiable risk factors.   Violence Risk Assessment: male gender, younger age, substance use, major mental illness - are static risk factors predisposing to violence - place him at low to moderate chronic risk of harming others; though he is an acute low risk to others as he is not having any thoughts of wanting to hurt others and recognizes the consequences if he be physically violent.    [place] Suicide Severity Rating Scale  Wish to be dead: No  Suicidal thoughts: No                  Suicidal thoughts with method: No                  Suicidal intent: No                  Suicide intent with specific plan: No                  Suicide behavior: No     Follow-up Information     [facility] Follow up.   Why: Please present for scheduled  inperson appointment on December 28, 202 at 0930 AM.   Please bring all medications and discharge paperwork to appointment Contact information: [address] [place] [address] [phone]                 Plan Of Care/Follow-up recommendations:  Other:  f/u with outpatient MH clinic  Emergency Contact Plan: Patient understands to call Suicide Hotline at 50, or Mobile Crisis at [phone], call 911, or go to the nearest ER as appropriate in case of thoughts of harm to self or others, or acute onset of intolerable side effects.   Thalia Party, MD 04/08/2021, 11:39 AM

## 2021-04-08 NOTE — Progress Notes (Signed)
Isolative to his room he is pleasant and cooperative. He denies si  hi  avh depression and anxiety at this encounter. He has no qhs meds and reports eating and sleeping well. Ready for discharge.  Encouraged him to seek staff with any concerns.     Cleo Butler-Nicholson, LPN

## 2021-04-08 NOTE — Plan of Care (Signed)
  Problem: Education: Goal: Knowledge of [facility] Education information/materials will improve Outcome: Progressing Goal: Emotional status will improve Outcome: Progressing Goal: Mental status will improve Outcome: Progressing Goal: Verbalization of understanding the information provided will improve Outcome: Progressing   Problem: Activity: Goal: Interest or engagement in activities will improve Outcome: Progressing Goal: Sleeping patterns will improve Outcome: Progressing   Problem: Coping: Goal: Ability to verbalize frustrations and anger appropriately will improve Outcome: Progressing Goal: Ability to demonstrate self-control will improve Outcome: Progressing   

## 2021-04-08 NOTE — Progress Notes (Signed)
°  [facility] Adult Case Management Discharge Plan :  Will you be returning to the same living situation after discharge:  Yes,  Patient to return to grandmother's residence.  At discharge, do you have transportation home?: Yes,  CSW to assist with transportation. Patient has signed the [facility] transportation waiver which was emailed to transportation office.   Do you have the ability to pay for your medications: No. No ins noted.   Release of information consent forms completed and in the chart;  Patient's signature needed at discharge.  Patient to Follow up at:  Follow-up Information     [facility] Follow up.   Why: Please present for scheduled inperson appointment on December 28, 202 at 0930 AM.   Please bring all medications and discharge paperwork to appointment Contact information: [address] [facility] [address] [phone]                 Next level of care provider has access to [facility] Link:no  Safety Planning and Suicide Prevention discussed: Yes,  SPE completed with patient and Ronna Polio, grandmother.    Has patient been referred to the Quitline?: Patient refused referral  Patient has been referred for addiction treatment: Yes  Corky Crafts, LCSWA 04/08/2021, 12:02 PM

## 2021-04-08 NOTE — Progress Notes (Signed)
Recreation Therapy Notes    Date: 04/08/2021  Time: 10:00am    Location:  Craft room    Behavioral response: N/A   Intervention Topic: Self-care   Discussion/Intervention: Patient did not attend group.  Clinical Observations/Feedback:  Patient did not attend group.   Julian Askin LRT/CTRS        Ikesha Siller 04/08/2021 12:36 PM

## 2021-04-08 NOTE — Discharge Summary (Signed)
Physician Discharge Summary Note  Patient:  Roger [name] is an 30 y.o., male MRN:  [id number] DOB:  Mar 20, 1991 Patient phone:  [phone] (home)  Patient address:   [address] [place] [address],  Total Time spent with patient: 45 minutes  Date of Admission:  04/01/2021 Date of Discharge: 04/08/2021  Reason for Admission:  30yo AAM with hx of bipolar disorder, was IVCed by his grandmom for aggressive behaviors and anger outburst.  Reportedly that he has stopped taking his medicines for at least 2 months.   Principal Problem: Bipolar affective disorder, manic, moderate (HCC) Discharge Diagnoses: Principal Problem:   Bipolar affective disorder, manic, moderate (HCC) Active Problems:   Bipolar affective disorder, currently manic, moderate (HCC)   Past Psychiatric History: bipolar disorder. He was admitted at [facility] in [facility] 2021 and was discharged on Invega sustenna 234mg  IM shot.  And temazepam for sleep.       Past Medical History:  Past Medical History:  Diagnosis Date   Depression    Headache    Substance abuse (HCC)     Past Surgical History:  Procedure Laterality Date   FEMUR FRACTURE SURGERY     Family History:  Family History  Problem Relation Age of Onset   Alcohol abuse Father    Family Psychiatric  History: n/a Social History:  Social History   Substance and Sexual Activity  Alcohol Use Yes   Alcohol/week: 1.0 standard drink   Types: 1 Shots of liquor per week     Social History   Substance and Sexual Activity  Drug Use Yes   Types: Amphetamines, Cocaine, Marijuana, Benzodiazepines, MDMA (Ecstacy), Hydrocodone    Social History   Socioeconomic History   Marital status: Single    Spouse name: Not on file   Number of children: Not on file   Years of education: Not on file   Highest education level: Not on file  Occupational History   Not on file  Tobacco Use   Smoking status: Former    Packs/day: 0.50    Years: 10.00     Pack years: 5.00    Types: Cigarettes   Smokeless tobacco: Former    Quit date: 11/24/2014  Vaping Use   Vaping Use: Never used  Substance and Sexual Activity   Alcohol use: Yes    Alcohol/week: 1.0 standard drink    Types: 1 Shots of liquor per week   Drug use: Yes    Types: Amphetamines, Cocaine, Marijuana, Benzodiazepines, MDMA (Ecstacy), Hydrocodone   Sexual activity: Yes    Birth control/protection: None  Other Topics Concern   Not on file  Social History Narrative   Not on file   Social Determinants of Health   Financial Resource Strain: Not on file  Food Insecurity: Not on file  Transportation Needs: Not on file  Physical Activity: Not on file  Stress: Not on file  Social Connections: Not on file    Hospital Course:   The patient was admitted to Adult Psychiatry on involuntary basis. He was restricted to ward and placed on suicide precautions. Patient was introduced to milieu activities and encouraged to participate in psycho-social groups. The plan at the time of admission was safety, stabilization and treatment.  For the management of mood disorder, patient was restarted on Paliperidone oral 6mg  daily, then on 04/07/21 transitioned to LAI 156mg  IM Q28 days. Patient never required as needed medications for agitation or psychosis. He participated in groups and socialized with few  peers. He never required seclusion or restraints. His initial symptoms of agitation and aggression had improved during the course of hospitalization.  He reports feeling much better, reports being in good mood, denies any thoughts of harming self, denies thoughts of harming others. Denies any physical complaints. He reports he can contract for safety if discharged home.   On the day of discharge 04/08/21, the patient was considered an acute LOW risk of harm to self or others.  Physical Findings: AIMS: Facial and Oral Movements Muscles of Facial Expression: None, normal Lips and  Perioral Area: None, normal Jaw: None, normal Tongue: None, normal,Extremity Movements Upper (arms, wrists, hands, fingers): None, normal Lower (legs, knees, ankles, toes): None, normal, Trunk Movements Neck, shoulders, hips: None, normal, Overall Severity Severity of abnormal movements (highest score from questions above): None, normal Incapacitation due to abnormal movements: None, normal Patient's awareness of abnormal movements (rate only patient's report): No Awareness, Dental Status Current problems with teeth and/or dentures?: No Does patient usually wear dentures?: No  CIWA:    COWS:     Psychiatric Specialty Exam:  Appearance:  AAM, appearing stated age, appears well-nourished;  wearing appropriate clothes, with fair grooming and hygiene. Normal level of alertness and appropriate facial expression.  Attitude/Behavior: calm, cooperative, engaging with appropriate eye contact.  Motor: WNL; dyskinesias not evident. Gait appears in full range.  Speech: spontaneous, clear, coherent, normal comprehension.  Mood: euthymic, " good ".  Affect: appropriately-reactive.  Thought process: patient appears coherent, organized, logical, goal-directed.  Thought content: patient denies suicidal thoughts, denies homicidal thoughts; did not express any delusions.  Thought perception: patient denies auditory and visual hallucinations, no illusions, no depersonalizations. Did not appear internally stimulated.  Cognition: patient is alert and oriented in self, place, date; with intact abstract, fund of knowledge, attention and concentration.  Insight: fair, in regards of understanding of presence, nature, cause, and significance of mental or emotional problem.  Judgement: fair, in regards of ability to make good decisions concerning the appropriate thing to do in various situations, including ability to form opinions regarding their mental health condition.   Physical Exam: Physical  Exam ROS Blood pressure 109/68, pulse 94, temperature 97.6 F (36.4 C), temperature source Oral, resp. rate 17, height 5\' 6"  (1.676 m), weight 58.3 kg, SpO2 99 %. Body mass index is 20.74 kg/m.   Social History   Tobacco Use  Smoking Status Former   Packs/day: 0.50   Years: 10.00   Pack years: 5.00   Types: Cigarettes  Smokeless Tobacco Former   Quit date: 11/24/2014   Tobacco Cessation:  A prescription for an FDA-approved tobacco cessation medication was offered at discharge and the patient refused   Blood Alcohol level:  Lab Results  Component Value Date   ETH <10 03/31/2021   ETH <10 0000000    Metabolic Disorder Labs:  Lab Results  Component Value Date   HGBA1C 5.9 (H) 04/05/2021   MPG 123 04/05/2021   MPG 119.76 05/12/2020   Lab Results  Component Value Date   PROLACTIN 25.5 (H) 12/25/2015   Lab Results  Component Value Date   CHOL 159 04/05/2021   TRIG 70 04/05/2021   HDL 59 04/05/2021   CHOLHDL 2.7 04/05/2021   VLDL 14 04/05/2021   LDLCALC 86 04/05/2021   LDLCALC 81 05/12/2020    See Psychiatric Specialty Exam and Suicide Risk Assessment completed by Attending Physician prior to discharge.  Discharge destination:  Home  Is patient on multiple antipsychotic therapies at discharge:  No   Has Patient had three or more failed trials of antipsychotic monotherapy by history:  No  Recommended Plan for Multiple Antipsychotic Therapies: NA   Allergies as of 04/08/2021       Reactions   Haldol [haloperidol Lactate]    EPS sxs        Medication List     STOP taking these medications    amoxicillin 500 MG tablet Commonly known as: AMOXIL   cyclobenzaprine 10 MG tablet Commonly known as: FLEXERIL   ibuprofen 600 MG tablet Commonly known as: ADVIL   paliperidone 6 MG 24 hr tablet Commonly known as: INVEGA   risperiDONE 1 MG tablet Commonly known as: RISPERDAL   sertraline 100 MG tablet Commonly known as: ZOLOFT   temazepam 15 MG  capsule Commonly known as: RESTORIL       TAKE these medications      Indication  paliperidone 234 MG/1.5ML Susy injection Commonly known as: INVEGA SUSTENNA Inject 156 mg into the muscle every 28 (twenty-eight) days. What changed: how much to take  Indication: Schizoaffective Disorder        Follow-up [facility] Follow up.   Why: Please present for scheduled inperson appointment on December 28, 202 at 0930 AM.   Please bring all medications and discharge paperwork to appointment Contact information: [place] [phone] [phone]                 Follow-up recommendations:  Other:  follow up wit outpatient psychiatrist  Comments:   Emergency Contact Plan: Patient understands to call Suicide Hotline at 16, or Mobile Crisis at [phone], call 911, or go to the nearest ER as appropriate in case of thoughts of harm to self or others, or acute onset of intolerable side effects.   Signed: Larita Fife, MD 04/08/2021, 11:13 AM

## 2021-04-08 NOTE — Progress Notes (Signed)
Pt denies depression, anxiety, SI, HI and AVH. Pt was educated on care plan and verbalizes understanding. Yaniyah Koors RN 

## 2021-04-08 NOTE — Progress Notes (Signed)
Recreation Therapy Notes  INPATIENT RECREATION TR PLAN  Patient Details Name: Ranferi Clingan MRN: [id number] DOB: 1991/01/11 Today's Date: 04/08/2021  Rec Therapy Plan Is patient appropriate for Therapeutic Recreation?: Yes Treatment times per week: at least 3 Estimated Length of Stay: 5-7 days TR Treatment/Interventions: Group participation (Comment)  Discharge Criteria Pt will be discharged from therapy if:: Discharged Treatment plan/goals/alternatives discussed and agreed upon by:: Patient/family  Discharge Summary Short term goals set: Patient will engage in groups without prompting or encouragement from LRT x3 group sessions within 5 recreation therapy group sessions Short term goals met: Not met Reason goals not met: Patient did not attend any groups Therapeutic equipment acquired: N/A Reason patient discharged from therapy: Discharge from hospital Pt/family agrees with progress & goals achieved: Yes Date patient discharged from therapy: 04/08/21   Mikaelah Trostle 04/08/2021, 3:12 PM

## 2022-04-24 ENCOUNTER — Emergency Department
Admission: EM | Admit: 2022-04-24 | Discharge: 2022-04-25 | Disposition: A | Discharge status: Other Acute Inpatient Hospital | Payer: No Typology Code available for payment source | Attending: Emergency Medicine | Admitting: Emergency Medicine

## 2022-04-24 DIAGNOSIS — Z87891 Personal history of nicotine dependence: Secondary | ICD-10-CM | POA: Diagnosis not present

## 2022-04-24 DIAGNOSIS — Z1152 Encounter for screening for COVID-19: Secondary | ICD-10-CM | POA: Diagnosis not present

## 2022-04-24 DIAGNOSIS — R45851 Suicidal ideations: Secondary | ICD-10-CM | POA: Diagnosis not present

## 2022-04-24 DIAGNOSIS — F3164 Bipolar disorder, current episode mixed, severe, with psychotic features: Secondary | ICD-10-CM | POA: Insufficient documentation

## 2022-04-24 DIAGNOSIS — F32A Depression, unspecified: Secondary | ICD-10-CM | POA: Diagnosis present

## 2022-04-24 LAB — URINE DRUG SCREEN, QUALITATIVE (ARMC ONLY)
Amphetamines, Ur Screen: NOT DETECTED
Barbiturates, Ur Screen: NOT DETECTED
Benzodiazepine, Ur Scrn: NOT DETECTED
Cannabinoid 50 Ng, Ur ~~LOC~~: POSITIVE — AB
Cocaine Metabolite,Ur ~~LOC~~: NOT DETECTED
MDMA (Ecstasy)Ur Screen: NOT DETECTED
Methadone Scn, Ur: NOT DETECTED
Opiate, Ur Screen: NOT DETECTED
Phencyclidine (PCP) Ur S: NOT DETECTED
Tricyclic, Ur Screen: NOT DETECTED

## 2022-04-24 LAB — RESP PANEL BY RT-PCR (RSV, FLU A&B, COVID)  RVPGX2
Influenza A by PCR: NEGATIVE
Influenza B by PCR: NEGATIVE
Resp Syncytial Virus by PCR: NEGATIVE
SARS Coronavirus 2 by RT PCR: NEGATIVE

## 2022-04-24 LAB — COMPREHENSIVE METABOLIC PANEL
ALT: 11 U/L (ref 0–44)
AST: 20 U/L (ref 15–41)
Albumin: 4.5 g/dL (ref 3.5–5.0)
Alkaline Phosphatase: 62 U/L (ref 38–126)
Anion gap: 9 (ref 5–15)
BUN: 9 mg/dL (ref 6–20)
CO2: 25 mmol/L (ref 22–32)
Calcium: 9 mg/dL (ref 8.9–10.3)
Chloride: 106 mmol/L (ref 98–111)
Creatinine, Ser: 0.93 mg/dL (ref 0.61–1.24)
GFR, Estimated: >60 mL/min (ref >60)
Glucose, Bld: 107 mg/dL — ABNORMAL HIGH (ref 70–99)
Potassium: 3.2 mmol/L — ABNORMAL LOW (ref 3.5–5.1)
Sodium: 140 mmol/L (ref 135–145)
Total Bilirubin: 1.2 mg/dL (ref 0.3–1.2)
Total Protein: 7.5 g/dL (ref 6.5–8.1)

## 2022-04-24 LAB — CBC
HCT: 45.4 % (ref 39.0–52.0)
Hemoglobin: 15.2 g/dL (ref 13.0–17.0)
MCH: 29.7 pg (ref 26.0–34.0)
MCHC: 33.5 g/dL (ref 30.0–36.0)
MCV: 88.7 fL (ref 80.0–100.0)
Platelets: 295 10*3/uL (ref 150–400)
RBC: 5.12 MIL/uL (ref 4.22–5.81)
RDW: 13.1 % (ref 11.5–15.5)
WBC: 10.9 10*3/uL — ABNORMAL HIGH (ref 4.0–10.5)
nRBC: 0 % (ref 0.0–0.2)

## 2022-04-24 LAB — ETHANOL: Alcohol, Ethyl (B): <10 mg/dL (ref <10)

## 2022-04-24 LAB — SALICYLATE LEVEL: Salicylate Lvl: <7 mg/dL — ABNORMAL LOW (ref 7.0–30.0)

## 2022-04-24 LAB — ACETAMINOPHEN LEVEL: Acetaminophen (Tylenol), Serum: <10 ug/mL — ABNORMAL LOW (ref 10–30)

## 2022-04-24 MED ORDER — NICOTINE 21 MG/24HR TD PT24
21.0000 mg | MEDICATED_PATCH | Freq: Once | TRANSDERMAL | Status: DC
  Administered 2022-04-24: 21 mg via TRANSDERMAL
  Filled 2022-04-24: qty 1

## 2022-04-24 MED ORDER — POTASSIUM CHLORIDE CRYS ER 20 MEQ PO TBCR
40.0000 meq | EXTENDED_RELEASE_TABLET | Freq: Once | ORAL | Status: AC
  Administered 2022-04-24: 40 meq via ORAL
  Filled 2022-04-24: qty 2

## 2022-04-24 NOTE — ED Notes (Signed)
IVC pending consult   

## 2022-04-24 NOTE — ED Notes (Signed)
Hospital dinner tray provided with a fresh cup of coke and ice in a foam cup. Pt accepted tray and stated "Thank you." Pt informed to notify nurse or tech in case pt needs anything else. Pt expressed understanding by stating "okay"

## 2022-04-24 NOTE — BH Assessment (Addendum)
Patient has been accepted to [facility].  Patient assigned to [facility]. Accepting physician is Dr. Hermina Barters.  Call report to [phone].  Representative was [employer].   ER Staff is aware of it:  Vaughan Basta, ER Secretary  Dr. Starleen Blue, ER MD  Maudie Mercury, Patient's Nurse     Patient can arrive anytime after 8 AM 04/25/22.

## 2022-04-24 NOTE — Consult Note (Signed)
[facility] Face-to-Face Psychiatry Consult   Reason for Consult: SI and psychosis Referring Physician:  Sidney Ace Patient Identification: Roger [name] MRN:  [id number] Principal Diagnosis: Bipolar affective disorder, mixed, severe, with psychotic behavior (HCC) Diagnosis:  Principal Problem:   Bipolar affective disorder, mixed, severe, with psychotic behavior (HCC)   Total Time spent with patient: 30 minutes  Subjective:   Roger [name] is a 32 y.o. male patient admitted with SI. Patient states, "it would be best if I disappear. My life has no purpose".   HPI: 32 year old male with a known psychiatric history of bipolar affective disorder currently not on medication presents to the emergency department with severe depressive symptoms. The patient expresses feelings of hopelessness and worthlessness and a specific delusion involving a fear of falling into a lava pit.The patient denies current suicidal ideation but acknowledges potential for self-harm if his condition worsens. Grandmother called 911 due to concern for his mental state.  Past Psychiatric History: Patient has a history of bipolar affective disorder with recurrent inpatient psychiatric hospitalizations, the last one being December 2022.   Risk to Self:   Risk to Others:   Prior Inpatient Therapy:   Prior Outpatient Therapy:    Past Medical History:  Past Medical History:  Diagnosis Date   Depression    Headache    Substance abuse (HCC)     Past Surgical History:  Procedure Laterality Date   FEMUR FRACTURE SURGERY     Family History:  Family History  Problem Relation Age of Onset   Alcohol abuse Father    Family Psychiatric  History: Unknown Social History:  Social History   Substance and Sexual Activity  Alcohol Use Yes   Alcohol/week: 1.0 standard drink of alcohol   Types: 1 Shots of liquor per week     Social History   Substance and Sexual Activity  Drug Use Yes   Types: Amphetamines,  Cocaine, Marijuana, Benzodiazepines, MDMA (Ecstacy), Hydrocodone    Social History   Socioeconomic History   Marital status: Single    Spouse name: Not on file   Number of children: Not on file   Years of education: Not on file   Highest education level: Not on file  Occupational History   Not on file  Tobacco Use   Smoking status: Former    Packs/day: 0.50    Years: 10.00    Total pack years: 5.00    Types: Cigarettes   Smokeless tobacco: Former    Quit date: 11/24/2014  Vaping Use   Vaping Use: Never used  Substance and Sexual Activity   Alcohol use: Yes    Alcohol/week: 1.0 standard drink of alcohol    Types: 1 Shots of liquor per week   Drug use: Yes    Types: Amphetamines, Cocaine, Marijuana, Benzodiazepines, MDMA (Ecstacy), Hydrocodone   Sexual activity: Yes    Birth control/protection: None  Other Topics Concern   Not on file  Social History Narrative   Not on file   Social Determinants of Health   Financial Resource Strain: Not on file  Food Insecurity: Not on file  Transportation Needs: Not on file  Physical Activity: Not on file  Stress: Not on file  Social Connections: Not on file   Additional Social History:    Allergies:   Allergies  Allergen Reactions   Haldol [Haloperidol Lactate]     EPS sxs    Labs:  Results for orders placed or performed during the hospital encounter of 04/24/22 (from  the past 48 hour(s))  Comprehensive metabolic panel     Status: Abnormal   Collection Time: 04/24/22 10:19 AM  Result Value Ref Range   Sodium 140 135 - 145 mmol/L   Potassium 3.2 (L) 3.5 - 5.1 mmol/L   Chloride 106 98 - 111 mmol/L   CO2 25 22 - 32 mmol/L   Glucose, Bld 107 (H) 70 - 99 mg/dL    Comment: Glucose reference range applies only to samples taken after fasting for at least 8 hours.   BUN 9 6 - 20 mg/dL   Creatinine, Ser 8.09 0.61 - 1.24 mg/dL   Calcium 9.0 8.9 - 98.3 mg/dL   Total Protein 7.5 6.5 - 8.1 g/dL   Albumin 4.5 3.5 - 5.0 g/dL   AST  20 15 - 41 U/L   ALT 11 0 - 44 U/L   Alkaline Phosphatase 62 38 - 126 U/L   Total Bilirubin 1.2 0.3 - 1.2 mg/dL   GFR, Estimated >38 >25 mL/min    Comment: (NOTE) Calculated using the CKD-EPI Creatinine Equation (2021)    Anion gap 9 5 - 15    Comment: Performed at [facility], [address] [place], [place] [address]  Ethanol     Status: None   Collection Time: 04/24/22 10:19 AM  Result Value Ref Range   Alcohol, Ethyl (B) <10 <10 mg/dL    Comment: (NOTE) Lowest detectable limit for serum alcohol is 10 mg/dL.  For medical purposes only. Performed at [facility], [address] [place], [place] [address]   Salicylate level     Status: Abnormal   Collection Time: 04/24/22 10:19 AM  Result Value Ref Range   Salicylate Lvl <7.0 (L) 7.0 - 30.0 mg/dL    Comment: Performed at [facility], [address] [place], [place] [address]  Acetaminophen level     Status: Abnormal   Collection Time: 04/24/22 10:19 AM  Result Value Ref Range   Acetaminophen (Tylenol), Serum <10 (L) 10 - 30 ug/mL    Comment: (NOTE) Therapeutic concentrations vary significantly. A range of 10-30 ug/mL  may be an effective concentration for many patients. However, some  are best treated at concentrations outside of this range. Acetaminophen concentrations >150 ug/mL at 4 hours after ingestion  and >50 ug/mL at 12 hours after ingestion are often associated with  toxic reactions.  Performed at [facility], [address] [place], [place] [address]   cbc     Status: Abnormal   Collection Time: 04/24/22 10:19 AM  Result Value Ref Range   WBC 10.9 (H) 4.0 - 10.5 K/uL   RBC 5.12 4.22 - 5.81 MIL/uL   Hemoglobin 15.2 13.0 - 17.0 g/dL   HCT 35.3 29.9 - 24.2 %   MCV 88.7 80.0 - 100.0 fL   MCH 29.7 26.0 - 34.0 pg   MCHC 33.5 30.0 - 36.0 g/dL   RDW 68.3 41.9 - 62.2 %   Platelets 295 150 - 400 K/uL   nRBC 0.0 0.0 - 0.2 %    Comment: Performed at [facility], [address] [place], [place] [address]  Urine Drug Screen, Qualitative     Status: Abnormal   Collection Time: 04/24/22 10:22 AM  Result Value Ref Range   Tricyclic, Ur Screen NONE DETECTED NONE DETECTED   Amphetamines, Ur Screen NONE DETECTED NONE DETECTED   MDMA (Ecstasy)Ur Screen NONE DETECTED NONE DETECTED   Cocaine Metabolite,Ur [place] NONE DETECTED NONE DETECTED  Opiate, Ur Screen NONE DETECTED NONE DETECTED   Phencyclidine (PCP) Ur S NONE DETECTED NONE DETECTED   Cannabinoid 50 Ng, Ur Tok POSITIVE (A) NONE DETECTED   Barbiturates, Ur Screen NONE DETECTED NONE DETECTED   Benzodiazepine, Ur Scrn NONE DETECTED NONE DETECTED   Methadone Scn, Ur NONE DETECTED NONE DETECTED    Comment: (NOTE) Tricyclics + metabolites, urine    Cutoff 1000 ng/mL Amphetamines + metabolites, urine  Cutoff 1000 ng/mL MDMA (Ecstasy), urine              Cutoff 500 ng/mL Cocaine Metabolite, urine          Cutoff 300 ng/mL Opiate + metabolites, urine        Cutoff 300 ng/mL Phencyclidine (PCP), urine         Cutoff 25 ng/mL Cannabinoid, urine                 Cutoff 50 ng/mL Barbiturates + metabolites, urine  Cutoff 200 ng/mL Benzodiazepine, urine              Cutoff 200 ng/mL Methadone, urine                   Cutoff 300 ng/mL  The urine drug screen provides only a preliminary, unconfirmed analytical test result and should not be used for non-medical purposes. Clinical consideration and professional judgment should be applied to any positive drug screen result due to possible interfering substances. A more specific alternate chemical method must be used in order to obtain a confirmed analytical result. Gas chromatography / mass spectrometry (GC/MS) is the preferred confirm atory method. Performed at [facility], [address] [place], [place] [address]     No current facility-administered medications for this encounter.   Current Outpatient Medications  Medication Sig Dispense  Refill   paliperidone (INVEGA SUSTENNA) 156 MG/ML SUSY injection Inject 1 mL (156 mg total) into the muscle once for 1 dose. 1 mL 0    Musculoskeletal: Strength & Muscle Tone: within normal limits Gait & Station: Did not observe Patient leans: N/A            Psychiatric Specialty Exam:  Presentation  General Appearance: Bizarre  Eye Contact:Fair  Speech:Clear and Coherent  Speech Volume:Normal  Handedness:No data recorded  Mood and Affect  Mood:Depressed  Affect:Depressed; Flat   Thought Process  Thought Processes:Linear  Descriptions of Associations:Loose  Orientation:Full (Time, Place and Person)  Thought Content:Delusions; Paranoid Ideation; Illogical  History of Schizophrenia/Schizoaffective disorder:No  Duration of Psychotic Symptoms:No data recorded Hallucinations:Hallucinations: Other (comment) (Denies)  Ideas of Reference:Delusions; Paranoia  Suicidal Thoughts:Suicidal Thoughts: Yes, Passive SI Passive Intent and/or Plan: Without Intent; Without Plan  Homicidal Thoughts:Homicidal Thoughts: No   Sensorium  Memory:Immediate Fair  Judgment:Impaired  Insight:None   Executive Functions  Concentration:Fair  Attention Span:Fair  Recall:Fair  Fund of Knowledge:Poor  Language:Fair   Psychomotor Activity  Psychomotor Activity:Psychomotor Activity: Normal   Assets  Assets:Housing; Social Support   Sleep  Sleep:Sleep: Fair   Physical Exam: Physical Exam HENT:     Head: Normocephalic.     Nose: No rhinorrhea.  Pulmonary:     Effort: No respiratory distress.  Skin:    General: Skin is dry.  Neurological:     Mental Status: He is alert and oriented to person, place, and time.  Psychiatric:        Attention and Perception: Attention normal.        Mood and Affect: Affect is flat.  Speech: Speech normal.        Behavior: Behavior is cooperative.        Thought Content: Thought content is paranoid and delusional.  Thought content includes suicidal ideation.        Cognition and Memory: Memory normal.        Judgment: Judgment is inappropriate.    Review of Systems  Constitutional:  Negative for diaphoresis.  HENT:  Negative for hearing loss.   Respiratory:  Negative for cough and shortness of breath.   Neurological:  Negative for tremors.  Psychiatric/Behavioral:  Positive for depression and suicidal ideas.    Blood pressure (!) 143/97, pulse 89, temperature 98 F (36.7 C), resp. rate 16, SpO2 99 %. There is no height or weight on file to calculate BMI.  Treatment Plan Summary: Daily contact with patient to assess and evaluate symptoms and progress in treatment, Medication management, and Plan : Recommend admission to inpatient psychiatry.  Reviewed with EDP.  Disposition: Recommend psychiatric Inpatient admission when medically cleared.  Ronny Flurry, NP 04/24/2022 12:12 PM

## 2022-04-24 NOTE — BH Assessment (Signed)
Adult MH  Referral information for Psychiatric Hospitalization faxed to:   [facility] ([phone]-or- [phone]),   [facility] ([phone]),   [facility] ([phone] -or- [phone]),   [facility] ([phone]---[phone]---[phone]),   [facility] ([phone] or [phone])   [facility] ([phone] or [phone]),   [facility] ([phone]) 

## 2022-04-24 NOTE — ED Notes (Signed)
Pt dressed out with this RN, Garment/textile technologist. Belongings include: Black shoes [employer] Cell phone Black socks [employer] Black jacket

## 2022-04-24 NOTE — ED Notes (Signed)
[facility]  CONSULT  DONE  PENDING  PLACEMENT 

## 2022-04-24 NOTE — ED Provider Notes (Signed)
[facility] Provider Note    Event Date/Time   First MD Initiated Contact with Patient 04/24/22 1025     (approximate)   History   IVC   HPI  Roger [name] is a 32 y.o. male past medical history of bipolar disorder, alcohol use and cannabis use disorder who presents because of depression.  Patient's grandmother called police who placed him under IVC.  Patient says he feels hopeless and has thoughts that life would be better off if he were not present.  Denies actual plan to harm himself.  Denies drug or alcohol use.  Says he is not leaving the house is not when he is showering or eating.  Lives with his grandma says he has been his main support system.  Patient is not taking any medication for depression or bipolar disorder.  He tells me his only fear is swelling and output of LABA.     Past Medical History:  Diagnosis Date   Depression    Headache    Substance abuse [facility])     Patient Active Problem List   Diagnosis Date Noted   Bipolar affective disorder, currently manic, moderate (HCC) 04/01/2021   Bipolar affective disorder, manic, moderate (HCC) 03/31/2021   Bipolar affective disorder, mixed, severe, with psychotic behavior (HCC) 07/16/2019   Tobacco use disorder 11/25/2014   Alcohol use disorder, severe, dependence (HCC) 11/25/2014   Cannabis use disorder, severe, dependence (HCC) 11/25/2014   Stimulant use disorder (cocaine-amphetamines) 11/25/2014   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   Opioid use disorder, moderate, dependence (HCC) 11/25/2014   Sedative, hypnotic, 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      Excl.  in GC?     Most recent vital signs: Vitals:   04/24/22 1014  BP: (!) 143/97  Pulse: 89  Resp: 16  Temp: 98 F (36.7 C)  SpO2: 99%     General: Awake, no distress.  CV:  Good peripheral perfusion.  Resp:  Normal effort.  Abd:  No distention.  Neuro:             Awake, Alert, Oriented x 3  Other:  Patient has flat affect, does not make eye contact, does not appear to be reacting to any internal stimuli he is calm and cooperative   ED Results / Procedures / Treatments  Labs (all labs ordered are listed, but only abnormal results are displayed) Labs Reviewed  CBC - Abnormal; Notable for the following components:      Result Value   WBC 10.9 (*)    All other components within normal limits  COMPREHENSIVE METABOLIC PANEL  ETHANOL  SALICYLATE LEVEL  ACETAMINOPHEN LEVEL  URINE DRUG SCREEN, QUALITATIVE ([facility] ONLY)     EKG     RADIOLOGY    PROCEDURES:  Critical Care performed: No  Procedures   MEDICATIONS ORDERED IN  ED: Medications - No data to display   IMPRESSION / MDM / [place] / ED COURSE  I reviewed the triage vital signs and the nursing notes.                              Patient's presentation is most consistent with exacerbation of chronic illness.  Differential diagnosis includes, but is not limited to, major depressive episode, medication noncompliance, substance-induced mood disorder  The patient is a 32 year old male with underlying bipolar disorder who presents because of depression.  He is under IVC, his grandmother who he lives with called police and he was placed under IVC.  Patient tells me he does not feel that he is worried he is showering or eating tells me he does not feel that he is worried he is showering or eating feels that life would be better off if you are not there.  Thinks his life does not matter.  Denies an actual plan to harm himself.  Denies drug or alcohol use.  Patient is flat but calm and cooperative no  agitation at this time.  Vitals are reassuring he has no medical complaints.  I am concerned about patient's depression and feel that he is a risk to himself.  Will uphold IVC.  Will consult psychiatry.  The patient has been placed in psychiatric observation due to the need to provide a safe environment for the patient while obtaining psychiatric consultation and evaluation, as well as ongoing medical and medication management to treat the patient's condition.  The patient has been placed under full IVC at this time.        FINAL CLINICAL IMPRESSION(S) / ED DIAGNOSES   Final diagnoses:  Depression, unspecified depression type     Rx / DC Orders   ED Discharge Orders     None        Note:  This document was prepared using Dragon voice recognition software and may include unintentional dictation errors.   Rada Hay, MD 04/24/22 [phone]

## 2022-04-24 NOTE — ED Notes (Signed)
Pt given lunch tray and water at this time. 

## 2022-04-24 NOTE — BH Assessment (Signed)
Comprehensive Clinical Assessment (CCA) Note  04/24/2022 Roger [name] [facility] [id number]  Gypsy Balsam, 32 year old male who presents to [facility] ED involuntarily for treatment. Per triage note, Pt to ED BPD IVC for SI. Pt states he has not been bathing for months because does not feel like he deserves it. Pt keeps saying "I am not worthy." When asked about plan, pt states does not feel like will harm self until "things get crazier."   During TTS assessment pt presents alert and oriented x 4, restless but cooperative, and mood-congruent with affect. The pt does not appear to be responding to internal or external stimuli. Neither is the pt presenting with any delusional thinking. Pt verified the information provided to triage RN.   Pt identifies his main complaint to be that he saw a shadow figure walk past the window. Patient reports he was feeling quite depressed a few days ago and made mention of it to his grandmother, whom he lives with. Patient reports he feels as if it would be best if he would just disappear. "My life has no purpose." Patient states he is not working, endorsing symptoms of depression and has a specific delusion of falling into a lava pit. Patient denies using any illicit substances and alcohol. Pt denies current SI/HI/AH/VH but states he would inflict self-harm if "things got worse." Patient reports he is not taking any medications and does not see an outside provider. Patient states he believes the medication is what is causing him to have these irrational thoughts.    Per Crystal, NP, pt is recommended for inpatient psychiatric admission.  Chief Complaint:  Chief Complaint  Patient presents with   IVC   Visit Diagnosis: Bipolar affective disorder, mixed, severe, with psychotic behavior    CCA Screening, Triage and Referral (STR)  Patient Reported Information How did you hear about [place]? Family/Friend  Referral name: No data recorded Referral phone number: No data  recorded  Whom do you see for routine medical problems? No data recorded Practice/Facility Name: No data recorded Practice/Facility Phone Number: No data recorded Name of Contact: No data recorded Contact Number: No data recorded Contact Fax Number: No data recorded Prescriber Name: No data recorded Prescriber Address (if known): No data recorded  What Is the Reason for Your Visit/Call Today? Patient's grandmother placed patient under IVC due to his bizarre behaviors and SI.  How Long Has This Been Causing You Problems? <Week  What Do You Feel Would Help You the Most Today? Treatment for Depression or other mood problem; Medication(s)   Have You Recently Been in Any Inpatient Treatment (Hospital/Detox/Crisis Center/28-Day Program)? No data recorded Name/Location of Program/Hospital:No data recorded How Long Were You There? No data recorded When Were You Discharged? No data recorded  Have You Ever Received Services From [facility] Before? No data recorded Who Do You See at [facility]? No data recorded  Have You Recently Had Any Thoughts About Hurting Yourself? Yes  Are You Planning to Commit Suicide/Harm Yourself At This time? No   Have you Recently Had Thoughts About Hurting Someone Karolee Ohs? No  Explanation: No data recorded  Have You Used Any Alcohol or Drugs in the Past 24 Hours? No  How Long Ago Did You Use Drugs or Alcohol? No data recorded What Did You Use and How Much? No data recorded  Do You Currently Have a Therapist/Psychiatrist? No  Name of Therapist/Psychiatrist: No data recorded  Have You Been Recently Discharged From Any Office Practice or Programs?  No  Explanation of Discharge From Practice/Program: No data recorded    CCA Screening Triage Referral Assessment Type of Contact: Face-to-Face  Is this Initial or Reassessment? No data recorded Date Telepsych consult ordered in [facility]:  No data recorded Time Telepsych consult ordered in [facility]:  No data  recorded  Patient Reported Information Reviewed? No data recorded Patient Left Without Being Seen? No data recorded Reason for Not Completing Assessment: No data recorded  Collateral Involvement: None provided   Does Patient Have a Court Appointed Legal Guardian? No data recorded Name and Contact of Legal Guardian: No data recorded If Minor and Not Living with Parent(s), Who has Custody? No data recorded Is CPS involved or ever been involved? No data recorded Is APS involved or ever been involved? No data recorded  Patient Determined To Be At Risk for Harm To Self or Others Based on Review of Patient Reported Information or Presenting Complaint? No  Method: No data recorded Availability of Means: No data recorded Intent: No data recorded Notification Required: No data recorded Additional Information for Danger to Others Potential: No data recorded Additional Comments for Danger to Others Potential: No data recorded Are There Guns or Other Weapons in Your Home? No data recorded Types of Guns/Weapons: No data recorded Are These Weapons Safely Secured?                            No data recorded Who Could Verify You Are Able To Have These Secured: No data recorded Do You Have any Outstanding Charges, Pending Court Dates, Parole/Probation? No data recorded Contacted To Inform of Risk of Harm To Self or Others: No data recorded  Location of Assessment: [facility] ED   Does Patient Present under Involuntary Commitment? Yes  IVC Papers Initial File Date: No data recorded  [place] of Residence: [place]   Patient Currently Receiving the Following Services: Not Receiving Services   Determination of Need: Emergent (2 hours)   Options For Referral: ED Visit; Inpatient Hospitalization; Medication Management; Outpatient Therapy     CCA Biopsychosocial Intake/Chief Complaint:  No data recorded Current Symptoms/Problems: No data recorded  Patient Reported  Schizophrenia/Schizoaffective Diagnosis in Past: No   Strengths: Has support from family and able to communicate his basic needs.  Preferences: No data recorded Abilities: No data recorded  Type of Services Patient Feels are Needed: No data recorded  Initial Clinical Notes/Concerns: No data recorded  Mental Health Symptoms Depression:   Change in energy/activity; Difficulty Concentrating; Irritability; Hopelessness; Increase/decrease in appetite; Worthlessness   Duration of Depressive symptoms:  Greater than two weeks   Mania:   N/A   Anxiety:    Restlessness; Difficulty concentrating; Worrying   Psychosis:   Delusions   Duration of Psychotic symptoms:  Less than six months   Trauma:   N/A   Obsessions:   N/A   Compulsions:   N/A   Inattention:   N/A   Hyperactivity/Impulsivity:   N/A   Oppositional/Defiant Behaviors:   N/A   Emotional Irregularity:   N/A   Other Mood/Personality Symptoms:  No data recorded   Mental Status Exam Appearance and self-care  Stature:   Average   Weight:   Average weight   Clothing:   Neat/clean; Age-appropriate   Grooming:   Normal   Cosmetic use:   Age appropriate   Posture/gait:   Bizarre   Motor activity:   Not Remarkable   Sensorium  Attention:   Normal  Concentration:   Normal   Orientation:   X5   Recall/memory:   Normal   Affect and Mood  Affect:   Anxious; Appropriate; Constricted; Depressed   Mood:   Anxious; Depressed; Worthless; Pessimistic; Hopeless   Relating  Eye contact:   Staring   Facial expression:   Responsive; Constricted   Attitude toward examiner:   Cooperative   Thought and Language  Speech flow:  Clear and Coherent   Thought content:   Appropriate to Mood and Circumstances   Preoccupation:  No data recorded  Hallucinations:   None   Organization:  No data recorded  [employer] of Knowledge:   Fair   Intelligence:   Average    Abstraction:   Normal   Judgement:   Impaired   Reality Testing:   Distorted   Insight:   Fair; Poor   Decision Making:   Impulsive   Social Functioning  Social Maturity:   Irresponsible   Social Judgement:   "Street Smart"   Stress  Stressors:  No data recorded  Coping Ability:   Exhausted; Overwhelmed   Skill Deficits:   None   Supports:   Family     Religion:    Leisure/Recreation:    Exercise/Diet: Exercise/Diet Do You Have Any Trouble Sleeping?: No   CCA Employment/Education Employment/Work Situation: Employment / Work Situation Employment Situation: Unemployed Patient's Job has Been Impacted by Current Illness: No Has Patient ever Been in Passenger transport manager?: No  Education:     CCA Family/Childhood History Family and Relationship History: Family history Marital status: Single Does patient have children?: No  Childhood History:     Child/Adolescent Assessment:     CCA Substance Use Alcohol/Drug Use: Alcohol / Drug Use Pain Medications: See PTA Prescriptions: See PTA Over the Counter: See PTA History of alcohol / drug use?: No history of alcohol / drug abuse                         ASAM's:  Six Dimensions of Multidimensional Assessment  Dimension 1:  Acute Intoxication and/or Withdrawal Potential:      Dimension 2:  Biomedical Conditions and Complications:      Dimension 3:  Emotional, Behavioral, or Cognitive Conditions and Complications:     Dimension 4:  Readiness to Change:     Dimension 5:  Relapse, Continued use, or Continued Problem Potential:     Dimension 6:  Recovery/Living Environment:     ASAM Severity Score:    ASAM Recommended Level of Treatment:     Substance use Disorder (SUD)    Recommendations for Services/Supports/Treatments:    DSM5 Diagnoses: Patient Active Problem List   Diagnosis Date Noted   Bipolar affective disorder, currently manic, moderate (HCC) 04/01/2021   Bipolar affective  disorder, manic, moderate ([place]) 03/31/2021   Bipolar affective disorder, mixed, severe, with psychotic behavior ([place]) 07/16/2019   Tobacco use disorder 11/25/2014   Alcohol use disorder, severe, dependence ([place]) 11/25/2014   Cannabis use disorder, severe, dependence ([place]) 11/25/2014   Stimulant use disorder (cocaine-amphetamines) 11/25/2014   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   Opioid use disorder, moderate, dependence ([place]) 11/25/2014   Sedative, hypnotic, 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

## 2022-04-24 NOTE — ED Triage Notes (Signed)
Pt to ED BPD IVC for SI. Pt states he has not been bathing for months because does not feel like he deserves it.  Pt keeps saying "I am not worthy" When asked about plan, pt states does not feel like will harm self until "things get crazier"

## 2022-04-25 NOTE — ED Notes (Signed)
IVC/Accepted to [facility] this Am after 8:00

## 2022-04-25 NOTE — ED Notes (Signed)
[place]  [facility]  called  to  transport  pt  to  [facility] 

## 2023-01-18 ENCOUNTER — Emergency Department
Admission: EM | Admit: 2023-01-18 | Discharge: 2023-01-20 | Disposition: A | Discharge status: Other Acute Inpatient Hospital | Payer: MEDICAID | Attending: Emergency Medicine | Admitting: Emergency Medicine

## 2023-01-18 ENCOUNTER — Other Ambulatory Visit: Payer: Self-pay

## 2023-01-18 DIAGNOSIS — Z87891 Personal history of nicotine dependence: Secondary | ICD-10-CM | POA: Diagnosis not present

## 2023-01-18 DIAGNOSIS — R441 Visual hallucinations: Secondary | ICD-10-CM | POA: Insufficient documentation

## 2023-01-18 DIAGNOSIS — R440 Auditory hallucinations: Secondary | ICD-10-CM | POA: Diagnosis present

## 2023-01-18 LAB — ACETAMINOPHEN LEVEL: Acetaminophen (Tylenol), Serum: <10 ug/mL — ABNORMAL LOW (ref 10–30)

## 2023-01-18 LAB — CBC
HCT: 48.6 % (ref 39.0–52.0)
Hemoglobin: 16.6 g/dL (ref 13.0–17.0)
MCH: 29.2 pg (ref 26.0–34.0)
MCHC: 34.2 g/dL (ref 30.0–36.0)
MCV: 85.4 fL (ref 80.0–100.0)
Platelets: 310 10*3/uL (ref 150–400)
RBC: 5.69 MIL/uL (ref 4.22–5.81)
RDW: 13.3 % (ref 11.5–15.5)
WBC: 7.3 10*3/uL (ref 4.0–10.5)
nRBC: 0 % (ref 0.0–0.2)

## 2023-01-18 LAB — URINE DRUG SCREEN, QUALITATIVE (ARMC ONLY)
Amphetamines, Ur Screen: NOT DETECTED
Barbiturates, Ur Screen: NOT DETECTED
Benzodiazepine, Ur Scrn: NOT DETECTED
Cannabinoid 50 Ng, Ur ~~LOC~~: POSITIVE — AB
Cocaine Metabolite,Ur ~~LOC~~: POSITIVE — AB
MDMA (Ecstasy)Ur Screen: NOT DETECTED
Methadone Scn, Ur: NOT DETECTED
Opiate, Ur Screen: NOT DETECTED
Phencyclidine (PCP) Ur S: NOT DETECTED
Tricyclic, Ur Screen: NOT DETECTED

## 2023-01-18 LAB — COMPREHENSIVE METABOLIC PANEL
ALT: 15 U/L (ref 0–44)
AST: 17 U/L (ref 15–41)
Albumin: 4.3 g/dL (ref 3.5–5.0)
Alkaline Phosphatase: 74 U/L (ref 38–126)
Anion gap: 9 (ref 5–15)
BUN: 11 mg/dL (ref 6–20)
CO2: 28 mmol/L (ref 22–32)
Calcium: 9.2 mg/dL (ref 8.9–10.3)
Chloride: 99 mmol/L (ref 98–111)
Creatinine, Ser: 1.1 mg/dL (ref 0.61–1.24)
GFR, Estimated: >60 mL/min (ref >60)
Glucose, Bld: 99 mg/dL (ref 70–99)
Potassium: 3.6 mmol/L (ref 3.5–5.1)
Sodium: 136 mmol/L (ref 135–145)
Total Bilirubin: 1.7 mg/dL — ABNORMAL HIGH (ref 0.3–1.2)
Total Protein: 7.7 g/dL (ref 6.5–8.1)

## 2023-01-18 LAB — SALICYLATE LEVEL: Salicylate Lvl: <7 mg/dL — ABNORMAL LOW (ref 7.0–30.0)

## 2023-01-18 LAB — ETHANOL: Alcohol, Ethyl (B): <10 mg/dL (ref <10)

## 2023-01-18 NOTE — ED Triage Notes (Signed)
Pt presents via POV with mobile crisis c/o auditory and visual hallucinations. Reports hx bipolar. Reports non-compliant with meds, has not taken meds in years. Reports not eating or has not showered in weeks. Denies SI/HI. Calm and cooperative in triage.

## 2023-01-18 NOTE — ED Provider Notes (Signed)
[facility] Provider Note    Event Date/Time   First MD Initiated Contact with Patient 01/18/23 2047     (approximate)   History   Psychiatric Evaluation   HPI  Roger [name] is a 32 y.o. male past medical history significant for bipolar disorder who presents to the emergency department for auditory and visual hallucinations.  States that he was telling his grandmother about some of the things he has been thinking recently, states that that led to mobile crisis and him coming into the emergency department.  States that he has not been on any medications.  States that he has not been eating or showering in a couple of weeks.  Denies any suicidal or homicidal ideation.     Physical Exam   Triage Vital Signs: ED Triage Vitals [01/18/23 1903]  Encounter Vitals Group     BP (!) 135/102     Systolic BP Percentile      Diastolic BP Percentile      Pulse Rate 85     Resp 16     Temp 98.5 F (36.9 C)     Temp Source Oral     SpO2 98 %     Weight 135 lb (61.2 kg)     Height 5\' 6"  (1.676 m)     Head Circumference      Peak Flow      Pain Score 0     Pain Loc      Pain Education      Exclude from Growth Chart     Most recent vital signs: Vitals:   01/18/23 1903  BP: (!) 135/102  Pulse: 85  Resp: 16  Temp: 98.5 F (36.9 C)  SpO2: 98%    Physical Exam Constitutional:      Appearance: He is well-developed.  HENT:     Head: Atraumatic.  Eyes:     Conjunctiva/sclera: Conjunctivae normal.  Cardiovascular:     Rate and Rhythm: Regular rhythm.  Pulmonary:     Effort: No respiratory distress.  Musculoskeletal:     Cervical back: Normal range of motion.  Skin:    General: Skin is warm.  Neurological:     Mental Status: He is alert. Mental status is at baseline.  Psychiatric:        Attention and Perception: Attention normal.        Mood and Affect: Mood normal.        Speech: Speech normal.        Thought Content: Thought  content is delusional. Thought content does not include homicidal or suicidal ideation. Thought content does not include homicidal or suicidal plan.     IMPRESSION / MDM / ASSESSMENT AND PLAN / ED COURSE  I reviewed the triage vital signs and the nursing notes.  Differential diagnosis including acute mania, hypomania, electrolyte abnormality, substance abuse  No tachycardic or bradycardic dysrhythmias while on cardiac telemetry.   LABS (all labs ordered are listed, but only abnormal results are displayed) Labs interpreted as -    Labs Reviewed  COMPREHENSIVE METABOLIC PANEL - Abnormal; Notable for the following components:      Result Value   Total Bilirubin 1.7 (*)    All other components within normal limits  SALICYLATE LEVEL - Abnormal; Notable for the following components:   Salicylate Lvl <7.0 (*)    All other components within normal limits  ACETAMINOPHEN LEVEL - Abnormal; Notable for the following components:   Acetaminophen (Tylenol),  Serum <10 (*)    All other components within normal limits  URINE DRUG SCREEN, QUALITATIVE ([facility] ONLY) - Abnormal; Notable for the following components:   Cocaine Metabolite,Ur [place] POSITIVE (*)    Cannabinoid 50 Ng, Ur [place] POSITIVE (*)    All other components within normal limits  CBC  ETHANOL    MDM    UDS positive for cocaine and cannabis.  Consulted psychiatry for further evaluation.  Patient here voluntarily.   The patient has been placed in psychiatric observation due to the need to provide a safe environment for the patient while obtaining psychiatric consultation and evaluation, as well as ongoing medical and medication management to treat the patient's condition.  The patient has not been placed under full IVC at this time.   PROCEDURES:  Critical Care performed: No  Procedures  Patient's presentation is most consistent with acute presentation with potential threat to life or bodily function.   MEDICATIONS ORDERED IN  ED: Medications - No data to display  FINAL CLINICAL IMPRESSION(S) / ED DIAGNOSES   Final diagnoses:  Auditory hallucination     Rx / DC Orders   ED Discharge Orders     None        Note:  This document was prepared using Dragon voice recognition software and may include unintentional dictation errors.   Corena Herter, MD 01/19/23 0010

## 2023-01-18 NOTE — ED Notes (Signed)
Mobile crisis:  Roger [name]  [phone]

## 2023-01-18 NOTE — ED Notes (Signed)
Pt dressed out in hospital attire.   Belongings collected:   2 black shoes 2 white socks 1 black pants 1 black underwear 1 blue jacket 1 black shirt

## 2023-01-19 DIAGNOSIS — R440 Auditory hallucinations: Secondary | ICD-10-CM

## 2023-01-19 MED ORDER — HYDROXYZINE HCL 25 MG PO TABS
25.0000 mg | ORAL_TABLET | Freq: Three times a day (TID) | ORAL | Status: DC | PRN
  Administered 2023-01-20: 25 mg via ORAL
  Filled 2023-01-19: qty 1

## 2023-01-19 MED ORDER — RISPERIDONE 1 MG PO TABS
1.0000 mg | ORAL_TABLET | Freq: Two times a day (BID) | ORAL | Status: DC
  Administered 2023-01-19 – 2023-01-20 (×3): 1 mg via ORAL
  Filled 2023-01-19 (×3): qty 1

## 2023-01-19 MED ORDER — TRAZODONE HCL 50 MG PO TABS
50.0000 mg | ORAL_TABLET | Freq: Every day | ORAL | Status: DC
  Administered 2023-01-19: 50 mg via ORAL
  Filled 2023-01-19: qty 1

## 2023-01-19 NOTE — BH Assessment (Addendum)
Per [facility] AC Tresa Endo S.), patient to be referred out of system.  Referral information for Psychiatric Hospitalization faxed to;   [facility] [phone]- [phone]), no available bed.  [facility] [phone]),   [address] [phone]),  [place] [phone], [phone], [phone] or [phone]),   [place] [phone]--- [phone]--- [phone])  [address] [phone]),   [facility] [phone] -or- [phone]),   [facility] [phone] phone-- [phone])  [facility] [phone]),  [place] [phone])  [facility] [phone]).  [facility] [phone])

## 2023-01-19 NOTE — ED Notes (Signed)
PATIENT BED AVAILABLE AT 11AM ON 01/20/23   VOL/ Patient has been accepted to [facility].  Patient assigned to [address] Accepting physician is Dr. Sherrian Divers.  Call report to [phone].  Representative was Precious.

## 2023-01-19 NOTE — Consult Note (Signed)
[facility] Psych ED Progress Note  01/19/2023 6:15 AM Roger [name] Roger [name]  MRN:  [id number]   Method of visit?: Virtual (Location of provider: [facility] Location of patient: [facility] ED B1 Names and roles of anyone participating in the consult/assessment: Bishop Limbo PMHNP, Andee Poles LCAS/TTS This service was provided via telemedicine using a 2-way, interactive audio, and video technology. )  Principal Problem: <principal problem not specified> Diagnosis:  Active Problems:   * No active hospital problems. *  Total Time spent with patient: 20 minutes  Past Psychiatric History: Bipolar Disorder vs Schizoaffective Disorder, Polysubstance Use  HPI: Roger [name] is a 32 year old male with a past psychiatric history of bipolar disorder versus schizoaffective disorder, polysubstance abuse,depression, and self-reported PTSD.  He presents to [facility] regional ED today with mobile crisis reporting auditory and visual hallucinations.  Patient also reported in triage that he has not eaten or showered in weeks.  Roger [name] describes seeing shadows that seem to follow him around he reports the shadows talk to him and tell him to do things.  He does not elaborate on what they say but does state "if they are supposed to be my friends they should not be treating me like this.  I can act up to but ain't no point in that."  Roger [name] endorses marijuana use and alcohol use, he denies use of any other illicit substances however, UDS positive for cocaine and cannabinoid.  During evaluation Roger [name] is seated in no acute distress.  He is alert, oriented x 4, calm and cooperative. His mood is euthymic with full range affect.  He has normal speech and behavior.  On evaluation patient is reporting auditory and visual hallucinations, his thought content is delusional.  He denies suicidal/self-harm/homicidal ideation.  Discussed recommendation for inpatient psychiatric admission for stabilization and  treatment. Discussed milieu and expectations.  Patient provided opportunities for questions and his questions were answered to his expressed satisfaction.  Patient is in agreement with recommendations.   Past Medical History:  Past Medical History:  Diagnosis Date   Depression    Headache    Substance abuse (HCC)     Past Surgical History:  Procedure Laterality Date   FEMUR FRACTURE SURGERY     Family History:  Family History  Problem Relation Age of Onset   Alcohol abuse Father    Social History:  Social History   Substance and Sexual Activity  Alcohol Use Yes   Alcohol/week: 1.0 standard drink of alcohol   Types: 1 Shots of liquor per week     Social History   Substance and Sexual Activity  Drug Use Yes   Types: Amphetamines, Cocaine, Marijuana, Benzodiazepines, MDMA (Ecstacy), Hydrocodone    Social History   Socioeconomic History   Marital status: Single    Spouse name: Not on file   Number of children: Not on file   Years of education: Not on file   Highest education level: Not on file  Occupational History   Not on file  Tobacco Use   Smoking status: Former    Current packs/day: 0.50    Average packs/day: 0.5 packs/day for 10.0 years (5.0 ttl pk-yrs)    Types: Cigarettes   Smokeless tobacco: Former    Quit date: 11/24/2014  Vaping Use   Vaping status: Never Used  Substance and Sexual Activity   Alcohol use: Yes    Alcohol/week: 1.0 standard drink of alcohol    Types: 1 Shots of liquor per week  Drug use: Yes    Types: Amphetamines, Cocaine, Marijuana, Benzodiazepines, MDMA (Ecstacy), Hydrocodone   Sexual activity: Yes    Birth control/protection: None  Other Topics Concern   Not on file  Social History Narrative   Not on file   Social Determinants of Health   Financial Resource Strain: Not on file  Food Insecurity: Not on file  Transportation Needs: Not on file  Physical Activity: Not on file  Stress: Not on file  Social Connections: Not on  file    Sleep: Good  Appetite:  Good  Current Medications: Current Facility-Administered Medications  Medication Dose Route Frequency Provider Last Rate Last Admin   hydrOXYzine (ATARAX) tablet 25 mg  25 mg Oral TID PRN [name], NP       risperiDONE (RISPERDAL) tablet 1 mg  1 mg Oral BID [name], NP       traZODone (DESYREL) tablet 50 mg  50 mg Oral QHS [name], NP       Current Outpatient Medications  Medication Sig Dispense Refill   paliperidone (INVEGA SUSTENNA) 156 MG/ML SUSY injection Inject 1 mL (156 mg total) into the muscle once for 1 dose. 1 mL 0    Lab Results:  Results for orders placed or performed during the hospital encounter of 01/18/23 (from the past 48 hour(s))  Comprehensive metabolic panel     Status: Abnormal   Collection Time: 01/18/23  7:05 PM  Result Value Ref Range   Sodium 136 135 - 145 mmol/L   Potassium 3.6 3.5 - 5.1 mmol/L   Chloride 99 98 - 111 mmol/L   CO2 28 22 - 32 mmol/L   Glucose, Bld 99 70 - 99 mg/dL    Comment: Glucose reference range applies only to samples taken after fasting for at least 8 hours.   BUN 11 6 - 20 mg/dL   Creatinine, Ser 1.30 0.61 - 1.24 mg/dL   Calcium 9.2 8.9 - 86.5 mg/dL   Total Protein 7.7 6.5 - 8.1 g/dL   Albumin 4.3 3.5 - 5.0 g/dL   AST 17 15 - 41 U/L   ALT 15 0 - 44 U/L   Alkaline Phosphatase 74 38 - 126 U/L   Total Bilirubin 1.7 (H) 0.3 - 1.2 mg/dL   GFR, Estimated >78 >46 mL/min    Comment: (NOTE) Calculated using the CKD-EPI Creatinine Equation (2021)    Anion gap 9 5 - 15    Comment: Performed at [facility], [address] [place], [place] [address]  Salicylate level     Status: Abnormal   Collection Time: 01/18/23  7:05 PM  Result Value Ref Range   Salicylate Lvl <7.0 (L) 7.0 - 30.0 mg/dL    Comment: Performed at [facility], [address] [place], [place] [address]  Acetaminophen level     Status: Abnormal   Collection Time: 01/18/23  7:05 PM  Result Value  Ref Range   Acetaminophen (Tylenol), Serum <10 (L) 10 - 30 ug/mL    Comment: (NOTE) Therapeutic concentrations vary significantly. A range of 10-30 ug/mL  may be an effective concentration for many patients. However, some  are best treated at concentrations outside of this range. Acetaminophen concentrations >150 ug/mL at 4 hours after ingestion  and >50 ug/mL at 12 hours after ingestion are often associated with  toxic reactions.  Performed at [facility], [address] [place], [place] [address]   cbc     Status: None  Collection Time: 01/18/23  7:05 PM  Result Value Ref Range   WBC 7.3 4.0 - 10.5 K/uL   RBC 5.69 4.22 - 5.81 MIL/uL   Hemoglobin 16.6 13.0 - 17.0 g/dL   HCT 30.8 65.7 - 84.6 %   MCV 85.4 80.0 - 100.0 fL   MCH 29.2 26.0 - 34.0 pg   MCHC 34.2 30.0 - 36.0 g/dL   RDW 96.2 95.2 - 84.1 %   Platelets 310 150 - 400 K/uL   nRBC 0.0 0.0 - 0.2 %    Comment: Performed at [facility], [address], [place], [place] [address]  Urine Drug Screen, Qualitative     Status: Abnormal   Collection Time: 01/18/23  7:05 PM  Result Value Ref Range   Tricyclic, Ur Screen NONE DETECTED NONE DETECTED   Amphetamines, Ur Screen NONE DETECTED NONE DETECTED   MDMA (Ecstasy)Ur Screen NONE DETECTED NONE DETECTED   Cocaine Metabolite,Ur [place] POSITIVE (A) NONE DETECTED   Opiate, Ur Screen NONE DETECTED NONE DETECTED   Phencyclidine (PCP) Ur S NONE DETECTED NONE DETECTED   Cannabinoid 50 Ng, Ur Scissors POSITIVE (A) NONE DETECTED   Barbiturates, Ur Screen NONE DETECTED NONE DETECTED   Benzodiazepine, Ur Scrn NONE DETECTED NONE DETECTED   Methadone Scn, Ur NONE DETECTED NONE DETECTED    Comment: (NOTE) Tricyclics + metabolites, urine    Cutoff 1000 ng/mL Amphetamines + metabolites, urine  Cutoff 1000 ng/mL MDMA (Ecstasy), urine              Cutoff 500 ng/mL Cocaine Metabolite, urine          Cutoff 300 ng/mL Opiate + metabolites, urine        Cutoff 300 ng/mL Phencyclidine  (PCP), urine         Cutoff 25 ng/mL Cannabinoid, urine                 Cutoff 50 ng/mL Barbiturates + metabolites, urine  Cutoff 200 ng/mL Benzodiazepine, urine              Cutoff 200 ng/mL Methadone, urine                   Cutoff 300 ng/mL  The urine drug screen provides only a preliminary, unconfirmed analytical test result and should not be used for non-medical purposes. Clinical consideration and professional judgment should be applied to any positive drug screen result due to possible interfering substances. A more specific alternate chemical method must be used in order to obtain a confirmed analytical result. Gas chromatography / mass spectrometry (GC/MS) is the preferred confirm atory method. Performed at [facility], [address] [place], [place] [address]   Ethanol     Status: None   Collection Time: 01/18/23  7:07 PM  Result Value Ref Range   Alcohol, Ethyl (B) <10 <10 mg/dL    Comment: (NOTE) Lowest detectable limit for serum alcohol is 10 mg/dL.  For medical purposes only. Performed at [facility], [address] [place], [place] [address]     Blood Alcohol level:  Lab Results  Component Value Date   [facility] <10 01/18/2023   ETH <10 04/24/2022    Physical Findings: AIMS:  , ,  ,  ,    CIWA:    COWS:     Musculoskeletal: Strength & Muscle Tone: within normal limits Gait & Station: normal Patient leans: N/A  Psychiatric Specialty Exam:  Presentation  General Appearance:  Casual  Eye Contact:  Fair  Speech: Clear and Coherent  Speech Volume: Normal  Handedness:Right   Mood and Affect  Mood: Euthymic  Affect: Full Range   Thought Process  Thought Processes: Disorganized  Descriptions of Associations:Circumstantial  Orientation:Full (Time, Place and Person)  Thought Content:Delusions  History of Schizophrenia/Schizoaffective disorder:Yes  Duration of Psychotic Symptoms:Greater than six  months  Hallucinations:Hallucinations: Auditory; Visual; Command Description of Command Hallucinations: Shadows speaking to him and sometimes telling him to do things, he will not elaborate on what they are saying Description of Auditory Hallucinations: Shadows speaking to him and sometimes telling him to do things, he will not elaborate on what they are saying. He does state they are not always nice Description of Visual Hallucinations: Shadows  Ideas of Reference:Delusions  Suicidal Thoughts:Suicidal Thoughts: No  Homicidal Thoughts:Homicidal Thoughts: No   Sensorium  Memory: Immediate Poor; Recent Poor; Remote Poor  Judgment: Impaired  Insight: Poor   Executive Functions  Concentration: Fair  Attention Span: Fair  Recall: Fair  Fund of Knowledge: Fair  Language: Fair   Psychomotor Activity  Psychomotor Activity:Psychomotor Activity: Normal   Assets  Assets: Manufacturing systems engineer; Housing; Resilience   Sleep  Sleep:Sleep: Good    Physical Exam: Physical Exam Vitals and nursing note reviewed.  HENT:     Head: Normocephalic.  Cardiovascular:     Rate and Rhythm: Normal rate.  Pulmonary:     Effort: Pulmonary effort is normal.  Musculoskeletal:        General: Normal range of motion.  Neurological:     General: No focal deficit present.     Mental Status: He is alert and oriented to person, place, and time.  Psychiatric:        Attention and Perception: He perceives auditory and visual hallucinations.        Thought Content: Thought content is delusional.    ROS Blood pressure (!) 135/102, pulse 85, temperature 98.5 F (36.9 C), temperature source Oral, resp. rate 16, height 5\' 6"  (1.676 m), weight 61.2 kg, SpO2 98%. Body mass index is 21.79 kg/m.  Treatment Plan Summary: Recommend inpatient psychiatric admission once medically cleared Start Risperdal 1 mg PO BID for AVH and mood Start Trazodone 50 mg at bedtime PRN for sleep Start  Hydroxyzine 25 mg PO TID PRN for anxiety   This document was prepared using Dragon voice recognition software and may include unintentional dictation errors.  Bishop Limbo DNP, MBA, PMHNP-BC, FNP-BC  Psychiatric Mental Health Nurse Practitioner [facility]

## 2023-01-19 NOTE — ED Notes (Signed)
PT ask to take a shower. Provided clean scrubs and shower supplies so he may shower. PT provided evening snack and ate 100%

## 2023-01-19 NOTE — ED Notes (Signed)
Breakfast and orange juice provided by staff.

## 2023-01-19 NOTE — ED Notes (Signed)
Vol /psych consult ordered/ pending  

## 2023-01-19 NOTE — BH Assessment (Signed)
Writer spoke with the patient to complete an updated/reassessment. Patient continue to be psychotic. He admits to having A/H, as well as some paranoia.

## 2023-01-19 NOTE — ED Provider Notes (Signed)
Emergency Medicine Observation Re-evaluation Note  Roger [name] is a 32 y.o. male, seen on rounds today.  Pt initially presented to the ED for complaints of Psychiatric Evaluation Currently, the patient is resting comfortably.  Physical Exam  BP (!) 135/102   Pulse 85   Temp 98.5 F (36.9 C) (Oral)   Resp 16   Ht 5\' 6"  (1.676 m)   Wt 61.2 kg   SpO2 98%   BMI 21.79 kg/m  Physical Exam General: No acute distress Cardiac: No cyanosis Lungs: Normal work of breathing Psych: Calm and cooperative  ED Course / MDM  EKG:   I have reviewed the labs performed to date as well as medications administered while in observation.  Recent changes in the last 24 hours include no acute events.  Plan  Current plan is for psychiatric disposition.    Janith Lima, MD 01/19/23 [phone]
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
## 2023-01-19 NOTE — BH Assessment (Signed)
PATIENT BED AVAILABLE AT 11AM ON 01/20/23  Patient has been accepted to [facility].  Patient assigned to [address] Accepting physician is Dr. Sherrian Divers.  Call report to [phone].  Representative was Precious.   ER Staff is aware of it:  [facility] ER Secretary  Dr. Rosalia Hammers, ER MD  Feliz Beam Patient's Nurse

## 2023-01-19 NOTE — ED Notes (Signed)
vol/consult done/recommend psychiatric inpatient admission when medically cleared.. 

## 2023-01-20 NOTE — ED Notes (Signed)
EMTALA Reviewed by this RN.  

## 2023-01-20 NOTE — ED Notes (Signed)
Pt is A/Ox 4, Mr Roger [name] declines any SI/HI stated that he endorses both A/V hallucinations.  Pt agreeable and is being transported to [facility] they verbalized understanding.  All Belongings accounted for and given to safe transport. Pt transported  via [facility]

## 2023-01-20 NOTE — ED Provider Notes (Signed)
Emergency Medicine Observation Re-evaluation Note  Roger [name] is a 32 y.o. male, seen on rounds today.  Pt initially presented to the ED for complaints of Psychiatric Evaluation  Currently, the patient is no acute distress. Pt resting- no issues per bhu nurse   Physical Exam  Blood pressure (!) 119/93, pulse 86, temperature 98.7 F (37.1 C), temperature source Oral, resp. rate 16, height 5\' 6"  (1.676 m), weight 61.2 kg, SpO2 96%.  Physical Exam General: No apparent distress Pulm: Normal WOB Psych: resting     ED Course / MDM     I have reviewed the labs performed to date as well as medications administered while in observation.  Recent changes in the last 24 hours include none   Plan   Current plan is to continue to wait for psych placement today Patient is not under full IVC at this time.   Concha Se, MD 01/20/23 [phone]

## 2023-10-01 ENCOUNTER — Other Ambulatory Visit: Payer: Self-pay

## 2023-10-01 ENCOUNTER — Emergency Department
Admission: EM | Admit: 2023-10-01 | Discharge: 2023-10-02 | Disposition: A | Discharge status: Other Acute Inpatient Hospital | Payer: MEDICAID | Attending: Emergency Medicine | Admitting: Emergency Medicine

## 2023-10-01 DIAGNOSIS — F1290 Cannabis use, unspecified, uncomplicated: Secondary | ICD-10-CM | POA: Diagnosis not present

## 2023-10-01 DIAGNOSIS — F3164 Bipolar disorder, current episode mixed, severe, with psychotic features: Secondary | ICD-10-CM | POA: Insufficient documentation

## 2023-10-01 DIAGNOSIS — R451 Restlessness and agitation: Secondary | ICD-10-CM | POA: Insufficient documentation

## 2023-10-01 LAB — COMPREHENSIVE METABOLIC PANEL WITH GFR
ALT: 11 U/L (ref 0–44)
AST: 18 U/L (ref 15–41)
Albumin: 4.3 g/dL (ref 3.5–5.0)
Alkaline Phosphatase: 63 U/L (ref 38–126)
Anion gap: 7 (ref 5–15)
BUN: 16 mg/dL (ref 6–20)
CO2: 27 mmol/L (ref 22–32)
Calcium: 9.5 mg/dL (ref 8.9–10.3)
Chloride: 106 mmol/L (ref 98–111)
Creatinine, Ser: 1.03 mg/dL (ref 0.61–1.24)
GFR, Estimated: >60 mL/min (ref >60)
Glucose, Bld: 110 mg/dL — ABNORMAL HIGH (ref 70–99)
Potassium: 3.7 mmol/L (ref 3.5–5.1)
Sodium: 140 mmol/L (ref 135–145)
Total Bilirubin: 1.3 mg/dL — ABNORMAL HIGH (ref 0.0–1.2)
Total Protein: 7.6 g/dL (ref 6.5–8.1)

## 2023-10-01 LAB — URINE DRUG SCREEN, QUALITATIVE (ARMC ONLY)
Amphetamines, Ur Screen: NOT DETECTED
Barbiturates, Ur Screen: NOT DETECTED
Benzodiazepine, Ur Scrn: NOT DETECTED
Cannabinoid 50 Ng, Ur ~~LOC~~: POSITIVE — AB
Cocaine Metabolite,Ur ~~LOC~~: NOT DETECTED
MDMA (Ecstasy)Ur Screen: NOT DETECTED
Methadone Scn, Ur: NOT DETECTED
Opiate, Ur Screen: NOT DETECTED
Phencyclidine (PCP) Ur S: NOT DETECTED
Tricyclic, Ur Screen: NOT DETECTED

## 2023-10-01 LAB — CBC
HCT: 45.6 % (ref 39.0–52.0)
Hemoglobin: 15.5 g/dL (ref 13.0–17.0)
MCH: 30.2 pg (ref 26.0–34.0)
MCHC: 34 g/dL (ref 30.0–36.0)
MCV: 88.7 fL (ref 80.0–100.0)
Platelets: 268 10*3/uL (ref 150–400)
RBC: 5.14 MIL/uL (ref 4.22–5.81)
RDW: 13.6 % (ref 11.5–15.5)
WBC: 7.9 10*3/uL (ref 4.0–10.5)
nRBC: 0 % (ref 0.0–0.2)

## 2023-10-01 LAB — ETHANOL: Alcohol, Ethyl (B): <15 mg/dL (ref <15)

## 2023-10-01 MED ORDER — NICOTINE 21 MG/24HR TD PT24
21.0000 mg | MEDICATED_PATCH | Freq: Once | TRANSDERMAL | Status: DC
  Administered 2023-10-01: 21 mg via TRANSDERMAL
  Filled 2023-10-01: qty 1

## 2023-10-01 NOTE — ED Notes (Signed)
Pt. To BHU from ED ambulatory without difficulty, to room  BHU 3. Report from Dorian RN. Pt. Is alert and oriented, warm and dry in no distress. Pt. Denies SI, HI, and AVH. Pt. Calm and cooperative. Pt. Made aware of security cameras and Q15 minute rounds. Pt. Encouraged to let Nursing staff know of any concerns or needs.   ENVIRONMENTAL ASSESSMENT Potentially harmful objects out of patient reach: Yes.   Personal belongings secured: Yes.   Patient dressed in hospital provided attire only: Yes.   Plastic bags out of patient reach: Yes.   Patient care equipment (cords, cables, call bells, lines, and drains) shortened, removed, or accounted for: Yes.   Equipment and supplies removed from bottom of stretcher: Yes.   Potentially toxic materials out of patient reach: Yes.   Sharps container removed or out of patient reach: Yes.    

## 2023-10-01 NOTE — ED Notes (Signed)
 Pt now states his grandmother called someone to come and get him from her house. He states  they don't want him there. Pt doesn't know why.

## 2023-10-01 NOTE — ED Notes (Signed)
 Pt dressed out into hospital attire. Pt belongings to include: Pack of cigs 2 black socks 2 black shoes 1 black shirt 1 black shorts 1 black underwear

## 2023-10-01 NOTE — ED Provider Notes (Addendum)
 [facility] Provider Note    Event Date/Time   First MD Initiated Contact with Patient 10/01/23 1807     (approximate)   History   mental evaluation   HPI  Roger [name] is a 33 y.o. male who presents to the emergency department today for unclear reasons.  Patient himself states he has been feeling fine.  He denies any recent illness.  He states that he just thinks the people wanted him out of the house he was at.  He denies any SI HI.  Denies any hallucinations.     Physical Exam   Triage Vital Signs: ED Triage Vitals  Encounter Vitals Group     BP 10/01/23 1759 124/89     Girls Systolic BP Percentile --      Girls Diastolic BP Percentile --      Boys Systolic BP Percentile --      Boys Diastolic BP Percentile --      Pulse Rate 10/01/23 1759 75     Resp 10/01/23 1759 18     Temp 10/01/23 1759 98.9 F (37.2 C)     Temp src --      SpO2 10/01/23 1759 100 %     Weight 10/01/23 1755 135 lb (61.2 kg)     Height 10/01/23 1755 5' 6 (1.676 m)     Head Circumference --      Peak Flow --      Pain Score 10/01/23 1755 0     Pain Loc --      Pain Education --      Exclude from Growth Chart --     Most recent vital signs: Vitals:   10/01/23 1759  BP: 124/89  Pulse: 75  Resp: 18  Temp: 98.9 F (37.2 C)  SpO2: 100%   General: Awake, alert, oriented. CV:  Good peripheral perfusion. Regular rate and rhythm. Resp:  Normal effort. Lungs clear. Abd:  No distention.    ED Results / Procedures / Treatments   Labs (all labs ordered are listed, but only abnormal results are displayed) Labs Reviewed  COMPREHENSIVE METABOLIC PANEL WITH GFR - Abnormal; Notable for the following components:      Result Value   Glucose, Bld 110 (*)    Total Bilirubin 1.3 (*)    All other components within normal limits  URINE DRUG SCREEN, QUALITATIVE ([facility] ONLY) - Abnormal; Notable for the following components:   Cannabinoid 50 Ng, Ur  POSITIVE (*)     All other components within normal limits  ETHANOL  CBC     EKG  None   RADIOLOGY None   PROCEDURES:  Critical Care performed: No    MEDICATIONS ORDERED IN ED: Medications - No data to display   IMPRESSION / MDM / ASSESSMENT AND PLAN / ED COURSE  I reviewed the triage vital signs and the nursing notes.                              Differential diagnosis includes, but is not limited to, substance use, housing situation, psychiatric illness  Patient's presentation is most consistent with acute presentation with potential threat to life or bodily function.  Patient presented to the emergency department today without any complaints.  Denies any SI or HI.  Does not appear to be responding to any internal stimuli on my exam.  Is willing to cooperative and answers all questions.  I  did offer for patient to speak to psychiatry however patient declined stating he did not feel he needed to.  He did ask to be discharged.  At this time I think that is reasonable.  In attempting to discharge patient family was called. Apparently they do have concern about patient's mental status. Will order psychiatric evaluation however patient does nor meet IVC criteria at this time.    FINAL CLINICAL IMPRESSION(S) / ED DIAGNOSES   Final diagnoses:  Marijuana use        Note:  This document was prepared using Dragon voice recognition software and may include unintentional dictation errors.    Marylynn Soho, MD 10/01/23 1919    Marylynn Soho, MD 10/01/23 2027

## 2023-10-01 NOTE — ED Notes (Signed)
 Patient walking around unit looking up like he is seeing something and responding to internal stimuli. Patient redirected to room and educated that dayroom is currently closed.Patient

## 2023-10-01 NOTE — ED Triage Notes (Signed)
 Pt comes with c/o not sure. Pt states stranger brought him here and he doesn't know why. Pt states no SI or HI. Pt denies hearing voices. Pt does have psych hx on file. Pt states it might be safer in here. Pt doesn't elaborate any more.   Pt states he drank beer today and does use marijuana. Pt denies any pain.

## 2023-10-01 NOTE — ED Notes (Addendum)
 This RN and [place], EDT asking pt for information to discharge pt - states there is no one to call and no one that will come and get him. This RN called and spoke with pts grandmother Adriana Hopping) who states pt has not been taking meds, not been eating, responding to auditory hallucinations, not bathing, and trespassing on church property. Reports pt dug large hole/ditch in back yard last week that was big enough to put a body in. Grandmother does not feel that it is safe for the pt to be discharged back to the home. Paula Born [phone] from [facility] states she filed an APS case to request legal guardianship for pt, and is also working with [facility] to try to find long term placement for pt.

## 2023-10-01 NOTE — ED Notes (Signed)
 Pt out of room again, pacing back and forth between doorway of his room and bathroom in dayroom. This NT and security entered the unit to check on pt, pt looking around corners of room and ceiling. NT and security redirected pt to room. 5 minutes later pt seen on security cameras tipping chair over and kneeling behind it, staring at the back of the chair stating that he saw something. Pt redirected by security and told that he is safe here.

## 2023-10-02 ENCOUNTER — Encounter: Payer: Self-pay | Admitting: Nurse Practitioner

## 2023-10-02 ENCOUNTER — Inpatient Hospital Stay
Admission: AD | Admit: 2023-10-02 | Discharge: 2023-10-26 | DRG: 885 | Disposition: A | Discharge status: Home / Self Care | Payer: MEDICAID | Source: Intra-hospital | Attending: Psychiatry | Admitting: Psychiatry

## 2023-10-02 ENCOUNTER — Other Ambulatory Visit: Payer: Self-pay

## 2023-10-02 DIAGNOSIS — F481 Depersonalization-derealization syndrome: Secondary | ICD-10-CM | POA: Diagnosis present

## 2023-10-02 DIAGNOSIS — Z9151 Personal history of suicidal behavior: Secondary | ICD-10-CM | POA: Diagnosis not present

## 2023-10-02 DIAGNOSIS — F209 Schizophrenia, unspecified: Principal | ICD-10-CM | POA: Diagnosis present

## 2023-10-02 DIAGNOSIS — Z87891 Personal history of nicotine dependence: Secondary | ICD-10-CM | POA: Diagnosis not present

## 2023-10-02 DIAGNOSIS — F22 Delusional disorders: Secondary | ICD-10-CM | POA: Diagnosis not present

## 2023-10-02 DIAGNOSIS — F250 Schizoaffective disorder, bipolar type: Secondary | ICD-10-CM | POA: Diagnosis present

## 2023-10-02 DIAGNOSIS — Z91148 Patient's other noncompliance with medication regimen for other reason: Secondary | ICD-10-CM

## 2023-10-02 DIAGNOSIS — F29 Unspecified psychosis not due to a substance or known physiological condition: Secondary | ICD-10-CM | POA: Diagnosis present

## 2023-10-02 DIAGNOSIS — R410 Disorientation, unspecified: Secondary | ICD-10-CM | POA: Diagnosis present

## 2023-10-02 DIAGNOSIS — R45851 Suicidal ideations: Secondary | ICD-10-CM | POA: Diagnosis present

## 2023-10-02 DIAGNOSIS — Z888 Allergy status to other drugs, medicaments and biological substances status: Secondary | ICD-10-CM | POA: Diagnosis not present

## 2023-10-02 DIAGNOSIS — F3164 Bipolar disorder, current episode mixed, severe, with psychotic features: Secondary | ICD-10-CM | POA: Diagnosis not present

## 2023-10-02 DIAGNOSIS — F3163 Bipolar disorder, current episode mixed, severe, without psychotic features: Secondary | ICD-10-CM | POA: Diagnosis not present

## 2023-10-02 MED ORDER — ACETAMINOPHEN 325 MG PO TABS
650.0000 mg | ORAL_TABLET | Freq: Four times a day (QID) | ORAL | Status: DC | PRN
  Administered 2023-10-07 – 2023-10-25 (×10): 650 mg via ORAL
  Filled 2023-10-02 (×10): qty 2

## 2023-10-02 MED ORDER — OLANZAPINE 10 MG PO TABS
10.0000 mg | ORAL_TABLET | Freq: Every day | ORAL | Status: DC
  Administered 2023-10-02: 10 mg via ORAL

## 2023-10-02 MED ORDER — ALUM & MAG HYDROXIDE-SIMETH 200-200-20 MG/5ML PO SUSP: 30.0000 mL | ORAL | Status: DC | PRN

## 2023-10-02 MED ORDER — MAGNESIUM HYDROXIDE 400 MG/5ML PO SUSP: 30.0000 mL | Freq: Every day | ORAL | Status: DC | PRN

## 2023-10-02 MED ORDER — OLANZAPINE 10 MG IM SOLR: 10.0000 mg | Freq: Three times a day (TID) | INTRAMUSCULAR | Status: DC | PRN

## 2023-10-02 MED ORDER — OLANZAPINE 10 MG IM SOLR: 5.0000 mg | Freq: Three times a day (TID) | INTRAMUSCULAR | Status: DC | PRN

## 2023-10-02 MED ORDER — OLANZAPINE 5 MG PO TBDP: 5.0000 mg | ORAL_TABLET | Freq: Three times a day (TID) | ORAL | Status: DC | PRN

## 2023-10-02 MED ORDER — HYDROXYZINE HCL 25 MG PO TABS
25.0000 mg | ORAL_TABLET | Freq: Three times a day (TID) | ORAL | Status: DC | PRN
  Administered 2023-10-03 – 2023-10-06 (×2): 25 mg via ORAL
  Filled 2023-10-02 (×2): qty 1

## 2023-10-02 MED ORDER — OLANZAPINE 10 MG PO TABS
10.0000 mg | ORAL_TABLET | Freq: Once | ORAL | Status: AC
  Administered 2023-10-02: 10 mg via ORAL
  Filled 2023-10-02: qty 1

## 2023-10-02 MED ORDER — BENZTROPINE MESYLATE 1 MG PO TABS: 1.0000 mg | ORAL_TABLET | Freq: Two times a day (BID) | ORAL | Status: DC | PRN

## 2023-10-02 MED ORDER — BENZTROPINE MESYLATE 1 MG/ML IJ SOLN: 1.0000 mg | Freq: Two times a day (BID) | INTRAMUSCULAR | Status: DC | PRN

## 2023-10-02 MED ORDER — LORAZEPAM 2 MG/ML IJ SOLN: 2.0000 mg | Freq: Two times a day (BID) | INTRAMUSCULAR | Status: DC | PRN

## 2023-10-02 NOTE — BH Assessment (Signed)
 Comprehensive Clinical Assessment (CCA) Screening, Triage and Referral Note  10/02/2023 Roger [name] [id number]  Chief Complaint:   33 y.o. male who presents to the emergency department today for unclear reasons.  Patient himself states he has been feeling fine.  He denies any recent illness.  He states that he just thinks the people wanted him out of the house he was at.  He denies any SI HI.  Denies any hallucinations.   Per grandmother report, pt has not been taking meds, not been eating, responding to auditory hallucinations, not bathing, and trespassing on church property. Reports pt dug large hole/ditch in back yard last week that was big enough to put a body in. Grandmother does not feel that it is safe for the pt to be discharged back to the home.   Visit Diagnosis: Delusions  Patient Reported Information How did you hear about us ? Family/Friend  What Is the Reason for Your Visit/Call Today? Patient reports he does not know why he is here.  How Long Has This Been Causing You Problems? 1 wk - 1 month  What Do You Feel Would Help You the Most Today? Treatment for Depression or other mood problem   Have You Recently Had Any Thoughts About Hurting Yourself? No  Are You Planning to Commit Suicide/Harm Yourself At This time? No   Have you Recently Had Thoughts About Hurting Someone Roger [name]? No  Are You Planning to Harm Someone at This Time? No  Explanation: No data recorded  Have You Used Any Alcohol or Drugs in the Past 24 Hours? No  How Long Ago Did You Use Drugs or Alcohol? No data recorded What Did You Use and How Much? Marijuana   Do You Currently Have a Therapist/Psychiatrist? No  Name of Therapist/Psychiatrist: No data recorded  Have You Been Recently Discharged From Any Office Practice or Programs? No  Explanation of Discharge From Practice/Program: No data recorded   CCA Screening Triage Referral Assessment Type of Contact:  Face-to-Face  Telemedicine Service Delivery:   Is this Initial or Reassessment?   Date Telepsych consult ordered in CHL:    Time Telepsych consult ordered in [facility]L:    Location of Assessment: [facility] ED  Provider Location: [facility] ED    Collateral Involvement: No data recorded  Does Patient Have a Court Appointed Legal Guardian? No data recorded Name and Contact of Legal Guardian: No data recorded If Minor and Not Living with Parent(s), Who has Custody? No data recorded Is CPS involved or ever been involved? Never  Is APS involved or ever been involved? Never   Patient Determined To Be At Risk for Harm To Self or Others Based on Review of Patient Reported Information or Presenting Complaint? No  Method: No Plan  Availability of Means: No access or NA  Intent: Vague intent or NA  Notification Required: No need or identified person  Additional Information for Danger to Others Potential: Active psychosis  Additional Comments for Danger to Others Potential: No data recorded Are There Guns or Other Weapons in Your Home? No  Types of Guns/Weapons: No data recorded Are These Weapons Safely Secured?                            No data recorded Who Could Verify You Are Able To Have These Secured: No data recorded Do You Have any Outstanding Charges, Pending Court Dates, Parole/Probation? None disclosed  Contacted To Inform of Risk of Harm  To Self or Others: No data recorded  Does Patient Present under Involuntary Commitment? No    [place] of Residence: [place]   Patient Currently Receiving the Following Services: Not Receiving Services   Determination of Need: Emergent (2 hours)   Options For Referral: Medication Management; Inpatient Hospitalization   Disposition Recommendation per psychiatric provider: Plan Post Discharge/Psychiatric Care Follow-up resources Patient to reassessed by psychiatry due to earlier technical difficulties.  Zollie Hipp,  Counselor

## 2023-10-02 NOTE — ED Notes (Signed)
Vol /psych consult ordered/ pending  

## 2023-10-02 NOTE — TOC Initial Note (Signed)
 Transition of Care [facility]) - Initial/Assessment Note    Patient Details  Name: Roger [name] MRN: [id number] Date of Birth: 09-08-1990  Transition of Care [facility]) CM/SW Contact:    Elsie Halo, RN Phone Number: 10/02/2023, 12:53 PM  Clinical Narrative:                  TOC voluntary admitted and is transitioning to inpt psych. The patient is from home with his grandmother. The patient's grandmother will likely allow him to return to home once he receives treatment.   The patient is awaiting an inpt bed. No TOC needs identified, please outreach to [facility] if needs arise.    Expected Discharge Plan: Home/Self Care Barriers to Discharge: Continued Medical Work up   Patient Goals and CMS Choice            Expected Discharge Plan and Services       Living arrangements for the past 2 months: Single Family Home                                      Prior Living Arrangements/Services Living arrangements for the past 2 months: Single Family Home Lives with:: Relatives Database administrator)                   Activities of Daily Living      Permission Sought/Granted                  Emotional Assessment Appearance:: Appears stated age     Orientation: : Oriented to Place, Oriented to Self, Oriented to  Time   Psych Involvement: No (comment)  Admission diagnosis:  Mental Eval Patient Active Problem List   Diagnosis Date Noted   Bipolar affective disorder, currently manic, moderate (HCC) 04/01/2021   Bipolar affective disorder, manic, moderate (HCC) 03/31/2021   Bipolar affective disorder, mixed, severe, with psychotic behavior (HCC) 07/16/2019   Tobacco use disorder 11/25/2014   Alcohol use disorder, severe, dependence (HCC) 11/25/2014   Cannabis use disorder, severe, dependence (HCC) 11/25/2014   Stimulant use disorder (cocaine-amphetamines) 11/25/2014   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   Opioid use disorder, moderate, dependence (HCC)  11/25/2014   Sedative, hypnotic, 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   PCP:  Pcp, No Pharmacy:   [facility] [address] [place] [address] Phone: [phone] Fax: [phone]     Social Drivers of Health (SDOH) Social History: SDOH Screenings   Alcohol Screen: Low Risk  (04/01/2021)  Tobacco Use: Medium Risk (10/01/2023)   SDOH Interventions:     Readmission Risk Interventions     No data to display

## 2023-10-02 NOTE — Progress Notes (Signed)
 A 33 year old male presented to the ED for a mental health evaluation. Due to equipment malfunction, the assessment could not be completed during the current shift. The patient remains stable at this time and will be reassessed during the next shift.

## 2023-10-02 NOTE — Progress Notes (Signed)
   10/02/23 1545  Psych Admission Type (Psych Patients Only)  Admission Status Involuntary  Psychosocial Assessment  Patient Complaints Other (Comment) (patient does not endorse reasons for hospitalization; denies everything but is observed talking to ppl that are not present.)  Eye Contact Brief  Facial Expression Blank;Flat  Affect Apprehensive;Fearful  Speech Soft;Incoherent  Interaction Cautious;Childlike  Motor Activity Restless  Appearance/Hygiene Body odor;Disheveled  Behavior Characteristics Cooperative;Elopement risk;Guarded;Resistant to care;Wandering risk;Restless  Mood Suspicious;Apprehensive  Aggressive Behavior  Effect No apparent injury  Thought Process  Coherency Disorganized;Incoherent;Loose associations  Content Blaming others  Delusions Paranoid  Perception Hallucinations  Hallucination Auditory;Visual  Judgment Poor  Confusion Mild  Danger to Self  Current suicidal ideation? Denies  Danger to Others  Danger to Others None reported or observed

## 2023-10-02 NOTE — Plan of Care (Signed)
  Problem: Education: Goal: Knowledge of [facility] Education information/materials will improve 10/02/2023 1634 by Derenda Flax, RN Outcome: Not Progressing 10/02/2023 1634 by Derenda Flax, RN Outcome: Not Progressing Goal: Emotional status will improve 10/02/2023 1634 by Derenda Flax, RN Outcome: Not Progressing 10/02/2023 1634 by Derenda Flax, RN Outcome: Not Progressing Goal: Mental status will improve 10/02/2023 1634 by Derenda Flax, RN Outcome: Not Progressing 10/02/2023 1634 by Derenda Flax, RN Outcome: Not Progressing Goal: Verbalization of understanding the information provided will improve 10/02/2023 1634 by Derenda Flax, RN Outcome: Not Progressing 10/02/2023 1634 by Derenda Flax, RN Outcome: Not Progressing   Problem: Activity: Goal: Interest or engagement in activities will improve 10/02/2023 1634 by Derenda Flax, RN Outcome: Not Progressing 10/02/2023 1634 by Derenda Flax, RN Outcome: Not Progressing Goal: Sleeping patterns will improve 10/02/2023 1634 by Derenda Flax, RN Outcome: Not Progressing 10/02/2023 1634 by Derenda Flax, RN Outcome: Not Progressing   Problem: Coping: Goal: Ability to verbalize frustrations and anger appropriately will improve 10/02/2023 1634 by Derenda Flax, RN Outcome: Not Progressing 10/02/2023 1634 by Derenda Flax, RN Outcome: Not Progressing Goal: Ability to demonstrate self-control will improve 10/02/2023 1634 by Derenda Flax, RN Outcome: Not Progressing 10/02/2023 1634 by Derenda Flax, RN Outcome: Not Progressing   Problem: Health Behavior/Discharge Planning: Goal: Identification of resources available to assist in meeting health care needs will improve 10/02/2023 1634 by Derenda Flax, RN Outcome: Not Progressing 10/02/2023 1634 by Derenda Flax, RN Outcome: Not Progressing Goal: Compliance with treatment plan for underlying cause of  condition will improve 10/02/2023 1634 by Derenda Flax, RN Outcome: Not Progressing 10/02/2023 1634 by Derenda Flax, RN Outcome: Not Progressing   Problem: Physical Regulation: Goal: Ability to maintain clinical measurements within normal limits will improve 10/02/2023 1634 by Derenda Flax, RN Outcome: Not Progressing 10/02/2023 1634 by Derenda Flax, RN Outcome: Not Progressing   Problem: Safety: Goal: Periods of time without injury will increase 10/02/2023 1634 by Derenda Flax, RN Outcome: Not Progressing 10/02/2023 1634 by Derenda Flax, RN Outcome: Not Progressing

## 2023-10-02 NOTE — ED Notes (Signed)
 PT  PLACED  UNDER  IVC PAPERS   PER  DR  PADUCHOWSKI  MD  INFORMED  KATE  RN

## 2023-10-02 NOTE — ED Notes (Signed)
 Hospital meal provided, pt tolerated w/o complaints.  Waste discarded appropriately.

## 2023-10-02 NOTE — ED Provider Notes (Signed)
-----------------------------------------   1:42 PM on 10/02/2023 ----------------------------------------- Patient continues to be intermittently agitated, in reviewing the patient's notes patient has paranoia possible psychosis.  Psychiatry has seen and evaluated the patient and believe the patient needs inpatient admission for further treatment.  Given the patient's paranoia possible psychosis and intermittent agitation we will place the patient under an IVC order so that the patient may be adequately treated and cared for by psychiatry.   Ruth Cove, MD 10/02/23 1343

## 2023-10-02 NOTE — Progress Notes (Signed)
   10/02/23 1545  Charting Type  Charting Type Admission  Focused Reassessment No Changes Psychosocial  Focused Reassessment Changes Noted Psychosocial  Safety Check Verification  Has the RN verified the 15 minute safety check completion? Yes  Neurological  Neuro (WDL) WDL  HEENT  HEENT (WDL) WDL  Respiratory  Respiratory (WDL) WDL  Cardiac  Cardiac (WDL) WDL  Vascular  Vascular (WDL) WDL  Integumentary  Integumentary (WDL) X  Staff Member Assisting with Skin Assessment on Admission Demetria RN  Skin Color Appropriate for ethnicity  Skin Condition Dry  Skin Integrity Other (Comment) (Multiple tattoos arms and abdomen)  Skin Turgor Non-tenting  Braden Scale (Ages 8 and up)  Sensory Perceptions 4  Moisture 4  Activity 4  Mobility 4  Nutrition 2  Friction and Shear 3  Braden Scale Score 21  Musculoskeletal  Musculoskeletal (WDL) WDL  Gastrointestinal  Gastrointestinal (WDL) WDL  Last BM Date   (unknown)  GU Assessment  Genitourinary (WDL) WDL  Genitalia  Male Genitalia Intact  Neurological  Level of Consciousness Alert

## 2023-10-02 NOTE — Plan of Care (Signed)

## 2023-10-02 NOTE — ED Notes (Signed)
 Pt is A/Ox 3, Mr Vokes was able to verbalize understanding to transfer to inpatient tx facility, and is agreeable. Report called to [facility], RN .  All Belongings accounted for and given to [facility] security for transport. Receiving facility notified on departure.

## 2023-10-02 NOTE — Tx Team (Addendum)
 Initial Treatment Plan 10/02/2023 5:37 PM Roger [name] Roger [name] OZH:[id number]    PATIENT STRESSORS: Financial difficulties   Medication change or noncompliance   Substance abuse     PATIENT STRENGTHS: Physical Health    PATIENT IDENTIFIED PROBLEMS: psychosis                     DISCHARGE CRITERIA:  Withdrawal symptoms are absent or subacute and managed without 24-hour nursing intervention  PRELIMINARY DISCHARGE PLAN: Attend aftercare/continuing care group  PATIENT/FAMILY INVOLVEMENT: This treatment plan has been presented to and reviewed with the patient, Roger [name], and/or family member, Surveyor, minerals.  The patient and family have been given the opportunity to ask questions and make suggestions.  Derenda Flax, RN 10/02/2023, 5:37 PM

## 2023-10-02 NOTE — Progress Notes (Signed)
 Patient has been accepted to [facility] BMU 10/02/23 Patient assigned to room 314 Accepting physician is Dr. Gaynell Keeler Call report to [phone] Representative was [facility]   ER Staff is aware of it: Luann/Lisa ER Secretary Dr. Azalee Bolds, ER MD [name] Jerry, RN Patient's Nurse   Macky Sayres, [facility] [phone]

## 2023-10-02 NOTE — Group Note (Signed)
 Recreation Therapy Group Note   Group Topic:Other  Group Date: 10/02/2023 Start Time: 1530 End Time: 1620 Facilitators: Yvonna Herder, CTRS Location: Craft Room  Activity Description/Intervention: Therapeutic Drumming. Patients with peers and staff were given the opportunity to engage in a leader facilitated [facility] Empowerment Drumming Circle with staff from the [employer], in partnership with [employer]. Teaching laboratory technician and trained [employer], Kathlyne Parchment leading with LRT observing and documenting intervention and pt response. This evidenced-based practice targets 7 areas of health and wellbeing in the human experience including: stress-reduction, exercise, self-expression, camaraderie/support, nurturing, spirituality, and music-making (leisure).    Goal Area(s) Addresses:  Patient will engage in pro-social way in music group.  Patient will follow directions of drum leader on the first prompt. Patient will demonstrate no behavioral issues during group.  Patient will identify if a reduction in stress level occurs as a result of participation in therapeutic drum circle.     Affect/Mood: N/A   Participation Level: Did not attend    Clinical Observations/Individualized Feedback: Patient did not attend group due to being a new admission.   Plan: Continue to engage patient in RT group sessions 2-3x/week.   Deatrice Factor, LRT, CTRS 10/02/2023 5:34 PM

## 2023-10-02 NOTE — Consult Note (Signed)
 [facility] Consult Note  Patient Name: Roger [name] MRN: [id number] DOB: Aug 25, 1990 DATE OF Consult: 10/02/2023  PRIMARY PSYCHIATRIC DIAGNOSES  1.  Bipolar 1 disorder, currently mixed, with psychotic features 2.  Paranoid ideation 3.  Delusions  RECOMMENDATIONS  Recommendations:  Medication recommendations: agree with olanzapine  10 mg po daily for empiric mood stabilization; would also consider providing dose of long-acting injectable (Invega ) if available  Non-Medication/therapeutic recommendations: med management MH follow-up after discharge  Is inpatient psychiatric hospitalization recommended for this patient? Yes (Explain why): paranoia, delusions, non-adherent to treatment, bizarre behavior  Follow-Up Telepsychiatry C/L services: We will continue to follow this patient with you until stabilized or discharged.  If you have any questions or concerns, please call our TeleCare Coordination service at  [phone] and ask for myself or the provider on-call.  Communication: Treatment team members (and family members if applicable) who were involved in treatment/care discussions and planning, and with whom we spoke or engaged with via secure text/chat, include the following: RN  Thank you for involving us  in the care of this patient. If you have any additional questions or concerns, please call [phone] and ask for me or the provider on-call.  TELEPSYCHIATRY ATTESTATION & CONSENT  As the provider for this telehealth consult, I attest that I verified the patient's identity using two separate identifiers, introduced myself to the patient, provided my credentials, disclosed my location, and performed this encounter via a HIPAA-compliant, real-time, face-to-face, two-way, interactive audio and video platform and with the full consent and agreement of the patient (or guardian as applicable.)  Patient physical location: ED at [facility] Telehealth provider  physical location: home office in state of [place] .  Video start time: 958a CST (Central Time) Video end time: 1016a CST (Central Time)  IDENTIFYING DATA  Roger [name] is a 33 y.o. year-old male for whom a psychiatric consultation has been ordered by the primary provider. The patient was identified using two separate identifiers.  CHIEF COMPLAINT/REASON FOR CONSULT   Abnormal behavior; paranoia; bipolar disorder   HISTORY OF PRESENT ILLNESS (HPI)  The patient is a 33 year old man with chart history of bipolar vs schizoaffective disorder; hx of polysubstance abuse, depression; he presents to ED after, reportedly, his grandmother contacted crisis services due to reported concerns re: patient responding to internal stimuli, not bathing, trespassing on church property, and digging a large hole in the backyard last week with unclear intent. Grandmother has expressed concerns with patient being discharged and, per chart, she is pursuing legal guardianship for patient via APS with hope for long-term placement.  The patient's ED course has included a 10 mg PO dose of olanzapine  as the patient was reportedly responding to internal stimuli, appearing to hallucinate, pacing and requiring redirection due to excessive checking and paranoid behaviors.   The patient, when seen, appears somewhat tired but he does participate in interview, although with a suspicious manner at times; he reports not knowing where he is, though he'll describe (vaguely) someone came to his home and brought him to the hospital, though he denies there being any specific issues happening PTA. When asked re: the hole he dug, he states he was planning to swim in it (denying intent to drown or suicide with it), but couldn't because of the shovel, and he will defensively ask ([I] can't dig holes? Why can't I dig holes?) queries re: this and similarly about reports of his wandering onto church property (I can't go look at  things? -  patient). He is unable to recall the year. He denies substance use then will endorse THC use two days ago (UDS+ THC). He rejects that bipolar disorder is a real thing and denies any previous mental health admissions. He states what he needs is to get as far away from everyone as he can as they are acting weird/unusual to him. He will repeat that he is fine though at other times states he does not know how he is, he will vaguely allude to SI then deny it (via I'm fine) when asked to clarify. He denies HI. He will endorse paranoia.  The patient has a history of suicide attempts, Psychiatric admissions, long-acting injectable treatment, prior involuntary commitment. Per chart, was admitted 01/2023 for AVH, paranoia. Some of his acute disorientation could be related to sedation from meds, but collateral concerns suggest both that he is not functioning well at community level of care and, per history, he can deteriorate to extent of significant risk to self. I do think he would benefit from further treatment and acute stabilization, and I think he is both at chronically elevated safety risk and acutely at an elevated (moderate to high) risk due to his paranoid symptoms, delusions. I would recommend admission for acute stabilization, though if he can be stabilized with medications (potentially including with resumption of his long-acting injectable, per chart was taking Invega  LAI in past) alternate disposition could be reasonable provided the patient demonstrates an ability to consistently maintain safety.   PAST PSYCHIATRIC HISTORY   Prior admissions: Yes, multiple Suicide attempts/SIB: Yes, multiple SA per chart Outpatient treatment: Currently non-adherent Prior med trials: Yes, per chart has history of Invega  LAI  Otherwise as per HPI above.  PAST MEDICAL HISTORY  Past Medical History:  Diagnosis Date   Depression    Headache    Substance abuse [facility])      HOME MEDICATIONS  Facility  Ordered Medications  Medication   nicotine  (NICODERM CQ  - dosed in mg/24 hours) patch 21 mg   [COMPLETED] OLANZapine  (ZYPREXA ) tablet 10 mg   LORazepam  (ATIVAN ) injection 2 mg     ALLERGIES  Allergies  Allergen Reactions   Haldol  [Haloperidol  Lactate]     EPS sxs    SOCIAL & SUBSTANCE USE HISTORY  Social History   Socioeconomic History   Marital status: Single    Spouse name: Not on file   Number of children: Not on file   Years of education: Not on file   Highest education level: Not on file  Occupational History   Not on file  Tobacco Use   Smoking status: Former    Current packs/day: 0.50    Average packs/day: 0.5 packs/day for 10.0 years (5.0 ttl pk-yrs)    Types: Cigarettes   Smokeless tobacco: Former    Quit date: 11/24/2014  Vaping Use   Vaping status: Never Used  Substance and Sexual Activity   Alcohol use: Yes    Alcohol/week: 1.0 standard drink of alcohol    Types: 1 Shots of liquor per week   Drug use: Yes    Types: Amphetamines, Cocaine, Marijuana, Benzodiazepines, MDMA (Ecstacy), Hydrocodone    Comment: only weed currently   Sexual activity: Yes    Birth control/protection: None  Other Topics Concern   Not on file  Social History Narrative   Not on file   Social Drivers of Health   Financial Resource Strain: Not on file  Food Insecurity: Not on file  Transportation Needs: Not on file  Physical  Activity: Not on file  Stress: Not on file  Social Connections: Not on file   Social History   Tobacco Use  Smoking Status Former   Current packs/day: 0.50   Average packs/day: 0.5 packs/day for 10.0 years (5.0 ttl pk-yrs)   Types: Cigarettes  Smokeless Tobacco Former   Quit date: 11/24/2014   Social History   Substance and Sexual Activity  Alcohol Use Yes   Alcohol/week: 1.0 standard drink of alcohol   Types: 1 Shots of liquor per week   Social History   Substance and Sexual Activity  Drug Use Yes   Types: Amphetamines, Cocaine, Marijuana,  Benzodiazepines, MDMA (Ecstacy), Hydrocodone   Comment: only weed currently    Additional pertinent information:  Reports recent THC use  FAMILY HISTORY  Family History  Problem Relation Age of Onset   Alcohol abuse Father    Family Psychiatric History (if known):  etoh abuse in father as above  MENTAL STATUS EXAM (MSE)  Mental Status Exam: General Appearance: initially covered with blanket, with prompting does sit up somewhat to participate  Orientation:  oriented to current location (though initially states he doesn't know) and self  Memory:  impaired given plurality of I don't know   Concentration:  fair at times  Recall:  impaired at times  Attention  fair  Eye Contact:  poor; eyes closed for much of interview  Speech:  slow  Language:  english  Volume:  low volume  Mood: I don't know - patient  Affect:  constricted, at times labile  Thought Process:  circumferential  Thought Content:  paranoid ideation, delusions  Suicidal Thoughts:  he denies at other times vaguely alludes to suicidal ideation but denies when asked for clarity (I'm fine - patient)  Homicidal Thoughts:  denies  Judgement:  poor  Insight:  poor  Psychomotor Activity:  no overt PMA/PMR at this time  Akathisia:  none evident  Fund of Knowledge:  fair              VITALS  Blood pressure 119/79, pulse 61, temperature 98 F (36.7 C), temperature source Oral, resp. rate 17, height 5' 6 (1.676 m), weight 61.2 kg, SpO2 97%.  LABS  Admission on 10/01/2023  Component Date Value Ref Range Status   Sodium 10/01/2023 140  135 - 145 mmol/L Final   Potassium 10/01/2023 3.7  3.5 - 5.1 mmol/L Final   Chloride 10/01/2023 106  98 - 111 mmol/L Final   CO2 10/01/2023 27  22 - 32 mmol/L Final   Glucose, Bld 10/01/2023 110 (H)  70 - 99 mg/dL Final   Glucose reference range applies only to samples taken after fasting for at least 8 hours.   BUN 10/01/2023 16  6 - 20 mg/dL Final   Creatinine, Ser 10/01/2023  1.03  0.61 - 1.24 mg/dL Final   Calcium 16/01/9603 9.5  8.9 - 10.3 mg/dL Final   Total Protein 54/12/8117 7.6  6.5 - 8.1 g/dL Final   Albumin 14/78/2956 4.3  3.5 - 5.0 g/dL Final   AST 21/30/8657 18  15 - 41 U/L Final   ALT 10/01/2023 11  0 - 44 U/L Final   Alkaline Phosphatase 10/01/2023 63  38 - 126 U/L Final   Total Bilirubin 10/01/2023 1.3 (H)  0.0 - 1.2 mg/dL Final   GFR, Estimated 10/01/2023 >60  >60 mL/min Final   Comment: (NOTE) Calculated using the CKD-EPI Creatinine Equation (2021)    Anion gap 10/01/2023 7  5 - 15  Final   Performed at [facility], [address] [place], [place] [address]   Alcohol, Ethyl (B) 10/01/2023 <15  <15 mg/dL Final   Comment: (NOTE) For medical purposes only. Performed at [facility], [address] [place], [place] [address]    WBC 10/01/2023 7.9  4.0 - 10.5 K/uL Final   RBC 10/01/2023 5.14  4.22 - 5.81 MIL/uL Final   Hemoglobin 10/01/2023 15.5  13.0 - 17.0 g/dL Final   HCT 09/81/1914 45.6  39.0 - 52.0 % Final   MCV 10/01/2023 88.7  80.0 - 100.0 fL Final   MCH 10/01/2023 30.2  26.0 - 34.0 pg Final   MCHC 10/01/2023 34.0  30.0 - 36.0 g/dL Final   RDW 78/29/5621 13.6  11.5 - 15.5 % Final   Platelets 10/01/2023 268  150 - 400 K/uL Final   nRBC 10/01/2023 0.0  0.0 - 0.2 % Final   Performed at [facility], [address] [place], [place] [address]   Tricyclic, Ur Screen 10/01/2023 NONE DETECTED  NONE DETECTED Final   Amphetamines, Ur Screen 10/01/2023 NONE DETECTED  NONE DETECTED Final   MDMA (Ecstasy)Ur Screen 10/01/2023 NONE DETECTED  NONE DETECTED Final   Cocaine Metabolite,Ur [place] 10/01/2023 NONE DETECTED  NONE DETECTED Final   Opiate, Ur Screen 10/01/2023 NONE DETECTED  NONE DETECTED Final   Phencyclidine (PCP) Ur S 10/01/2023 NONE DETECTED  NONE DETECTED Final   Cannabinoid 50 Ng, Ur [place] 10/01/2023 POSITIVE (A)  NONE DETECTED Final   Barbiturates, Ur Screen 10/01/2023 NONE DETECTED  NONE DETECTED Final    Benzodiazepine, Ur Scrn 10/01/2023 NONE DETECTED  NONE DETECTED Final   Methadone Scn, Ur 10/01/2023 NONE DETECTED  NONE DETECTED Final   Comment: (NOTE) Tricyclics + metabolites, urine    Cutoff 1000 ng/mL Amphetamines + metabolites, urine  Cutoff 1000 ng/mL MDMA (Ecstasy), urine              Cutoff 500 ng/mL Cocaine Metabolite, urine          Cutoff 300 ng/mL Opiate + metabolites, urine        Cutoff 300 ng/mL Phencyclidine (PCP), urine         Cutoff 25 ng/mL Cannabinoid, urine                 Cutoff 50 ng/mL Barbiturates + metabolites, urine  Cutoff 200 ng/mL Benzodiazepine, urine              Cutoff 200 ng/mL Methadone, urine                   Cutoff 300 ng/mL  The urine drug screen provides only a preliminary, unconfirmed analytical test result and should not be used for non-medical purposes. Clinical consideration and professional judgment should be applied to any positive drug screen result due to possible interfering substances. A more specific alternate chemical method must be used in order to obtain a confirmed analytical result. Gas chromatography / mass spectrometry (GC/MS) is the preferred confirm                          atory method. Performed at [facility], [address] [place], [place] [address]     PSYCHIATRIC REVIEW OF SYSTEMS (ROS)  ROS: Notable for the following relevant positive findings: Delusions, paranoia, hallucinations, grandiosity, thought disorganization  Additional findings:      Musculoskeletal: No abnormal movements observed      Gait & Station:  not observed, laying in bed      Pain Screening: none endorsed        RISK FORMULATION/ASSESSMENT  Is the patient experiencing any suicidal or homicidal ideations: no  Protective factors considered for safety management: prior treatment response  Risk factors/concerns considered for safety management:  Prior attempt Substance abuse/dependence Impulsivity Unwillingness to seek  help Male gender  Is there a safety management plan with the patient and treatment team to minimize risk factors and promote protective factors: Yes           Explain: meds Is crisis care placement or psychiatric hospitalization recommended: Yes     Based on my current evaluation and risk assessment, patient is determined at this time to be at:  Moderate Risk  *RISK ASSESSMENT Risk assessment is a dynamic process; it is possible that this patient's condition, and risk level, may change. This should be re-evaluated and managed over time as appropriate. Please re-consult psychiatric consult services if additional assistance is needed in terms of risk assessment and management. If your team decides to discharge this patient, please advise the patient how to best access emergency psychiatric services, or to call 911, if their condition worsens or they feel unsafe in any way.   Alferd Angers, MD Telepsychiatry Consult Services

## 2023-10-02 NOTE — Plan of Care (Signed)
  Problem: Education: Goal: Knowledge of  General Education information/materials will improve 10/02/2023 1739 by Derenda Flax, RN Outcome: Not Progressing 10/02/2023 1634 by Derenda Flax, RN Outcome: Not Progressing 10/02/2023 1634 by Derenda Flax, RN Outcome: Not Progressing Goal: Emotional status will improve 10/02/2023 1739 by Derenda Flax, RN Outcome: Not Progressing 10/02/2023 1634 by Derenda Flax, RN Outcome: Not Progressing 10/02/2023 1634 by Derenda Flax, RN Outcome: Not Progressing Goal: Mental status will improve 10/02/2023 1739 by Derenda Flax, RN Outcome: Not Progressing 10/02/2023 1634 by Derenda Flax, RN Outcome: Not Progressing 10/02/2023 1634 by Derenda Flax, RN Outcome: Not Progressing Goal: Verbalization of understanding the information provided will improve 10/02/2023 1739 by Derenda Flax, RN Outcome: Not Progressing 10/02/2023 1634 by Derenda Flax, RN Outcome: Not Progressing 10/02/2023 1634 by Derenda Flax, RN Outcome: Not Progressing   Problem: Activity: Goal: Interest or engagement in activities will improve 10/02/2023 1739 by Derenda Flax, RN Outcome: Not Progressing 10/02/2023 1634 by Derenda Flax, RN Outcome: Not Progressing 10/02/2023 1634 by Derenda Flax, RN Outcome: Not Progressing Goal: Sleeping patterns will improve 10/02/2023 1739 by Derenda Flax, RN Outcome: Not Progressing 10/02/2023 1634 by Derenda Flax, RN Outcome: Not Progressing 10/02/2023 1634 by Derenda Flax, RN Outcome: Not Progressing   Problem: Coping: Goal: Ability to verbalize frustrations and anger appropriately will improve 10/02/2023 1739 by Derenda Flax, RN Outcome: Not Progressing 10/02/2023 1634 by Derenda Flax, RN Outcome: Not Progressing 10/02/2023 1634 by Derenda Flax, RN Outcome: Not Progressing Goal: Ability to demonstrate self-control will improve 10/02/2023 1739  by Derenda Flax, RN Outcome: Not Progressing 10/02/2023 1634 by Derenda Flax, RN Outcome: Not Progressing 10/02/2023 1634 by Derenda Flax, RN Outcome: Not Progressing   Problem: Health Behavior/Discharge Planning: Goal: Identification of resources available to assist in meeting health care needs will improve 10/02/2023 1739 by Derenda Flax, RN Outcome: Not Progressing 10/02/2023 1634 by Derenda Flax, RN Outcome: Not Progressing 10/02/2023 1634 by Derenda Flax, RN Outcome: Not Progressing Goal: Compliance with treatment plan for underlying cause of condition will improve 10/02/2023 1739 by Derenda Flax, RN Outcome: Not Progressing 10/02/2023 1634 by Derenda Flax, RN Outcome: Not Progressing 10/02/2023 1634 by Derenda Flax, RN Outcome: Not Progressing   Problem: Physical Regulation: Goal: Ability to maintain clinical measurements within normal limits will improve 10/02/2023 1739 by Derenda Flax, RN Outcome: Not Progressing 10/02/2023 1634 by Derenda Flax, RN Outcome: Not Progressing 10/02/2023 1634 by Derenda Flax, RN Outcome: Not Progressing   Problem: Safety: Goal: Periods of time without injury will increase 10/02/2023 1739 by Derenda Flax, RN Outcome: Not Progressing 10/02/2023 1634 by Derenda Flax, RN Outcome: Not Progressing 10/02/2023 1634 by Derenda Flax, RN Outcome: Not Progressing

## 2023-10-02 NOTE — Group Note (Signed)
 Date:  10/02/2023 Time:  9:06 PM  Group Topic/Focus:  Wrap-Up Group:   The focus of this group is to help patients review their daily goal of treatment and discuss progress on daily workbooks.    Participation Level:  Did Not Attend    Roger [name] 10/02/2023, 9:06 PM

## 2023-10-03 DIAGNOSIS — F29 Unspecified psychosis not due to a substance or known physiological condition: Secondary | ICD-10-CM

## 2023-10-03 MED ORDER — OLANZAPINE 5 MG PO TABS
15.0000 mg | ORAL_TABLET | Freq: Every day | ORAL | Status: DC
  Administered 2023-10-03 – 2023-10-05 (×3): 15 mg via ORAL
  Filled 2023-10-03 (×3): qty 1

## 2023-10-03 MED ORDER — TRAZODONE HCL 50 MG PO TABS
50.0000 mg | ORAL_TABLET | Freq: Every evening | ORAL | Status: DC | PRN
  Administered 2023-10-03 – 2023-10-25 (×20): 50 mg via ORAL
  Filled 2023-10-03 (×20): qty 1

## 2023-10-03 NOTE — BH IP Treatment Plan (Signed)
 Interdisciplinary Treatment and Diagnostic Plan Update  10/03/2023 Time of Session: 10:26 Roger [name] MRN: [id number]  Principal Diagnosis: Psychosis [facility])  Secondary Diagnoses: Principal Problem:   Psychosis (HCC)   Current Medications:  Current Facility-Administered Medications  Medication Dose Route Frequency Provider Last Rate Last Admin   acetaminophen  (TYLENOL ) tablet 650 mg  650 mg Oral Q6H PRN Roise Cleaver, NP       alum & mag hydroxide-simeth (MAALOX/MYLANTA) 200-200-20 MG/5ML suspension 30 mL  30 mL Oral Q4H PRN Roise Cleaver, NP       benztropine  (COGENTIN ) tablet 1 mg  1 mg Oral BID PRN Roise Cleaver, NP       Or   benztropine  mesylate (COGENTIN ) injection 1 mg  1 mg Intramuscular BID PRN Roise Cleaver, NP       hydrOXYzine  (ATARAX ) tablet 25 mg  25 mg Oral TID PRN Roise Cleaver, NP       magnesium  hydroxide (MILK OF MAGNESIA) suspension 30 mL  30 mL Oral Daily PRN Roise Cleaver, NP       OLANZapine  (ZYPREXA ) injection 10 mg  10 mg Intramuscular TID PRN Roise Cleaver, NP       OLANZapine  (ZYPREXA ) injection 5 mg  5 mg Intramuscular TID PRN Roise Cleaver, NP       OLANZapine  (ZYPREXA ) tablet 10 mg  10 mg Oral QHS Roise Cleaver, NP   10 mg at 10/02/23 2114   OLANZapine  zydis (ZYPREXA ) disintegrating tablet 5 mg  5 mg Oral TID PRN Roise Cleaver, NP       PTA Medications: No medications prior to admission.    Patient Stressors: Financial difficulties   Medication change or noncompliance   Substance abuse    Patient Strengths: Physical Health   Treatment Modalities: Medication Management, Group therapy, Case management,  1 to 1 session with clinician, Psychoeducation, Recreational therapy.   Physician Treatment Plan for Primary Diagnosis: Psychosis (HCC) Long Term Goal(s):     Short Term Goals:    Medication Management: Evaluate patient's response, side effects, and tolerance of medication regimen.  Therapeutic  Interventions: 1 to 1 sessions, Unit Group sessions and Medication administration.  Evaluation of Outcomes: Not Met  Physician Treatment Plan for Secondary Diagnosis: Principal Problem:   Psychosis (HCC)  Long Term Goal(s):     Short Term Goals:       Medication Management: Evaluate patient's response, side effects, and tolerance of medication regimen.  Therapeutic Interventions: 1 to 1 sessions, Unit Group sessions and Medication administration.  Evaluation of Outcomes: Not Met   RN Treatment Plan for Primary Diagnosis: Psychosis (HCC) Long Term Goal(s): Knowledge of disease and therapeutic regimen to maintain health will improve  Short Term Goals: Ability to remain free from injury will improve, Ability to verbalize frustration and anger appropriately will improve, Ability to demonstrate self-control, Ability to participate in decision making will improve, Ability to verbalize feelings will improve, Ability to disclose and discuss suicidal ideas, Ability to identify and develop effective coping behaviors will improve, and Compliance with prescribed medications will improve  Medication Management: RN will administer medications as ordered by provider, will assess and evaluate patient's response and provide education to patient for prescribed medication. RN will report any adverse and/or side effects to prescribing provider.  Therapeutic Interventions: 1 on 1 counseling sessions, Psychoeducation, Medication administration, Evaluate responses to treatment, Monitor vital signs and CBGs as ordered, Perform/monitor CIWA, COWS, AIMS and Fall Risk screenings as ordered, Perform wound care treatments as ordered.  Evaluation of Outcomes:  Not Met   LCSW Treatment Plan for Primary Diagnosis: Psychosis (HCC) Long Term Goal(s): Safe transition to appropriate next level of care at discharge, Engage patient in therapeutic group addressing interpersonal concerns.  Short Term Goals: Engage patient  in aftercare planning with referrals and resources, Increase social support, Increase ability to appropriately verbalize feelings, Increase emotional regulation, Facilitate acceptance of mental health diagnosis and concerns, Identify triggers associated with mental health/substance abuse issues, and Increase skills for wellness and recovery  Therapeutic Interventions: Assess for all discharge needs, 1 to 1 time with Social worker, Explore available resources and support systems, Assess for adequacy in community support network, Educate family and significant other(s) on suicide prevention, Complete Psychosocial Assessment, Interpersonal group therapy.  Evaluation of Outcomes: Not Met   Progress in Treatment: Attending groups: No. Participating in groups: No. Taking medication as prescribed: Yes. Toleration medication: Yes. Family/Significant other contact made: No, will contact:  if given permission.  Patient understands diagnosis: No. Discussing patient identified problems/goals with staff: No. Medical problems stabilized or resolved: Yes. Denies suicidal/homicidal ideation: Yes. Issues/concerns per patient self-inventory: No. Other: none.  New problem(s) identified: No, Describe:  none identified.   New Short Term/Long Term Goal(s): elimination of symptoms of psychosis, medication management for mood stabilization; elimination of SI thoughts; development of comprehensive mental wellness/sobriety plan.   Patient Goals:  Pt denied any goals for treatment.   Discharge Plan or Barriers: CSW will assist pt with development of an appropriate aftercare/discharge plan.   Reason for Continuation of Hospitalization: Delusions  Hallucinations Medication stabilization  Estimated Length of Stay: 1-7 days  Last 3 [place] Suicide Severity Risk Score: Flowsheet Row Admission (Current) from 10/02/2023 in [facility] INPATIENT BEHAVIORAL MEDICINE ED from 10/01/2023 in [facility] Emergency Department at  [facility] ED from 01/18/2023 in [facility] Emergency Department at [facility]  C-SSRS RISK CATEGORY No Risk No Risk No Risk    Last PHQ 2/9 Scores:     No data to display          Scribe for Treatment Team: Randolm Butte, LCSW 10/03/2023 10:50 AM

## 2023-10-03 NOTE — H&P (Signed)
 Psychiatric Admission Assessment Adult  Patient Identification: Roger [name] MRN:  [id number] Date of Evaluation:  10/03/2023 Chief Complaint:  Psychosis (HCC) [F29]   History of Present Illness: The patient is a 33 year old man with chart history of bipolar vs schizoaffective disorder; hx of polysubstance abuse, depression; he presented to ED after, reportedly, his grandmother contacted crisis services due to reported concerns re: patient responding to internal stimuli, not bathing, trespassing on church property, and digging a large hole in the backyard last week with unclear intent. Grandmother has expressed concerns with patient being discharged and, per chart, she is pursuing legal guardianship for patient via APS with hope for long-term placement. Patient was admitted to [facility] unit for psychosis . On exam today patient is disengaged and internally preoccupied.  He requires frequent redirection and is easily redirectable.  He is alert and oriented to location and self.  Indicates he does not know why he is in the hospital when questioned specifically about taking the whole he indicates he wanted to take a swim and hide from the world.  He denies SI HI and AVH however he is observed responding to internal stimuli and has discussion with self during the interview.  He is a poor historian and is unable to meaningfully fully participate in the interview given his internal preoccupations.  He denied substance use UDS was positive for THC.  He denied previous mental health admissions however they are found on chart review.  He endorses some paranoia but does not elaborate.  Per chart review there is a history of prior suicide attempts.  Given current psychosis patient requires ongoing inpatient psychiatric hospitalization for medication management and stabilization due to impaired judgment, danger to self, and danger to others.  Total Time spent with patient: 45 minutes Sleep  Sleep:Sleep:  Poor  Past Psychiatric History:  Psychiatric History:  Information collected from pt and chart review  Prev Dx/Sx: bipolar vs schizoaffective disorder Current Psych Provider: none Home Meds (current): none Previous Med Trials: invega , zyprexa , risperdal , invega  sustenna Therapy: none  Prior Psych Hospitalization: multiple ed visits and admissions 10/24 1/24 12/22 5/21 Prior Self Harm: Previous SI  Prior Violence: hx of agitation and aggression  Family Psych History: UTA Family Hx suicide: UTA  Social History:  Developmental Hx: none reported  Living Situation: with grandmother Spiritual Hx: unknown Access to weapons/lethal means: none reported   Substance History Alcohol: denies non etoh on arrival  Illicit drugs: prior substance use of amphetamines, cocaine, marijuana, benzos, MDMA, and hydrocodone currently only positive for marijuana. Rehab hx: none reported Is the patient at risk to self? Yes.    Has the patient been a risk to self in the past 6 months? Yes.    Has the patient been a risk to self within the distant past? Yes.    Is the patient a risk to others? Yes.    Has the patient been a risk to others in the past 6 months? Yes.    Has the patient been a risk to others within the distant past? Yes.     [place] Scale:  Flowsheet Row Admission (Current) from 10/02/2023 in [facility] INPATIENT [facility]AL MEDICINE ED from 10/01/2023 in [facility] Emergency Department at [facility] ED from 01/18/2023 in [facility] Emergency Department at [facility]  C-SSRS RISK CATEGORY No Risk No Risk No Risk     Past Medical History:  Past Medical History:  Diagnosis Date   Depression    Headache  Substance abuse (HCC)     Past Surgical History:  Procedure Laterality Date   FEMUR FRACTURE SURGERY     Family History:  Family History  Problem Relation Age of Onset   Alcohol abuse Father     Social History:  Social History   Substance and Sexual Activity   Alcohol Use Yes   Alcohol/week: 1.0 standard drink of alcohol   Types: 1 Shots of liquor per week     Social History   Substance and Sexual Activity  Drug Use Yes   Types: Amphetamines, Cocaine, Marijuana, Benzodiazepines, MDMA (Ecstacy), Hydrocodone   Comment: only weed currently      Allergies:   Allergies  Allergen Reactions   Haldol  [Haloperidol  Lactate]     EPS sxs   Lab Results:  Results for orders placed or performed during the hospital encounter of 10/01/23 (from the past 48 hours)  Comprehensive metabolic panel     Status: Abnormal   Collection Time: 10/01/23  5:59 PM  Result Value Ref Range   Sodium 140 135 - 145 mmol/L   Potassium 3.7 3.5 - 5.1 mmol/L   Chloride 106 98 - 111 mmol/L   CO2 27 22 - 32 mmol/L   Glucose, Bld 110 (H) 70 - 99 mg/dL    Comment: Glucose reference range applies only to samples taken after fasting for at least 8 hours.   BUN 16 6 - 20 mg/dL   Creatinine, Ser 2.95 0.61 - 1.24 mg/dL   Calcium 9.5 8.9 - 62.1 mg/dL   Total Protein 7.6 6.5 - 8.1 g/dL   Albumin 4.3 3.5 - 5.0 g/dL   AST 18 15 - 41 U/L   ALT 11 0 - 44 U/L   Alkaline Phosphatase 63 38 - 126 U/L   Total Bilirubin 1.3 (H) 0.0 - 1.2 mg/dL   GFR, Estimated >30 >86 mL/min    Comment: (NOTE) Calculated using the CKD-EPI Creatinine Equation (2021)    Anion gap 7 5 - 15    Comment: Performed at [facility], [address] [place], [place] [address]  Ethanol     Status: None   Collection Time: 10/01/23  5:59 PM  Result Value Ref Range   Alcohol, Ethyl (B) <15 <15 mg/dL    Comment: (NOTE) For medical purposes only. Performed at [facility], [address] [place], [place] [address]   cbc     Status: None   Collection Time: 10/01/23  5:59 PM  Result Value Ref Range   WBC 7.9 4.0 - 10.5 K/uL   RBC 5.14 4.22 - 5.81 MIL/uL   Hemoglobin 15.5 13.0 - 17.0 g/dL   HCT 28.4 13.2 - 44.0 %   MCV 88.7 80.0 - 100.0 fL   MCH 30.2 26.0 - 34.0 pg   MCHC 34.0 30.0  - 36.0 g/dL   RDW 10.2 72.5 - 36.6 %   Platelets 268 150 - 400 K/uL   nRBC 0.0 0.0 - 0.2 %    Comment: Performed at [facility], [address], [place], [place] [address]  Urine Drug Screen, Qualitative     Status: Abnormal   Collection Time: 10/01/23  5:59 PM  Result Value Ref Range   Tricyclic, Ur Screen NONE DETECTED NONE DETECTED   Amphetamines, Ur Screen NONE DETECTED NONE DETECTED   MDMA (Ecstasy)Ur Screen NONE DETECTED NONE DETECTED   Cocaine Metabolite,Ur [place] NONE DETECTED NONE DETECTED   Opiate, Ur Screen NONE DETECTED NONE DETECTED   Phencyclidine (PCP)  Ur S NONE DETECTED NONE DETECTED   Cannabinoid 50 Ng, Ur Will POSITIVE (A) NONE DETECTED   Barbiturates, Ur Screen NONE DETECTED NONE DETECTED   Benzodiazepine, Ur Scrn NONE DETECTED NONE DETECTED   Methadone Scn, Ur NONE DETECTED NONE DETECTED    Comment: (NOTE) Tricyclics + metabolites, urine    Cutoff 1000 ng/mL Amphetamines + metabolites, urine  Cutoff 1000 ng/mL MDMA (Ecstasy), urine              Cutoff 500 ng/mL Cocaine Metabolite, urine          Cutoff 300 ng/mL Opiate + metabolites, urine        Cutoff 300 ng/mL Phencyclidine (PCP), urine         Cutoff 25 ng/mL Cannabinoid, urine                 Cutoff 50 ng/mL Barbiturates + metabolites, urine  Cutoff 200 ng/mL Benzodiazepine, urine              Cutoff 200 ng/mL Methadone, urine                   Cutoff 300 ng/mL  The urine drug screen provides only a preliminary, unconfirmed analytical test result and should not be used for non-medical purposes. Clinical consideration and professional judgment should be applied to any positive drug screen result due to possible interfering substances. A more specific alternate chemical method must be used in order to obtain a confirmed analytical result. Gas chromatography / mass spectrometry (GC/MS) is the preferred confirm atory method. Performed at [facility], [address] [place], [place]  [address]     Blood Alcohol level:  Lab Results  Component Value Date   [facility] <15 10/01/2023   ETH <10 01/18/2023    Metabolic Disorder Labs:  Lab Results  Component Value Date   HGBA1C 5.9 (H) 04/05/2021   MPG 123 04/05/2021   MPG 119.76 05/12/2020   Lab Results  Component Value Date   PROLACTIN 25.5 (H) 12/25/2015   Lab Results  Component Value Date   CHOL 159 04/05/2021   TRIG 70 04/05/2021   HDL 59 04/05/2021   CHOLHDL 2.7 04/05/2021   VLDL 14 04/05/2021   LDLCALC 86 04/05/2021   LDLCALC 81 05/12/2020    Current Medications: Current Facility-Administered Medications  Medication Dose Route Frequency Provider Last Rate Last Admin   acetaminophen  (TYLENOL ) tablet 650 mg  650 mg Oral Q6H PRN Roise Cleaver, NP       alum & mag hydroxide-simeth (MAALOX/MYLANTA) 200-200-20 MG/5ML suspension 30 mL  30 mL Oral Q4H PRN Roise Cleaver, NP       benztropine  (COGENTIN ) tablet 1 mg  1 mg Oral BID PRN Roise Cleaver, NP       Or   benztropine  mesylate (COGENTIN ) injection 1 mg  1 mg Intramuscular BID PRN Roise Cleaver, NP       hydrOXYzine  (ATARAX ) tablet 25 mg  25 mg Oral TID PRN Roise Cleaver, NP   25 mg at 10/03/23 1146   magnesium  hydroxide (MILK OF MAGNESIA) suspension 30 mL  30 mL Oral Daily PRN Roise Cleaver, NP       OLANZapine  (ZYPREXA ) injection 10 mg  10 mg Intramuscular TID PRN Roise Cleaver, NP       OLANZapine  (ZYPREXA ) injection 5 mg  5 mg Intramuscular TID PRN Roise Cleaver, NP       OLANZapine  (ZYPREXA ) tablet 15 mg  15 mg Oral QHS Lotus Gover E, PA-C  OLANZapine  zydis (ZYPREXA ) disintegrating tablet 5 mg  5 mg Oral TID PRN Roise Cleaver, NP       traZODone  (DESYREL ) tablet 50 mg  50 mg Oral QHS PRN Nelida Mandarino E, PA-C       PTA Medications: No medications prior to admission.    Psychiatric Specialty Exam:  Presentation  General Appearance:  Disheveled  Eye Contact: Fleeting  Speech: Normal Rate  Speech  Volume: Normal    Mood and Affect  Mood: -- (neutral)  Affect: Blunt   Thought Process  Thought Processes: Disorganized  Descriptions of Associations:Loose  Orientation:Partial  Thought Content:Illogical  Hallucinations:Hallucinations: Auditory  Ideas of Reference:-- (UTA)  Suicidal Thoughts:Suicidal Thoughts: No  Homicidal Thoughts:Homicidal Thoughts: No   Sensorium  Memory: Immediate Poor; Recent Poor; Remote Poor  Judgment: Impaired  Insight: Lacking   Executive Functions  Concentration: Poor  Attention Span: Poor  Recall: [employer] of Knowledge: Fair  Language: Fair   Psychomotor Activity  Psychomotor Activity:Psychomotor Activity: Restlessness   Assets  Assets: Social Support    Musculoskeletal: Strength & Muscle Tone: within normal limits Gait & Station: normal  Physical Exam: Physical Exam Vitals and nursing note reviewed.  Constitutional:      Comments: Malodorous   HENT:     Head: Atraumatic.   Eyes:     Extraocular Movements: Extraocular movements intact.   Pulmonary:     Effort: Pulmonary effort is normal.   Neurological:     Mental Status: He is alert.   Psychiatric:        Attention and Perception: He is inattentive. He perceives auditory hallucinations.        Behavior: Behavior is actively hallucinating.        Judgment: Judgment is impulsive.    Review of Systems  Psychiatric/Behavioral:  Positive for hallucinations and substance abuse. Negative for suicidal ideas. The patient has insomnia.    Blood pressure (!) 127/96, pulse 68, temperature 98.6 F (37 C), temperature source Oral, resp. rate 16, height 5' 6 (1.676 m), weight 56.2 kg, SpO2 100%. Body mass index is 20.01 kg/m.  Principal Diagnosis: Psychosis (HCC) Diagnosis:  Principal Problem:   Psychosis (HCC)   Clinical Decision Making:  Treatment Plan Summary:  Safety and Monitoring:             -- Voluntary admission to inpatient  psychiatric unit for safety, stabilization and treatment             -- Daily contact with patient to assess and evaluate symptoms and progress in treatment             -- Patient's case to be discussed in multi-disciplinary team meeting             -- Observation Level: q15 minute checks             -- Vital signs:  q12 hours             -- Precautions: suicide, elopement, and assault   2. Psychiatric Diagnoses and Treatment:              Schizoaffective vs bipolar disorder.  - pt with acute psychosis with hallucinations, disorganization, and self care deficit. Pt unable to fully participate in exam given internal preoccupation. Patient continues to require inpatient psychiatric hospitalization due to severely impaired judgement, danger to self, and others.   - increase zyprexa  to 15 mg nightly     -- The risks/benefits/side-effects/alternatives to this medication were discussed in detail  with the patient and time was given for questions. The patient consents to medication trial.                -- Metabolic profile and EKG monitoring obtained while on an atypical antipsychotic (BMI: Lipid Panel: HbgA1c: QTc:394)             -- Encouraged patient to participate in unit milieu and in scheduled group therapies                            3. Medical Issues Being Addressed:   no acute concerns   4. Discharge Planning:              -- Social work and case management to assist with discharge planning and identification of hospital follow-up needs prior to discharge             -- Estimated LOS: 5-7 days             -- Discharge Concerns: Need to establish a safety plan; Medication compliance and effectiveness             -- Discharge Goals: Return home with outpatient referrals follow ups  Physician Treatment Plan for Primary Diagnosis: Psychosis (HCC) Long Term Goal(s): Improvement in symptoms so as ready for discharge  Short Term Goals: Ability to identify changes in lifestyle to reduce  recurrence of condition will improve, Ability to verbalize feelings will improve, Ability to maintain clinical measurements within normal limits will improve, and Compliance with prescribed medications will improve   I certify that inpatient services furnished can reasonably be expected to improve the patient's condition.    Fay Hoop, PA-C 6/18/20255:37 PM

## 2023-10-03 NOTE — Group Note (Signed)
 Date:  10/03/2023 Time:  10:03 PM  Group Topic/Focus:  Wellness Toolbox:   The focus of this group is to discuss various aspects of wellness, balancing those aspects and exploring ways to increase the ability to experience wellness.  Patients will create a wellness toolbox for use upon discharge.    Participation Level:  Minimal  Participation Quality:  Appropriate  Affect:  Appropriate  Cognitive:  Appropriate  Insight: Appropriate and Limited  Engagement in Group:  Limited  Modes of Intervention:  Discussion  Additional Comments:     Fabiola Holy 10/03/2023, 10:03 PM

## 2023-10-03 NOTE — Group Note (Signed)
 Date:  10/03/2023 Time:  5:36 PM  Group Topic/Focus:  Wellness Toolbox:   The focus of this group is to discuss various aspects of wellness, balancing those aspects and exploring ways to increase the ability to experience wellness.  Patients will create a wellness toolbox for use upon discharge.    Participation Level:  Minimal  Participation Quality:  Appropriate  Affect:  Appropriate  Cognitive:  Appropriate  Insight: Appropriate  Engagement in Group:  Engaged  Modes of Intervention:  Activity and Socialization  Additional Comments:    Laverne Potter 10/03/2023, 5:36 PM

## 2023-10-03 NOTE — Progress Notes (Signed)
   10/03/23 1200  Psych Admission Type (Psych Patients Only)  Admission Status Involuntary  Psychosocial Assessment  Patient Complaints Anxiety  Eye Contact Fair  Facial Expression Fixed smile;Animated (animated and smiling at times throughout the day)  Affect Anxious;Preoccupied  Speech Soft  Interaction Guarded;Minimal  Motor Activity Pacing;Restless;Slow (patient paces the unit throughout the day)  Appearance/Hygiene Body odor;Disheveled;Poor hygiene;In scrubs  Behavior Characteristics Pacing;Restless;Cooperative  Mood Preoccupied;Pleasant  Aggressive Behavior  Effect No apparent injury  Thought Process  Coherency Disorganized  Content Preoccupation  Delusions None reported or observed  Perception Hallucinations;Depersonalization (patient states I don't know, I might be tripping, and then started laughing and said nah, denying any hallucinations.)  Hallucination Auditory;Visual (patient observed responding to internal stimuli)  Judgment Limited  Confusion Mild  Danger to Self  Current suicidal ideation? Denies  Danger to Others  Danger to Others None reported or observed

## 2023-10-03 NOTE — Group Note (Signed)

## 2023-10-03 NOTE — Group Note (Signed)
 Date:  10/03/2023 Time:  2:42 PM  Group Topic/Focus:  Goals Group:   The focus of this group is to help patients establish daily goals to achieve during treatment and discuss how the patient can incorporate goal setting into their daily lives to aide in recovery.    Participation Level:  Did Not Attend   Mellie Sprinkle [facility] 10/03/2023, 2:42 PM

## 2023-10-03 NOTE — [facility] Counselor (Signed)
 CSW sat with pt for APS screening. He was appropriate during assessment. He answered many questions regarding the allegations against him with questions. Screening was completed. No other concerns expressed. Contact ended without incident.  Elvie Hammed. Johnnye Nancy, MSW, LCSW, LCAS 10/03/2023 4:18 PM

## 2023-10-03 NOTE — Progress Notes (Signed)
   10/02/23 2000  Psych Admission Type (Psych Patients Only)  Admission Status Involuntary  Psychosocial Assessment  Patient Complaints Anxiety  Eye Contact Brief  Facial Expression Blank  Affect Apprehensive  Speech Soft;Incoherent  Interaction Childlike  Motor Activity Restless  Appearance/Hygiene Body odor;Disheveled  Behavior Characteristics Cooperative;Elopement risk  Mood Apprehensive;Suspicious  Aggressive Behavior  Effect No apparent injury  Thought Process  Coherency Concrete thinking;Incoherent  Content Blaming others  Delusions Paranoid  Perception Hallucinations  Hallucination Auditory  Judgment Poor  Confusion Mild  Danger to Self  Current suicidal ideation? Denies  Danger to Others  Danger to Others None reported or observed

## 2023-10-03 NOTE — Plan of Care (Signed)

## 2023-10-03 NOTE — Plan of Care (Signed)
   Problem: Education: Goal: Knowledge of [facility] Education information/materials will improve Outcome: Progressing Goal: Emotional status will improve Outcome: Progressing Goal: Mental status will improve Outcome: Progressing

## 2023-10-04 DIAGNOSIS — F29 Unspecified psychosis not due to a substance or known physiological condition: Secondary | ICD-10-CM | POA: Diagnosis not present

## 2023-10-04 NOTE — Group Note (Signed)
 Date:  10/04/2023 Time:  10:55 PM  Group Topic/Focus:  Personal Choices and Values:   The focus of this group is to help patients assess and explore the importance of values in their lives, how their values affect their decisions, how they express their values and what opposes their expression.    Participation Level:  Did Not Attend  Participation Quality:  none  Affect:  none  Cognitive:  none  Insight: None  Engagement in Group:  none  Modes of Intervention:  none  Additional Comments:  none   Jennifer Payes 10/04/2023, 10:55 PM

## 2023-10-04 NOTE — Progress Notes (Signed)
 Pt seen briefly in milieu and interacting with selective peers. Guarded and preoccupied in thoughts, denied depression, SI/HI; endorsed anxiety rated mild, and regarding AVH, Pt states "I'm ok, not really."  Pt reported not feeling safe in here (the hospital).  Pt to scheduled HS medications.     10/03/23 2100  Psych Admission Type (Psych Patients Only)  Admission Status Involuntary  Psychosocial Assessment  Patient Complaints Anxiety  Eye Contact Fair  Facial Expression Fixed smile  Affect Anxious;Preoccupied  Speech Soft  Interaction Guarded;Minimal  Motor Activity Slow;Pacing  Appearance/Hygiene Disheveled;Poor hygiene  Behavior Characteristics Cooperative  Mood Anxious;Preoccupied;Pleasant  Aggressive Behavior  Effect No apparent injury  Thought Process  Coherency Disorganized  Content Preoccupation  Delusions None reported or observed  Perception UTA  Hallucination None reported or observed  Judgment Limited  Confusion None  Danger to Self  Current suicidal ideation? Denies  Danger to Others  Danger to Others None reported or observed    Problem: Education: Goal: Knowledge of [facility] Education information/materials will improve Outcome: Progressing Goal: Emotional status will improve Outcome: Progressing Goal: Mental status will improve Outcome: Progressing Goal: Verbalization of understanding the information provided will improve Outcome: Progressing   Problem: Activity: Goal: Interest or engagement in activities will improve Outcome: Progressing Goal: Sleeping patterns will improve Outcome: Progressing   Problem: Coping: Goal: Ability to verbalize frustrations and anger appropriately will improve Outcome: Progressing Goal: Ability to demonstrate self-control will improve Outcome: Progressing

## 2023-10-04 NOTE — Progress Notes (Signed)
   10/04/23 2000  Psych Admission Type (Psych Patients Only)  Admission Status Involuntary  Psychosocial Assessment  Patient Complaints None  Eye Contact Brief  Facial Expression Flat  Affect Preoccupied  Speech Soft  Interaction Minimal  Motor Activity Slow;Pacing  Appearance/Hygiene Unremarkable  Behavior Characteristics Cooperative;Appropriate to situation  Mood Preoccupied  Aggressive Behavior  Effect No apparent injury  Thought Process  Coherency Disorganized  Content Preoccupation  Delusions None reported or observed  Perception UTA  Hallucination None reported or observed  Judgment Limited  Confusion None  Danger to Self  Current suicidal ideation? Denies  Danger to Others  Danger to Others None reported or observed

## 2023-10-04 NOTE — Group Note (Signed)
 Recreation Therapy Group Note   Group Topic:Coping Skills  Group Date: 10/04/2023 Start Time: 1530 End Time: 1615 Facilitators: Yvonna Herder, CTRS Location: Craft Room  Group Description: Coping A-Z. LRT and patients engage in a guided discussion on what coping skills are and gave specific examples. LRT passed out a handout labeled Coping A-Z with blank spaces beside each letter. LRT prompted patients to come up with a coping skill for each of the letters. LRT and patients went over the handout and gave ideas for each letter if anyone had any blanks left on their paper. Patients kept this handout with them that listed 26 different coping skills.   Goal Area(s) Addressed: Patients will be able to define "coping skills". Patient will identify new coping skills.  Patient will increase communication.   Affect/Mood: N/A   Participation Level: Did not attend    Clinical Observations/Individualized Feedback: Patient did not attend group.   Plan: Continue to engage patient in RT group sessions 2-3x/week.   Deatrice Factor, LRT, CTRS 10/04/2023 5:31 PM

## 2023-10-04 NOTE — Plan of Care (Signed)
   Problem: Education: Goal: Emotional status will improve Outcome: Progressing Goal: Mental status will improve Outcome: Progressing   Problem: Safety: Goal: Periods of time without injury will increase Outcome: Progressing

## 2023-10-04 NOTE — [facility] Suicide Risk Assessment (Signed)
 [facility] INPATIENT:  Family/Significant Other Suicide Prevention Education  Suicide Prevention Education:  Patient Refusal for Family/Significant Other Suicide Prevention Education: The patient Roger [name] has refused to provide written consent for family/significant other to be provided Family/Significant Other Suicide Prevention Education during admission and/or prior to discharge.  Physician notified.  SPE completed with pt, as pt refused to consent to family contact. SPI pamphlet provided to pt and pt was encouraged to share information with support network, ask questions, and talk about any concerns relating to SPE. Pt denies access to guns/firearms and verbalized understanding of information provided. Mobile Crisis information also provided to pt.  Randolm Butte 10/04/2023, 3:49 PM

## 2023-10-04 NOTE — Progress Notes (Signed)
   10/04/23 0900  Psych Admission Type (Psych Patients Only)  Admission Status Involuntary  Psychosocial Assessment  Patient Complaints Anxiety  Eye Contact Fair  Facial Expression Flat  Affect Anxious;Preoccupied  Speech Soft  Interaction Guarded;Minimal  Motor Activity Slow;Pacing  Appearance/Hygiene Poor hygiene;Disheveled  Behavior Characteristics Cooperative  Mood Anxious;Preoccupied;Pleasant  Aggressive Behavior  Effect No apparent injury  Thought Process  Coherency Disorganized  Content Preoccupation  Delusions None reported or observed  Perception UTA  Hallucination None reported or observed  Judgment Limited  Confusion None  Danger to Self  Current suicidal ideation? Denies  Danger to Others  Danger to Others None reported or observed

## 2023-10-04 NOTE — Group Note (Signed)
 Date:  10/04/2023 Time:  6:07 PM  Group Topic/Focus:  Goals Group:   The focus of this group is to help patients establish daily goals to achieve during treatment and discuss how the patient can incorporate goal setting into their daily lives to aide in recovery.  Participation Level:  Did Not Attend   Roger [name] A Riot Barrick 10/04/2023, 6:07 PM

## 2023-10-04 NOTE — Plan of Care (Signed)

## 2023-10-04 NOTE — [facility] Suicide Risk Assessment (Signed)
 [facility] Admission Suicide Risk Assessment   Nursing information obtained from:  Patient, Other (Comment) Printmaker per report) Demographic factors:  Male, Low socioeconomic status, Unemployed Current Mental Status:  NA Loss Factors:  NA Historical Factors:  Prior suicide attempts, Impulsivity Risk Reduction Factors:  NA  Total Time spent with patient: 45 minutes Principal Problem: Psychosis (HCC) Diagnosis:  Principal Problem:   Psychosis (HCC)  Subjective Data: The patient is a 33 year old man with chart history of bipolar vs schizoaffective disorder; hx of polysubstance abuse, depression; he presented to ED after, reportedly, his grandmother contacted crisis services due to reported concerns re: patient responding to internal stimuli, not bathing, trespassing on church property, and digging a large hole in the backyard last week with unclear intent. Grandmother has expressed concerns with patient being discharged and, per chart, she is pursuing legal guardianship for patient via APS with hope for long-term placement. Patient was admitted to [facility] unit for psychosis . On exam today patient is disengaged and internally preoccupied.  He requires frequent redirection and is easily redirectable.  He is alert and oriented to location and self.  Indicates he does not know why he is in the hospital when questioned specifically about taking the whole he indicates he wanted to take a swim and hide from the world.  He denies SI HI and AVH however he is observed responding to internal stimuli and has discussion with self during the interview.  He is a poor historian and is unable to meaningfully fully participate in the interview given his internal preoccupations.  He denied substance use UDS was positive for THC.  He denied previous mental health admissions however they are found on chart review.  He endorses some paranoia but does not elaborate.  Per chart review there is a history of prior suicide attempts.  Given  current psychosis patient requires ongoing inpatient psychiatric hospitalization for medication management and stabilization due to impaired judgment, danger to self, and danger to others.   Continued Clinical Symptoms:  Alcohol Use Disorder Identification Test Final Score (AUDIT): 0 The Alcohol Use Disorders Identification Test, Guidelines for Use in Primary Care, Second Edition.  World Science writer [facility]). Score between 0-7:  no or low risk or alcohol related problems. Score between 8-15:  moderate risk of alcohol related problems. Score between 16-19:  high risk of alcohol related problems. Score 20 or above:  warrants further diagnostic evaluation for alcohol dependence and treatment.   CLINICAL FACTORS:   Currently Psychotic   Musculoskeletal: Strength & Muscle Tone: within normal limits Gait & Station: normal Patient leans: N/A  Psychiatric Specialty Exam:  Presentation  General Appearance:  Disheveled  Eye Contact: Fleeting  Speech: Normal Rate  Speech Volume: Normal  Handedness: Right   Mood and Affect  Mood: Euthymic  Affect: Blunt   Thought Process  Thought Processes: -- (More linear today, still with disorganization)  Descriptions of Associations:Loose  Orientation:Partial  Thought Content:Illogical; Paranoid Ideation  History of Schizophrenia/Schizoaffective disorder:Yes  Duration of Psychotic Symptoms:Greater than six months  Hallucinations:Hallucinations: Auditory Description of Auditory Hallucinations: denies command, is guarded to nature of hallucinations  Ideas of Reference:None  Suicidal Thoughts:Suicidal Thoughts: No  Homicidal Thoughts:Homicidal Thoughts: No   Sensorium  Memory: Immediate Poor; Recent Poor  Judgment: Impaired  Insight: Lacking   Executive Functions  Concentration: Fair  Attention Span: Poor  Recall: [employer] of Knowledge: Fair  Language: Fair   Psychomotor Activity   Psychomotor Activity: Psychomotor Activity: Restlessness   Assets  Assets: Social Support   Sleep  Sleep: Sleep: Fair    Physical Exam: Physical Exam Vitals and nursing note reviewed.  HENT:     Head: Atraumatic.   Eyes:     Extraocular Movements: Extraocular movements intact.   Pulmonary:     Effort: Pulmonary effort is normal.   Neurological:     Mental Status: He is alert and oriented to person, place, and time.    Review of Systems  Psychiatric/Behavioral:  Positive for hallucinations. Negative for depression, substance abuse and suicidal ideas. The patient is nervous/anxious.    Blood pressure 128/87, pulse 79, temperature (!) 97.5 F (36.4 C), resp. rate 18, height 5' 6 (1.676 m), weight 56.2 kg, SpO2 100%. Body mass index is 20.01 kg/m.   COGNITIVE FEATURES THAT CONTRIBUTE TO RISK:  Loss of executive function    SUICIDE RISK:   Minimal: No identifiable suicidal ideation.  Patients presenting with no risk factors but with morbid ruminations; may be classified as minimal risk based on the severity of the depressive symptoms  PLAN OF CARE:  Safety and Monitoring:   --  Involuntary admission to inpatient psychiatric unit for safety, stabilization and treatment -- Daily contact with patient to assess and evaluate symptoms and progress in treatment -- Patient's case to be discussed in multi-disciplinary team meeting -- Observation Level : q15 minute checks -- Vital signs:  q12 hours -- Precautions: suicide   I certify that inpatient services furnished can reasonably be expected to improve the patient's condition.   Fay Hoop, PA-C 10/04/2023, 8:20 PM

## 2023-10-04 NOTE — Group Note (Signed)
 [facility] LCSW Group Therapy Note   Group Date: 10/04/2023 Start Time: 1310 End Time: 1400   Type of Therapy/Topic:  Group Therapy:  Balance in Life  Participation Level:  Did Not Attend   Description of Group:    This group will address the concept of balance and how it feels and looks when one is unbalanced. Patients will be encouraged to process areas in their lives that are out of balance, and identify reasons for remaining unbalanced. Facilitators will guide patients utilizing problem- solving interventions to address and correct the stressor making their life unbalanced. Understanding and applying boundaries will be explored and addressed for obtaining  and maintaining a balanced life. Patients will be encouraged to explore ways to assertively make their unbalanced needs known to significant others in their lives, using other group members and facilitator for support and feedback.  Therapeutic Goals: Patient will identify two or more emotions or situations they have that consume much of in their lives. Patient will identify signs/triggers that life has become out of balance:  Patient will identify two ways to set boundaries in order to achieve balance in their lives:  Patient will demonstrate ability to communicate their needs through discussion and/or role plays  Summary of Patient Progress: Patient declined to attend group.    Therapeutic Modalities:   Cognitive Behavioral Therapy Solution-Focused Therapy Assertiveness Training   Larri Ply, LCSW

## 2023-10-04 NOTE — [facility] Counselor (Signed)
 Adult Comprehensive Assessment  Patient ID: Roger [name], male   DOB: 30-Aug-1990, 33 y.o.   MRN: [id number]  Information Source: Information source: Patient (Previous PSA from encounter 04/01/2021.)  Current Stressors:  Patient states their primary concerns and needs for treatment are:: I don't know. Patient states their goals for this hospitilization and ongoing recovery are:: Pt denied any goals during treatment team and continues to deny any. Educational / Learning stressors: None reported Employment / Job issues: Pt is unemployed. Family Relationships: He denied any support system. Financial / Lack of resources (include bankruptcy): Pt is unemployed. Housing / Lack of housing: Housing is questionable. Physical health (include injuries & life threatening diseases): None reported Social relationships: Pt denied any support system. Substance abuse: UDS positive for cannabanoids. Bereavement / Loss: Unable to assess.  Living/Environment/Situation:  Living Arrangements: Alone Living conditions (as described by patient or guardian): Beside the road, in a tree. Per chart review pt was living with his grandmother. Who else lives in the home?: Unable to assess How long has patient lived in current situation?: I don't know. Previously noted to have lived with grandmother his whole life. What is atmosphere in current home: Other (Comment) (Unable to assess)  Family History:  Marital status: Single Are you sexually active?:  (Unable to assess) What is your sexual orientation?: Heterosexual Has your sexual activity been affected by drugs, alcohol, medication, or emotional stress?: Unable to assess Does patient have children?: No  Childhood History:  By whom was/is the patient raised?: Other (Comment) (Previously noted to have been raised by his grandmother.) Additional childhood history information: I wish I could remember. Think I was raised in the wild. Description of  patient's relationship with caregiver when they were a child: Unable to assess Patient's description of current relationship with people who raised him/her: Unable to assess How were you disciplined when you got in trouble as a child/adolescent?: Unable to assess. Does patient have siblings?: Yes (Unable to assess) Number of Siblings: 4 (Sisters per previous assessment.) Description of patient's current relationship with siblings: Unable to assess. Did patient suffer any verbal/emotional/physical/sexual abuse as a child?:  (Unable to assess.) Did patient suffer from severe childhood neglect?:  (Unable to assess.) Has patient ever been sexually abused/assaulted/raped as an adolescent or adult?:  (Unable to assess) Was the patient ever a victim of a crime or a disaster?:  (Unable to assess) Witnessed domestic violence?:  (Unable to assess) Has patient been affected by domestic violence as an adult?:  (Unable to assess)  Education:  Highest grade of school patient has completed: I don't even think I went to school. Currently a student?:  (Unable to assess) Learning disability?:  (Unable to assess)  Employment/Work Situation:   Employment Situation: Unemployed What is the Longest Time Patient has Held a Job?: 2 years per previous assessment. Where was the Patient Employed at that Time?: [employer] pre previous assessment. Has Patient ever Been in the [employer]?: No  Financial Resources:   Financial resources: No income Does patient have a Lawyer or guardian?: No  Alcohol/Substance Abuse:   What has been your use of drugs/alcohol within the last 12 months?: Patient denied any use of substances. UDS positive for cannabanoids. If attempted suicide, did drugs/alcohol play a role in this?:  (Unable to assess.) Alcohol/Substance Abuse Treatment Hx:  (Unable to assess.) Has alcohol/substance abuse ever caused legal problems?:  (Previously noted to have gotten a DUI at 33  yo.)  Social Support System:  Patient's Community Support System: None Describe Community Support System: Pt denied any support system. Type of faith/religion: Unable to assess. How does patient's faith help to cope with current illness?: Unable to assess  Leisure/Recreation:      Strengths/Needs:   What is the patient's perception of their strengths?: Unable to assess. Patient states they can use these personal strengths during their treatment to contribute to their recovery: Unable to assess. Patient states these barriers may affect/interfere with their treatment: Unable to assess. Patient states these barriers may affect their return to the community: Unable to assess. Other important information patient would like considered in planning for their treatment: Unable to assess.  Discharge Plan:   Currently receiving community mental health services: No Patient states concerns and preferences for aftercare planning are: Pt denied interest in having any mental health follow up upon discharge. Patient states they will know when they are safe and ready for discharge when: Unable to assess. Does patient have access to transportation?: No Patient description of barriers related to discharge medications: Unable to assess. Plan for no access to transportation at discharge: Unable to assess. Will patient be returning to same living situation after discharge?:  (Unable to assess.)  Summary/Recommendations:   Summary and Recommendations (to be completed by the evaluator): Patient is a 33 year old, single, male from [place], [place] [facility] [place]). He reported that he does not know why he was brought to the hospital. Per chart review pt was brought in after his grandmother called crisis services due to concerns of pt responding to internal stimuli, not bathing, trespassing on church property, and digging a large hole in the backyard last week with unclear intent. Pt denied any goals during his  treatment team meeting and continued during interaction for assessment. Pt is a poor historian and was not forth coming with information. Most of pt's responses were that he did not know. Pt presented this way during treatment team, contact with APS, and during interaction for completion of assessment. Pt denied any current outpatient mental health providers or interest in being connected with any after discharge. Recommendations include crisis stabilization, therapeutic milieu, encourage group attendance and participation, medication management for mood stabilization and development of a comprehensive mental wellness plan.  Randolm Butte. 10/04/2023

## 2023-10-04 NOTE — [facility] Counselor (Signed)
 During interaction to complete PSA, pt declined interest in aftercare/follow up appointments.   Elvie Hammed. Johnnye Nancy, MSW, LCSW, LCAS 10/04/2023 4:01 PM

## 2023-10-04 NOTE — Progress Notes (Signed)
 [facility] MD Progress Note  10/04/2023 1:13 PM Roger [name]  MRN:  [id number]  The patient is a 33 year old man with chart history of bipolar vs schizoaffective disorder; hx of polysubstance abuse, depression; he presented to ED after, reportedly, his grandmother contacted crisis services due to reported concerns re: patient responding to internal stimuli, not bathing, trespassing on church property, and digging a large hole in the backyard last week with unclear intent. Grandmother has expressed concerns with patient being discharged and, per chart, she is pursuing legal guardianship for patient via APS with hope for long-term placement. Patient was admitted to [facility] unit for psychosis . On exam today patient is disengaged and internally preoccupied.  He requires frequent redirection and is easily redirectable.  He is alert and oriented to location and self.  Indicates he does not know why he is in the hospital when questioned specifically about taking the whole he indicates he wanted to take a swim and hide from the world.  He denies SI HI and AVH however he is observed responding to internal stimuli and has discussion with self during the interview.  He is a poor historian and is unable to meaningfully fully participate in the interview given his internal preoccupations.  He denied substance use UDS was positive for THC.  He denied previous mental health admissions however they are found on chart review.  He endorses some paranoia but does not elaborate.  Per chart review there is a history of prior suicide attempts.  Given current psychosis patient requires ongoing inpatient psychiatric hospitalization for medication management and stabilization due to impaired judgment, danger to self, and danger to others.   Subjective:  Chart reviewed, case discussed in multidisciplinary meeting, patient seen during rounds.   6/19: Patient seen for follow up they continue with bizarre behavior. Is more linear today  able to converse today. There are moments where they appear internally preoccupied but they are not observed responding to internal stimuli.  He maintains eye contact there is some paranoid continued about the grandmother he continues to be guarded about his hallucinations.  He struggles to discuss the hole in the ground and questions was that the size of a body he again does not express intent on what he was doing with the hole.  He denies adverse effects of medications and voices no concerns or complaints of at this time he required as needed medications overnight for sleep and anxiety trazodone  50 mg and Atarax  25 mg.Continue current course.    Sleep: Fair  Appetite:  Fair  Past Psychiatric History: see h&P Family History:  Family History  Problem Relation Age of Onset   Alcohol abuse Father    Social History:  Social History   Substance and Sexual Activity  Alcohol Use Yes   Alcohol/week: 1.0 standard drink of alcohol   Types: 1 Shots of liquor per week     Social History   Substance and Sexual Activity  Drug Use Yes   Types: Amphetamines, Cocaine, Marijuana, Benzodiazepines, MDMA (Ecstacy), Hydrocodone   Comment: only weed currently    Social History   Socioeconomic History   Marital status: Single    Spouse name: Not on file   Number of children: Not on file   Years of education: Not on file   Highest education level: Not on file  Occupational History   Not on file  Tobacco Use   Smoking status: Former    Current packs/day: 0.50    Average packs/day:  0.5 packs/day for 10.0 years (5.0 ttl pk-yrs)    Types: Cigarettes   Smokeless tobacco: Former    Quit date: 11/24/2014  Vaping Use   Vaping status: Never Used  Substance and Sexual Activity   Alcohol use: Yes    Alcohol/week: 1.0 standard drink of alcohol    Types: 1 Shots of liquor per week   Drug use: Yes    Types: Amphetamines, Cocaine, Marijuana, Benzodiazepines, MDMA (Ecstacy), Hydrocodone    Comment: only  weed currently   Sexual activity: Yes    Birth control/protection: None  Other Topics Concern   Not on file  Social History Narrative   Not on file   Social Drivers of Health   Financial Resource Strain: Not on file  Food Insecurity: No Food Insecurity (10/02/2023)   Hunger Vital Sign    Worried About Running Out of Food in the Last Year: Never true    Ran Out of Food in the Last Year: Never true  Transportation Needs: No Transportation Needs (10/02/2023)   PRAPARE - Administrator, Civil Service (Medical): No    Lack of Transportation (Non-Medical): No  Physical Activity: Not on file  Stress: Not on file  Social Connections: Not on file   Past Medical History:  Past Medical History:  Diagnosis Date   Depression    Headache    Substance abuse (HCC)     Past Surgical History:  Procedure Laterality Date   FEMUR FRACTURE SURGERY      Current Medications: Current Facility-Administered Medications  Medication Dose Route Frequency Provider Last Rate Last Admin   acetaminophen  (TYLENOL ) tablet 650 mg  650 mg Oral Q6H PRN Roise Cleaver, NP       alum & mag hydroxide-simeth (MAALOX/MYLANTA) 200-200-20 MG/5ML suspension 30 mL  30 mL Oral Q4H PRN Roise Cleaver, NP       benztropine  (COGENTIN ) tablet 1 mg  1 mg Oral BID PRN Roise Cleaver, NP       Or   benztropine  mesylate (COGENTIN ) injection 1 mg  1 mg Intramuscular BID PRN Roise Cleaver, NP       hydrOXYzine  (ATARAX ) tablet 25 mg  25 mg Oral TID PRN Roise Cleaver, NP   25 mg at 10/03/23 1146   magnesium  hydroxide (MILK OF MAGNESIA) suspension 30 mL  30 mL Oral Daily PRN Roise Cleaver, NP       OLANZapine  (ZYPREXA ) injection 10 mg  10 mg Intramuscular TID PRN Roise Cleaver, NP       OLANZapine  (ZYPREXA ) injection 5 mg  5 mg Intramuscular TID PRN Roise Cleaver, NP       OLANZapine  (ZYPREXA ) tablet 15 mg  15 mg Oral QHS Zeanna Sunde E, PA-C   15 mg at 10/03/23 2105   OLANZapine  zydis  (ZYPREXA ) disintegrating tablet 5 mg  5 mg Oral TID PRN Roise Cleaver, NP       traZODone  (DESYREL ) tablet 50 mg  50 mg Oral QHS PRN Hani Campusano E, PA-C   50 mg at 10/03/23 2105    Lab Results: No results found for this or any previous visit (from the past 48 hours).  Blood Alcohol level:  Lab Results  Component Value Date   [facility] <15 10/01/2023   ETH <10 01/18/2023    Metabolic Disorder Labs: Lab Results  Component Value Date   HGBA1C 5.9 (H) 04/05/2021   MPG 123 04/05/2021   MPG 119.76 05/12/2020   Lab Results  Component Value Date   PROLACTIN 25.5 (  H) 12/25/2015   Lab Results  Component Value Date   CHOL 159 04/05/2021   TRIG 70 04/05/2021   HDL 59 04/05/2021   CHOLHDL 2.7 04/05/2021   VLDL 14 04/05/2021   LDLCALC 86 04/05/2021   LDLCALC 81 05/12/2020    Physical Findings: AIMS:  , ,  ,  ,    CIWA:    COWS:      Psychiatric Specialty Exam:  Presentation  General Appearance:  Disheveled  Eye Contact: Fleeting  Speech: Normal Rate  Speech Volume: Normal    Mood and Affect  Mood: Euthymic  Affect: Blunt   Thought Process  Thought Processes: -- (More linear today, still with disorganization)  Descriptions of Associations:Loose  Orientation:Partial  Thought Content:Illogical; Paranoid Ideation  Hallucinations:Hallucinations: Auditory Description of Auditory Hallucinations: denies command, is guarded to nature of hallucinations  Ideas of Reference:None  Suicidal Thoughts:Suicidal Thoughts: No  Homicidal Thoughts:Homicidal Thoughts: No   Sensorium  Memory: Immediate Poor; Recent Poor  Judgment: Impaired  Insight: Lacking   Executive Functions  Concentration: Fair  Attention Span: Poor  Recall: [employer] of Knowledge: Fair  Language: Fair   Psychomotor Activity  Psychomotor Activity: Psychomotor Activity: Restlessness  Musculoskeletal: Strength & Muscle Tone: within normal limits Gait &  Station: normal Assets  Assets: Social Support    Physical Exam: Physical Exam Vitals and nursing note reviewed.  HENT:     Head: Atraumatic.   Eyes:     Extraocular Movements: Extraocular movements intact.   Pulmonary:     Effort: Pulmonary effort is normal.   Neurological:     Mental Status: He is alert and oriented to person, place, and time.    Review of Systems  Psychiatric/Behavioral:  Positive for hallucinations. Negative for depression, substance abuse and suicidal ideas. The patient is not nervous/anxious and does not have insomnia.    Blood pressure 100/64, pulse (!) 55, temperature 98.6 F (37 C), temperature source Oral, resp. rate 18, height 5' 6 (1.676 m), weight 56.2 kg, SpO2 100%. Body mass index is 20.01 kg/m.  Diagnosis: Principal Problem:   Psychosis (HCC)   PLAN: Safety and Monitoring:  -- Voluntary admission to inpatient psychiatric unit for safety, stabilization and treatment  -- Daily contact with patient to assess and evaluate symptoms and progress in treatment  -- Patient's case to be discussed in multi-disciplinary team meeting  -- Observation Level : q15 minute checks  -- Vital signs:  q12 hours  -- Precautions: suicide, elopement, and assault -- Encouraged patient to participate in unit milieu and in scheduled group therapies  2. Psychiatric Diagnoses and Treatment:  Schizoaffective vs bipolar disorder.             - pt with acute psychosis with hallucinations, disorganization, and self care deficit. Pt unable to fully participate in exam given internal preoccupation. Patient continues to require inpatient psychiatric hospitalization due to severely impaired judgement, danger to self, and others.   6/19: Continues with psychosis some improvement. Continues to require inpatient hospitalization. Will continue current dose of zyprexa .              - Continue  zyprexa  to 15 mg nightly, Started 10/03/23   -- The  risks/benefits/side-effects/alternatives to this medication were discussed in detail with the patient and time was given for questions. The patient consents to medication trial.                -- Metabolic profile and EKG monitoring obtained while on an atypical  antipsychotic (BMI: Lipid Panel: HbgA1c: QTc:394)             -- Encouraged patient to participate in unit milieu and in scheduled group therapies                            3. Medical Issues Being Addressed:   no acute concerns   4. Discharge Planning:              -- Social work and case management to assist with discharge planning and identification of hospital follow-up needs prior to discharge             -- Estimated LOS: 5-7 days             -- Discharge Concerns: Need to establish a safety plan; Medication compliance and effectiveness             -- Discharge Goals: Return home with outpatient referrals follow ups Fay Hoop, PA-C 10/04/2023, 1:13 PM

## 2023-10-04 NOTE — Group Note (Signed)
 Recreation Therapy Group Note   Group Topic:Health and Wellness  Group Date: 10/04/2023 Start Time: 1000 End Time: 1100 Facilitators: Deatrice Factor, LRT, CTRS Location: Courtyard  Group Description: [employer]. LRT and patients played games of basketball, drew with chalk, and played corn hole while outside in the courtyard while getting fresh air and sunlight. Music was being played in the background. LRT and peers conversed about different games they have played before, what they do in their free time and anything else that is on their minds. LRT encouraged pts to drink water after being outside, sweating and getting their heart rate up.  Goal Area(s) Addressed: Patient will build on frustration tolerance skills. Patients will partake in a competitive play game with peers. Patients will gain knowledge of new leisure interest/hobby.    Affect/Mood: N/A   Participation Level: Did not attend    Clinical Observations/Individualized Feedback: Patient did not attend group.   Plan: Continue to engage patient in RT group sessions 2-3x/week.   Deatrice Factor, LRT, CTRS 10/04/2023 11:40 AM

## 2023-10-05 DIAGNOSIS — F29 Unspecified psychosis not due to a substance or known physiological condition: Secondary | ICD-10-CM | POA: Diagnosis not present

## 2023-10-05 LAB — HEMOGLOBIN A1C
Hgb A1c MFr Bld: 5.6 % (ref 4.8–5.6)
Mean Plasma Glucose: 114.02 mg/dL

## 2023-10-05 LAB — LIPID PANEL
Cholesterol: 191 mg/dL (ref 0–200)
HDL: 49 mg/dL (ref >40)
LDL Cholesterol: 103 mg/dL — ABNORMAL HIGH (ref 0–99)
Total CHOL/HDL Ratio: 3.9 ratio
Triglycerides: 193 mg/dL — ABNORMAL HIGH (ref <150)
VLDL: 39 mg/dL (ref 0–40)

## 2023-10-05 NOTE — Group Note (Signed)
 Recreation Therapy Group Note   Group Topic:Leisure Education  Group Date: 10/05/2023 Start Time: 1530 End Time: 1630 Facilitators: Yvonna Herder, CTRS Location: Craft Room  Group Description: Leisure. Patients were given the option to choose from singing karaoke, coloring mandalas, using oil pastels, journaling, or playing with play-doh. LRT and pts discussed the meaning of leisure, the importance of participating in leisure during their free time/when they're outside of the hospital, as well as how our leisure interests can also serve as coping skills.   Goal Area(s) Addressed:  Patient will identify a current leisure interest.  Patient will learn the definition of "leisure". Patient will practice making a positive decision. Patient will have the opportunity to try a new leisure activity. Patient will communicate with peers and LRT.    Affect/Mood: N/A   Participation Level: Did not attend    Clinical Observations/Individualized Feedback: Patient did not attend group.   Plan: Continue to engage patient in RT group sessions 2-3x/week.   Deatrice Factor, LRT, CTRS 10/05/2023 5:43 PM

## 2023-10-05 NOTE — Group Note (Signed)
 Date:  10/05/2023 Time:  3:06 PM  Group Topic/Focus:  Crisis Planning:   The purpose of this group is to help patients create a crisis plan for use upon discharge or in the future, as needed.    Participation Level:  Did Not Attend   Marianna Shirk Taccara Bushnell 10/05/2023, 3:06 PM

## 2023-10-05 NOTE — Progress Notes (Signed)
 [facility] MD Progress Note  10/05/2023 7:20 PM Roger [name] Caylor [place]  MRN:  [id number]  The patient is a 33 year old man with chart history of bipolar vs schizoaffective disorder; hx of polysubstance abuse, depression; he presented to ED after, reportedly, his grandmother contacted crisis services due to reported concerns re: patient responding to internal stimuli, not bathing, trespassing on church property, and digging a large hole in the backyard last week with unclear intent. Grandmother has expressed concerns with patient being discharged and, per chart, she is pursuing legal guardianship for patient via APS with hope for long-term placement. Patient was admitted to [facility] unit for psychosis . On exam today patient is disengaged and internally preoccupied.  He requires frequent redirection and is easily redirectable.  He is alert and oriented to location and self.  Indicates he does not know why he is in the hospital when questioned specifically about taking the whole he indicates he wanted to take a swim and hide from the world.  He denies SI HI and AVH however he is observed responding to internal stimuli and has discussion with self during the interview.  He is a poor historian and is unable to meaningfully fully participate in the interview given his internal preoccupations.  He denied substance use UDS was positive for THC.  He denied previous mental health admissions however they are found on chart review.  He endorses some paranoia but does not elaborate.  Per chart review there is a history of prior suicide attempts.  Given current psychosis patient requires ongoing inpatient psychiatric hospitalization for medication management and stabilization due to impaired judgment, danger to self, and danger to others.   Subjective:  Chart reviewed, case discussed in multidisciplinary meeting, patient seen during rounds.   6/20: Patient seen today for follow-up they continue with bizarre behavior still somewhat  more linear today.  Maintains eye contact during conversation continues to exhibit paranoia continues to have brief conversations.  Does not meaningfully participate in exam.  Utilize as needed trazodone  overnight.  6/19: Patient seen for follow up they continue with bizarre behavior. Is more linear today able to converse today. There are moments where they appear internally preoccupied but they are not observed responding to internal stimuli.  He maintains eye contact there is some paranoid continued about the grandmother he continues to be guarded about his hallucinations.  He struggles to discuss the hole in the ground and questions was that the size of a body he again does not express intent on what he was doing with the hole.  He denies adverse effects of medications and voices no concerns or complaints of at this time he required as needed medications overnight for sleep and anxiety trazodone  50 mg and Atarax  25 mg.Continue current course.    Sleep: Fair  Appetite:  Fair  Past Psychiatric History: see h&P Family History:  Family History  Problem Relation Age of Onset   Alcohol abuse Father    Social History:  Social History   Substance and Sexual Activity  Alcohol Use Yes   Alcohol/week: 1.0 standard drink of alcohol   Types: 1 Shots of liquor per week     Social History   Substance and Sexual Activity  Drug Use Yes   Types: Amphetamines, Cocaine, Marijuana, Benzodiazepines, MDMA (Ecstacy), Hydrocodone   Comment: only weed currently    Social History   Socioeconomic History   Marital status: Single    Spouse name: Not on file   Number of children: Not  on file   Years of education: Not on file   Highest education level: Not on file  Occupational History   Not on file  Tobacco Use   Smoking status: Former    Current packs/day: 0.50    Average packs/day: 0.5 packs/day for 10.0 years (5.0 ttl pk-yrs)    Types: Cigarettes   Smokeless tobacco: Former    Quit date:  11/24/2014  Vaping Use   Vaping status: Never Used  Substance and Sexual Activity   Alcohol use: Yes    Alcohol/week: 1.0 standard drink of alcohol    Types: 1 Shots of liquor per week   Drug use: Yes    Types: Amphetamines, Cocaine, Marijuana, Benzodiazepines, MDMA (Ecstacy), Hydrocodone    Comment: only weed currently   Sexual activity: Yes    Birth control/protection: None  Other Topics Concern   Not on file  Social History Narrative   Not on file   Social Drivers of Health   Financial Resource Strain: Not on file  Food Insecurity: No Food Insecurity (10/02/2023)   Hunger Vital Sign    Worried About Running Out of Food in the Last Year: Never true    Ran Out of Food in the Last Year: Never true  Transportation Needs: No Transportation Needs (10/02/2023)   PRAPARE - Administrator, Civil Service (Medical): No    Lack of Transportation (Non-Medical): No  Physical Activity: Not on file  Stress: Not on file  Social Connections: Not on file   Past Medical History:  Past Medical History:  Diagnosis Date   Depression    Headache    Substance abuse (HCC)     Past Surgical History:  Procedure Laterality Date   FEMUR FRACTURE SURGERY      Current Medications: Current Facility-Administered Medications  Medication Dose Route Frequency Provider Last Rate Last Admin   acetaminophen  (TYLENOL ) tablet 650 mg  650 mg Oral Q6H PRN Roise Cleaver, NP       alum & mag hydroxide-simeth (MAALOX/MYLANTA) 200-200-20 MG/5ML suspension 30 mL  30 mL Oral Q4H PRN Roise Cleaver, NP       benztropine  (COGENTIN ) tablet 1 mg  1 mg Oral BID PRN Roise Cleaver, NP       Or   benztropine  mesylate (COGENTIN ) injection 1 mg  1 mg Intramuscular BID PRN Roise Cleaver, NP       hydrOXYzine  (ATARAX ) tablet 25 mg  25 mg Oral TID PRN Roise Cleaver, NP   25 mg at 10/03/23 1146   magnesium  hydroxide (MILK OF MAGNESIA) suspension 30 mL  30 mL Oral Daily PRN Roise Cleaver, NP        OLANZapine  (ZYPREXA ) injection 10 mg  10 mg Intramuscular TID PRN Roise Cleaver, NP       OLANZapine  (ZYPREXA ) injection 5 mg  5 mg Intramuscular TID PRN Roise Cleaver, NP       OLANZapine  (ZYPREXA ) tablet 15 mg  15 mg Oral QHS Kristofer Schaffert E, PA-C   15 mg at 10/04/23 2126   OLANZapine  zydis (ZYPREXA ) disintegrating tablet 5 mg  5 mg Oral TID PRN Roise Cleaver, NP       traZODone  (DESYREL ) tablet 50 mg  50 mg Oral QHS PRN Adonys Wildes E, PA-C   50 mg at 10/04/23 2126    Lab Results:  Results for orders placed or performed during the hospital encounter of 10/02/23 (from the past 48 hours)  Lipid panel     Status: Abnormal   Collection  Time: 10/05/23  1:28 PM  Result Value Ref Range   Cholesterol 191 0 - 200 mg/dL   Triglycerides 098 (H) <150 mg/dL   HDL 49 >11 mg/dL   Total CHOL/HDL Ratio 3.9 RATIO   VLDL 39 0 - 40 mg/dL   LDL Cholesterol 914 (H) 0 - 99 mg/dL    Comment:        Total Cholesterol/HDL:CHD Risk Coronary Heart Disease Risk Table                     Men   Women  1/2 Average Risk   3.4   3.3  Average Risk       5.0   4.4  2 X Average Risk   9.6   7.1  3 X Average Risk  23.4   11.0        Use the calculated Patient Ratio above and the CHD Risk Table to determine the patient's CHD Risk.        ATP III CLASSIFICATION (LDL):  <100     mg/dL   Optimal  782-956  mg/dL   Near or Above                    Optimal  130-159  mg/dL   Borderline  213-086  mg/dL   High  >578     mg/dL   Very High Performed at [facility], [address] [place], [place] [address]     Blood Alcohol level:  Lab Results  Component Value Date   [facility] <15 10/01/2023   ETH <10 01/18/2023    Metabolic Disorder Labs: Lab Results  Component Value Date   HGBA1C 5.9 (H) 04/05/2021   MPG 123 04/05/2021   MPG 119.76 05/12/2020   Lab Results  Component Value Date   PROLACTIN 25.5 (H) 12/25/2015   Lab Results  Component Value Date   CHOL 191 10/05/2023    TRIG 193 (H) 10/05/2023   HDL 49 10/05/2023   CHOLHDL 3.9 10/05/2023   VLDL 39 10/05/2023   LDLCALC 103 (H) 10/05/2023   LDLCALC 86 04/05/2021    Physical Findings: AIMS:  , ,  ,  ,    CIWA:    COWS:      Psychiatric Specialty Exam:  Presentation  General Appearance:  Disheveled  Eye Contact: Fleeting  Speech: Normal Rate  Speech Volume: Normal    Mood and Affect  Mood: Euthymic  Affect: Blunt   Thought Process  Thought Processes: -- (More linear today, still with disorganization)  Descriptions of Associations:Loose  Orientation:Partial  Thought Content:Illogical; Paranoid Ideation  Hallucinations:Hallucinations: Auditory Description of Auditory Hallucinations: denies command, is guarded to nature of hallucinations  Ideas of Reference:None  Suicidal Thoughts:Suicidal Thoughts: No  Homicidal Thoughts:Homicidal Thoughts: No   Sensorium  Memory: Immediate Poor; Recent Poor  Judgment: Impaired  Insight: Lacking   Executive Functions  Concentration: Fair  Attention Span: Poor  Recall: [employer] of Knowledge: Fair  Language: Fair   Psychomotor Activity  Psychomotor Activity: Psychomotor Activity: Restlessness  Musculoskeletal: Strength & Muscle Tone: within normal limits Gait & Station: normal Assets  Assets: Social Support    Physical Exam: Physical Exam Vitals and nursing note reviewed.  HENT:     Head: Atraumatic.   Eyes:     Extraocular Movements: Extraocular movements intact.   Pulmonary:     Effort: Pulmonary effort is normal.   Neurological:     Mental Status: He is  alert and oriented to person, place, and time.    Review of Systems  Psychiatric/Behavioral:  Positive for hallucinations. Negative for depression, substance abuse and suicidal ideas. The patient is not nervous/anxious and does not have insomnia.    Blood pressure (!) 141/87, pulse 82, temperature (!) 97.4 F (36.3 C), resp. rate 20,  height 5' 6 (1.676 m), weight 56.2 kg, SpO2 100%. Body mass index is 20.01 kg/m.  Diagnosis: Principal Problem:   Psychosis (HCC)   PLAN: Safety and Monitoring:  -- Voluntary admission to inpatient psychiatric unit for safety, stabilization and treatment  -- Daily contact with patient to assess and evaluate symptoms and progress in treatment  -- Patient's case to be discussed in multi-disciplinary team meeting  -- Observation Level : q15 minute checks  -- Vital signs:  q12 hours  -- Precautions: suicide, elopement, and assault -- Encouraged patient to participate in unit milieu and in scheduled group therapies  2. Psychiatric Diagnoses and Treatment:  Schizoaffective vs bipolar disorder.             - pt with acute psychosis with hallucinations, disorganization, and self care deficit. Pt unable to fully participate in exam given internal preoccupation. Patient continues to require inpatient psychiatric hospitalization due to severely impaired judgement, danger to self, and others.   6/19: Continues with psychosis some improvement. Continues to require inpatient hospitalization. Will continue current dose of zyprexa .              - Continue  zyprexa  to 15 mg nightly, Started 10/03/23   6/20: Continues with psychosis will continue zyprexa  consider adding abilify if no improvement tomorrow.  -- The risks/benefits/side-effects/alternatives to this medication were discussed in detail with the patient and time was given for questions. The patient consents to medication trial.                -- Metabolic profile and EKG monitoring obtained while on an atypical antipsychotic (BMI: Lipid Panel: HbgA1c: QTc:394)             -- Encouraged patient to participate in unit milieu and in scheduled group therapies                            3. Medical Issues Being Addressed:   no acute concerns   4. Discharge Planning:              -- Social work and case management to assist with discharge planning  and identification of hospital follow-up needs prior to discharge             -- Estimated LOS: 5-7 days             -- Discharge Concerns: Need to establish a safety plan; Medication compliance and effectiveness             -- Discharge Goals: Return home with outpatient referrals follow ups Fay Hoop, PA-C 10/05/2023, 7:20 PM

## 2023-10-05 NOTE — Plan of Care (Signed)

## 2023-10-05 NOTE — Group Note (Signed)
 Recreation Therapy Group Note   Group Topic:Stress Management  Group Date: 10/05/2023 Start Time: 1100 End Time: 1200 Facilitators: Deatrice Factor, LRT, CTRS Location: Courtyard  Group Description: [employer]. LRT and patients played games of basketball, drew with chalk, and played corn hole while outside in the courtyard while getting fresh air and sunlight. Music was being played in the background. LRT and peers conversed about different games they have played before, what they do in their free time and anything else that is on their minds. LRT encouraged pts to drink water after being outside, sweating and getting their heart rate up.  Goal Area(s) Addressed: Patient will build on frustration tolerance skills. Patients will partake in a competitive play game with peers. Patients will gain knowledge of new leisure interest/hobby.    Affect/Mood: Appropriate   Participation Level: Minimal    Clinical Observations/Individualized Feedback: Roger [name] was in and out of group multiple times. Pt was present for less than half of group.   Plan: Continue to engage patient in RT group sessions 2-3x/week.   [address] LRT, CTRS 10/05/2023 1:38 PM

## 2023-10-06 DIAGNOSIS — F29 Unspecified psychosis not due to a substance or known physiological condition: Secondary | ICD-10-CM | POA: Diagnosis not present

## 2023-10-06 MED ORDER — OLANZAPINE 10 MG PO TABS
10.0000 mg | ORAL_TABLET | Freq: Two times a day (BID) | ORAL | Status: DC
  Administered 2023-10-06 – 2023-10-08 (×4): 10 mg via ORAL
  Filled 2023-10-06 (×4): qty 1

## 2023-10-06 NOTE — Group Note (Signed)
 Date:  10/06/2023 Time:  4:05 AM  Group Topic/Focus:  Dimensions of Wellness:   The focus of this group is to introduce the topic of wellness and discuss the role each dimension of wellness plays in total health. Goals Group:   The focus of this group is to help patients establish daily goals to achieve during treatment and discuss how the patient can incorporate goal setting into their daily lives to aide in recovery. Self Care:   The focus of this group is to help patients understand the importance of self-care in order to improve or restore emotional, physical, spiritual, interpersonal, and financial health.    Participation Level:  Active  Participation Quality:  Appropriate  Affect:  Appropriate  Cognitive:  Appropriate and Oriented  Insight: Appropriate  Engagement in Group:  Engaged  Modes of Intervention:  Discussion and Support  Additional Comments:  N/A  Kerri Katz 10/06/2023, 4:05 AM

## 2023-10-06 NOTE — Progress Notes (Signed)
   10/05/23 2100  Psych Admission Type (Psych Patients Only)  Admission Status Involuntary  Psychosocial Assessment  Patient Complaints None  Eye Contact Fair  Facial Expression Anxious;Flat  Affect Sullen  Speech Logical/coherent  Interaction Assertive  Motor Activity Pacing  Appearance/Hygiene In scrubs  Behavior Characteristics Cooperative  Mood Anxious  Thought Process  Coherency WDL  Content WDL  Delusions None reported or observed  Perception Hallucinations  Hallucination Auditory  Judgment Limited  Confusion Mild  Danger to Self  Current suicidal ideation? Denies  Danger to Others  Danger to Others None reported or observed

## 2023-10-06 NOTE — Progress Notes (Signed)
   10/06/23 1000  Psych Admission Type (Psych Patients Only)  Admission Status Involuntary  Psychosocial Assessment  Patient Complaints Sleep disturbance (patient states his sleep was alright last night, it was a few bumps in the road, but I made it.)  Eye Contact Brief  Facial Expression Flat  Affect Preoccupied  Speech Soft  Interaction No initiation;Minimal  Motor Activity Pacing;Restless;Slow  Appearance/Hygiene In scrubs;Body odor;Bizarre;Poor hygiene  Behavior Characteristics Cooperative;Appropriate to situation  Mood Preoccupied;Pleasant  Aggressive Behavior  Effect No apparent injury  Thought Process  Coherency Disorganized;Loose associations  Content Preoccupation  Delusions None reported or observed  Perception Hallucinations  Hallucination Auditory;Visual (although patient denies, he is definitely responding to internal stimuli)  Judgment Limited  Confusion WDL  Danger to Self  Current suicidal ideation? Denies  Danger to Others  Danger to Others None reported or observed   Patient states overall he is feeling pretty good, I can't complain.

## 2023-10-06 NOTE — Progress Notes (Signed)
 [facility] MD Progress Note  10/06/2023 2:38 PM Roger [name]  MRN:  [id number]  The patient is a 33 year old man with chart history of bipolar vs schizoaffective disorder; hx of polysubstance abuse, depression; he presented to ED after, reportedly, his grandmother contacted crisis services due to reported concerns re: patient responding to internal stimuli, not bathing, trespassing on church property, and digging a large hole in the backyard last week with unclear intent. Grandmother has expressed concerns with patient being discharged and, per chart, she is pursuing legal guardianship for patient via APS with hope for long-term placement. Patient was admitted to [facility] unit for psychosis . On exam today patient is disengaged and internally preoccupied.  He requires frequent redirection and is easily redirectable.  He is alert and oriented to location and self.  Indicates he does not know why he is in the hospital when questioned specifically about taking the whole he indicates he wanted to take a swim and hide from the world.  He denies SI HI and AVH however he is observed responding to internal stimuli and has discussion with self during the interview.  He is a poor historian and is unable to meaningfully fully participate in the interview given his internal preoccupations.  He denied substance use UDS was positive for THC.  He denied previous mental health admissions however they are found on chart review.  He endorses some paranoia but does not elaborate.  Per chart review there is a history of prior suicide attempts.  Given current psychosis patient requires ongoing inpatient psychiatric hospitalization for medication management and stabilization due to impaired judgment, danger to self, and danger to others.   Subjective:  Chart reviewed, case discussed in multidisciplinary meeting, patient seen during rounds.   6/21: Continue with bizarre and disorganized behavior.  They continue to be pleasant and  cooperative on exam.  Today they are unable to note where they are at or why they are hospitalized.  They continue to be observed responding to internal stimuli.  They continue to note some paranoia.  They deny thoughts of harming himself or others.  They do not require PRNs overnight.  Will increase Zyprexa  to 10 mg twice daily given ongoing psychosis.  6/20: Patient seen today for follow-up they continue with bizarre behavior still somewhat more linear today.  Maintains eye contact during conversation continues to exhibit paranoia continues to have brief conversations.  Does not meaningfully participate in exam.  Utilize as needed trazodone  overnight.  6/19: Patient seen for follow up they continue with bizarre behavior. Is more linear today able to converse today. There are moments where they appear internally preoccupied but they are not observed responding to internal stimuli.  He maintains eye contact there is some paranoid continued about the grandmother he continues to be guarded about his hallucinations.  He struggles to discuss the hole in the ground and questions was that the size of a body he again does not express intent on what he was doing with the hole.  He denies adverse effects of medications and voices no concerns or complaints of at this time he required as needed medications overnight for sleep and anxiety trazodone  50 mg and Atarax  25 mg.Continue current course.    Sleep: Fair  Appetite:  Fair  Past Psychiatric History: see h&P Family History:  Family History  Problem Relation Age of Onset   Alcohol abuse Father    Social History:  Social History   Substance and Sexual Activity  Alcohol  Use Yes   Alcohol/week: 1.0 standard drink of alcohol   Types: 1 Shots of liquor per week     Social History   Substance and Sexual Activity  Drug Use Yes   Types: Amphetamines, Cocaine, Marijuana, Benzodiazepines, MDMA (Ecstacy), Hydrocodone   Comment: only weed currently     Social History   Socioeconomic History   Marital status: Single    Spouse name: Not on file   Number of children: Not on file   Years of education: Not on file   Highest education level: Not on file  Occupational History   Not on file  Tobacco Use   Smoking status: Former    Current packs/day: 0.50    Average packs/day: 0.5 packs/day for 10.0 years (5.0 ttl pk-yrs)    Types: Cigarettes   Smokeless tobacco: Former    Quit date: 11/24/2014  Vaping Use   Vaping status: Never Used  Substance and Sexual Activity   Alcohol use: Yes    Alcohol/week: 1.0 standard drink of alcohol    Types: 1 Shots of liquor per week   Drug use: Yes    Types: Amphetamines, Cocaine, Marijuana, Benzodiazepines, MDMA (Ecstacy), Hydrocodone    Comment: only weed currently   Sexual activity: Yes    Birth control/protection: None  Other Topics Concern   Not on file  Social History Narrative   Not on file   Social Drivers of Health   Financial Resource Strain: Not on file  Food Insecurity: No Food Insecurity (10/02/2023)   Hunger Vital Sign    Worried About Running Out of Food in the Last Year: Never true    Ran Out of Food in the Last Year: Never true  Transportation Needs: No Transportation Needs (10/02/2023)   PRAPARE - Administrator, Civil Service (Medical): No    Lack of Transportation (Non-Medical): No  Physical Activity: Not on file  Stress: Not on file  Social Connections: Not on file   Past Medical History:  Past Medical History:  Diagnosis Date   Depression    Headache    Substance abuse (HCC)     Past Surgical History:  Procedure Laterality Date   FEMUR FRACTURE SURGERY      Current Medications: Current Facility-Administered Medications  Medication Dose Route Frequency Provider Last Rate Last Admin   acetaminophen  (TYLENOL ) tablet 650 mg  650 mg Oral Q6H PRN Mardy Legacy, NP       alum & mag hydroxide-simeth (MAALOX/MYLANTA) 200-200-20 MG/5ML suspension 30 mL   30 mL Oral Q4H PRN Mardy Legacy, NP       benztropine  (COGENTIN ) tablet 1 mg  1 mg Oral BID PRN Mardy Legacy, NP       Or   benztropine  mesylate (COGENTIN ) injection 1 mg  1 mg Intramuscular BID PRN Mardy Legacy, NP       hydrOXYzine  (ATARAX ) tablet 25 mg  25 mg Oral TID PRN Mardy Legacy, NP   25 mg at 10/03/23 1146   magnesium  hydroxide (MILK OF MAGNESIA) suspension 30 mL  30 mL Oral Daily PRN Mardy Legacy, NP       OLANZapine  (ZYPREXA ) injection 10 mg  10 mg Intramuscular TID PRN Mardy Legacy, NP       OLANZapine  (ZYPREXA ) injection 5 mg  5 mg Intramuscular TID PRN Mardy Legacy, NP       OLANZapine  (ZYPREXA ) tablet 10 mg  10 mg Oral BID Jarrell Armond E, PA-C       OLANZapine  zydis (ZYPREXA )  disintegrating tablet 5 mg  5 mg Oral TID PRN Mardy Legacy, NP       traZODone  (DESYREL ) tablet 50 mg  50 mg Oral QHS PRN Jabe Jeanbaptiste E, PA-C   50 mg at 10/04/23 2126    Lab Results:  Results for orders placed or performed during the hospital encounter of 10/02/23 (from the past 48 hours)  Hemoglobin A1c     Status: None   Collection Time: 10/05/23  1:28 PM  Result Value Ref Range   Hgb A1c MFr Bld 5.6 4.8 - 5.6 %    Comment: (NOTE) Diagnosis of Diabetes The following HbA1c ranges recommended by the American Diabetes Association (ADA) may be used as an aid in the diagnosis of diabetes mellitus.  Hemoglobin             Suggested A1C NGSP%              Diagnosis  <5.7                   Non Diabetic  5.7-6.4                Pre-Diabetic  >6.4                   Diabetic  <7.0                   Glycemic control for                       adults with diabetes.     Mean Plasma Glucose 114.02 mg/dL    Comment: Performed at [facility], [address] [place], [place] [address]  Lipid panel     Status: Abnormal   Collection Time: 10/05/23  1:28 PM  Result Value Ref Range   Cholesterol 191 0 - 200 mg/dL   Triglycerides 806 (H) <150 mg/dL    HDL 49 >59 mg/dL   Total CHOL/HDL Ratio 3.9 RATIO   VLDL 39 0 - 40 mg/dL   LDL Cholesterol 896 (H) 0 - 99 mg/dL    Comment:        Total Cholesterol/HDL:CHD Risk Coronary Heart Disease Risk Table                     Men   Women  1/2 Average Risk   3.4   3.3  Average Risk       5.0   4.4  2 X Average Risk   9.6   7.1  3 X Average Risk  23.4   11.0        Use the calculated Patient Ratio above and the CHD Risk Table to determine the patient's CHD Risk.        ATP III CLASSIFICATION (LDL):  <100     mg/dL   Optimal  899-870  mg/dL   Near or Above                    Optimal  130-159  mg/dL   Borderline  839-810  mg/dL   High  >809     mg/dL   Very High Performed at [facility], [address], [place], [place] [address]     Blood Alcohol level:  Lab Results  Component Value Date   [facility] <15 10/01/2023   ETH <10 01/18/2023    Metabolic Disorder Labs: Lab Results  Component Value Date   HGBA1C  5.6 10/05/2023   MPG 114.02 10/05/2023   MPG 123 04/05/2021   Lab Results  Component Value Date   PROLACTIN 25.5 (H) 12/25/2015   Lab Results  Component Value Date   CHOL 191 10/05/2023   TRIG 193 (H) 10/05/2023   HDL 49 10/05/2023   CHOLHDL 3.9 10/05/2023   VLDL 39 10/05/2023   LDLCALC 103 (H) 10/05/2023   LDLCALC 86 04/05/2021    Physical Findings: AIMS:  , ,  ,  ,    CIWA:    COWS:      Psychiatric Specialty Exam:  Presentation  General Appearance:  Disheveled  Eye Contact: Fleeting  Speech: Normal Rate  Speech Volume: Normal    Mood and Affect  Mood: Euthymic  Affect: Blunt   Thought Process  Thought Processes: -- (More linear today, still with disorganization)  Descriptions of Associations:Loose  Orientation:Partial  Thought Content:Illogical; Paranoid Ideation  Hallucinations:No data recorded  Ideas of Reference:None  Suicidal Thoughts:No data recorded  Homicidal Thoughts:No data recorded   Sensorium   Memory: Immediate Poor; Recent Poor  Judgment: Impaired  Insight: Lacking   Executive Functions  Concentration: Fair  Attention Span: Poor  Recall: [employer] of Knowledge: Fair  Language: Fair   Psychomotor Activity  Psychomotor Activity: No data recorded  Musculoskeletal: Strength & Muscle Tone: within normal limits Gait & Station: normal Assets  Assets: Social Support    Physical Exam: Physical Exam Vitals and nursing note reviewed.  HENT:     Head: Atraumatic.   Eyes:     Extraocular Movements: Extraocular movements intact.   Pulmonary:     Effort: Pulmonary effort is normal.   Neurological:     Mental Status: He is alert and oriented to person, place, and time.    Review of Systems  Psychiatric/Behavioral:  Positive for hallucinations. Negative for depression, substance abuse and suicidal ideas. The patient is not nervous/anxious and does not have insomnia.    Blood pressure (!) 141/87, pulse 82, temperature (!) 97.4 F (36.3 C), resp. rate 20, height 5' 6 (1.676 m), weight 56.2 kg, SpO2 100%. Body mass index is 20.01 kg/m.  Diagnosis: Principal Problem:   Psychosis (HCC)   PLAN: Safety and Monitoring:  -- Voluntary admission to inpatient psychiatric unit for safety, stabilization and treatment  -- Daily contact with patient to assess and evaluate symptoms and progress in treatment  -- Patient's case to be discussed in multi-disciplinary team meeting  -- Observation Level : q15 minute checks  -- Vital signs:  q12 hours  -- Precautions: suicide, elopement, and assault -- Encouraged patient to participate in unit milieu and in scheduled group therapies  2. Psychiatric Diagnoses and Treatment:  Schizoaffective vs bipolar disorder.             - pt with acute psychosis with hallucinations, disorganization, and self care deficit. Pt unable to fully participate in exam given internal preoccupation. Patient continues to require inpatient  psychiatric hospitalization due to severely impaired judgement, danger to self, and others.   6/19: Continues with psychosis some improvement. Continues to require inpatient hospitalization. Will continue current dose of zyprexa .              - Continue  zyprexa  to 15 mg nightly, Started 10/03/23   6/20: Continues with psychosis will continue zyprexa  consider adding abilify if no improvement tomorrow.   6/21: increase zyprexa  to 10 mg BID  -- The risks/benefits/side-effects/alternatives to this medication were discussed in detail with the patient and  time was given for questions. The patient consents to medication trial.                -- Metabolic profile and EKG monitoring obtained while on an atypical antipsychotic (BMI: Lipid Panel: HbgA1c: QTc:394)             -- Encouraged patient to participate in unit milieu and in scheduled group therapies                            3. Medical Issues Being Addressed:   no acute concerns   4. Discharge Planning:              -- Social work and case management to assist with discharge planning and identification of hospital follow-up needs prior to discharge             -- Estimated LOS: 5-7 days             -- Discharge Concerns: Need to establish a safety plan; Medication compliance and effectiveness             -- Discharge Goals: Return home with outpatient referrals follow ups Donnice FORBES Right, PA-C 10/06/2023, 2:38 PM

## 2023-10-06 NOTE — Plan of Care (Signed)
  Problem: Education: Goal: Knowledge of  General Education information/materials will improve Outcome: Not Met (add Reason) Goal: Emotional status will improve Outcome: Not Met (add Reason) Goal: Mental status will improve Outcome: Not Met (add Reason) Goal: Verbalization of understanding the information provided will improve Outcome: Not Met (add Reason)   Problem: Activity: Goal: Interest or engagement in activities will improve Outcome: Not Met (add Reason) Goal: Sleeping patterns will improve Outcome: Not Met (add Reason)   Problem: Coping: Goal: Ability to verbalize frustrations and anger appropriately will improve Outcome: Not Met (add Reason) Goal: Ability to demonstrate self-control will improve Outcome: Not Met (add Reason)   Problem: Coping: Goal: Ability to verbalize frustrations and anger appropriately will improve Outcome: Not Met (add Reason) Goal: Ability to demonstrate self-control will improve Outcome: Not Met (add Reason)   Problem: Safety: Goal: Periods of time without injury will increase Outcome: Not Met (add Reason)

## 2023-10-06 NOTE — Group Note (Signed)
 Date:  10/06/2023 Time:  6:09 PM  Group Topic/Focus:  Coping With Mental Health Crisis:   The purpose of this group is to help patients identify strategies for coping with mental health crisis.  Group discusses possible causes of crisis and ways to manage them effectively. Healthy Communication:   The focus of this group is to discuss communication, barriers to communication, as well as healthy ways to communicate with others. Overcoming Stress:   The focus of this group is to define stress and help patients assess their triggers.    Participation Level:  Active  Participation Quality:  Appropriate  Affect:  Flat  Cognitive:  Appropriate  Insight: Appropriate  Engagement in Group:  Engaged  Modes of Intervention:  Discussion  Additional Comments:    Najai Waszak L Jensen [place] 10/06/2023, 6:09 PM

## 2023-10-06 NOTE — Plan of Care (Signed)

## 2023-10-06 NOTE — Group Note (Signed)
 Date:  10/06/2023 Time:  6:14 PM  Group Topic/Focus:  Coping With Mental Health Crisis:   The purpose of this group is to help patients identify strategies for coping with mental health crisis.  Group discusses possible causes of crisis and ways to manage them effectively. Self Care:   The focus of this group is to help patients understand the importance of self-care in order to improve or restore emotional, physical, spiritual, interpersonal, and financial health.    Participation Level:  Active  Participation Quality:  Appropriate  Affect:  Appropriate  Cognitive:  Appropriate  Insight: Appropriate  Engagement in Group:  Engaged  Modes of Intervention:  Activity  Additional Comments:    Jernard Reiber L Tawsha Terrero 10/06/2023, 6:14 PM

## 2023-10-07 DIAGNOSIS — F29 Unspecified psychosis not due to a substance or known physiological condition: Secondary | ICD-10-CM | POA: Diagnosis not present

## 2023-10-07 NOTE — Plan of Care (Signed)
  Problem: Education: Goal: Emotional status will improve Outcome: Progressing Goal: Mental status will improve Outcome: Progressing Goal: Verbalization of understanding the information provided will improve Outcome: Progressing   Problem: Coping: Goal: Ability to verbalize frustrations and anger appropriately will improve Outcome: Progressing Goal: Ability to demonstrate self-control will improve Outcome: Progressing   Problem: Health Behavior/Discharge Planning: Goal: Compliance with treatment plan for underlying cause of condition will improve Outcome: Progressing   Problem: Activity: Goal: Interest or engagement in activities will improve Outcome: Not Progressing Note: Only comes out of his room for meals. Reinforcement education and encouragement provided Goal: Sleeping patterns will improve Outcome: Not Progressing Note: Sleeps all day

## 2023-10-07 NOTE — Progress Notes (Signed)
   10/07/23 0943  Psych Admission Type (Psych Patients Only)  Admission Status Involuntary  Psychosocial Assessment  Patient Complaints None  Eye Contact Brief  Facial Expression Flat  Affect Blunted  Speech Slow  Interaction Minimal  Motor Activity Slow  Appearance/Hygiene Disheveled  Behavior Characteristics Cooperative  Mood Pleasant  Thought Process  Coherency WDL  Content WDL  Delusions None reported or observed  Perception WDL  Hallucination None reported or observed  Judgment WDL  Confusion WDL  Danger to Self  Current suicidal ideation? Denies  Danger to Others  Danger to Others None reported or observed

## 2023-10-07 NOTE — Progress Notes (Signed)
   10/06/23 2000  Psych Admission Type (Psych Patients Only)  Admission Status Involuntary  Psychosocial Assessment  Patient Complaints Anxiety  Eye Contact Fair  Facial Expression Flat  Affect Preoccupied  Speech Soft;Slow  Interaction Evasive;No initiation;Minimal  Motor Activity Pacing;Slow  Appearance/Hygiene Improved  Behavior Characteristics Cooperative;Appropriate to situation  Mood Pleasant;Preoccupied  Aggressive Behavior  Effect No apparent injury  Thought Process  Coherency Disorganized;Tangential;Loose associations  Content Preoccupation  Delusions None reported or observed  Perception WDL  Hallucination None reported or observed  Judgment WDL  Confusion WDL  Danger to Self  Current suicidal ideation? Denies  Danger to Others  Danger to Others None reported or observed   Patient alert and oriented x 2, affect is flat but brightens upon approach, he appears responding to internal stimuli but he denies SI/H//AVH, he was noted pacing on the unit, minimal interaction with peers and staff. Patient's thoughts are organized , affect is congruent with his labile mood. No distress noted , 15 minutes safety checks maintained will continue to monitor.

## 2023-10-07 NOTE — Progress Notes (Signed)
 [facility] MD Progress Note  10/07/2023 2:14 PM Roger [name]  MRN:  [id number]  The patient is a 33 year old man with chart history of bipolar vs schizoaffective disorder; hx of polysubstance abuse, depression; he presented to ED after, reportedly, his grandmother contacted crisis services due to reported concerns re: patient responding to internal stimuli, not bathing, trespassing on church property, and digging a large hole in the backyard last week with unclear intent. Grandmother has expressed concerns with patient being discharged and, per chart, she is pursuing legal guardianship for patient via APS with hope for long-term placement. Patient was admitted to [facility] unit for psychosis . On exam today patient is disengaged and internally preoccupied.  He requires frequent redirection and is easily redirectable.  He is alert and oriented to location and self.  Indicates he does not know why he is in the hospital when questioned specifically about taking the whole he indicates he wanted to take a swim and hide from the world.  He denies SI HI and AVH however he is observed responding to internal stimuli and has discussion with self during the interview.  He is a poor historian and is unable to meaningfully fully participate in the interview given his internal preoccupations.  He denied substance use UDS was positive for THC.  He denied previous mental health admissions however they are found on chart review.  He endorses some paranoia but does not elaborate.  Per chart review there is a history of prior suicide attempts.  Given current psychosis patient requires ongoing inpatient psychiatric hospitalization for medication management and stabilization due to impaired judgment, danger to self, and danger to others.   Subjective:  Chart reviewed, case discussed in multidisciplinary meeting, patient seen during rounds.   6/22: Continue with bizarre and disorganized behavior.  Zyprexa  was titrated 10 mg twice  daily they are more somnolent today.  Will continue at current dose.  On interview today they are in bed however they are later observed walking around the unit.  They continue to appear to be responding to internal stimuli at times.  They continue to note paranoia.  He continued to be unable to discuss reason for their admission.  They deny thoughts of harming himself or others.  They utilize as needed trazodone  and Atarax  overnight.  They utilized as needed Tylenol  as well.  They note stable appetite and sleep.  They deny adverse effects of medication.  They voiced no concerns or complaints.  6/21: Continue with bizarre and disorganized behavior.  They continue to be pleasant and cooperative on exam.  Today they are unable to note where they are at or why they are hospitalized.  They continue to be observed responding to internal stimuli.  They continue to note some paranoia.  They deny thoughts of harming himself or others.  They do not require PRNs overnight.  Will increase Zyprexa  to 10 mg twice daily given ongoing psychosis.  6/20: Patient seen today for follow-up they continue with bizarre behavior still somewhat more linear today.  Maintains eye contact during conversation continues to exhibit paranoia continues to have brief conversations.  Does not meaningfully participate in exam.  Utilize as needed trazodone  overnight.  6/19: Patient seen for follow up they continue with bizarre behavior. Is more linear today able to converse today. There are moments where they appear internally preoccupied but they are not observed responding to internal stimuli.  He maintains eye contact there is some paranoid continued about the grandmother he continues  to be guarded about his hallucinations.  He struggles to discuss the hole in the ground and questions was that the size of a body he again does not express intent on what he was doing with the hole.  He denies adverse effects of medications and voices no concerns  or complaints of at this time he required as needed medications overnight for sleep and anxiety trazodone  50 mg and Atarax  25 mg.Continue current course.    Sleep: Fair  Appetite:  Fair  Past Psychiatric History: see h&P Family History:  Family History  Problem Relation Age of Onset   Alcohol abuse Father    Social History:  Social History   Substance and Sexual Activity  Alcohol Use Yes   Alcohol/week: 1.0 standard drink of alcohol   Types: 1 Shots of liquor per week     Social History   Substance and Sexual Activity  Drug Use Yes   Types: Amphetamines, Cocaine, Marijuana, Benzodiazepines, MDMA (Ecstacy), Hydrocodone   Comment: only weed currently    Social History   Socioeconomic History   Marital status: Single    Spouse name: Not on file   Number of children: Not on file   Years of education: Not on file   Highest education level: Not on file  Occupational History   Not on file  Tobacco Use   Smoking status: Former    Current packs/day: 0.50    Average packs/day: 0.5 packs/day for 10.0 years (5.0 ttl pk-yrs)    Types: Cigarettes   Smokeless tobacco: Former    Quit date: 11/24/2014  Vaping Use   Vaping status: Never Used  Substance and Sexual Activity   Alcohol use: Yes    Alcohol/week: 1.0 standard drink of alcohol    Types: 1 Shots of liquor per week   Drug use: Yes    Types: Amphetamines, Cocaine, Marijuana, Benzodiazepines, MDMA (Ecstacy), Hydrocodone    Comment: only weed currently   Sexual activity: Yes    Birth control/protection: None  Other Topics Concern   Not on file  Social History Narrative   Not on file   Social Drivers of Health   Financial Resource Strain: Not on file  Food Insecurity: No Food Insecurity (10/02/2023)   Hunger Vital Sign    Worried About Running Out of Food in the Last Year: Never true    Ran Out of Food in the Last Year: Never true  Transportation Needs: No Transportation Needs (10/02/2023)   PRAPARE - Therapist, art (Medical): No    Lack of Transportation (Non-Medical): No  Physical Activity: Not on file  Stress: Not on file  Social Connections: Not on file   Past Medical History:  Past Medical History:  Diagnosis Date   Depression    Headache    Substance abuse (HCC)     Past Surgical History:  Procedure Laterality Date   FEMUR FRACTURE SURGERY      Current Medications: Current Facility-Administered Medications  Medication Dose Route Frequency Provider Last Rate Last Admin   acetaminophen  (TYLENOL ) tablet 650 mg  650 mg Oral Q6H PRN Mardy Legacy, NP   650 mg at 10/07/23 0338   alum & mag hydroxide-simeth (MAALOX/MYLANTA) 200-200-20 MG/5ML suspension 30 mL  30 mL Oral Q4H PRN Mardy Legacy, NP       benztropine  (COGENTIN ) tablet 1 mg  1 mg Oral BID PRN Mardy Legacy, NP       Or   benztropine  mesylate (COGENTIN ) injection 1  mg  1 mg Intramuscular BID PRN Mardy Legacy, NP       hydrOXYzine  (ATARAX ) tablet 25 mg  25 mg Oral TID PRN Mardy Legacy, NP   25 mg at 10/06/23 2134   magnesium  hydroxide (MILK OF MAGNESIA) suspension 30 mL  30 mL Oral Daily PRN Mardy Legacy, NP       OLANZapine  (ZYPREXA ) injection 10 mg  10 mg Intramuscular TID PRN Mardy Legacy, NP       OLANZapine  (ZYPREXA ) injection 5 mg  5 mg Intramuscular TID PRN Mardy Legacy, NP       OLANZapine  (ZYPREXA ) tablet 10 mg  10 mg Oral BID Kyliyah Stirn E, PA-C   10 mg at 10/07/23 1100   OLANZapine  zydis (ZYPREXA ) disintegrating tablet 5 mg  5 mg Oral TID PRN Mardy Legacy, NP       traZODone  (DESYREL ) tablet 50 mg  50 mg Oral QHS PRN Corgan Mormile E, PA-C   50 mg at 10/06/23 2133    Lab Results:  No results found for this or any previous visit (from the past 48 hours).   Blood Alcohol level:  Lab Results  Component Value Date   [facility] <15 10/01/2023   ETH <10 01/18/2023    Metabolic Disorder Labs: Lab Results  Component Value Date   HGBA1C 5.6 10/05/2023    MPG 114.02 10/05/2023   MPG 123 04/05/2021   Lab Results  Component Value Date   PROLACTIN 25.5 (H) 12/25/2015   Lab Results  Component Value Date   CHOL 191 10/05/2023   TRIG 193 (H) 10/05/2023   HDL 49 10/05/2023   CHOLHDL 3.9 10/05/2023   VLDL 39 10/05/2023   LDLCALC 103 (H) 10/05/2023   LDLCALC 86 04/05/2021    Physical Findings: AIMS:  , ,  ,  ,    CIWA:    COWS:      Psychiatric Specialty Exam:  Presentation  General Appearance:  Disheveled  Eye Contact: Fleeting  Speech: Normal Rate  Speech Volume: Normal    Mood and Affect  Mood: Euthymic  Affect: Blunt   Thought Process  Thought Processes: -- (concrete)  Descriptions of Associations:Loose  Orientation:Partial  Thought Content:Illogical; Paranoid Ideation  Hallucinations:Hallucinations: -- (pt denies, appears internally preoccupied)   Ideas of Reference:Paranoia  Suicidal Thoughts:Suicidal Thoughts: No   Homicidal Thoughts:Homicidal Thoughts: No    Sensorium  Memory: Immediate Poor; Recent Poor  Judgment: Impaired  Insight: Lacking   Executive Functions  Concentration: Fair  Attention Span: Poor  Recall: [employer] of Knowledge: Fair  Language: Fair   Psychomotor Activity  Psychomotor Activity: Psychomotor Activity: Normal   Musculoskeletal: Strength & Muscle Tone: within normal limits Gait & Station: normal Assets  Assets: Social Support    Physical Exam: Physical Exam Vitals and nursing note reviewed.  HENT:     Head: Atraumatic.   Eyes:     Extraocular Movements: Extraocular movements intact.   Pulmonary:     Effort: Pulmonary effort is normal.   Neurological:     Mental Status: He is alert.    Review of Systems  Psychiatric/Behavioral:  Positive for hallucinations. Negative for depression, substance abuse and suicidal ideas. The patient is not nervous/anxious and does not have insomnia.    Blood pressure 112/75, pulse 61,  temperature 97.7 F (36.5 C), resp. rate 16, height 5' 6 (1.676 m), weight 56.2 kg, SpO2 100%. Body mass index is 20.01 kg/m.  Diagnosis: Principal Problem:   Psychosis (HCC)   PLAN: Safety  and Monitoring:  -- Voluntary admission to inpatient psychiatric unit for safety, stabilization and treatment  -- Daily contact with patient to assess and evaluate symptoms and progress in treatment  -- Patient's case to be discussed in multi-disciplinary team meeting  -- Observation Level : q15 minute checks  -- Vital signs:  q12 hours  -- Precautions: suicide, elopement, and assault -- Encouraged patient to participate in unit milieu and in scheduled group therapies  2. Psychiatric Diagnoses and Treatment:  Schizoaffective vs bipolar disorder.             - pt with acute psychosis with hallucinations, disorganization, and self care deficit. Pt unable to fully participate in exam given internal preoccupation. Patient continues to require inpatient psychiatric hospitalization due to severely impaired judgement, danger to self, and others.   6/19: Continues with psychosis some improvement. Continues to require inpatient hospitalization. Will continue current dose of zyprexa .              - Continue  zyprexa  to 15 mg nightly, Started 10/03/23   6/20: Continues with psychosis will continue zyprexa  consider adding abilify if no improvement tomorrow.   6/21: increase zyprexa  to 10 mg BID  6/22: Continue Zyprexa  10 mg twice daily  -- The risks/benefits/side-effects/alternatives to this medication were discussed in detail with the patient and time was given for questions. The patient consents to medication trial.                -- Metabolic profile and EKG monitoring obtained while on an atypical antipsychotic (BMI: Lipid Panel: HbgA1c: QTc:394)             -- Encouraged patient to participate in unit milieu and in scheduled group therapies                            3. Medical Issues Being Addressed:    no acute concerns   4. Discharge Planning:              -- Social work and case management to assist with discharge planning and identification of hospital follow-up needs prior to discharge             -- Estimated LOS: 5-7 days             -- Discharge Concerns: Need to establish a safety plan; Medication compliance and effectiveness             -- Discharge Goals: Return home with outpatient referrals follow ups Donnice FORBES Right, PA-C 10/07/2023, 2:14 PM

## 2023-10-07 NOTE — Progress Notes (Signed)
 10/07/2023 Letter to the court  Reg: Kj E.Benett DOB: [birth year]  To whom it concerns,  [name] has been admitted to [facility] since 10/02/23. Roger [name] is a 33 year old man with  history of schizoaffective disorder, bipolar type. He is admitted for responding to internal stimuli, not bathing, trespassing on church property, and digging a large hole in the backyard last week with unclear intent. Since admission patient is displaying paranoid behaviors, lack of insight into his mental health problems.   Roger [name] needs further treatment to ensure his paranoia is resolving and he is responding to the treatment with no side effects.  As this process is time consuming we request the court to grant us  with extension of IVC up to 30 days.  Sincerely, Roger [name].MD [facility]

## 2023-10-07 NOTE — Plan of Care (Signed)
  Problem: Education: Goal: Knowledge of [facility] Education information/materials will improve Outcome: Progressing   Problem: Education: Goal: Emotional status will improve Outcome: Progressing   Problem: Education: Goal: Mental status will improve Outcome: Progressing   Problem: Education: Goal: Verbalization of understanding the information provided will improve Outcome: Progressing   

## 2023-10-08 MED ORDER — OLANZAPINE 5 MG PO TABS
15.0000 mg | ORAL_TABLET | Freq: Two times a day (BID) | ORAL | Status: DC
  Administered 2023-10-08 – 2023-10-12 (×8): 15 mg via ORAL
  Filled 2023-10-08 (×8): qty 1

## 2023-10-08 NOTE — Plan of Care (Signed)
  Problem: Education: Goal: Verbalization of understanding the information provided will improve Outcome: Progressing   Problem: Education: Goal: Mental status will improve Outcome: Progressing   Problem: Activity: Goal: Interest or engagement in activities will improve Outcome: Progressing   Problem: Activity: Goal: Sleeping patterns will improve Outcome: Progressing

## 2023-10-08 NOTE — Plan of Care (Addendum)
  Problem: Education: Goal: Emotional status will improve Outcome: Progressing Goal: Mental status will improve Outcome: Progressing Goal: Verbalization of understanding the information provided will improve Outcome: Progressing   Problem: Activity: Goal: Interest or engagement in activities will improve Outcome: Progressing Goal: Sleeping patterns will improve Outcome: Progressing   Problem: Coping: Goal: Ability to verbalize frustrations and anger appropriately will improve Outcome: Progressing Goal: Ability to demonstrate self-control will improve Outcome: Progressing   Problem: Health Behavior/Discharge Planning: Goal: Compliance with treatment plan for underlying cause of condition will improve Outcome: Progressing   Patient alert and oriented. Denies SI, HI, AVH and pain at this time. Scheduled medications administered per MAR. Support and encouragement provided. No adverse drug reactions noted.   Routine safety checks conducted every 15 minutes.    Patient verbally contracts for safety at this time. Patient remains safe at this time.

## 2023-10-08 NOTE — Group Note (Signed)
 Recreation Therapy Group Note   Group Topic:Health and Wellness  Group Date: 10/08/2023 Start Time: 1045 End Time: 1135 Facilitators: Celestia Jeoffrey BRAVO, LRT, CTRS Location: Courtyard  Group Description: [employer]. LRT and patients played games of basketball, drew with chalk, and played corn hole while outside in the courtyard while getting fresh air and sunlight. Music was being played in the background. LRT and peers conversed about different games they have played before, what they do in their free time and anything else that is on their minds. LRT encouraged pts to drink water after being outside, sweating and getting their heart rate up.  Goal Area(s) Addressed: Patient will build on frustration tolerance skills. Patients will partake in a competitive play game with peers. Patients will gain knowledge of new leisure interest/hobby.    Affect/Mood: N/A   Participation Level: Did not attend    Clinical Observations/Individualized Feedback: Patient did not attend group.   Plan: Continue to engage patient in RT group sessions 2-3x/week.   [address] LRT, CTRS 10/08/2023 1:42 PM

## 2023-10-08 NOTE — Progress Notes (Signed)
   10/08/23 1006  Psych Admission Type (Psych Patients Only)  Admission Status Involuntary  Psychosocial Assessment  Patient Complaints None  Eye Contact Brief  Facial Expression Animated  Affect Blunted  Speech Slow  Interaction Minimal  Motor Activity Slow  Appearance/Hygiene Improved  Behavior Characteristics Cooperative  Mood Pleasant  Thought Process  Coherency WDL  Content WDL  Delusions None reported or observed  Perception WDL  Hallucination None reported or observed  Judgment WDL  Confusion WDL  Danger to Self  Current suicidal ideation? Denies  Danger to Others  Danger to Others None reported or observed

## 2023-10-08 NOTE — BH IP Treatment Plan (Signed)
 Interdisciplinary Treatment and Diagnostic Plan Update  10/08/2023 Time of Session: 2:00PM Roger [name] MRN: [id number]  Principal Diagnosis: Psychosis [facility])  Secondary Diagnoses: Principal Problem:   Psychosis (HCC)   Current Medications:  Current Facility-Administered Medications  Medication Dose Route Frequency Provider Last Rate Last Admin   acetaminophen  (TYLENOL ) tablet 650 mg  650 mg Oral Q6H PRN Mardy Legacy, NP   650 mg at 10/07/23 0338   alum & mag hydroxide-simeth (MAALOX/MYLANTA) 200-200-20 MG/5ML suspension 30 mL  30 mL Oral Q4H PRN Mardy Legacy, NP       benztropine  (COGENTIN ) tablet 1 mg  1 mg Oral BID PRN Mardy Legacy, NP       Or   benztropine  mesylate (COGENTIN ) injection 1 mg  1 mg Intramuscular BID PRN Mardy Legacy, NP       hydrOXYzine  (ATARAX ) tablet 25 mg  25 mg Oral TID PRN Mardy Legacy, NP   25 mg at 10/06/23 2134   magnesium  hydroxide (MILK OF MAGNESIA) suspension 30 mL  30 mL Oral Daily PRN Mardy Legacy, NP       OLANZapine  (ZYPREXA ) injection 10 mg  10 mg Intramuscular TID PRN Mardy Legacy, NP       OLANZapine  (ZYPREXA ) injection 5 mg  5 mg Intramuscular TID PRN Mardy Legacy, NP       OLANZapine  (ZYPREXA ) tablet 10 mg  10 mg Oral BID Millington, Matthew E, PA-C   10 mg at 10/08/23 9076   OLANZapine  zydis (ZYPREXA ) disintegrating tablet 5 mg  5 mg Oral TID PRN Mardy Legacy, NP       traZODone  (DESYREL ) tablet 50 mg  50 mg Oral QHS PRN Millington, Matthew E, PA-C   50 mg at 10/07/23 2136   PTA Medications: No medications prior to admission.    Patient Stressors: Financial difficulties   Medication change or noncompliance   Substance abuse    Patient Strengths: Physical Health   Treatment Modalities: Medication Management, Group therapy, Case management,  1 to 1 session with clinician, Psychoeducation, Recreational therapy.   Physician Treatment Plan for Primary Diagnosis: Psychosis (HCC) Long Term  Goal(s): Improvement in symptoms so as ready for discharge   Short Term Goals: Ability to identify changes in lifestyle to reduce recurrence of condition will improve Ability to verbalize feelings will improve Ability to maintain clinical measurements within normal limits will improve Compliance with prescribed medications will improve  Medication Management: Evaluate patient's response, side effects, and tolerance of medication regimen.  Therapeutic Interventions: 1 to 1 sessions, Unit Group sessions and Medication administration.  Evaluation of Outcomes: Not Progressing  Physician Treatment Plan for Secondary Diagnosis: Principal Problem:   Psychosis (HCC)  Long Term Goal(s): Improvement in symptoms so as ready for discharge   Short Term Goals: Ability to identify changes in lifestyle to reduce recurrence of condition will improve Ability to verbalize feelings will improve Ability to maintain clinical measurements within normal limits will improve Compliance with prescribed medications will improve     Medication Management: Evaluate patient's response, side effects, and tolerance of medication regimen.  Therapeutic Interventions: 1 to 1 sessions, Unit Group sessions and Medication administration.  Evaluation of Outcomes: Not Progressing   RN Treatment Plan for Primary Diagnosis: Psychosis (HCC) Long Term Goal(s): Knowledge of disease and therapeutic regimen to maintain health will improve  Short Term Goals: Ability to demonstrate self-control, Ability to participate in decision making will improve, Ability to verbalize feelings will improve, Ability to disclose and discuss suicidal ideas, Ability to identify  and develop effective coping behaviors will improve, and Compliance with prescribed medications will improve  Medication Management: RN will administer medications as ordered by provider, will assess and evaluate patient's response and provide education to patient for  prescribed medication. RN will report any adverse and/or side effects to prescribing provider.  Therapeutic Interventions: 1 on 1 counseling sessions, Psychoeducation, Medication administration, Evaluate responses to treatment, Monitor vital signs and CBGs as ordered, Perform/monitor CIWA, COWS, AIMS and Fall Risk screenings as ordered, Perform wound care treatments as ordered.  Evaluation of Outcomes: Not Progressing   LCSW Treatment Plan for Primary Diagnosis: Psychosis (HCC) Long Term Goal(s): Safe transition to appropriate next level of care at discharge, Engage patient in therapeutic group addressing interpersonal concerns.  Short Term Goals: Engage patient in aftercare planning with referrals and resources, Increase social support, Increase ability to appropriately verbalize feelings, Increase emotional regulation, Facilitate acceptance of mental health diagnosis and concerns, Facilitate patient progression through stages of change regarding substance use diagnoses and concerns, Identify triggers associated with mental health/substance abuse issues, and Increase skills for wellness and recovery  Therapeutic Interventions: Assess for all discharge needs, 1 to 1 time with Social worker, Explore available resources and support systems, Assess for adequacy in community support network, Educate family and significant other(s) on suicide prevention, Complete Psychosocial Assessment, Interpersonal group therapy.  Evaluation of Outcomes: Not Progressing   Progress in Treatment: Attending groups: No. Participating in groups: No. Taking medication as prescribed: Yes. Toleration medication: Yes. Family/Significant other contact made: No, will contact:  once permission has been granted.  Patient understands diagnosis: No. Discussing patient identified problems/goals with staff: Yes. Medical problems stabilized or resolved: Yes. Denies suicidal/homicidal ideation: Yes. Issues/concerns per patient  self-inventory: No. Other: none  New problem(s) identified: No, Describe:  none identified.  Update 10/08/2023:  No changes at this time.    New Short Term/Long Term Goal(s): elimination of symptoms of psychosis, medication management for mood stabilization; elimination of SI thoughts; development of comprehensive mental wellness/sobriety plan.  Update 10/08/2023:  No changes at this time.     Patient Goals:  Pt denied any goals for treatment.  Update 10/08/2023:  No changes at this time.    Discharge Plan or Barriers: CSW will assist pt with development of an appropriate aftercare/discharge plan. Update 10/08/2023:  Patient's grandmother has reported that the patient can not return to her home.  Unclear at this time where the patient will be discharged too.    Reason for Continuation of Hospitalization: Delusions  Hallucinations Medication stabilization   Estimated Length of Stay: 1-7 days  Update 10/08/2023:  TBD  Last 3 [place] Suicide Severity Risk Score: Flowsheet Row Admission (Current) from 10/02/2023 in [facility]ICINE ED from 10/01/2023 in [facility] Emergency Department at [facility] ED from 01/18/2023 in [facility] Emergency Department at [facility]  C-SSRS RISK CATEGORY No Risk No Risk No Risk    Last PHQ 2/9 Scores:     No data to display          Scribe for Treatment Team: Sherryle JINNY Margo, KEN 10/08/2023 3:40 PM

## 2023-10-08 NOTE — Progress Notes (Signed)
 [facility] MD Progress Note  10/09/2023 12:26 PM Roger [name]  MRN:  [id number]  The patient is a 33 year old man with chart history of bipolar vs schizoaffective disorder; hx of polysubstance abuse, depression; he presented to ED after, reportedly, his grandmother contacted crisis services due to reported concerns re: patient responding to internal stimuli, not bathing, trespassing on church property, and digging a large hole in the backyard last week with unclear intent. Grandmother has expressed concerns with patient being discharged and, per chart, she is pursuing legal guardianship for patient via APS with hope for long-term placement. Patient was admitted to [facility] unit for psychosis . On exam today patient is disengaged and internally preoccupied.  He requires frequent redirection and is easily redirectable.  He is alert and oriented to location and self.  Indicates he does not know why he is in the hospital when questioned specifically about taking the whole he indicates he wanted to take a swim and hide from the world.  He denies SI HI and AVH however he is observed responding to internal stimuli and has discussion with self during the interview.  He is a poor historian and is unable to meaningfully fully participate in the interview given his internal preoccupations.  He denied substance use UDS was positive for THC.  He denied previous mental health admissions however they are found on chart review.  He endorses some paranoia but does not elaborate.  Per chart review there is a history of prior suicide attempts.  Given current psychosis patient requires ongoing inpatient psychiatric hospitalization for medication management and stabilization due to impaired judgment, danger to self, and danger to others.   Subjective:  Chart reviewed, case discussed in multidisciplinary meeting, patient seen during rounds.   Patient seen today for follow-up psychiatric evaluation. Upon approach, patient is noted  to be lying in bed, presentation is bizarre, disorganized, and internally preoccupied.  Patient reports "I do not know honestly" when prompted to review the events and symptoms leading to his admission. He is unable to recall specific details but vaguely references "something about a dug a hole in the backyard," though he cannot explain the behavior. When assessed for hallucinations, mood, delusions, or suicidal/homicidal ideation, he responds to all questions with "I do not know." Overall insight remains very poor, and the patient is unable to meaningfully participate in the interview at this time.  Mental Status Exam: Appearance disheveled, behavior disorganized and internally preoccupied. Speech sparse and vague. Mood unable to assess, affect blunted. Thought process disorganized. Thought content unclear due to poor engagement. Perceptual disturbances suspected given internal preoccupation, but not clearly endorsed. Insight and judgment poor. Cognition grossly impaired due to disorganization.  Denies medication side effects and there are none noted. Medication education provided to include risks, benefits, and side effects. Patient remains appropriate for the inpatient psychiatric setting for safety, medication management, and symptom stabilization.  Zyprexa  dose increased to better target persistent psychotic symptoms. Goal is to optimize monotherapy given minimal clinical response thus far.   Sleep: Fair  Appetite:  Fair  Past Psychiatric History: see h&P Family History:  Family History  Problem Relation Age of Onset   Alcohol abuse Father    Social History:  Social History   Substance and Sexual Activity  Alcohol Use Yes   Alcohol/week: 1.0 standard drink of alcohol   Types: 1 Shots of liquor per week     Social History   Substance and Sexual Activity  Drug Use Yes  Types: Amphetamines, Cocaine, Marijuana, Benzodiazepines, MDMA (Ecstacy), Hydrocodone   Comment: only weed  currently    Social History   Socioeconomic History   Marital status: Single    Spouse name: Not on file   Number of children: Not on file   Years of education: Not on file   Highest education level: Not on file  Occupational History   Not on file  Tobacco Use   Smoking status: Former    Current packs/day: 0.50    Average packs/day: 0.5 packs/day for 10.0 years (5.0 ttl pk-yrs)    Types: Cigarettes   Smokeless tobacco: Former    Quit date: 11/24/2014  Vaping Use   Vaping status: Never Used  Substance and Sexual Activity   Alcohol use: Yes    Alcohol/week: 1.0 standard drink of alcohol    Types: 1 Shots of liquor per week   Drug use: Yes    Types: Amphetamines, Cocaine, Marijuana, Benzodiazepines, MDMA (Ecstacy), Hydrocodone    Comment: only weed currently   Sexual activity: Yes    Birth control/protection: None  Other Topics Concern   Not on file  Social History Narrative   Not on file   Social Drivers of Health   Financial Resource Strain: Not on file  Food Insecurity: No Food Insecurity (10/02/2023)   Hunger Vital Sign    Worried About Running Out of Food in the Last Year: Never true    Ran Out of Food in the Last Year: Never true  Transportation Needs: No Transportation Needs (10/02/2023)   PRAPARE - Administrator, Civil Service (Medical): No    Lack of Transportation (Non-Medical): No  Physical Activity: Not on file  Stress: Not on file  Social Connections: Not on file   Past Medical History:  Past Medical History:  Diagnosis Date   Depression    Headache    Substance abuse (HCC)     Past Surgical History:  Procedure Laterality Date   FEMUR FRACTURE SURGERY      Current Medications: Current Facility-Administered Medications  Medication Dose Route Frequency Provider Last Rate Last Admin   acetaminophen  (TYLENOL ) tablet 650 mg  650 mg Oral Q6H PRN Mardy Legacy, NP   650 mg at 10/07/23 0338   alum & mag hydroxide-simeth (MAALOX/MYLANTA)  200-200-20 MG/5ML suspension 30 mL  30 mL Oral Q4H PRN Mardy Legacy, NP       benztropine  (COGENTIN ) tablet 1 mg  1 mg Oral BID PRN Mardy Legacy, NP       Or   benztropine  mesylate (COGENTIN ) injection 1 mg  1 mg Intramuscular BID PRN Mardy Legacy, NP       hydrOXYzine  (ATARAX ) tablet 25 mg  25 mg Oral TID PRN Mardy Legacy, NP   25 mg at 10/06/23 2134   magnesium  hydroxide (MILK OF MAGNESIA) suspension 30 mL  30 mL Oral Daily PRN Mardy Legacy, NP       OLANZapine  (ZYPREXA ) injection 10 mg  10 mg Intramuscular TID PRN Mardy Legacy, NP       OLANZapine  (ZYPREXA ) injection 5 mg  5 mg Intramuscular TID PRN Mardy Legacy, NP       OLANZapine  (ZYPREXA ) tablet 15 mg  15 mg Oral BID Cleotilde Hoy HERO, NP   15 mg at 10/09/23 9196   OLANZapine  zydis (ZYPREXA ) disintegrating tablet 5 mg  5 mg Oral TID PRN Mardy Legacy, NP       traZODone  (DESYREL ) tablet 50 mg  50 mg Oral QHS PRN Millington,  Donnice BRAVO, PA-C   50 mg at 10/08/23 2137    Lab Results:  No results found for this or any previous visit (from the past 48 hours).   Blood Alcohol level:  Lab Results  Component Value Date   [facility] <15 10/01/2023   ETH <10 01/18/2023    Metabolic Disorder Labs: Lab Results  Component Value Date   HGBA1C 5.6 10/05/2023   MPG 114.02 10/05/2023   MPG 123 04/05/2021   Lab Results  Component Value Date   PROLACTIN 25.5 (H) 12/25/2015   Lab Results  Component Value Date   CHOL 191 10/05/2023   TRIG 193 (H) 10/05/2023   HDL 49 10/05/2023   CHOLHDL 3.9 10/05/2023   VLDL 39 10/05/2023   LDLCALC 103 (H) 10/05/2023   LDLCALC 86 04/05/2021    Physical Findings: AIMS:  , ,  ,  ,    CIWA:    COWS:      Psychiatric Specialty Exam:  Presentation  General Appearance:  Bizarre; Disheveled  Eye Contact: Poor  Speech: Slow  Speech Volume: Decreased    Mood and Affect  Mood: Depressed  Affect: Constricted   Thought Process  Thought Processes: --  (minimal vague unable to participate in assessment)  Descriptions of Associations:-- (unable to assess related to poor engagement)  Orientation:Full (Time, Place and Person)  Thought Content:Illogical  Hallucinations:Hallucinations: -- (states I don't know)   Ideas of Reference:-- (states I don't know poor engagement)  Suicidal Thoughts:No data recorded   Homicidal Thoughts:No data recorded    Sensorium  Memory: Immediate Poor; Remote Poor  Judgment: Poor  Insight: Poor   Executive Functions  Concentration: Poor  Attention Span: Poor  Recall: [employer] of Knowledge: Fair  Language: Fair   Psychomotor Activity  Psychomotor Activity: Psychomotor Activity: Normal   Musculoskeletal: Strength & Muscle Tone: within normal limits Gait & Station: normal Assets  Assets: Social Support    Physical Exam: Physical Exam Vitals and nursing note reviewed.  HENT:     Head: Atraumatic.   Eyes:     Extraocular Movements: Extraocular movements intact.   Pulmonary:     Effort: Pulmonary effort is normal.   Neurological:     Mental Status: He is alert.    Review of Systems  Psychiatric/Behavioral:  Positive for hallucinations. Negative for depression, substance abuse and suicidal ideas. The patient is not nervous/anxious and does not have insomnia.    Blood pressure 108/69, pulse (!) 56, temperature 98 F (36.7 C), resp. rate 16, height 5' 6 (1.676 m), weight 56.2 kg, SpO2 98%. Body mass index is 20.01 kg/m.  Diagnosis: Principal Problem:   Psychosis (HCC)   PLAN: Safety and Monitoring:  -- Voluntary admission to inpatient psychiatric unit for safety, stabilization and treatment  -- Daily contact with patient to assess and evaluate symptoms and progress in treatment  -- Patient's case to be discussed in multi-disciplinary team meeting  -- Observation Level : q15 minute checks  -- Vital signs:  q12 hours  -- Precautions: suicide,  elopement, and assault -- Encouraged patient to participate in unit milieu and in scheduled group therapies  2. Psychiatric Diagnoses and Treatment:  Schizoaffective vs bipolar disorder.              - pt with acute psychosis with hallucinations, disorganization, and self care deficit. Pt unable to fully participate in exam given internal preoccupation. Patient continues to require inpatient psychiatric hospitalization due to severely impaired judgement, danger to self, and  others.    Increase Zyprexa  15 mg twice daily  -- The risks/benefits/side-effects/alternatives to this medication were discussed in detail with the patient and time was given for questions. The patient consents to medication trial.                -- Metabolic profile and EKG monitoring obtained while on an atypical antipsychotic (BMI: Lipid Panel: HbgA1c: QTc:394)             -- Encouraged patient to participate in unit milieu and in scheduled group therapies                            3. Medical Issues Being Addressed:   no acute concerns   4. Discharge Planning:              -- Social work and case management to assist with discharge planning and identification of hospital follow-up needs prior to discharge             -- Estimated LOS: 5-7 days             -- Discharge Concerns: Need to establish a safety plan; Medication compliance and effectiveness             -- Discharge Goals: Return home with outpatient referrals follow ups Hoy CHRISTELLA Pinal, NP 10/09/2023, 12:26 PM

## 2023-10-08 NOTE — Group Note (Signed)
 Date:  10/08/2023 Time:  9:52 AM  Group Topic/Focus:  Personal Choices and Values:   The focus of this group is to help patients assess and explore the importance of values in their lives, how their values affect their decisions, how they express their values and what opposes their expression.    Participation Level:  Did Not Attend  Participation Quality:    Affect:    Cognitive:    Insight:   Engagement in Group:    Modes of Intervention:    Additional Comments:    Helaman Mecca 10/08/2023, 9:52 AM

## 2023-10-08 NOTE — Group Note (Signed)
 Date:  10/08/2023 Time:  9:53 PM  Group Topic/Focus:  Wrap-Up Group:   The focus of this group is to help patients review their daily goal of treatment and discuss progress on daily workbooks.    Participation Level:  Did Not Attend  Participation Quality:  none  Affect:  none  Cognitive:  none  Insight: None  Engagement in Group:  none  Modes of Intervention:  none  Additional Comments:  none   Kerri Katz 10/08/2023, 9:53 PM

## 2023-10-08 NOTE — Progress Notes (Signed)
   10/07/23 2000  Psych Admission Type (Psych Patients Only)  Admission Status Involuntary  Psychosocial Assessment  Patient Complaints None  Eye Contact Fair  Facial Expression Flat  Affect Preoccupied  Speech Soft;Slow  Interaction Evasive;No initiation;Minimal  Motor Activity Pacing;Slow  Appearance/Hygiene Improved  Behavior Characteristics Cooperative;Appropriate to situation  Mood Pleasant  Aggressive Behavior  Effect No apparent injury  Thought Process  Coherency Disorganized;Tangential;Loose associations  Content Preoccupation  Delusions None reported or observed  Perception WDL  Hallucination None reported or observed  Judgment WDL  Confusion WDL  Danger to Self  Current suicidal ideation? Denies  Danger to Others  Danger to Others None reported or observed   Condition unchanged  thoughts are organized, no distress noted interacting with peers and staff, he denies SI/HI/AVH. 15 minutes safety checks maintained will continue to monitor.

## 2023-10-08 NOTE — Group Note (Signed)
 [facility] LCSW Group Therapy Note    Group Date: 10/08/2023 Start Time: 1300 End Time: 1400  Type of Therapy and Topic:  Group Therapy:  Overcoming Obstacles  Participation Level:  [facility] PARTICIPATION LEVEL: Did Not Attend   Description of Group:   In this group patients will be encouraged to explore what they see as obstacles to their own wellness and recovery. They will be guided to discuss their thoughts, feelings, and behaviors related to these obstacles. The group will process together ways to cope with barriers, with attention given to specific choices patients can make. Each patient will be challenged to identify changes they are motivated to make in order to overcome their obstacles. This group will be process-oriented, with patients participating in exploration of their own experiences as well as giving and receiving support and challenge from other group members.  Therapeutic Goals: 1. Patient will identify personal and current obstacles as they relate to admission. 2. Patient will identify barriers that currently interfere with their wellness or overcoming obstacles.  3. Patient will identify feelings, thought process and behaviors related to these barriers. 4. Patient will identify two changes they are willing to make to overcome these obstacles:    Summary of Patient Progress X   Therapeutic Modalities:   Cognitive Behavioral Therapy Solution Focused Therapy Motivational Interviewing Relapse Prevention Therapy   Nadara JONELLE Fam, LCSW

## 2023-10-09 NOTE — Plan of Care (Signed)

## 2023-10-09 NOTE — Group Note (Signed)
 Date:  10/09/2023 Time:  9:04 PM  Group Topic/Focus:  Stages of Change:   The focus of this group is to explain the stages of change and help patients identify changes they want to make upon discharge. Wrap-Up Group:   The focus of this group is to help patients review their daily goal of treatment and discuss progress on daily workbooks.    Participation Level:  Active  Participation Quality:  Appropriate and Attentive  Affect:  Appropriate  Cognitive:  Alert  Insight: Appropriate and Good  Engagement in Group:  Engaged  Modes of Intervention:  Activity, Discussion, and Socialization  Additional Comments:     [name],Roger Paradise E 10/09/2023, 9:04 PM

## 2023-10-09 NOTE — Group Note (Signed)
 Date:  10/09/2023 Time:  2:52 PM  Group Topic/Focus:  Goals Group:   The focus of this group is to help patients establish daily goals to achieve during treatment and discuss how the patient can incorporate goal setting into their daily lives to aide in recovery.    Participation Level:  Did Not Attend  Participation Quality:    Affect:    Cognitive:    Insight:   Engagement in Group:    Modes of Intervention:    Additional Comments:    Roger [name] 10/09/2023, 2:52 PM

## 2023-10-09 NOTE — Group Note (Signed)
 Date:  10/09/2023 Time:  2:26 PM  Group Topic/Focus:  Healthy Communication:   The focus of this group is to discuss communication, barriers to communication, as well as healthy ways to communicate with others.    Participation Level:  Did Not Attend  Participation Quality:    Affect:    Cognitive:    Insight:   Engagement in Group:    Modes of Intervention:    Additional Comments:    Gyan Cambre 10/09/2023, 2:26 PM

## 2023-10-09 NOTE — Progress Notes (Signed)
 [facility] MD Progress Note  10/09/2023 12:32 PM Roger [name]  MRN:  [id number]  The patient is a 33 year old man with chart history of bipolar vs schizoaffective disorder; hx of polysubstance abuse, depression; he presented to ED after, reportedly, his grandmother contacted crisis services due to reported concerns re: patient responding to internal stimuli, not bathing, trespassing on church property, and digging a large hole in the backyard last week with unclear intent. Grandmother has expressed concerns with patient being discharged and, per chart, she is pursuing legal guardianship for patient via APS with hope for long-term placement. Patient was admitted to [facility] unit for psychosis . On exam today patient is disengaged and internally preoccupied.  He requires frequent redirection and is easily redirectable.  He is alert and oriented to location and self.  Indicates he does not know why he is in the hospital when questioned specifically about taking the whole he indicates he wanted to take a swim and hide from the world.  He denies SI HI and AVH however he is observed responding to internal stimuli and has discussion with self during the interview.  He is a poor historian and is unable to meaningfully fully participate in the interview given his internal preoccupations.  He denied substance use UDS was positive for THC.  He denied previous mental health admissions however they are found on chart review.  He endorses some paranoia but does not elaborate.  Per chart review there is a history of prior suicide attempts.  Given current psychosis patient requires ongoing inpatient psychiatric hospitalization for medication management and stabilization due to impaired judgment, danger to self, and danger to others.   Subjective:  Chart reviewed, case discussed in multidisciplinary meeting, patient seen during rounds.   Patient seen today for follow-up psychiatric evaluation. Upon approach, patient is noted  to be lying in bed, presentation disheveled, unkempt, and minimally engaged.  Patient reports continued inability to recall events leading to admission. When asked assessment questions related to mood, hallucinations, delusions, and suicidality, patient consistently responds with, "I don't know," or simply states "note" or "quote" without elaboration. He denies auditory or visual hallucinations but is unable to clearly express or assess his thought content. Overall presentation remains disorganized and internally preoccupied with poor insight and limited participation.  Mental Status Exam: Appearance disheveled and unkempt, behavior withdrawn and minimally responsive. Speech sparse and repetitive. Mood not clearly assessed, affect flat. Thought process disorganized. Thought content unclear due to impaired engagement. No overt delusions or hallucinations reported, though internal preoccupation remains likely. Insight and judgment are poor. Cognition grossly impaired by disorganization.  Denies medication side effects and there are none noted. Medication education provided to include risks, benefits, and side effects. Patient remains appropriate for the inpatient psychiatric setting for safety, medication management, and symptom stabilization.  Zyprexa  increased to 15 mg twice daily as of yesterday to target persistent psychotic symptoms. Will monitor for clinical response over the next several days. If no improvement is observed, a change in antipsychotic regimen will be considered.   Sleep: Fair  Appetite:  Fair  Past Psychiatric History: see h&P Family History:  Family History  Problem Relation Age of Onset   Alcohol abuse Father    Social History:  Social History   Substance and Sexual Activity  Alcohol Use Yes   Alcohol/week: 1.0 standard drink of alcohol   Types: 1 Shots of liquor per week     Social History   Substance and Sexual Activity  Drug  Use Yes   Types: Amphetamines,  Cocaine, Marijuana, Benzodiazepines, MDMA (Ecstacy), Hydrocodone   Comment: only weed currently    Social History   Socioeconomic History   Marital status: Single    Spouse name: Not on file   Number of children: Not on file   Years of education: Not on file   Highest education level: Not on file  Occupational History   Not on file  Tobacco Use   Smoking status: Former    Current packs/day: 0.50    Average packs/day: 0.5 packs/day for 10.0 years (5.0 ttl pk-yrs)    Types: Cigarettes   Smokeless tobacco: Former    Quit date: 11/24/2014  Vaping Use   Vaping status: Never Used  Substance and Sexual Activity   Alcohol use: Yes    Alcohol/week: 1.0 standard drink of alcohol    Types: 1 Shots of liquor per week   Drug use: Yes    Types: Amphetamines, Cocaine, Marijuana, Benzodiazepines, MDMA (Ecstacy), Hydrocodone    Comment: only weed currently   Sexual activity: Yes    Birth control/protection: None  Other Topics Concern   Not on file  Social History Narrative   Not on file   Social Drivers of Health   Financial Resource Strain: Not on file  Food Insecurity: No Food Insecurity (10/02/2023)   Hunger Vital Sign    Worried About Running Out of Food in the Last Year: Never true    Ran Out of Food in the Last Year: Never true  Transportation Needs: No Transportation Needs (10/02/2023)   PRAPARE - Administrator, Civil Service (Medical): No    Lack of Transportation (Non-Medical): No  Physical Activity: Not on file  Stress: Not on file  Social Connections: Not on file   Past Medical History:  Past Medical History:  Diagnosis Date   Depression    Headache    Substance abuse (HCC)     Past Surgical History:  Procedure Laterality Date   FEMUR FRACTURE SURGERY      Current Medications: Current Facility-Administered Medications  Medication Dose Route Frequency Provider Last Rate Last Admin   acetaminophen  (TYLENOL ) tablet 650 mg  650 mg Oral Q6H PRN Mardy Legacy, NP   650 mg at 10/07/23 0338   alum & mag hydroxide-simeth (MAALOX/MYLANTA) 200-200-20 MG/5ML suspension 30 mL  30 mL Oral Q4H PRN Mardy Legacy, NP       benztropine  (COGENTIN ) tablet 1 mg  1 mg Oral BID PRN Mardy Legacy, NP       Or   benztropine  mesylate (COGENTIN ) injection 1 mg  1 mg Intramuscular BID PRN Mardy Legacy, NP       hydrOXYzine  (ATARAX ) tablet 25 mg  25 mg Oral TID PRN Mardy Legacy, NP   25 mg at 10/06/23 2134   magnesium  hydroxide (MILK OF MAGNESIA) suspension 30 mL  30 mL Oral Daily PRN Mardy Legacy, NP       OLANZapine  (ZYPREXA ) injection 10 mg  10 mg Intramuscular TID PRN Mardy Legacy, NP       OLANZapine  (ZYPREXA ) injection 5 mg  5 mg Intramuscular TID PRN Mardy Legacy, NP       OLANZapine  (ZYPREXA ) tablet 15 mg  15 mg Oral BID Cleotilde Hoy HERO, NP   15 mg at 10/09/23 9196   OLANZapine  zydis (ZYPREXA ) disintegrating tablet 5 mg  5 mg Oral TID PRN Mardy Legacy, NP       traZODone  (DESYREL ) tablet 50 mg  50 mg  Oral QHS PRN Millington, Matthew E, PA-C   50 mg at 10/08/23 2137    Lab Results:  No results found for this or any previous visit (from the past 48 hours).   Blood Alcohol level:  Lab Results  Component Value Date   [facility] <15 10/01/2023   ETH <10 01/18/2023    Metabolic Disorder Labs: Lab Results  Component Value Date   HGBA1C 5.6 10/05/2023   MPG 114.02 10/05/2023   MPG 123 04/05/2021   Lab Results  Component Value Date   PROLACTIN 25.5 (H) 12/25/2015   Lab Results  Component Value Date   CHOL 191 10/05/2023   TRIG 193 (H) 10/05/2023   HDL 49 10/05/2023   CHOLHDL 3.9 10/05/2023   VLDL 39 10/05/2023   LDLCALC 103 (H) 10/05/2023   LDLCALC 86 04/05/2021    Physical Findings: AIMS:  , ,  ,  ,    CIWA:    COWS:      Psychiatric Specialty Exam:  Presentation  General Appearance:  Bizarre; Disheveled  Eye Contact: Poor  Speech: Slow  Speech Volume: Decreased    Mood and Affect   Mood: Depressed  Affect: Constricted   Thought Process  Thought Processes: -- (minimal vague unable to participate in assessment)  Descriptions of Associations:-- (unable to assess related to poor engagement)  Orientation:Full (Time, Place and Person)  Thought Content:Illogical  Hallucinations:Hallucinations: -- (states I don't know)   Ideas of Reference:-- (states I don't know poor engagement)  Suicidal Thoughts:No data recorded   Homicidal Thoughts:No data recorded    Sensorium  Memory: Immediate Poor; Remote Poor  Judgment: Poor  Insight: Poor   Executive Functions  Concentration: Poor  Attention Span: Poor  Recall: [employer] of Knowledge: Fair  Language: Fair   Psychomotor Activity  Psychomotor Activity: Psychomotor Activity: Normal   Musculoskeletal: Strength & Muscle Tone: within normal limits Gait & Station: normal Assets  Assets: Social Support    Physical Exam: Physical Exam Vitals and nursing note reviewed.  HENT:     Head: Atraumatic.   Eyes:     Extraocular Movements: Extraocular movements intact.   Pulmonary:     Effort: Pulmonary effort is normal.   Neurological:     Mental Status: He is alert.    Review of Systems  Psychiatric/Behavioral:  Positive for hallucinations. Negative for depression, substance abuse and suicidal ideas. The patient is not nervous/anxious and does not have insomnia.    Blood pressure 108/69, pulse (!) 56, temperature 98 F (36.7 C), resp. rate 16, height 5' 6 (1.676 m), weight 56.2 kg, SpO2 98%. Body mass index is 20.01 kg/m.  Diagnosis: Principal Problem:   Psychosis (HCC)   PLAN: Safety and Monitoring:  -- Voluntary admission to inpatient psychiatric unit for safety, stabilization and treatment  -- Daily contact with patient to assess and evaluate symptoms and progress in treatment  -- Patient's case to be discussed in multi-disciplinary team meeting  -- Observation  Level : q15 minute checks  -- Vital signs:  q12 hours  -- Precautions: suicide, elopement, and assault -- Encouraged patient to participate in unit milieu and in scheduled group therapies  2. Psychiatric Diagnoses and Treatment:  Schizoaffective vs bipolar disorder.              - pt with acute psychosis with hallucinations, disorganization, and self care deficit. Pt unable to fully participate in exam given internal preoccupation. Patient continues to require inpatient psychiatric hospitalization due to severely impaired judgement,  danger to self, and others.    Increase Zyprexa  15 mg twice daily  -- The risks/benefits/side-effects/alternatives to this medication were discussed in detail with the patient and time was given for questions. The patient consents to medication trial.                -- Metabolic profile and EKG monitoring obtained while on an atypical antipsychotic (BMI: Lipid Panel: HbgA1c: QTc:394)             -- Encouraged patient to participate in unit milieu and in scheduled group therapies                            3. Medical Issues Being Addressed:   no acute concerns   4. Discharge Planning:              -- Social work and case management to assist with discharge planning and identification of hospital follow-up needs prior to discharge             -- Estimated LOS: 5-7 days             -- Discharge Concerns: Need to establish a safety plan; Medication compliance and effectiveness             -- Discharge Goals: Return home with outpatient referrals follow ups Hoy CHRISTELLA Pinal, NP 10/09/2023, 12:32 PM

## 2023-10-09 NOTE — Group Note (Signed)
 Recreation Therapy Group Note   Group Topic:Coping Skills  Group Date: 10/09/2023 Start Time: 1005 End Time: 1045 Facilitators: Celestia Jeoffrey BRAVO, LRT, CTRS Location: Courtyard  Group Description: [employer]. LRT and patients played games of basketball, drew with chalk, and played corn hole while outside in the courtyard while getting fresh air and sunlight. Music was being played in the background. LRT and peers conversed about different games they have played before, what they do in their free time and anything else that is on their minds. LRT encouraged pts to drink water after being outside, sweating and getting their heart rate up.  Goal Area(s) Addressed: Patient will build on frustration tolerance skills. Patients will partake in a competitive play game with peers. Patients will gain knowledge of new leisure interest/hobby.    Affect/Mood: N/A   Participation Level: Did not attend    Clinical Observations/Individualized Feedback: Patient did not attend group.   Plan: Continue to engage patient in RT group sessions 2-3x/week.   Jeoffrey BRAVO Celestia, LRT, CTRS 10/09/2023 1:20 PM

## 2023-10-09 NOTE — Group Note (Signed)
 LCSW Group Therapy Note  Group Date: 10/09/2023 Start Time: 1300 End Time: 1400   Type of Therapy and Topic:  Group Therapy: Anger Cues and Responses  Participation Level:  Did Not Attend   Description of Group:   In this group, patients learned how to recognize the physical, cognitive, emotional, and behavioral responses they have to anger-provoking situations.  They identified a recent time they became angry and how they reacted.  They analyzed how their reaction was possibly beneficial and how it was possibly unhelpful.  The group discussed a variety of healthier coping skills that could help with such a situation in the future.  Focus was placed on how helpful it is to recognize the underlying emotions to our anger, because working on those can lead to a more permanent solution as well as our ability to focus on the important rather than the urgent.  Therapeutic Goals: Patients will remember their last incident of anger and how they felt emotionally and physically, what their thoughts were at the time, and how they behaved. Patients will identify how their behavior at that time worked for them, as well as how it worked against them. Patients will explore possible new behaviors to use in future anger situations. Patients will learn that anger itself is normal and cannot be eliminated, and that healthier reactions can assist with resolving conflict rather than worsening situations.  Summary of Patient Progress:   Patient declined to attend group.   Therapeutic Modalities:   Cognitive Behavioral Therapy    Sherryle JINNY Margo, LCSW 10/09/2023  4:05 PM

## 2023-10-09 NOTE — Plan of Care (Signed)
  Problem: Education: Goal: Emotional status will improve Outcome: Progressing Goal: Mental status will improve Outcome: Progressing Goal: Verbalization of understanding the information provided will improve Outcome: Not Progressing   Problem: Activity: Goal: Interest or engagement in activities will improve Outcome: Not Progressing   Problem: Coping: Goal: Ability to demonstrate self-control will improve Outcome: Progressing

## 2023-10-09 NOTE — Progress Notes (Signed)
   10/09/23 2000  Psych Admission Type (Psych Patients Only)  Admission Status Involuntary  Psychosocial Assessment  Patient Complaints None  Eye Contact Brief  Facial Expression Animated  Affect Blunted  Speech Slow  Interaction Minimal  Motor Activity Slow  Appearance/Hygiene Improved  Behavior Characteristics Cooperative;Appropriate to situation  Mood Pleasant  Thought Process  Coherency WDL  Content WDL  Delusions None reported or observed  Perception Hallucinations  Hallucination Auditory  Judgment Limited  Confusion None  Danger to Self  Current suicidal ideation? Denies  Danger to Others  Danger to Others None reported or observed

## 2023-10-09 NOTE — Progress Notes (Signed)
   10/09/23 1424  Psych Admission Type (Psych Patients Only)  Admission Status Involuntary  Psychosocial Assessment  Patient Complaints None  Eye Contact Brief  Facial Expression Animated  Affect Blunted  Speech Slow  Interaction Minimal  Motor Activity Slow  Appearance/Hygiene Improved  Behavior Characteristics Cooperative  Mood Pleasant  Thought Process  Coherency WDL  Content WDL  Delusions None reported or observed  Perception Hallucinations  Hallucination Auditory  Judgment Limited  Confusion None  Danger to Self  Current suicidal ideation? Denies  Danger to Others  Danger to Others None reported or observed

## 2023-10-09 NOTE — Plan of Care (Signed)
  Problem: Education: Goal: Emotional status will improve Outcome: Progressing Goal: Verbalization of understanding the information provided will improve Outcome: Progressing   Problem: Activity: Goal: Interest or engagement in activities will improve Outcome: Progressing Goal: Sleeping patterns will improve Outcome: Progressing   Problem: Coping: Goal: Ability to verbalize frustrations and anger appropriately will improve Outcome: Progressing Goal: Ability to demonstrate self-control will improve Outcome: Progressing   Problem: Health Behavior/Discharge Planning: Goal: Compliance with treatment plan for underlying cause of condition will improve Outcome: Progressing   Problem: Education: Goal: Mental status will improve Outcome: Not Progressing Note: Patient responding to internal stimuli

## 2023-10-09 NOTE — BHH Counselor (Signed)
 CSW was contacted by grandmother, [name] Dollar [phone]). She inquired regarding what was going on with pt. She was informed that pt had not given CSW permission to speak with her regarding his care and that CSW would need to see if this was ok with the pt. She agreed. No other concerns expressed. Contact ended without incident.   CSW met with pt briefly about grandmother's contact. He agreed to let CSW speak with his grandmother and consent was completed for this contact.   CSW contacted grandmother, [name] Dollar. She was informed that pt was here and cooperative with taking medications. Dollar stated that pt could not come back to her house if he was not going to take his medication. She stated that he does not take his medication and said they really need someone to take him to go get a shot. CSW discussed ACTT services with her briefly and shared that pt would have to agree to this service. She voiced understanding. No other concerns expressed. Contact ended without incident.   Nadara SAUNDERS. Chaim, MSW, LCSW, LCAS 10/09/2023 1:49 PM

## 2023-10-10 MED ORDER — FLUPHENAZINE HCL 5 MG PO TABS
2.5000 mg | ORAL_TABLET | Freq: Two times a day (BID) | ORAL | Status: DC
  Administered 2023-10-10 – 2023-10-11 (×2): 2.5 mg via ORAL
  Filled 2023-10-10 (×5): qty 1

## 2023-10-10 NOTE — BHH Counselor (Signed)
 CSW spoke with grandmother, [name] Dollar [phone]). [place] and CSW discussed pt baseline. She shared that when pt is doing well he is more motivated to do the things that he needs to do. She shared that he takes care of his hygiene, can hold a conversation, and does not talk to himself. Dollar shared that this began a little over a month ago. She shared that in the past he was on an injection and pt did well on it. She stated that she was uncertain of what medication pt was on specifically but that she had got them from [facility] in [place] the last time that she got his medication. No other concerns expressed. Contact ended without incident.   Nadara SAUNDERS. Chaim, MSW, LCSW, LCAS 10/10/2023 3:36 PM

## 2023-10-10 NOTE — Progress Notes (Signed)
 Patient remained in room most of shift. He was compliant with medications. He denies SI/HI. He does still present with some bizarre thought processes, when this RN asked patient permission to do EKG, patient stated I don't have a heart, they took that out, with flat affect. He is cooperative on the unit.

## 2023-10-10 NOTE — Progress Notes (Signed)
 [facility] MD Progress Note  10/10/2023 3:33 PM Roger [name]  MRN:  [id number]  The patient is a 33 year old man with chart history of bipolar vs schizoaffective disorder; hx of polysubstance abuse, depression; he presented to ED after, reportedly, his grandmother contacted crisis services due to reported concerns re: patient responding to internal stimuli, not bathing, trespassing on church property, and digging a large hole in the backyard last week with unclear intent. Grandmother has expressed concerns with patient being discharged and, per chart, she is pursuing legal guardianship for patient via APS with hope for long-term placement. Patient was admitted to [facility] unit for psychosis . On exam today patient is disengaged and internally preoccupied.  He requires frequent redirection and is easily redirectable.  He is alert and oriented to location and self.  Indicates he does not know why he is in the hospital when questioned specifically about taking the whole he indicates he wanted to take a swim and hide from the world.  He denies SI HI and AVH however he is observed responding to internal stimuli and has discussion with self during the interview.  He is a poor historian and is unable to meaningfully fully participate in the interview given his internal preoccupations.  He denied substance use UDS was positive for THC.  He denied previous mental health admissions however they are found on chart review.  He endorses some paranoia but does not elaborate.  Per chart review there is a history of prior suicide attempts.  Given current psychosis patient requires ongoing inpatient psychiatric hospitalization for medication management and stabilization due to impaired judgment, danger to self, and danger to others.   Subjective:  Chart reviewed, case discussed in multidisciplinary meeting, patient seen during rounds.   Patient seen today for follow-up psychiatric evaluation. Upon approach, patient is noted  to be lying in bed with disheveled appearance and minimal responsiveness. When asked to sit up for the interview, he complies but remains withdrawn.  When questioned about mood, symptoms, or history, he continues to respond with "I don't know" or uses vague and repetitive statements like "note" or "quote". This provider emphasized the importance of understanding his experience to determine an effective treatment plan. Though initially confused about the current year, he eventually stated it was 2025. He shared, "I can't be the only one who's going through this," and acknowledged feeling confused and detached from his surroundings.  Review of events leading to this admission was attempted. Patient continued to deny understanding why he was digging a hole in the backyard, though he stated, "It feels like I'm possessed or being controlled by something."  He now endorses auditory hallucinations, along with derealization and thoughts of who is really in control. When asked about substance use, he reports only occasional marijuana use and denies use of other substances. He provided grandmother's contact information [name], [phone]) for collateral interview.  Patient continues to be minimally responsive, with flat affect, and remains mostly withdrawn and isolative, though staff note occasional presence in the milieu and normal meal intake without behavioral incident.  Sleep: Fair  Appetite:  Fair  Past Psychiatric History: see h&P Family History:  Family History  Problem Relation Age of Onset   Alcohol abuse Father    Social History:  Social History   Substance and Sexual Activity  Alcohol Use Yes   Alcohol/week: 1.0 standard drink of alcohol   Types: 1 Shots of liquor per week     Social History   Substance and  Sexual Activity  Drug Use Yes   Types: Amphetamines, Cocaine, Marijuana, Benzodiazepines, MDMA (Ecstacy), Hydrocodone   Comment: only weed currently    Social History    Socioeconomic History   Marital status: Single    Spouse name: Not on file   Number of children: Not on file   Years of education: Not on file   Highest education level: Not on file  Occupational History   Not on file  Tobacco Use   Smoking status: Former    Current packs/day: 0.50    Average packs/day: 0.5 packs/day for 10.0 years (5.0 ttl pk-yrs)    Types: Cigarettes   Smokeless tobacco: Former    Quit date: 11/24/2014  Vaping Use   Vaping status: Never Used  Substance and Sexual Activity   Alcohol use: Yes    Alcohol/week: 1.0 standard drink of alcohol    Types: 1 Shots of liquor per week   Drug use: Yes    Types: Amphetamines, Cocaine, Marijuana, Benzodiazepines, MDMA (Ecstacy), Hydrocodone    Comment: only weed currently   Sexual activity: Yes    Birth control/protection: None  Other Topics Concern   Not on file  Social History Narrative   Not on file   Social Drivers of Health   Financial Resource Strain: Not on file  Food Insecurity: No Food Insecurity (10/02/2023)   Hunger Vital Sign    Worried About Running Out of Food in the Last Year: Never true    Ran Out of Food in the Last Year: Never true  Transportation Needs: No Transportation Needs (10/02/2023)   PRAPARE - Administrator, Civil Service (Medical): No    Lack of Transportation (Non-Medical): No  Physical Activity: Not on file  Stress: Not on file  Social Connections: Not on file   Past Medical History:  Past Medical History:  Diagnosis Date   Depression    Headache    Substance abuse (HCC)     Past Surgical History:  Procedure Laterality Date   FEMUR FRACTURE SURGERY      Current Medications: Current Facility-Administered Medications  Medication Dose Route Frequency Provider Last Rate Last Admin   acetaminophen  (TYLENOL ) tablet 650 mg  650 mg Oral Q6H PRN Mardy Legacy, NP   650 mg at 10/10/23 1011   alum & mag hydroxide-simeth (MAALOX/MYLANTA) 200-200-20 MG/5ML suspension 30  mL  30 mL Oral Q4H PRN Mardy Legacy, NP       benztropine  (COGENTIN ) tablet 1 mg  1 mg Oral BID PRN Mardy Legacy, NP       Or   benztropine  mesylate (COGENTIN ) injection 1 mg  1 mg Intramuscular BID PRN Mardy Legacy, NP       fluPHENAZine (PROLIXIN) tablet 2.5 mg  2.5 mg Oral BID Cleotilde Hoy HERO, NP       hydrOXYzine  (ATARAX ) tablet 25 mg  25 mg Oral TID PRN Mardy Legacy, NP   25 mg at 10/06/23 2134   magnesium  hydroxide (MILK OF MAGNESIA) suspension 30 mL  30 mL Oral Daily PRN Mardy Legacy, NP       OLANZapine  (ZYPREXA ) injection 10 mg  10 mg Intramuscular TID PRN Mardy Legacy, NP       OLANZapine  (ZYPREXA ) injection 5 mg  5 mg Intramuscular TID PRN Mardy Legacy, NP       OLANZapine  (ZYPREXA ) tablet 15 mg  15 mg Oral BID Cleotilde Hoy HERO, NP   15 mg at 10/10/23 0857   OLANZapine  zydis (ZYPREXA ) disintegrating tablet 5  mg  5 mg Oral TID PRN Mardy Legacy, NP       traZODone  (DESYREL ) tablet 50 mg  50 mg Oral QHS PRN Millington, Matthew E, PA-C   50 mg at 10/08/23 2137    Lab Results:  No results found for this or any previous visit (from the past 48 hours).   Blood Alcohol level:  Lab Results  Component Value Date   [facility] <15 10/01/2023   ETH <10 01/18/2023    Metabolic Disorder Labs: Lab Results  Component Value Date   HGBA1C 5.6 10/05/2023   MPG 114.02 10/05/2023   MPG 123 04/05/2021   Lab Results  Component Value Date   PROLACTIN 25.5 (H) 12/25/2015   Lab Results  Component Value Date   CHOL 191 10/05/2023   TRIG 193 (H) 10/05/2023   HDL 49 10/05/2023   CHOLHDL 3.9 10/05/2023   VLDL 39 10/05/2023   LDLCALC 103 (H) 10/05/2023   LDLCALC 86 04/05/2021      Psychiatric Specialty Exam:  Presentation  General Appearance:  Fairly Groomed  Eye Contact: Fleeting  Speech: Other (comment) (nonspontaneous)  Speech Volume: Decreased    Mood and Affect  Mood: Depressed  Affect: Blunt   Thought Process  Thought  Processes: Disorganized  Descriptions of Associations:Tangential  Orientation:Full (Time, Place and Person)  Thought Content:Illogical  Hallucinations:Hallucinations: Auditory   Ideas of Reference:-- (states I don't know poor engagement)  Suicidal Thoughts:Suicidal Thoughts: No    Homicidal Thoughts:Homicidal Thoughts: No     Sensorium  Memory: Immediate Poor; Remote Poor  Judgment: Poor  Insight: Poor   Executive Functions  Concentration: Poor  Attention Span: Poor  Recall: Fair  Fund of Knowledge: Fair  Language: Fair   Psychomotor Activity  Psychomotor Activity: Psychomotor Activity: Normal   Musculoskeletal: Strength & Muscle Tone: within normal limits Gait & Station: normal Assets  Assets: Social Support    Physical Exam: Physical Exam Vitals and nursing note reviewed.  HENT:     Head: Atraumatic.   Eyes:     Extraocular Movements: Extraocular movements intact.   Pulmonary:     Effort: Pulmonary effort is normal.   Neurological:     Mental Status: He is alert.    Review of Systems  Psychiatric/Behavioral:  Positive for hallucinations. Negative for depression, substance abuse and suicidal ideas. The patient is not nervous/anxious and does not have insomnia.    Blood pressure 105/72, pulse 62, temperature 98.1 F (36.7 C), resp. rate 19, height 5' 6 (1.676 m), weight 56.2 kg, SpO2 99%. Body mass index is 20.01 kg/m.  Diagnosis: Principal Problem:   Psychosis (HCC)   PLAN: Safety and Monitoring:  -- Voluntary admission to inpatient psychiatric unit for safety, stabilization and treatment  -- Daily contact with patient to assess and evaluate symptoms and progress in treatment  -- Patient's case to be discussed in multi-disciplinary team meeting  -- Observation Level : q15 minute checks  -- Vital signs:  q12 hours  -- Precautions: suicide, elopement, and assault -- Encouraged patient to participate in unit milieu  and in scheduled group therapies   2. 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 Insight remains extremely limited and engagement inconsistent.  Zyprexa  increased to 15 mg BID  Prolixin 2.5 mg PO BID initiated today to augment treatment due to persistent psychotic symptoms and Zyprexa  being at maximum daily dose EKG obtained  today: normal sinus rhythm, QTc 406  Monitor for extrapyramidal symptoms and side effects with dual antipsychotic therapy Encourage gradual engagement with milieu and supportive staff  Patient denies medication side effects and there are none noted  Collateral contact initiated with grandmother [name] [phone])  Medication education provided to include risks, benefits, and side effects  Patient verbalized limited understanding but does not resist care  Patient remains appropriate for the inpatient psychiatric setting for safety, medication management, and symptom stabilization  -- The risks/benefits/side-effects/alternatives to this medication were discussed in detail with the patient and time was given for questions. The patient consents to medication trial.                -- Metabolic profile and EKG monitoring obtained while on an atypical antipsychotic (BMI: Lipid Panel: HbgA1c: QTc:394)             -- Encouraged patient to participate in unit milieu and in scheduled group therapies                            3. Medical Issues Being Addressed:   no acute concerns   4. Discharge Planning:              -- Social work and case management to assist with discharge planning and identification of hospital follow-up needs prior to discharge             -- Estimated LOS: 5-7 days             -- Discharge Concerns: Need to establish a safety plan;  Medication compliance and effectiveness             -- Discharge Goals: Return home with outpatient referrals follow ups Hoy CHRISTELLA Pinal, NP 10/10/2023, 3:33 PM

## 2023-10-10 NOTE — Group Note (Signed)
 [facility] LCSW Group Therapy Note   Group Date: 10/10/2023 Start Time: 1300 End Time: 1400   Type of Therapy/Topic:  Group Therapy:  Emotion Regulation  Participation Level:  Did Not Attend   Mood:  Description of Group:    The purpose of this group is to assist patients in learning to regulate negative emotions and experience positive emotions. Patients will be guided to discuss ways in which they have been vulnerable to their negative emotions. These vulnerabilities will be juxtaposed with experiences of positive emotions or situations, and patients challenged to use positive emotions to combat negative ones. Special emphasis will be placed on coping with negative emotions in conflict situations, and patients will process healthy conflict resolution skills.  Therapeutic Goals: Patient will identify two positive emotions or experiences to reflect on in order to balance out negative emotions:  Patient will label two or more emotions that they find the most difficult to experience:  Patient will be able to demonstrate positive conflict resolution skills through discussion or role plays:   Summary of Patient Progress:   Patient did not attend group.     Therapeutic Modalities:   Cognitive Behavioral Therapy Feelings Identification Dialectical Behavioral Therapy   Alveta CHRISTELLA Kerns, LCSW

## 2023-10-10 NOTE — Plan of Care (Signed)
   Problem: Education: Goal: Emotional status will improve Outcome: Not Progressing Goal: Mental status will improve Outcome: Not Progressing

## 2023-10-10 NOTE — Group Note (Signed)
 Date:  10/10/2023 Time:  10:38 AM  Group Topic/Focus:  Building Self Esteem:   The Focus of this group is helping patients become aware of the effects of self-esteem on their lives, the things they and others do that enhance or undermine their self-esteem, seeing the relationship between their level of self-esteem and the choices they make and learning ways to enhance self-esteem.    Participation Level:  Did Not Attend   [name] Roger [name] 10/10/2023, 10:38 AM

## 2023-10-10 NOTE — Progress Notes (Signed)
 Pt calm and pleasant during assessment denying SI/HI/AVH. Pt presents as responding to internal stimuli. Pt observed by this Clinical research associate interacting appropriately with staff and peers on the unit. Pt compliant with medication administration per MD orders. Pt given education, support, and encouragement to be active in his treatment plan. Pt being monitored Q 15 minutes for safety per unit protocol, remains safe on the unit

## 2023-10-10 NOTE — Group Note (Signed)
 Date:  10/10/2023 Time:  12:57 PM  Group Topic/Focus:  Dimensions of Wellness:   The focus of this group is to introduce the topic of wellness and discuss the role each dimension of wellness plays in total health.    Participation Level:  Did Not Attend   [name] Zianne Schubring 10/10/2023, 12:57 PM

## 2023-10-11 MED ORDER — FLUPHENAZINE HCL 2.5 MG PO TABS
2.5000 mg | ORAL_TABLET | Freq: Three times a day (TID) | ORAL | Status: DC
  Filled 2023-10-11: qty 1

## 2023-10-11 MED ORDER — FLUPHENAZINE HCL 5 MG PO TABS
2.5000 mg | ORAL_TABLET | Freq: Three times a day (TID) | ORAL | Status: DC
  Administered 2023-10-11 – 2023-10-12 (×3): 2.5 mg via ORAL
  Filled 2023-10-11 (×4): qty 1

## 2023-10-11 NOTE — Progress Notes (Signed)
 [facility] MD Progress Note  10/11/2023 5:19 PM [name]  MRN:  [id number]  The patient is a 33 year old man with chart history of bipolar vs schizoaffective disorder; hx of polysubstance abuse, depression; he presented to ED after, reportedly, his grandmother contacted crisis services due to reported concerns re: patient responding to internal stimuli, not bathing, trespassing on church property, and digging a large hole in the backyard last week with unclear intent. Grandmother has expressed concerns with patient being discharged and, per chart, she is pursuing legal guardianship for patient via APS with hope for long-term placement. Patient was admitted to [name] for psychosis . On exam today patient is disengaged and internally preoccupied.  He requires frequent redirection and is easily redirectable.  He is alert and oriented to location and self.  Indicates he does not know why he is in the [name] when questioned specifically about taking the whole he indicates he wanted to take a swim and hide from the world.  He denies SI HI and AVH however he is observed responding to internal stimuli and has discussion with self during the interview.  He is a poor historian and is unable to meaningfully fully participate in the interview given his internal preoccupations.  He denied substance use UDS was positive for THC.  He denied previous mental health admissions however they are found on chart review.  He endorses some paranoia but does not elaborate.  Per chart review there is a history of prior suicide attempts.  Given current psychosis patient requires ongoing inpatient psychiatric hospitalization for medication management and stabilization due to impaired judgment, danger to self, and danger to others.   Subjective:  Chart reviewed, case discussed in multidisciplinary meeting, patient seen during rounds.   Patient seen for follow up. He states I feel like I'm in a bad dream or something, maybe  I am in a fake world, He remains confused unable to provide any psychiatric history. He is able to provide his birthday, participate in recall exercise and serial 7's but over all unclear why he is in the [name] stating I think I turned myself in despite attempts to orient to plan of care and presentation he remains unclear stating I feel like I'm in a dream or something. He denies depression, reports sleep and appetite are stable. Denies SI/HI. Patient is provided education on current presentation and plan of care to which he shows little understanding but agrees and remains in bed without complaint. Pt is medication compliant and there have been no inappropriate behaviors reported. Will continue plan of care and titrate medications to target continued psychosis.    10/10/23: Patient seen today for follow-up psychiatric evaluation. Upon approach, patient is noted to be lying in bed with disheveled appearance and minimal responsiveness. When asked to sit up for the interview, he complies but remains withdrawn. When questioned about mood, symptoms, or history, he continues to respond with "I don't know" or uses vague and repetitive statements like "note" or "quote". This provider emphasized the importance of understanding his experience to determine an effective treatment plan. Though initially confused about the current year, he eventually stated it was 2025. He shared, "I can't be the only one who's going through this," and acknowledged feeling confused and detached from his surroundings. Review of events leading to this admission was attempted. Patient continued to deny understanding why he was digging a hole in the backyard, though he stated, "It feels like I'm possessed or being controlled by something."  Continues to endorse auditory hallucinations, along with derealization and thoughts of who is really in control. When asked about substance use, he reports only occasional marijuana use and  denies use of other substances. He provided grandmother's contact information [name], [phone]) for collateral interview.    Sleep: Fair  Appetite:  Fair  Past Psychiatric History: see h&P Family History:  Family History  Problem Relation Age of Onset   Alcohol abuse Father    Social History:  Social History   Substance and Sexual Activity  Alcohol Use Yes   Alcohol/week: 1.0 standard drink of alcohol   Types: 1 Shots of liquor per week     Social History   Substance and Sexual Activity  Drug Use Yes   Types: Amphetamines, Cocaine, Marijuana, Benzodiazepines, MDMA (Ecstacy), Hydrocodone   Comment: only weed currently    Social History   Socioeconomic History   Marital status: Single    Spouse name: Not on file   Number of children: Not on file   Years of education: Not on file   Highest education level: Not on file  Occupational History   Not on file  Tobacco Use   Smoking status: Former    Current packs/day: 0.50    Average packs/day: 0.5 packs/day for 10.0 years (5.0 ttl pk-yrs)    Types: Cigarettes   Smokeless tobacco: Former    Quit date: 11/24/2014  Vaping Use   Vaping status: Never Used  Substance and Sexual Activity   Alcohol use: Yes    Alcohol/week: 1.0 standard drink of alcohol    Types: 1 Shots of liquor per week   Drug use: Yes    Types: Amphetamines, Cocaine, Marijuana, Benzodiazepines, MDMA (Ecstacy), Hydrocodone    Comment: only weed currently   Sexual activity: Yes    Birth control/protection: None  Other Topics Concern   Not on file  Social History Narrative   Not on file   Social Drivers of Health   Financial Resource Strain: Not on file  Food Insecurity: No Food Insecurity (10/02/2023)   Hunger Vital Sign    Worried About Running Out of Food in the Last Year: Never true    Ran Out of Food in the Last Year: Never true  Transportation Needs: No Transportation Needs (10/02/2023)   PRAPARE - Administrator, Civil Service  (Medical): No    Lack of Transportation (Non-Medical): No  Physical Activity: Not on file  Stress: Not on file  Social Connections: Not on file   Past Medical History:  Past Medical History:  Diagnosis Date   Depression    Headache    Substance abuse (HCC)     Past Surgical History:  Procedure Laterality Date   FEMUR FRACTURE SURGERY      Current Medications: Current Facility-Administered Medications  Medication Dose Route Frequency Provider Last Rate Last Admin   acetaminophen  (TYLENOL ) tablet 650 mg  650 mg Oral Q6H PRN Mardy Legacy, NP   650 mg at 10/10/23 1011   alum & mag hydroxide-simeth (MAALOX/MYLANTA) 200-200-20 MG/5ML suspension 30 mL  30 mL Oral Q4H PRN Mardy Legacy, NP       benztropine  (COGENTIN ) tablet 1 mg  1 mg Oral BID PRN Mardy Legacy, NP       Or   benztropine  mesylate (COGENTIN ) injection 1 mg  1 mg Intramuscular BID PRN Mardy Legacy, NP       fluPHENAZine (PROLIXIN) tablet 2.5 mg  2.5 mg Oral TID Niels Kayla FALCON, [facility]  2.5 mg at 10/11/23 1708   hydrOXYzine  (ATARAX ) tablet 25 mg  25 mg Oral TID PRN Mardy Legacy, NP   25 mg at 10/06/23 2134   magnesium  hydroxide (MILK OF MAGNESIA) suspension 30 mL  30 mL Oral Daily PRN Mardy Legacy, NP       OLANZapine  (ZYPREXA ) injection 10 mg  10 mg Intramuscular TID PRN Mardy Legacy, NP       OLANZapine  (ZYPREXA ) injection 5 mg  5 mg Intramuscular TID PRN Mardy Legacy, NP       OLANZapine  (ZYPREXA ) tablet 15 mg  15 mg Oral BID Cleotilde Hoy HERO, NP   15 mg at 10/11/23 0840   OLANZapine  zydis (ZYPREXA ) disintegrating tablet 5 mg  5 mg Oral TID PRN Mardy Legacy, NP       traZODone  (DESYREL ) tablet 50 mg  50 mg Oral QHS PRN Millington, Matthew E, PA-C   50 mg at 10/10/23 2128    Lab Results:  No results found for this or any previous visit (from the past 48 hours).   Blood Alcohol level:  Lab Results  Component Value Date   [name] <15 10/01/2023   ETH <10 01/18/2023    Metabolic  Disorder Labs: Lab Results  Component Value Date   HGBA1C 5.6 10/05/2023   MPG 114.02 10/05/2023   MPG 123 04/05/2021   Lab Results  Component Value Date   PROLACTIN 25.5 (H) 12/25/2015   Lab Results  Component Value Date   CHOL 191 10/05/2023   TRIG 193 (H) 10/05/2023   HDL 49 10/05/2023   CHOLHDL 3.9 10/05/2023   VLDL 39 10/05/2023   LDLCALC 103 (H) 10/05/2023   LDLCALC 86 04/05/2021      Psychiatric Specialty Exam:  Presentation  General Appearance:  Fairly Groomed  Eye Contact: Fleeting  Speech: Other (comment) (nonspontaneous)  Speech Volume: Decreased    Mood and Affect  Mood: Depressed  Affect: Blunt   Thought Process  Thought Processes: Disorganized  Descriptions of Associations:Tangential  Orientation:Full (Time, Place and Person)  Thought Content:Illogical  Hallucinations:Hallucinations: Auditory   Ideas of Reference:-- (states I don't know poor engagement)  Suicidal Thoughts:Suicidal Thoughts: No    Homicidal Thoughts:Homicidal Thoughts: No     Sensorium  Memory: Immediate Poor; Remote Poor  Judgment: Poor  Insight: Poor   Executive Functions  Concentration: Poor  Attention Span: Poor  Recall: Fair  Fund of Knowledge: Fair  Language: Fair   Psychomotor Activity  Psychomotor Activity: Psychomotor Activity: Normal   Musculoskeletal: Strength & Muscle Tone: within normal limits Gait & Station: normal Assets  Assets: Social Support    Physical Exam: Physical Exam Vitals and nursing note reviewed.  HENT:     Head: Atraumatic.   Eyes:     Extraocular Movements: Extraocular movements intact.   Pulmonary:     Effort: Pulmonary effort is normal.   Neurological:     Mental Status: He is alert.    Review of Systems  Psychiatric/Behavioral:  Positive for hallucinations. Negative for depression, substance abuse and suicidal ideas. The patient is not nervous/anxious and does not have  insomnia.    Blood pressure 112/74, pulse 60, temperature 98.2 F (36.8 C), resp. rate 20, height 5' 6 (1.676 m), weight 56.2 kg, SpO2 99%. Body mass index is 20.01 kg/m.  Diagnosis: Principal Problem:   Psychosis (HCC)   PLAN: Safety and Monitoring:  -- Voluntary admission to inpatient psychiatric unit for safety, stabilization and treatment  -- Daily contact with patient to assess and  evaluate symptoms and progress in treatment  -- Patient's case to be discussed in multi-disciplinary team meeting  -- Observation Level : q15 minute checks  -- Vital signs:  q12 hours  -- Precautions: suicide, elopement, and assault -- Encouraged patient to participate in unit milieu and in scheduled group therapies   2. 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 Insight remains extremely limited and engagement inconsistent.  Zyprexa  increased to 15 mg BID  Increase Prolixin 2.5 mg PO TID initiated today to augment treatment due to persistent psychotic symptoms and Zyprexa  being at maximum daily dose EKG obtained 10/09/24: normal sinus rhythm, QTc 406  Monitor for extrapyramidal symptoms and side effects with dual antipsychotic therapy Encourage gradual engagement with milieu and supportive staff  Patient denies medication side effects and there are none noted  Collateral contact initiated with grandmother [name] [phone])  Medication education provided to include risks, benefits, and side effects  Patient verbalized limited understanding but does not resist care  Patient remains appropriate for the inpatient psychiatric setting for safety, medication management, and symptom stabilization  -- The risks/benefits/side-effects/alternatives to this medication were discussed in  detail with the patient and time was given for questions. The patient consents to medication trial.                -- Metabolic profile and EKG monitoring obtained while on an atypical antipsychotic (BMI: Lipid Panel: HbgA1c: QTc:394)             -- Encouraged patient to participate in unit milieu and in scheduled group therapies                            3. Medical Issues Being Addressed:   no acute concerns   4. Discharge Planning:              -- Social work and case management to assist with discharge planning and identification of [name] follow-up needs prior to discharge             -- Estimated LOS: 5-7 days             -- Discharge Concerns: Need to establish a safety plan; Medication compliance and effectiveness             -- Discharge Goals: Return home with outpatient referrals follow ups Hoy CHRISTELLA Pinal, NP 10/11/2023, 5:19 PM

## 2023-10-11 NOTE — Group Note (Signed)
 [facility] LCSW Group Therapy Note   Group Date: 10/11/2023 Start Time: 1300 End Time: 1350   Type of Therapy/Topic:  Group Therapy:  Balance in Life  Participation Level:  Did Not Attend   Description of Group:    This group will address the concept of balance and how it feels and looks when one is unbalanced. Patients will be encouraged to process areas in their lives that are out of balance, and identify reasons for remaining unbalanced. Facilitators will guide patients utilizing problem- solving interventions to address and correct the stressor making their life unbalanced. Understanding and applying boundaries will be explored and addressed for obtaining  and maintaining a balanced life. Patients will be encouraged to explore ways to assertively make their unbalanced needs known to significant others in their lives, using other group members and facilitator for support and feedback.  Therapeutic Goals: Patient will identify two or more emotions or situations they have that consume much of in their lives. Patient will identify signs/triggers that life has become out of balance:  Patient will identify two ways to set boundaries in order to achieve balance in their lives:  Patient will demonstrate ability to communicate their needs through discussion and/or role plays  Summary of Patient Progress: Patient did not attend group.    Therapeutic Modalities:   Cognitive Behavioral Therapy Solution-Focused Therapy Assertiveness Training   Nadara JONELLE Fam, LCSW

## 2023-10-11 NOTE — Plan of Care (Signed)
   Problem: Activity: Goal: Sleeping patterns will improve Outcome: Progressing   Problem: Safety: Goal: Periods of time without injury will increase Outcome: Progressing

## 2023-10-11 NOTE — Group Note (Signed)
 Date:  10/11/2023 Time:  5:09 PM  Group Topic/Focus:  Wellness Toolbox:   The focus of this group is to discuss various aspects of wellness, balancing those aspects and exploring ways to increase the ability to experience wellness.  Patients will create a wellness toolbox for use upon discharge.    Participation Level:  Did Not Attend   Deitra Clap [facility] 10/11/2023, 5:09 PM

## 2023-10-11 NOTE — Group Note (Signed)
 Date:  10/11/2023 Time:  5:40 PM  Group Topic/Focus:  Goals Group:   The focus of this group is to help patients establish daily goals to achieve during treatment and discuss how the patient can incorporate goal setting into their daily lives to aide in recovery.    Participation Level:  Did Not Attend   Deitra Clap [facility] 10/11/2023, 5:40 PM

## 2023-10-11 NOTE — Group Note (Signed)
 Date:  10/11/2023 Time:  12:28 AM  Group Topic/Focus:  Overcoming Stress:   The focus of this group is to define stress and help patients assess their triggers.    Participation Level:  Active  Participation Quality:  Appropriate and Attentive  Affect:  Appropriate  Cognitive:  Alert and Appropriate  Insight: Appropriate and Good  Engagement in Group:  Limited  Modes of Intervention:  Clarification, Discussion, Rapport Building, and Support  Additional Comments:     Tyler Cubit 10/11/2023, 12:28 AM

## 2023-10-11 NOTE — Plan of Care (Signed)
  Problem: Education: Goal: Emotional status will improve Outcome: Progressing Goal: Mental status will improve Outcome: Progressing   Problem: Activity: Goal: Interest or engagement in activities will improve Outcome: Not Progressing   Problem: Health Behavior/Discharge Planning: Goal: Compliance with treatment plan for underlying cause of condition will improve Outcome: Progressing   Problem: Physical Regulation: Goal: Ability to maintain clinical measurements within normal limits will improve Outcome: Progressing   Problem: Safety: Goal: Periods of time without injury will increase Outcome: Progressing

## 2023-10-12 MED ORDER — OLANZAPINE 10 MG PO TABS
10.0000 mg | ORAL_TABLET | Freq: Two times a day (BID) | ORAL | Status: DC
  Administered 2023-10-12 – 2023-10-24 (×24): 10 mg via ORAL
  Filled 2023-10-12 (×24): qty 1

## 2023-10-12 MED ORDER — BENZTROPINE MESYLATE 1 MG PO TABS: 1.0000 mg | ORAL_TABLET | Freq: Two times a day (BID) | ORAL | Status: DC | PRN

## 2023-10-12 MED ORDER — FLUPHENAZINE HCL 5 MG PO TABS
5.0000 mg | ORAL_TABLET | Freq: Two times a day (BID) | ORAL | Status: DC
  Administered 2023-10-12 – 2023-10-14 (×4): 5 mg via ORAL
  Filled 2023-10-12 (×6): qty 1

## 2023-10-12 NOTE — Plan of Care (Signed)
  Problem: Education: Goal: Knowledge of [facility] Education information/materials will improve Outcome: Progressing Goal: Emotional status will improve Outcome: Progressing Goal: Mental status will improve Outcome: Progressing   Problem: Activity: Goal: Interest or engagement in activities will improve Outcome: Progressing   Problem: Coping: Goal: Ability to verbalize frustrations and anger appropriately will improve Outcome: Progressing   Problem: Safety: Goal: Periods of time without injury will increase Outcome: Progressing

## 2023-10-12 NOTE — Group Note (Signed)
 Date:  10/12/2023 Time:  4:05 PM  Group Topic/Focus:  Coping With Mental Health Crisis:   The purpose of this group is to help patients identify strategies for coping with mental health crisis.  Group discusses possible causes of crisis and ways to manage them effectively.    Participation Level:  Did Not Attend   [name] Keyaan Lederman 10/12/2023, 4:05 PM

## 2023-10-12 NOTE — Progress Notes (Addendum)
 Patient continues to deny depressive symptoms and suicidal or homicidal ideation. He also denied AVH but, noted smiling and and mumbling to himself, mood seems much improved and he is compliant with taking his medications.    10/12/23 0900  Psych Admission Type (Psych Patients Only)  Admission Status Involuntary  Psychosocial Assessment  Patient Complaints None  Eye Contact Brief  Facial Expression Animated  Affect Blunted  Speech Soft  Interaction Minimal  Motor Activity Slow  Appearance/Hygiene Unremarkable  Behavior Characteristics Cooperative  Mood Pleasant  Thought Process  Coherency WDL  Content WDL  Delusions None reported or observed  Perception Hallucinations  Hallucination Auditory  Judgment Impaired  Confusion None  Danger to Self  Current suicidal ideation? Denies  Danger to Others  Danger to Others None reported or observed

## 2023-10-12 NOTE — Progress Notes (Signed)
   10/11/23 2000  Psych Admission Type (Psych Patients Only)  Admission Status Involuntary  Psychosocial Assessment  Patient Complaints None  Eye Contact Brief  Facial Expression Animated  Affect Blunted  Speech Soft  Interaction Minimal  Motor Activity Slow  Appearance/Hygiene Unremarkable  Behavior Characteristics Cooperative  Mood Pleasant  Aggressive Behavior  Effect No apparent injury  Thought Process  Coherency WDL  Content WDL  Delusions None reported or observed  Perception Hallucinations  Hallucination Auditory  Judgment Impaired  Confusion None  Danger to Self  Current suicidal ideation? Denies  Danger to Others  Danger to Others None reported or observed

## 2023-10-12 NOTE — Progress Notes (Signed)
 [facility] MD Progress Note  10/12/2023 4:23 PM Roger [name]  MRN:  [id number]  The patient is a 33 year old man with chart history of bipolar vs schizoaffective disorder; hx of polysubstance abuse, depression; he presented to ED after, reportedly, his grandmother contacted crisis services due to reported concerns re: patient responding to internal stimuli, not bathing, trespassing on church property, and digging a large hole in the backyard last week with unclear intent. Grandmother has expressed concerns with patient being discharged and, per chart, she is pursuing legal guardianship for patient via APS with hope for long-term placement. Patient was admitted to [facility] unit for psychosis . On exam today patient is disengaged and internally preoccupied.  He requires frequent redirection and is easily redirectable.  He is alert and oriented to location and self.  Indicates he does not know why he is in the hospital when questioned specifically about taking the whole he indicates he wanted to take a swim and hide from the world.  He denies SI HI and AVH however he is observed responding to internal stimuli and has discussion with self during the interview.  He is a poor historian and is unable to meaningfully fully participate in the interview given his internal preoccupations.  He denied substance use UDS was positive for THC.  He denied previous mental health admissions however they are found on chart review.  He endorses some paranoia but does not elaborate.  Per chart review there is a history of prior suicide attempts.  Given current psychosis patient requires ongoing inpatient psychiatric hospitalization for medication management and stabilization due to impaired judgment, danger to self, and danger to others.   Subjective:  Chart reviewed, case discussed in multidisciplinary meeting, patient seen during rounds.   Patient seen today for follow-up psychiatric evaluation. Upon approach, he is noted to be  walking in the hall and recognizes this provider. We review events leading to this admission, during which he continues to express confusion about the reason for his hospitalization. He maintains the belief that he is "in a bad dream" or "in a fake world."  Patient is noted to be malodorous today. He reports eating well and sleeping well. Encouraged to participate in activities of daily living (ADLs). He denies auditory or visual hallucinations, paranoia, or delusions. However, staff observations indicate the patient appears internally preoccupied at times throughout the day.  Patient continues to deny depressive symptoms and suicidal or homicidal ideation. He is not overtly psychotic in behavior or presentation, but remains grossly delusional and lacks insight into his condition.  When asked about substance use, he reports only occasional marijuana use and denies use of other substances. He provided grandmother's contact information [name], [phone]) for collateral interview.    Sleep: Fair  Appetite:  Fair  Past Psychiatric History: see h&P Family History:  Family History  Problem Relation Age of Onset   Alcohol abuse Father    Social History:  Social History   Substance and Sexual Activity  Alcohol Use Yes   Alcohol/week: 1.0 standard drink of alcohol   Types: 1 Shots of liquor per week     Social History   Substance and Sexual Activity  Drug Use Yes   Types: Amphetamines, Cocaine, Marijuana, Benzodiazepines, MDMA (Ecstacy), Hydrocodone   Comment: only weed currently    Social History   Socioeconomic History   Marital status: Single    Spouse name: Not on file   Number of children: Not on file   Years of education:  Not on file   Highest education level: Not on file  Occupational History   Not on file  Tobacco Use   Smoking status: Former    Current packs/day: 0.50    Average packs/day: 0.5 packs/day for 10.0 years (5.0 ttl pk-yrs)    Types: Cigarettes    Smokeless tobacco: Former    Quit date: 11/24/2014  Vaping Use   Vaping status: Never Used  Substance and Sexual Activity   Alcohol use: Yes    Alcohol/week: 1.0 standard drink of alcohol    Types: 1 Shots of liquor per week   Drug use: Yes    Types: Amphetamines, Cocaine, Marijuana, Benzodiazepines, MDMA (Ecstacy), Hydrocodone    Comment: only weed currently   Sexual activity: Yes    Birth control/protection: None  Other Topics Concern   Not on file  Social History Narrative   Not on file   Social Drivers of Health   Financial Resource Strain: Not on file  Food Insecurity: No Food Insecurity (10/02/2023)   Hunger Vital Sign    Worried About Running Out of Food in the Last Year: Never true    Ran Out of Food in the Last Year: Never true  Transportation Needs: No Transportation Needs (10/02/2023)   PRAPARE - Administrator, Civil Service (Medical): No    Lack of Transportation (Non-Medical): No  Physical Activity: Not on file  Stress: Not on file  Social Connections: Not on file   Past Medical History:  Past Medical History:  Diagnosis Date   Depression    Headache    Substance abuse (HCC)     Past Surgical History:  Procedure Laterality Date   FEMUR FRACTURE SURGERY      Current Medications: Current Facility-Administered Medications  Medication Dose Route Frequency Provider Last Rate Last Admin   acetaminophen  (TYLENOL ) tablet 650 mg  650 mg Oral Q6H PRN Mardy Legacy, NP   650 mg at 10/10/23 1011   alum & mag hydroxide-simeth (MAALOX/MYLANTA) 200-200-20 MG/5ML suspension 30 mL  30 mL Oral Q4H PRN Mardy Legacy, NP       benztropine  (COGENTIN ) tablet 1 mg  1 mg Oral BID PRN Mardy Legacy, NP       Or   benztropine  mesylate (COGENTIN ) injection 1 mg  1 mg Intramuscular BID PRN Mardy Legacy, NP       benztropine  (COGENTIN ) tablet 1 mg  1 mg Oral BID PRN Cleotilde Hoy HERO, NP       fluPHENAZine  (PROLIXIN ) tablet 5 mg  5 mg Oral BID Cleotilde Hoy HERO, NP       hydrOXYzine  (ATARAX ) tablet 25 mg  25 mg Oral TID PRN Mardy Legacy, NP   25 mg at 10/06/23 2134   magnesium  hydroxide (MILK OF MAGNESIA) suspension 30 mL  30 mL Oral Daily PRN Mardy Legacy, NP       OLANZapine  (ZYPREXA ) injection 10 mg  10 mg Intramuscular TID PRN Mardy Legacy, NP       OLANZapine  (ZYPREXA ) injection 5 mg  5 mg Intramuscular TID PRN Mardy Legacy, NP       OLANZapine  (ZYPREXA ) tablet 10 mg  10 mg Oral BID Cleotilde Hoy HERO, NP       OLANZapine  zydis (ZYPREXA ) disintegrating tablet 5 mg  5 mg Oral TID PRN Mardy Legacy, NP       traZODone  (DESYREL ) tablet 50 mg  50 mg Oral QHS PRN Millington, Matthew E, PA-C   50 mg at 10/11/23 2123  Lab Results:  No results found for this or any previous visit (from the past 48 hours).   Blood Alcohol level:  Lab Results  Component Value Date   [facility] <15 10/01/2023   ETH <10 01/18/2023    Metabolic Disorder Labs: Lab Results  Component Value Date   HGBA1C 5.6 10/05/2023   MPG 114.02 10/05/2023   MPG 123 04/05/2021   Lab Results  Component Value Date   PROLACTIN 25.5 (H) 12/25/2015   Lab Results  Component Value Date   CHOL 191 10/05/2023   TRIG 193 (H) 10/05/2023   HDL 49 10/05/2023   CHOLHDL 3.9 10/05/2023   VLDL 39 10/05/2023   LDLCALC 103 (H) 10/05/2023   LDLCALC 86 04/05/2021      Psychiatric Specialty Exam:  Presentation  General Appearance:  Disheveled  Eye Contact: Fair  Speech: Normal Rate  Speech Volume: Normal    Mood and Affect  Mood: Depressed  Affect: Flat   Thought Process  Thought Processes: Disorganized  Descriptions of Associations:Loose  Orientation:Full (Time, Place and Person)  Thought Content:Delusions  Hallucinations:Hallucinations: -- (denies)    Ideas of Reference:Delusions  Suicidal Thoughts:Suicidal Thoughts: No    Homicidal Thoughts:Homicidal Thoughts: No     Sensorium  Memory: Recent Poor; Remote  Fair  Judgment: Poor  Insight: Poor   Executive Functions  Concentration: Poor  Attention Span: Poor  Recall: Good  Fund of Knowledge: Good  Language: Fair   Psychomotor Activity  Psychomotor Activity: Psychomotor Activity: Normal   Musculoskeletal: Strength & Muscle Tone: within normal limits Gait & Station: normal Assets  Assets: Social Support    Physical Exam: Physical Exam Vitals and nursing note reviewed.  HENT:     Head: Atraumatic.   Eyes:     Extraocular Movements: Extraocular movements intact.   Pulmonary:     Effort: Pulmonary effort is normal.   Neurological:     Mental Status: He is alert.    Review of Systems  Psychiatric/Behavioral:  Positive for hallucinations. Negative for depression, substance abuse and suicidal ideas. The patient is not nervous/anxious and does not have insomnia.    Blood pressure (!) 126/91, pulse 71, temperature 98 F (36.7 C), resp. rate 16, height 5' 6 (1.676 m), weight 56.2 kg, SpO2 100%. Body mass index is 20.01 kg/m.  Diagnosis: Principal Problem:   Psychosis (HCC)   PLAN: Safety and Monitoring:  -- Voluntary admission to inpatient psychiatric unit for safety, stabilization and treatment  -- Daily contact with patient to assess and evaluate symptoms and progress in treatment  -- Patient's case to be discussed in multi-disciplinary team meeting  -- Observation Level : q15 minute checks  -- Vital signs:  q12 hours  -- Precautions: suicide, elopement, and assault -- Encouraged patient to participate in unit milieu and in scheduled group therapies   2. Psychiatric Diagnoses and Treatment:  Patient remains psychiatrically impaired and continues to display delusional thinking and poor insight. Despite denial of active hallucinations or paranoia, clinical presentation and staff reports suggest persistent psychotic symptoms. He is not presently expressing suicidal or homicidal ideation and demonstrates  some improvement in engagement with staff. However, minimal symptom reduction has been observed with current antipsychotic regimen.  Working Diagnosis: Schizoaffective Disorder, Bipolar Type (provisional) Rule out Primary Psychotic Disorder  Psychiatric Plan and Interventions: Decrease Zyprexa  to 10 mg PO BID Increase Prolixin  to 5 mg PO BID Plan to uptitrate Prolixin  further as tolerated Continue to monitor for response to medication adjustment Encourage participation in ADLs  and unit-based therapeutics Staff to monitor for signs of internal preoccupation and psychosis Medication education provided to include risks, benefits, and side effects Patient verbalized understanding and agrees to plan of care Patient remains appropriate for the inpatient psychiatric setting for safety, medication management, and symptom stabilization  -- The risks/benefits/side-effects/alternatives to this medication were discussed in detail with the patient and time was given for questions. The patient consents to medication trial.                -- Metabolic profile and EKG monitoring obtained while on an atypical antipsychotic (BMI: Lipid Panel: HbgA1c: QTc:394)             -- Encouraged patient to participate in unit milieu and in scheduled group therapies                            3. Medical Issues Being Addressed:   no acute concerns   4. Discharge Planning:              -- Social work and case management to assist with discharge planning and identification of hospital follow-up needs prior to discharge             -- Estimated LOS: 5-7 days             -- Discharge Concerns: Need to establish a safety plan; Medication compliance and effectiveness             -- Discharge Goals: Return home with outpatient referrals follow ups Hoy CHRISTELLA [place], NP 10/12/2023, 4:23 PM

## 2023-10-12 NOTE — Group Note (Signed)
 Date:  10/12/2023 Time:  11:32 AM  Group Topic/Focus:  Healthy Communication:   The focus of this group is to discuss communication, barriers to communication, as well as healthy ways to communicate with others.    Participation Level:  Did Not Attend   Deitra Clap [facility] 10/12/2023, 11:32 AM

## 2023-10-12 NOTE — Plan of Care (Signed)
  Problem: Activity: Goal: Interest or engagement in activities will improve Outcome: Progressing Goal: Sleeping patterns will improve Outcome: Progressing   Problem: Education: Goal: Knowledge of [place] [facility] Education information/materials will improve Outcome: Progressing Goal: Emotional status will improve Outcome: Progressing

## 2023-10-13 NOTE — Plan of Care (Signed)

## 2023-10-13 NOTE — Group Note (Signed)
 Date:  10/13/2023 Time:  11:11 AM  Group Topic/Focus:  Coping With Mental Health Crisis:   The purpose of this group is to help patients identify strategies for coping with mental health crisis.  Group discusses possible causes of crisis and ways to manage them effectively. Goals Group:   The focus of this group is to help patients establish daily goals to achieve during treatment and discuss how the patient can incorporate goal setting into their daily lives to aide in recovery. Overcoming Stress:   The focus of this group is to define stress and help patients assess their triggers.    Participation Level:  Did Not Attend  Participation Quality:    Affect:    Cognitive:    Insight:   Engagement in Group:    Modes of Intervention:    Additional Comments:    Damonica Chopra L Loghan Subia 10/13/2023, 11:11 AM

## 2023-10-13 NOTE — Plan of Care (Signed)
   Problem: Education: Goal: Knowledge of [facility] Education information/materials will improve Outcome: Progressing Goal: Emotional status will improve Outcome: Progressing Goal: Mental status will improve Outcome: Progressing

## 2023-10-13 NOTE — Progress Notes (Signed)
   10/12/23 2256  Psych Admission Type (Psych Patients Only)  Admission Status Involuntary  Psychosocial Assessment  Patient Complaints None  Eye Contact Brief  Facial Expression Animated  Affect Blunted  Speech Soft  Interaction Minimal  Motor Activity Pacing  Appearance/Hygiene Unremarkable  Behavior Characteristics Cooperative  Mood Pleasant  Aggressive Behavior  Effect No apparent injury  Thought Process  Coherency WDL  Content WDL  Delusions None reported or observed  Perception Hallucinations  Hallucination Auditory  Judgment Impaired  Confusion None  Danger to Self  Current suicidal ideation? Denies  Danger to Others  Danger to Others None reported or observed

## 2023-10-13 NOTE — Progress Notes (Signed)
   10/13/23 0943  Psych Admission Type (Psych Patients Only)  Admission Status Involuntary  Psychosocial Assessment  Patient Complaints None  Eye Contact Brief  Facial Expression Animated  Affect Blunted  Speech Soft  Interaction Minimal  Motor Activity Pacing (appropriate for developmental age; intermittent pacing throughout the day)  Appearance/Hygiene Unremarkable  Behavior Characteristics Cooperative  Mood Pleasant  Thought Process  Coherency WDL  Content WDL  Delusions None reported or observed  Perception Hallucinations  Hallucination Auditory  Judgment Impaired  Confusion None  Danger to Self  Current suicidal ideation? Denies  Danger to Others  Danger to Others None reported or observed

## 2023-10-13 NOTE — Group Note (Signed)
 Date:  10/13/2023 Time:  8:44 PM  Group Topic/Focus:  Wrap-Up Group:   The focus of this group is to help patients review their daily goal of treatment and discuss progress on daily workbooks.    Participation Level:  Active  Participation Quality:  Appropriate and Attentive  Affect:  Appropriate  Cognitive:  Alert  Insight: Appropriate  Engagement in Group:  Engaged  Modes of Intervention:  Discussion  Additional Comments:     Maglione,Belissa Kooy E 10/13/2023, 8:44 PM

## 2023-10-13 NOTE — Progress Notes (Signed)
 [facility] MD Progress Note  10/13/2023 12:34 PM Roger [name]  MRN:  [id number]  The patient is a 33 year old man with chart history of bipolar vs schizoaffective disorder; hx of polysubstance abuse, depression; he presented to ED after, reportedly, his grandmother contacted crisis services due to reported concerns re: patient responding to internal stimuli, not bathing, trespassing on church property, and digging a large hole in the backyard last week with unclear intent. Grandmother has expressed concerns with patient being discharged and, per chart, she is pursuing legal guardianship for patient via APS with hope for long-term placement. Patient was admitted to [facility] unit for psychosis . On exam today patient is disengaged and internally preoccupied.  He requires frequent redirection and is easily redirectable.  He is alert and oriented to location and self.  Indicates he does not know why he is in the hospital when questioned specifically about taking the whole he indicates he wanted to take a swim and hide from the world.  He denies SI HI and AVH however he is observed responding to internal stimuli and has discussion with self during the interview.  He is a poor historian and is unable to meaningfully fully participate in the interview given his internal preoccupations.  He denied substance use UDS was positive for THC.  He denied previous mental health admissions however they are found on chart review.  He endorses some paranoia but does not elaborate.  Per chart review there is a history of prior suicide attempts.  Given current psychosis patient requires ongoing inpatient psychiatric hospitalization for medication management and stabilization due to impaired judgment, danger to self, and danger to others.   Subjective:  Chart reviewed, case discussed in multidisciplinary meeting, patient seen during rounds.   Patient seen today for follow-up psychiatric evaluation.Showing some orientation today as  he is readily able to tell this provider he is in a psychiatric hospital today however continues to state he does not understand why. We review his ideation of being in a dream world and difficulty establishing reality which he agrees is problematic. Discuss current medication changes and rational for which he verbalizes understanding. He denies any medication side effects and there are none noted. He reports mood is fine and appetite is good Patient is noted to be malodorous today. He reports eating well and sleeping well. Encouraged to participate in activities of daily living (ADLs). He denies auditory or visual hallucinations, paranoia, or delusions. However, staff observations indicate the patient appears internally preoccupied at times throughout the day. Patient continues to deny depressive symptoms and suicidal or homicidal ideation. He is not overtly psychotic in behavior or presentation, but remains grossly delusional and lacks insight into his condition.  He provided grandmother's contact information Roger [name], [phone]) for collateral interview.    Sleep: Fair  Appetite:  Fair  Past Psychiatric History: see h&P Family History:  Family History  Problem Relation Age of Onset   Alcohol abuse Father    Social History:  Social History   Substance and Sexual Activity  Alcohol Use Yes   Alcohol/week: 1.0 standard drink of alcohol   Types: 1 Shots of liquor per week     Social History   Substance and Sexual Activity  Drug Use Yes   Types: Amphetamines, Cocaine, Marijuana, Benzodiazepines, MDMA (Ecstacy), Hydrocodone   Comment: only weed currently    Social History   Socioeconomic History   Marital status: Single    Spouse name: Not on file   Number of children:  Not on file   Years of education: Not on file   Highest education level: Not on file  Occupational History   Not on file  Tobacco Use   Smoking status: Former    Current packs/day: 0.50    Average  packs/day: 0.5 packs/day for 10.0 years (5.0 ttl pk-yrs)    Types: Cigarettes   Smokeless tobacco: Former    Quit date: 11/24/2014  Vaping Use   Vaping status: Never Used  Substance and Sexual Activity   Alcohol use: Yes    Alcohol/week: 1.0 standard drink of alcohol    Types: 1 Shots of liquor per week   Drug use: Yes    Types: Amphetamines, Cocaine, Marijuana, Benzodiazepines, MDMA (Ecstacy), Hydrocodone    Comment: only weed currently   Sexual activity: Yes    Birth control/protection: None  Other Topics Concern   Not on file  Social History Narrative   Not on file   Social Drivers of Health   Financial Resource Strain: Not on file  Food Insecurity: No Food Insecurity (10/02/2023)   Hunger Vital Sign    Worried About Running Out of Food in the Last Year: Never true    Ran Out of Food in the Last Year: Never true  Transportation Needs: No Transportation Needs (10/02/2023)   PRAPARE - Administrator, Civil Service (Medical): No    Lack of Transportation (Non-Medical): No  Physical Activity: Not on file  Stress: Not on file  Social Connections: Not on file   Past Medical History:  Past Medical History:  Diagnosis Date   Depression    Headache    Substance abuse (HCC)     Past Surgical History:  Procedure Laterality Date   FEMUR FRACTURE SURGERY      Current Medications: Current Facility-Administered Medications  Medication Dose Route Frequency Provider Last Rate Last Admin   acetaminophen  (TYLENOL ) tablet 650 mg  650 mg Oral Q6H PRN Mardy Legacy, NP   650 mg at 10/13/23 1009   alum & mag hydroxide-simeth (MAALOX/MYLANTA) 200-200-20 MG/5ML suspension 30 mL  30 mL Oral Q4H PRN Mardy Legacy, NP       benztropine  (COGENTIN ) tablet 1 mg  1 mg Oral BID PRN Mardy Legacy, NP       Or   benztropine  mesylate (COGENTIN ) injection 1 mg  1 mg Intramuscular BID PRN Mardy Legacy, NP       benztropine  (COGENTIN ) tablet 1 mg  1 mg Oral BID PRN Cleotilde Hoy HERO, NP       fluPHENAZine  (PROLIXIN ) tablet 5 mg  5 mg Oral BID Cleotilde Hoy HERO, NP   5 mg at 10/13/23 0830   hydrOXYzine  (ATARAX ) tablet 25 mg  25 mg Oral TID PRN Mardy Legacy, NP   25 mg at 10/06/23 2134   magnesium  hydroxide (MILK OF MAGNESIA) suspension 30 mL  30 mL Oral Daily PRN Mardy Legacy, NP       OLANZapine  (ZYPREXA ) injection 10 mg  10 mg Intramuscular TID PRN Mardy Legacy, NP       OLANZapine  (ZYPREXA ) injection 5 mg  5 mg Intramuscular TID PRN Mardy Legacy, NP       OLANZapine  (ZYPREXA ) tablet 10 mg  10 mg Oral BID Cleotilde Hoy HERO, NP   10 mg at 10/13/23 0830   OLANZapine  zydis (ZYPREXA ) disintegrating tablet 5 mg  5 mg Oral TID PRN Mardy Legacy, NP       traZODone  (DESYREL ) tablet 50 mg  50 mg Oral  QHS PRN Millington, Matthew E, PA-C   50 mg at 10/11/23 2123    Lab Results:  No results found for this or any previous visit (from the past 48 hours).   Blood Alcohol level:  Lab Results  Component Value Date   [facility] <15 10/01/2023   ETH <10 01/18/2023    Metabolic Disorder Labs: Lab Results  Component Value Date   HGBA1C 5.6 10/05/2023   MPG 114.02 10/05/2023   MPG 123 04/05/2021   Lab Results  Component Value Date   PROLACTIN 25.5 (H) 12/25/2015   Lab Results  Component Value Date   CHOL 191 10/05/2023   TRIG 193 (H) 10/05/2023   HDL 49 10/05/2023   CHOLHDL 3.9 10/05/2023   VLDL 39 10/05/2023   LDLCALC 103 (H) 10/05/2023   LDLCALC 86 04/05/2021      Psychiatric Specialty Exam:  Presentation  General Appearance:  Disheveled  Eye Contact: Fair  Speech: Normal Rate  Speech Volume: Normal    Mood and Affect  Mood: Depressed  Affect: Flat   Thought Process  Thought Processes: Disorganized  Descriptions of Associations:Loose  Orientation:Full (Time, Place and Person)  Thought Content:Delusions  Hallucinations:Hallucinations: -- (denies)    Ideas of Reference:Delusions  Suicidal  Thoughts:Suicidal Thoughts: No    Homicidal Thoughts:Homicidal Thoughts: No     Sensorium  Memory: Recent Poor; Remote Fair  Judgment: Poor  Insight: Poor   Executive Functions  Concentration: Poor  Attention Span: Poor  Recall: Good  Fund of Knowledge: Good  Language: Fair   Psychomotor Activity  Psychomotor Activity: Psychomotor Activity: Normal   Musculoskeletal: Strength & Muscle Tone: within normal limits Gait & Station: normal Assets  Assets: Social Support    Physical Exam: Physical Exam Vitals and nursing note reviewed.  HENT:     Head: Atraumatic.   Eyes:     Extraocular Movements: Extraocular movements intact.   Pulmonary:     Effort: Pulmonary effort is normal.   Neurological:     Mental Status: He is alert.    Review of Systems  Psychiatric/Behavioral:  Positive for hallucinations. Negative for depression, substance abuse and suicidal ideas. The patient is not nervous/anxious and does not have insomnia.    Blood pressure 121/76, pulse 76, temperature 97.7 F (36.5 C), resp. rate 18, height 5' 6 (1.676 m), weight 56.2 kg, SpO2 99%. Body mass index is 20.01 kg/m.  Diagnosis: Principal Problem:   Psychosis (HCC)   PLAN: Safety and Monitoring:  -- Voluntary admission to inpatient psychiatric unit for safety, stabilization and treatment  -- Daily contact with patient to assess and evaluate symptoms and progress in treatment  -- Patient's case to be discussed in multi-disciplinary team meeting  -- Observation Level : q15 minute checks  -- Vital signs:  q12 hours  -- Precautions: suicide, elopement, and assault -- Encouraged patient to participate in unit milieu and in scheduled group therapies   2. Psychiatric Diagnoses and Treatment:  Patient remains psychiatrically impaired and continues to display delusional thinking and poor insight. Despite denial of active hallucinations or paranoia, clinical presentation and staff  reports suggest persistent psychotic symptoms. He is not presently expressing suicidal or homicidal ideation and demonstrates some improvement in engagement with staff. However, minimal symptom reduction has been observed with current antipsychotic regimen.  Working Diagnosis: Schizoaffective Disorder, Bipolar Type (provisional) Rule out Primary Psychotic Disorder  Psychiatric Plan and Interventions: Decrease Zyprexa  to 10 mg PO BID Increase Prolixin  to 5 mg PO BID Plan to uptitrate Prolixin   further as tolerated Continue to monitor for response to medication adjustment Encourage participation in ADLs and unit-based therapeutics Staff to monitor for signs of internal preoccupation and psychosis Medication education provided to include risks, benefits, and side effects Patient verbalized understanding and agrees to plan of care Patient remains appropriate for the inpatient psychiatric setting for safety, medication management, and symptom stabilization  -- The risks/benefits/side-effects/alternatives to this medication were discussed in detail with the patient and time was given for questions. The patient consents to medication trial.                -- Metabolic profile and EKG monitoring obtained while on an atypical antipsychotic (BMI: Lipid Panel: HbgA1c: QTc:394)             -- Encouraged patient to participate in unit milieu and in scheduled group therapies                            3. Medical Issues Being Addressed:   no acute concerns   4. Discharge Planning:              -- Social work and case management to assist with discharge planning and identification of hospital follow-up needs prior to discharge             -- Estimated LOS: 5-7 days             -- Discharge Concerns: Need to establish a safety plan; Medication compliance and effectiveness             -- Discharge Goals: Return home with outpatient referrals follow ups Hoy CHRISTELLA Pinal, NP 10/13/2023, 12:34 PM

## 2023-10-13 NOTE — Group Note (Signed)
 Date:  10/13/2023 Time:  2:56 PM  Group Topic/Focus:  Self Care:   The focus of this group is to help patients understand the importance of self-care in order to improve or restore emotional, physical, spiritual, interpersonal, and financial health. Wellness Toolbox:   The focus of this group is to discuss various aspects of wellness, balancing those aspects and exploring ways to increase the ability to experience wellness.  Patients will create a wellness toolbox for use upon discharge.    Participation Level:  Active  Participation Quality:  Appropriate  Affect:  Appropriate  Cognitive:  Appropriate  Insight: Appropriate  Engagement in Group:  Engaged  Modes of Intervention:  Activity  Additional Comments:    [name] L [name] 10/13/2023, 2:56 PM

## 2023-10-13 NOTE — BH IP Treatment Plan (Signed)
 Interdisciplinary Treatment and Diagnostic Plan Update  10/13/2023 Time of Session: 9:45 am Merril Nagy MRN: [id number]  Principal Diagnosis: Psychosis [facility])  Secondary Diagnoses: Principal Problem:   Psychosis (HCC)   Current Medications:  Current Facility-Administered Medications  Medication Dose Route Frequency Provider Last Rate Last Admin   acetaminophen  (TYLENOL ) tablet 650 mg  650 mg Oral Q6H PRN Mardy Legacy, NP   650 mg at 10/10/23 1011   alum & mag hydroxide-simeth (MAALOX/MYLANTA) 200-200-20 MG/5ML suspension 30 mL  30 mL Oral Q4H PRN Mardy Legacy, NP       benztropine  (COGENTIN ) tablet 1 mg  1 mg Oral BID PRN Mardy Legacy, NP       Or   benztropine  mesylate (COGENTIN ) injection 1 mg  1 mg Intramuscular BID PRN Mardy Legacy, NP       benztropine  (COGENTIN ) tablet 1 mg  1 mg Oral BID PRN Cleotilde Hoy HERO, NP       fluPHENAZine  (PROLIXIN ) tablet 5 mg  5 mg Oral BID Cleotilde Hoy HERO, NP   5 mg at 10/13/23 0830   hydrOXYzine  (ATARAX ) tablet 25 mg  25 mg Oral TID PRN Mardy Legacy, NP   25 mg at 10/06/23 2134   magnesium  hydroxide (MILK OF MAGNESIA) suspension 30 mL  30 mL Oral Daily PRN Mardy Legacy, NP       OLANZapine  (ZYPREXA ) injection 10 mg  10 mg Intramuscular TID PRN Mardy Legacy, NP       OLANZapine  (ZYPREXA ) injection 5 mg  5 mg Intramuscular TID PRN Mardy Legacy, NP       OLANZapine  (ZYPREXA ) tablet 10 mg  10 mg Oral BID Cleotilde Hoy HERO, NP   10 mg at 10/13/23 0830   OLANZapine  zydis (ZYPREXA ) disintegrating tablet 5 mg  5 mg Oral TID PRN Mardy Legacy, NP       traZODone  (DESYREL ) tablet 50 mg  50 mg Oral QHS PRN Millington, Matthew E, PA-C   50 mg at 10/11/23 2123   PTA Medications: No medications prior to admission.    Patient Stressors: Financial difficulties   Medication change or noncompliance   Substance abuse    Patient Strengths: Physical Health   Treatment Modalities: Medication Management, Group  therapy, Case management,  1 to 1 session with clinician, Psychoeducation, Recreational therapy.   Physician Treatment Plan for Primary Diagnosis: Psychosis (HCC) Long Term Goal(s): Improvement in symptoms so as ready for discharge   Short Term Goals: Ability to identify changes in lifestyle to reduce recurrence of condition will improve Ability to verbalize feelings will improve Ability to maintain clinical measurements within normal limits will improve Compliance with prescribed medications will improve  Medication Management: Evaluate patient's response, side effects, and tolerance of medication regimen.  Therapeutic Interventions: 1 to 1 sessions, Unit Group sessions and Medication administration.  Evaluation of Outcomes: Progressing  Physician Treatment Plan for Secondary Diagnosis: Principal Problem:   Psychosis (HCC)  Long Term Goal(s): Improvement in symptoms so as ready for discharge   Short Term Goals: Ability to identify changes in lifestyle to reduce recurrence of condition will improve Ability to verbalize feelings will improve Ability to maintain clinical measurements within normal limits will improve Compliance with prescribed medications will improve     Medication Management: Evaluate patient's response, side effects, and tolerance of medication regimen.  Therapeutic Interventions: 1 to 1 sessions, Unit Group sessions and Medication administration.  Evaluation of Outcomes: Progressing   RN Treatment Plan for Primary Diagnosis: Psychosis (HCC) Long Term Goal(s):  Knowledge of disease and therapeutic regimen to maintain health will improve  Short Term Goals: Ability to remain free from injury will improve, Ability to verbalize frustration and anger appropriately will improve, Ability to demonstrate self-control, Ability to participate in decision making will improve, Ability to verbalize feelings will improve, Ability to identify and develop effective coping  behaviors will improve, and Compliance with prescribed medications will improve  Medication Management: RN will administer medications as ordered by provider, will assess and evaluate patient's response and provide education to patient for prescribed medication. RN will report any adverse and/or side effects to prescribing provider.  Therapeutic Interventions: 1 on 1 counseling sessions, Psychoeducation, Medication administration, Evaluate responses to treatment, Monitor vital signs and CBGs as ordered, Perform/monitor CIWA, COWS, AIMS and Fall Risk screenings as ordered, Perform wound care treatments as ordered.  Evaluation of Outcomes: Progressing   LCSW Treatment Plan for Primary Diagnosis: Psychosis (HCC) Long Term Goal(s): Safe transition to appropriate next level of care at discharge, Engage patient in therapeutic group addressing interpersonal concerns.  Short Term Goals: Engage patient in aftercare planning with referrals and resources, Increase social support, Increase ability to appropriately verbalize feelings, Increase emotional regulation, Facilitate acceptance of mental health diagnosis and concerns, and Increase skills for wellness and recovery  Therapeutic Interventions: Assess for all discharge needs, 1 to 1 time with Social worker, Explore available resources and support systems, Assess for adequacy in community support network, Educate family and significant other(s) on suicide prevention, Complete Psychosocial Assessment, Interpersonal group therapy.  Evaluation of Outcomes: Progressing   Progress in Treatment: Attending groups: No. Participating in groups: No. Taking medication as prescribed: Yes. Toleration medication: Yes. Family/Significant other contact made: No, will contact:  will contact once permission is provided. Patient understands diagnosis: No. Discussing patient identified problems/goals with staff: Yes. Medical problems stabilized or resolved:  Yes. Denies suicidal/homicidal ideation: Yes. Issues/concerns per patient self-inventory: No. Other: None  New problem(s) identified: No, Describe:  none identified.  Update 10/08/2023:  No changes at this time. Update 06/28.25: No changes at this time.    New Short Term/Long Term Goal(s): elimination of symptoms of psychosis, medication management for mood stabilization; elimination of SI thoughts; development of comprehensive mental wellness/sobriety plan.  Update 10/08/2023:  No changes at this time.   Update 06/28.25: No changes at this time.     Patient Goals:  Pt denied any goals for treatment.  Update 10/08/2023:  No changes at this time.  Update 06/28.25: No changes at this time.    Discharge Plan or Barriers: CSW will assist pt with development of an appropriate aftercare/discharge plan. Update 10/08/2023:  Patient's grandmother has reported that the patient can not return to her home.  Unclear at this time where the patient will be discharged too.   Update 06/28.25: No changes at this time.    Reason for Continuation of Hospitalization: Delusions  Hallucinations Medication stabilization   Estimated Length of Stay: 1-7 days  Update 10/08/2023:  TBD Update 06/28.25: TBD   Last 3 [place] Suicide Severity Risk Score: Flowsheet Row Admission (Current) from 10/02/2023 in [facility] INPATIENT BEH[facility]INE ED from 10/01/2023 in [facility] Emergency Department at [facility] ED from 01/18/2023 in [facility] Emergency Department at [facility]  C-SSRS RISK CATEGORY No Risk No Risk No Risk    Last PHQ 2/9 Scores:     No data to display          Scribe for Treatment Team: Roselyn GORMAN Lento, LCSW 10/13/2023 9:41 AM

## 2023-10-14 MED ORDER — FLUPHENAZINE HCL 5 MG PO TABS
5.0000 mg | ORAL_TABLET | Freq: Three times a day (TID) | ORAL | Status: DC
  Administered 2023-10-14 – 2023-10-17 (×8): 5 mg via ORAL
  Filled 2023-10-14 (×10): qty 1

## 2023-10-14 NOTE — Progress Notes (Signed)
 [name] MD Progress Note  10/14/2023 2:01 PM [name]  MRN:  [id number]  The patient is a 33 year old man with chart history of bipolar vs schizoaffective disorder; hx of polysubstance abuse, depression; he presented to ED after, reportedly, his grandmother contacted crisis services due to reported concerns re: patient responding to internal stimuli, not bathing, trespassing on church property, and digging a large hole in the backyard last week with unclear intent. Grandmother has expressed concerns with patient being discharged and, per chart, she is pursuing legal guardianship for patient via APS with hope for long-term placement. Patient was admitted to [facility] unit for psychosis . On exam today patient is disengaged and internally preoccupied.  He requires frequent redirection and is easily redirectable.  He is alert and oriented to location and self.  Indicates he does not know why he is in the hospital when questioned specifically about taking the whole he indicates he wanted to take a swim and hide from the world.  He denies SI HI and AVH however he is observed responding to internal stimuli and has discussion with self during the interview.  He is a poor historian and is unable to meaningfully fully participate in the interview given his internal preoccupations.  He denied substance use UDS was positive for THC.  He denied previous mental health admissions however they are found on chart review.  He endorses some paranoia but does not elaborate.  Per chart review there is a history of prior suicide attempts.  Given current psychosis patient requires ongoing inpatient psychiatric hospitalization for medication management and stabilization due to impaired judgment, danger to self, and danger to others.   Subjective:  Chart reviewed, case discussed in multidisciplinary meeting, patient seen during rounds.    Patient seen today for follow-up psychiatric evaluation. He is noted to be in bed  isolating to room today. He states awareness is in a psychiatric hospital however unsure why. He denies being able to provide any psychiatric history or social history, does not know where he was living or what he was doing prior to admission. He reports eating well and sleeping well. Remains disheveled and malodorous despite staff prompts to shower. Discuss current medication changes and rational for which he verbalizes understanding. He denies any medication side effects and there are none noted. He denies any psychiatric symptoms, denies SI/HI, denies AVH paranoia and delusions.  He is not overtly psychotic in behavior or presentation, but remains grossly delusional and lacks insight into his condition.  Call to grandmother today for collateral,  no answer, no opportunity to leave message     Sleep: Fair  Appetite:  Fair  Past Psychiatric History: see h&P Family History:  Family History  Problem Relation Age of Onset   Alcohol abuse Father    Social History:  Social History   Substance and Sexual Activity  Alcohol Use Yes   Alcohol/week: 1.0 standard drink of alcohol   Types: 1 Shots of liquor per week     Social History   Substance and Sexual Activity  Drug Use Yes   Types: Amphetamines, Cocaine, Marijuana, Benzodiazepines, MDMA (Ecstacy), Hydrocodone   Comment: only weed currently    Social History   Socioeconomic History   Marital status: Single    Spouse name: Not on file   Number of children: Not on file   Years of education: Not on file   Highest education level: Not on file  Occupational History   Not on file  Tobacco  Use   Smoking status: Former    Current packs/day: 0.50    Average packs/day: 0.5 packs/day for 10.0 years (5.0 ttl pk-yrs)    Types: Cigarettes   Smokeless tobacco: Former    Quit date: 11/24/2014  Vaping Use   Vaping status: Never Used  Substance and Sexual Activity   Alcohol use: Yes    Alcohol/week: 1.0 standard drink of alcohol     Types: 1 Shots of liquor per week   Drug use: Yes    Types: Amphetamines, Cocaine, Marijuana, Benzodiazepines, MDMA (Ecstacy), Hydrocodone    Comment: only weed currently   Sexual activity: Yes    Birth control/protection: None  Other Topics Concern   Not on file  Social History Narrative   Not on file   Social Drivers of Health   Financial Resource Strain: Not on file  Food Insecurity: No Food Insecurity (10/02/2023)   Hunger Vital Sign    Worried About Running Out of Food in the Last Year: Never true    Ran Out of Food in the Last Year: Never true  Transportation Needs: No Transportation Needs (10/02/2023)   PRAPARE - Administrator, Civil Service (Medical): No    Lack of Transportation (Non-Medical): No  Physical Activity: Not on file  Stress: Not on file  Social Connections: Not on file   Past Medical History:  Past Medical History:  Diagnosis Date   Depression    Headache    Substance abuse (HCC)     Past Surgical History:  Procedure Laterality Date   FEMUR FRACTURE SURGERY      Current Medications: Current Facility-Administered Medications  Medication Dose Route Frequency Provider Last Rate Last Admin   acetaminophen  (TYLENOL ) tablet 650 mg  650 mg Oral Q6H PRN Mardy Legacy, NP   650 mg at 10/13/23 1009   alum & mag hydroxide-simeth (MAALOX/MYLANTA) 200-200-20 MG/5ML suspension 30 mL  30 mL Oral Q4H PRN Mardy Legacy, NP       benztropine  (COGENTIN ) tablet 1 mg  1 mg Oral BID PRN Mardy Legacy, NP       Or   benztropine  mesylate (COGENTIN ) injection 1 mg  1 mg Intramuscular BID PRN Mardy Legacy, NP       benztropine  (COGENTIN ) tablet 1 mg  1 mg Oral BID PRN Cleotilde Hoy HERO, NP       fluPHENAZine  (PROLIXIN ) tablet 5 mg  5 mg Oral TID Cleotilde Hoy HERO, NP       hydrOXYzine  (ATARAX ) tablet 25 mg  25 mg Oral TID PRN Mardy Legacy, NP   25 mg at 10/06/23 2134   magnesium  hydroxide (MILK OF MAGNESIA) suspension 30 mL  30 mL Oral Daily  PRN Mardy Legacy, NP       OLANZapine  (ZYPREXA ) injection 10 mg  10 mg Intramuscular TID PRN Mardy Legacy, NP       OLANZapine  (ZYPREXA ) injection 5 mg  5 mg Intramuscular TID PRN Mardy Legacy, NP       OLANZapine  (ZYPREXA ) tablet 10 mg  10 mg Oral BID Cleotilde Hoy HERO, NP   10 mg at 10/14/23 9178   OLANZapine  zydis (ZYPREXA ) disintegrating tablet 5 mg  5 mg Oral TID PRN Mardy Legacy, NP       traZODone  (DESYREL ) tablet 50 mg  50 mg Oral QHS PRN Millington, Matthew E, PA-C   50 mg at 10/13/23 2119    Lab Results:  No results found for this or any previous visit (from the past 48  hours).   Blood Alcohol level:  Lab Results  Component Value Date   [facility] <15 10/01/2023   ETH <10 01/18/2023    Metabolic Disorder Labs: Lab Results  Component Value Date   HGBA1C 5.6 10/05/2023   MPG 114.02 10/05/2023   MPG 123 04/05/2021   Lab Results  Component Value Date   PROLACTIN 25.5 (H) 12/25/2015   Lab Results  Component Value Date   CHOL 191 10/05/2023   TRIG 193 (H) 10/05/2023   HDL 49 10/05/2023   CHOLHDL 3.9 10/05/2023   VLDL 39 10/05/2023   LDLCALC 103 (H) 10/05/2023   LDLCALC 86 04/05/2021      Psychiatric Specialty Exam:  Presentation  General Appearance:  Disheveled  Eye Contact: Fair  Speech: Normal Rate  Speech Volume: Normal    Mood and Affect  Mood: Depressed  Affect: Flat   Thought Process  Thought Processes: Disorganized  Descriptions of Associations:Loose  Orientation:Full (Time, Place and Person)  Thought Content:Illogical  Hallucinations:Hallucinations: None     Ideas of Reference:Other (comment) (delusions)  Suicidal Thoughts:Suicidal Thoughts: No     Homicidal Thoughts:Homicidal Thoughts: No      Sensorium  Memory: Recent Poor; Remote Fair  Judgment: Poor  Insight: Poor   Executive Functions  Concentration: Fair  Attention Span: Poor  Recall: Good  Fund of  Knowledge: Good  Language: Fair   Psychomotor Activity  Psychomotor Activity: Psychomotor Activity: Normal    Musculoskeletal: Strength & Muscle Tone: within normal limits Gait & Station: normal Assets  Assets: Social Support    Physical Exam: Physical Exam Vitals and nursing note reviewed.  HENT:     Head: Atraumatic.   Eyes:     Extraocular Movements: Extraocular movements intact.   Pulmonary:     Effort: Pulmonary effort is normal.   Neurological:     Mental Status: He is alert.     Blood pressure 113/71, pulse 68, temperature 98.6 F (37 C), resp. rate 19, height 5' 6 (1.676 m), weight 56.2 kg, SpO2 98%. Body mass index is 20.01 kg/m.  Diagnosis: Principal Problem:   Psychosis (HCC)   PLAN: Safety and Monitoring:  -- Voluntary admission to inpatient psychiatric unit for safety, stabilization and treatment  -- Daily contact with patient to assess and evaluate symptoms and progress in treatment  -- Patient's case to be discussed in multi-disciplinary team meeting  -- Observation Level : q15 minute checks  -- Vital signs:  q12 hours  -- Precautions: suicide, elopement, and assault -- Encouraged patient to participate in unit milieu and in scheduled group therapies   2. Psychiatric Diagnoses and Treatment:  Patient remains psychiatrically impaired and continues to display delusional thinking and poor insight. Despite denial of active hallucinations or paranoia, clinical presentation and staff reports suggest persistent psychotic symptoms. He is not presently expressing suicidal or homicidal ideation and demonstrates some improvement in engagement with staff. However, minimal symptom reduction has been observed with current antipsychotic regimen.  Incomplete response from Zyprexa  has prompted addition of prolixin . Pt tolerating well wo side effect noted. Will increase prolixin  to 5mg  TIS today and monitor for effect.   Working Diagnosis: Schizoaffective  Disorder, Bipolar Type (provisional) Rule out Primary Psychotic Disorder  Psychiatric Plan and Interventions: Decrease Zyprexa  to 10 mg PO BID Increase Prolixin  to 5 mg PO TID Plan to uptitrate Prolixin  further as tolerated Continue to monitor for response to medication adjustment Encourage participation in ADLs and unit-based therapeutics Staff to monitor for signs of internal preoccupation and  psychosis Medication education provided to include risks, benefits, and side effects Patient verbalized understanding and agrees to plan of care Patient remains appropriate for the inpatient psychiatric setting for safety, medication management, and symptom stabilization  -- The risks/benefits/side-effects/alternatives to this medication were discussed in detail with the patient and time was given for questions. The patient consents to medication trial.                -- Metabolic profile and EKG monitoring obtained while on an atypical antipsychotic (BMI: Lipid Panel: HbgA1c: QTc:394)             -- Encouraged patient to participate in unit milieu and in scheduled group therapies                            3. Medical Issues Being Addressed:   no acute concerns   4. Discharge Planning:              -- Social work and case management to assist with discharge planning and identification of hospital follow-up needs prior to discharge             -- Estimated LOS: 5-7 days             -- Discharge Concerns: Need to establish a safety plan; Medication compliance and effectiveness             -- Discharge Goals: Return home with outpatient referrals follow ups Hoy CHRISTELLA [place], NP 10/14/2023, 2:01 PM

## 2023-10-14 NOTE — Progress Notes (Signed)
 Preoccupied and continues to randomly pace the hall with little or no peers engagement.  Reports "I am good;" denies AVH, SI/HI adding "I don't know why I am here."  Informed that medication has been helpful and remains medications compliant.    10/13/23 2200  Psych Admission Type (Psych Patients Only)  Admission Status Involuntary  Psychosocial Assessment  Patient Complaints None  Eye Contact Brief  Facial Expression Animated  Affect Preoccupied  Speech Soft  Interaction Minimal  Motor Activity Pacing  Appearance/Hygiene Unremarkable  Behavior Characteristics Cooperative  Mood Pleasant  Thought Process  Coherency WDL  Content WDL  Delusions None reported or observed  Perception Hallucinations  Hallucination Auditory  Judgment Limited  Confusion None  Danger to Self  Current suicidal ideation? Denies  Danger to Others  Danger to Others None reported or observed    Problem: Education: Goal: Knowledge of [place] [facility] Education information/materials will improve Outcome: Progressing Goal: Emotional status will improve Outcome: Progressing Goal: Mental status will improve Outcome: Progressing Goal: Verbalization of understanding the information provided will improve Outcome: Progressing   Problem: Activity: Goal: Interest or engagement in activities will improve Outcome: Progressing Goal: Sleeping patterns will improve Outcome: Progressing   Problem: Coping: Goal: Ability to verbalize frustrations and anger appropriately will improve Outcome: Progressing Goal: Ability to demonstrate self-control will improve Outcome: Progressing

## 2023-10-14 NOTE — Group Note (Signed)
 Date:  10/14/2023 Time:  9:08 PM  Group Topic/Focus:  Self Care:   The focus of this group is to help patients understand the importance of self-care in order to improve or restore emotional, physical, spiritual, interpersonal, and financial health. Stages of Change:   The focus of this group is to explain the stages of change and help patients identify changes they want to make upon discharge. Wrap-Up Group:   The focus of this group is to help patients review their daily goal of treatment and discuss progress on daily workbooks.    Participation Level:  Did Not Attend   Larrie Leita BRAVO 10/14/2023, 9:08 PM

## 2023-10-14 NOTE — Group Note (Signed)
 LCSW Group Therapy Note   Group Date: 10/14/2023 Start Time: 1300 End Time: 1400   Type of Therapy and Topic:  Group Therapy: Boundaries  Participation Level:  Did Not Attend  Description of Group: This group will address the use of boundaries in their personal lives. Patients will explore why boundaries are important, the difference between healthy and unhealthy boundaries, and negative and postive outcomes of different boundaries and will look at how boundaries can be crossed.  Patients will be encouraged to identify current boundaries in their own lives and identify what kind of boundary is being set. Facilitators will guide patients in utilizing problem-solving interventions to address and correct types boundaries being used and to address when no boundary is being used. Understanding and applying boundaries will be explored and addressed for obtaining and maintaining a balanced life. Patients will be encouraged to explore ways to assertively make their boundaries and needs known to significant others in their lives, using other group members and facilitator for role play, support, and feedback.  Therapeutic Goals:  1.  Patient will identify areas in their life where setting clear boundaries could be  used to improve their life.  2.  Patient will identify signs/triggers that a boundary is not being respected. 3.  Patient will identify two ways to set boundaries in order to achieve balance in  their lives: 4.  Patient will demonstrate ability to communicate their needs and set boundaries  through discussion and/or role plays  Summary of Patient Progress:  Patient did not attend.   Therapeutic Modalities:   Cognitive Behavioral Therapy Solution-Focused Therapy  Alveta CHRISTELLA Kerns, LCSWA 10/14/2023  2:20 PM

## 2023-10-14 NOTE — Plan of Care (Signed)
  Problem: Education: Goal: Knowledge of [facility] Education information/materials will improve Outcome: Progressing Goal: Emotional status will improve Outcome: Progressing   Problem: Activity: Goal: Interest or engagement in activities will improve Outcome: Progressing   Problem: Coping: Goal: Ability to verbalize frustrations and anger appropriately will improve Outcome: Progressing   Problem: Safety: Goal: Periods of time without injury will increase Outcome: Progressing

## 2023-10-14 NOTE — Group Note (Signed)
 Date:  10/14/2023 Time:  11:21 AM  Group Topic/Focus:  Wellness Toolbox:   The focus of this group is to discuss various aspects of wellness, balancing those aspects and exploring ways to increase the ability to experience wellness.  Patients will create a wellness toolbox for use upon discharge.    Participation Level:  Did Not Attend   Deitra Clap [facility] 10/14/2023, 11:21 AM

## 2023-10-14 NOTE — Progress Notes (Signed)
   10/14/23 1200  Psych Admission Type (Psych Patients Only)  Admission Status Involuntary  Psychosocial Assessment  Patient Complaints None  Eye Contact Brief  Facial Expression Animated  Affect Preoccupied  Speech Soft  Interaction Minimal  Motor Activity Pacing  Appearance/Hygiene Unremarkable  Behavior Characteristics Cooperative  Mood Pleasant  Thought Process  Coherency WDL  Content WDL  Delusions None reported or observed  Perception Hallucinations  Hallucination Auditory  Judgment Limited  Confusion None  Danger to Others  Danger to Others None reported or observed     10/14/23 1200  Psych Admission Type (Psych Patients Only)  Admission Status Involuntary  Psychosocial Assessment  Patient Complaints None  Eye Contact Brief  Facial Expression Animated  Affect Preoccupied  Speech Soft  Interaction Minimal  Motor Activity Pacing  Appearance/Hygiene Unremarkable  Behavior Characteristics Cooperative  Mood Pleasant  Thought Process  Coherency WDL  Content WDL  Delusions None reported or observed  Perception Hallucinations  Hallucination Auditory  Judgment Limited  Confusion None  Danger to Others  Danger to Others None reported or observed

## 2023-10-15 DIAGNOSIS — F250 Schizoaffective disorder, bipolar type: Secondary | ICD-10-CM | POA: Diagnosis not present

## 2023-10-15 NOTE — Group Note (Signed)
 Recreation Therapy Group Note   Group Topic:Coping Skills  Group Date: 10/15/2023 Start Time: 1045 End Time: 1130 Facilitators: Celestia Jeoffrey BRAVO, LRT, CTRS Location: Craft Room   Group Description: Mind Map.  Patient was provided a blank template of a diagram with 32 blank boxes in a tiered system, branching from the center (similar to a bubble chart). LRT directed patients to label the middle of the diagram Coping Skills. LRT and patients then came up with 8 different coping skills as examples. Pt were directed to record their coping skills in the 2nd tier boxes closest to the center.  Patients would then share their coping skills with the group as LRT wrote them out. LRT gave a handout of 99 different coping skills at the end of group.   Goal Area(s) Addressed: Patients will be able to define "coping skills". Patient will identify new coping skills.  Patient will increase communication.  Affect/Mood: N/A   Participation Level: Did not attend    Clinical Observations/Individualized Feedback: Patient did not attend group.   Plan: Continue to engage patient in RT group sessions 2-3x/week.   [address] LRT, CTRS 10/15/2023 1:43 PM

## 2023-10-15 NOTE — Group Note (Signed)
 [facility] LCSW Group Therapy Note    Group Date: 10/15/2023 Start Time: 1300 End Time: 1400  Type of Therapy and Topic:  Group Therapy:  Overcoming Obstacles  Participation Level:  [facility] PARTICIPATION LEVEL: Did Not Attend  Mood:  Description of Group:   In this group patients will be encouraged to explore what they see as obstacles to their own wellness and recovery. They will be guided to discuss their thoughts, feelings, and behaviors related to these obstacles. The group will process together ways to cope with barriers, with attention given to specific choices patients can make. Each patient will be challenged to identify changes they are motivated to make in order to overcome their obstacles. This group will be process-oriented, with patients participating in exploration of their own experiences as well as giving and receiving support and challenge from other group members.  Therapeutic Goals: 1. Patient will identify personal and current obstacles as they relate to admission. 2. Patient will identify barriers that currently interfere with their wellness or overcoming obstacles.  3. Patient will identify feelings, thought process and behaviors related to these barriers. 4. Patient will identify two changes they are willing to make to overcome these obstacles:    Summary of Patient Progress   X   Therapeutic Modalities:   Cognitive Behavioral Therapy Solution Focused Therapy Motivational Interviewing Relapse Prevention Therapy   Sherryle JINNY Margo, LCSW

## 2023-10-15 NOTE — Plan of Care (Signed)
  Problem: Education: Goal: Knowledge of [place] [facility] Education information/materials will improve Outcome: Progressing Goal: Mental status will improve Outcome: Progressing   Problem: Activity: Goal: Interest or engagement in activities will improve Outcome: Progressing   Problem: Coping: Goal: Ability to verbalize frustrations and anger appropriately will improve Outcome: Progressing   Problem: Safety: Goal: Periods of time without injury will increase Outcome: Progressing

## 2023-10-15 NOTE — Group Note (Signed)
 Recreation Therapy Group Note   Group Topic:Relaxation  Group Date: 10/15/2023 Start Time: 1530 End Time: 1610 Facilitators: Celestia Jeoffrey BRAVO, LRT, CTRS Location: Craft Room  Group Description: PMR (Progressive Muscle Relaxation). LRT educates patients on what PMR is and the benefits that come from it. Patients are asked to sit with their feet flat on the floor while sitting up and all the way back in their chair, if possible. LRT and pts follow a prompt through a speaker that requires you to tense and release different muscles in their body and focus on their breathing. During session, lights are off and soft music is being played.   Goal Area(s) Addressed:  Patients will be able to describe progressive muscle relaxation.  Patient will practice using relaxation technique. Patient will identify a new coping skill.  Patient will follow multistep directions to reduce anxiety and stress.  Affect/Mood: N/A   Participation Level: Did not attend    Clinical Observations/Individualized Feedback: Patient did not attend group.   Plan: Continue to engage patient in RT group sessions 2-3x/week.   Jeoffrey BRAVO Celestia, LRT, CTRS 10/15/2023 5:21 PM

## 2023-10-15 NOTE — Progress Notes (Signed)
 Patient reports being tired because of the medication.  He is preoccupied and paces the hall randomly.  No interaction with peers; stands at the door when wrap up group was being held.  He denied AVH.  Pt remains care compliant.    10/14/23 2200  Psych Admission Type (Psych Patients Only)  Admission Status Involuntary  Psychosocial Assessment  Patient Complaints None  Eye Contact Brief  Facial Expression Animated  Affect Preoccupied  Speech Soft  Interaction Minimal  Motor Activity Pacing  Appearance/Hygiene Unremarkable  Behavior Characteristics Cooperative  Mood Pleasant  Thought Process  Coherency WDL  Content WDL  Delusions None reported or observed  Perception Hallucinations  Hallucination Auditory  Judgment Limited  Confusion None  Danger to Self  Current suicidal ideation? Denies  Danger to Others  Danger to Others None reported or observed    Problem: Education: Goal: Knowledge of [place] [facility] Education information/materials will improve Outcome: Progressing Goal: Emotional status will improve Outcome: Progressing Goal: Mental status will improve Outcome: Progressing Goal: Verbalization of understanding the information provided will improve Outcome: Progressing   Problem: Activity: Goal: Interest or engagement in activities will improve Outcome: Progressing Goal: Sleeping patterns will improve Outcome: Progressing   Problem: Coping: Goal: Ability to verbalize frustrations and anger appropriately will improve Outcome: Progressing Goal: Ability to demonstrate self-control will improve Outcome: Progressing

## 2023-10-15 NOTE — Group Note (Signed)
 Date:  10/15/2023 Time:  8:28 PM  Group Topic/Focus:  Wellness Toolbox:   The focus of this group is to discuss various aspects of wellness, balancing those aspects and exploring ways to increase the ability to experience wellness.  Patients will create a wellness toolbox for use upon discharge.    Participation Level:  Did Not Attend  Participation Quality:  none  Affect:  none  Cognitive:  none  Insight: None  Engagement in Group:  none  Modes of Intervention:  none  Additional Comments:  none   Kerri Katz 10/15/2023, 8:28 PM

## 2023-10-15 NOTE — Group Note (Signed)
 Date:  10/15/2023 Time:  11:32 AM  Group Topic/Focus:  Developing a Wellness Toolbox:   The focus of this group is to help patients develop a wellness toolbox with skills and strategies to promote recovery upon discharge.    Participation Level:  Did Not Attend  Participation Quality:    Affect:    Cognitive:    Insight:   Engagement in Group:    Modes of Intervention:    Additional Comments:    Ioane Bhola 10/15/2023, 11:32 AM

## 2023-10-15 NOTE — Progress Notes (Signed)
   10/15/23 0950  Psych Admission Type (Psych Patients Only)  Admission Status Involuntary  Psychosocial Assessment  Patient Complaints None  Eye Contact Brief  Facial Expression Animated  Affect Blunted  Speech Soft  Interaction Minimal  Motor Activity Slow  Appearance/Hygiene Unremarkable  Behavior Characteristics Cooperative  Mood Pleasant  Thought Process  Coherency WDL  Content WDL  Delusions None reported or observed  Perception Hallucinations  Hallucination Auditory (appears to RIS)  Judgment Limited  Confusion None  Danger to Self  Current suicidal ideation? Denies  Danger to Others  Danger to Others None reported or observed

## 2023-10-15 NOTE — Progress Notes (Signed)
   10/15/23 2200  Psychosocial Assessment  Eye Contact Brief  Facial Expression Animated  Affect Preoccupied  Speech Soft  Interaction Minimal  Motor Activity Pacing  Appearance/Hygiene Unremarkable  Thought Process  Coherency WDL  Content WDL  Delusions None reported or observed  Perception Hallucinations  Hallucination Auditory  Judgment Limited  Confusion None  Danger to Self  Current suicidal ideation? Denies  Danger to Others  Danger to Others None reported or observed

## 2023-10-15 NOTE — Progress Notes (Signed)
 [facility] MD Progress Note  10/15/2023 10:58 AM Roger [name]  MRN:  [id number]  The patient is a 33 year old man with chart history of bipolar vs schizoaffective disorder; hx of polysubstance abuse, depression; he presented to ED after, reportedly, his grandmother contacted crisis services due to reported concerns re: patient responding to internal stimuli, not bathing, trespassing [place] church property, and digging a large hole in the backyard last week with unclear intent. Grandmother has expressed concerns with patient being discharged and, per chart, she is pursuing legal guardianship for patient via [facility] with hope for long-term placement. Patient was admitted to [facility] for psychosis . On exam today patient is disengaged and internally preoccupied.  He requires frequent redirection and is easily redirectable.  He is alert and oriented to location and self.  Indicates he does not know why he is in the hospital when questioned specifically about taking the whole he indicates he wanted to take a swim and hide from the world.  He denies SI HI and AVH however he is observed responding to internal stimuli and has discussion with self during the interview.  He is a poor historian and is unable to meaningfully fully participate in the interview given his internal preoccupations.  He denied substance use UDS was positive for THC.  He denied previous mental health admissions however they are found on chart review.  He endorses some paranoia but does not elaborate.  Per chart review there is a history of prior suicide attempts.  Given current psychosis patient requires ongoing inpatient psychiatric hospitalization for medication management and stabilization due to impaired judgment, danger to self, and danger to others.   Subjective:  Chart reviewed, case discussed in multidisciplinary meeting, patient seen during rounds.    6/30: Patient seen for psychiatric follow-up.  He is noted to be walking the halls  interacts pleasantly with this provider.  Daily bathing continues to be intermittent.  He is aware of who he is and that he is in a psychiatric facility he continues to not be aware of why he is here though he acknowledges the events prior to admission.  He reports stable appetite and mood.  He reports he is living with his grandmother and plans to return there.  Denies adverse effects of new medications.  Denies any psychiatric symptoms SI, HI, and AVH.  Denies paranoid delusions.  He does not appear overtly manic or psychotic on exam however he continues to lack insight appears that he is nearing baseline.  [facility] has been contacted and came to see the patient we have reached out to follow-up with them.  6/29: Patient seen today for follow-up psychiatric evaluation. He is noted to be in bed isolating to room today. He states awareness is in a psychiatric hospital however unsure why. He denies being able to provide any psychiatric history or social history, does not know where he was living or what he was doing prior to admission. He reports eating well and sleeping well. Remains disheveled and malodorous despite staff prompts to shower. Discuss current medication changes and rational for which he verbalizes understanding. He denies any medication side effects and there are none noted. He denies any psychiatric symptoms, denies SI/HI, denies AVH paranoia and delusions.  He is not overtly psychotic in behavior or presentation, but remains grossly delusional and lacks insight into his condition.  Call to grandmother today for collateral,  no answer, no opportunity to leave message     Sleep: Fair  Appetite:  Fair  Past Psychiatric History: see h&P Family History:  Family History  Problem Relation Age of Onset   Alcohol abuse Father    Social History:  Social History   Substance and Sexual Activity  Alcohol Use Yes   Alcohol/week: 1.0 standard drink of alcohol   Types: 1 Shots of liquor per week      Social History   Substance and Sexual Activity  Drug Use Yes   Types: Amphetamines, Cocaine, Marijuana, Benzodiazepines, MDMA (Ecstacy), Hydrocodone   Comment: only weed currently    Social History   Socioeconomic History   Marital status: Single    Spouse name: Not on file   Number of children: Not on file   Years of education: Not on file   Highest education level: Not on file  Occupational History   Not on file  Tobacco Use   Smoking status: Former    Current packs/day: 0.50    Average packs/day: 0.5 packs/day for 10.0 years (5.0 ttl pk-yrs)    Types: Cigarettes   Smokeless tobacco: Former    Quit date: 11/24/2014  Vaping Use   Vaping status: Never Used  Substance and Sexual Activity   Alcohol use: Yes    Alcohol/week: 1.0 standard drink of alcohol    Types: 1 Shots of liquor per week   Drug use: Yes    Types: Amphetamines, Cocaine, Marijuana, Benzodiazepines, MDMA (Ecstacy), Hydrocodone    Comment: only weed currently   Sexual activity: Yes    Birth control/protection: None  Other Topics Concern   Not on file  Social History Narrative   Not on file   Social Drivers of Health   Financial Resource Strain: Not on file  Food Insecurity: No Food Insecurity (10/02/2023)   Hunger Vital Sign    Worried About Running Out of Food in the Last Year: Never true    Ran Out of Food in the Last Year: Never true  Transportation Needs: No Transportation Needs (10/02/2023)   PRAPARE - Administrator, Civil Service (Medical): No    Lack of Transportation (Non-Medical): No  Physical Activity: Not on file  Stress: Not on file  Social Connections: Not on file   Past Medical History:  Past Medical History:  Diagnosis Date   Depression    Headache    Substance abuse (HCC)     Past Surgical History:  Procedure Laterality Date   FEMUR FRACTURE SURGERY      Current Medications: Current Facility-Administered Medications  Medication Dose Route Frequency  Provider Last Rate Last Admin   acetaminophen  (TYLENOL ) tablet 650 mg  650 mg Oral Q6H PRN Mardy Legacy, NP   650 mg at 10/15/23 1049   alum & mag hydroxide-simeth (MAALOX/MYLANTA) 200-200-20 MG/5ML suspension 30 mL  30 mL Oral Q4H PRN Mardy Legacy, NP       benztropine  (COGENTIN ) tablet 1 mg  1 mg Oral BID PRN Mardy Legacy, NP       Or   benztropine  mesylate (COGENTIN ) injection 1 mg  1 mg Intramuscular BID PRN Mardy Legacy, NP       benztropine  (COGENTIN ) tablet 1 mg  1 mg Oral BID PRN Cleotilde Hoy HERO, NP       fluPHENAZine  (PROLIXIN ) tablet 5 mg  5 mg Oral TID Cleotilde Hoy HERO, NP   5 mg at 10/15/23 0830   hydrOXYzine  (ATARAX ) tablet 25 mg  25 mg Oral TID PRN Mardy Legacy, NP   25 mg at 10/06/23 2134  magnesium  hydroxide (MILK OF MAGNESIA) suspension 30 mL  30 mL Oral Daily PRN Mardy Legacy, NP       OLANZapine  (ZYPREXA ) injection 10 mg  10 mg Intramuscular TID PRN Mardy Legacy, NP       OLANZapine  (ZYPREXA ) injection 5 mg  5 mg Intramuscular TID PRN Mardy Legacy, NP       OLANZapine  (ZYPREXA ) tablet 10 mg  10 mg Oral BID Cleotilde Hoy HERO, NP   10 mg at 10/15/23 0830   OLANZapine  zydis (ZYPREXA ) disintegrating tablet 5 mg  5 mg Oral TID PRN Mardy Legacy, NP       traZODone  (DESYREL ) tablet 50 mg  50 mg Oral QHS PRN Roderic Lammert E, PA-C   50 mg at 10/14/23 2106    Lab Results:  No results found for this or any previous visit (from the past 48 hours).   Blood Alcohol level:  Lab Results  Component Value Date   [name] <15 10/01/2023   ETH <10 01/18/2023    Metabolic Disorder Labs: Lab Results  Component Value Date   HGBA1C 5.6 10/05/2023   MPG 114.02 10/05/2023   MPG 123 04/05/2021   Lab Results  Component Value Date   PROLACTIN 25.5 (H) 12/25/2015   Lab Results  Component Value Date   CHOL 191 10/05/2023   TRIG 193 (H) 10/05/2023   HDL 49 10/05/2023   CHOLHDL 3.9 10/05/2023   VLDL 39 10/05/2023   LDLCALC 103 (H) 10/05/2023    LDLCALC 86 04/05/2021      Psychiatric Specialty Exam:  Presentation  General Appearance:  Disheveled  Eye Contact: Fair  Speech: Normal Rate  Speech Volume: Normal    Mood and Affect  Mood: Depressed  Affect: Flat   Thought Process  Thought Processes: Disorganized  Descriptions of Associations:Loose  Orientation:Full (Time, Place and Person)  Thought Content:Illogical  Hallucinations:Hallucinations: None     Ideas of Reference:Other (comment) (delusions)  Suicidal Thoughts:Suicidal Thoughts: No     Homicidal Thoughts:Homicidal Thoughts: No      Sensorium  Memory: Recent Poor; Remote Fair  Judgment: Poor  Insight: Poor   Executive Functions  Concentration: Fair  Attention Span: Poor  Recall: Good  Fund of Knowledge: Good  Language: Fair   Psychomotor Activity  Psychomotor Activity: Psychomotor Activity: Normal    Musculoskeletal: Strength & Muscle Tone: within normal limits Gait & Station: normal Assets  Assets: Social Support    Physical Exam: Physical Exam Vitals and nursing note reviewed.  HENT:     Head: Atraumatic.   Eyes:     Extraocular Movements: Extraocular movements intact.   Pulmonary:     Effort: Pulmonary effort is normal.   Neurological:     Mental Status: He is alert.     Blood pressure 111/72, pulse (!) 58, temperature 98.2 F (36.8 C), resp. rate 16, height 5' 6 (1.676 m), weight 56.2 kg, SpO2 97%. Body mass index is 20.01 kg/m.  Diagnosis: Principal Problem:   Psychosis (HCC)   PLAN: Safety and Monitoring:  -- Voluntary admission to inpatient psychiatric unit for safety, stabilization and treatment  -- Daily contact with patient to assess and evaluate symptoms and progress in treatment  -- Patient's case to be discussed in multi-disciplinary team meeting  -- Observation Level : q15 minute checks  -- Vital signs:  q12 hours  -- Precautions: suicide, elopement, and  assault -- Encouraged patient to participate in unit milieu and in scheduled group therapies   2. Psychiatric Diagnoses and Treatment:  Incomplete response from Zyprexa  has prompted addition of prolixin . Pt tolerating well wo side effect noted. Will increase prolixin  to 5mg  TIS today and monitor for effect.   Working Diagnosis: Schizoaffective Disorder, Bipolar Type (provisional) Rule out Primary Psychotic Disorder  Psychiatric Plan and Interventions: Decrease Zyprexa  to 10 mg PO BID Increase Prolixin  to 5 mg PO TID Plan to uptitrate Prolixin  further as tolerated Continue to monitor for response to medication adjustment Encourage participation in ADLs and unit-based therapeutics Staff to monitor for signs of internal preoccupation and psychosis Medication education provided to include risks, benefits, and side effects Patient verbalized understanding and agrees to plan of care Patient remains appropriate for the inpatient psychiatric setting for safety, medication management, and symptom stabilization  -- The risks/benefits/side-effects/alternatives to this medication were discussed in detail with the patient and time was given for questions. The patient consents to medication trial.                -- Metabolic profile and EKG monitoring obtained while on an atypical antipsychotic (BMI: Lipid Panel: HbgA1c: QTc:394)             -- Encouraged patient to participate in unit milieu and in scheduled group therapies                            3. Medical Issues Being Addressed:   no acute concerns   4. Discharge Planning:              -- Social work and case management to assist with discharge planning and identification of hospital follow-up needs prior to discharge             -- Estimated LOS: 5-7 days             -- Discharge Concerns: Need to establish a safety plan; Medication compliance and effectiveness             -- Discharge Goals: Return home with outpatient referrals follow  ups Donnice FORBES Right, PA-C 10/15/2023, 10:58 AM

## 2023-10-15 NOTE — Plan of Care (Signed)
  Problem: Education: Goal: Emotional status will improve Outcome: Progressing Goal: Mental status will improve Outcome: Progressing Goal: Verbalization of understanding the information provided will improve Outcome: Progressing   Problem: Activity: Goal: Sleeping patterns will improve Outcome: Progressing   Problem: Coping: Goal: Ability to verbalize frustrations and anger appropriately will improve Outcome: Progressing Note: Patient states he believes his medication is making him sleepy Goal: Ability to demonstrate self-control will improve Outcome: Progressing   Problem: Health Behavior/Discharge Planning: Goal: Compliance with treatment plan for underlying cause of condition will improve Outcome: Progressing   Problem: Activity: Goal: Interest or engagement in activities will improve Outcome: Not Progressing Note: Needs reinforcement education. Patient can be seen pacing the hallways frequently throughout the shift, however he does not typically attend group therapy or interact with peers

## 2023-10-16 DIAGNOSIS — F250 Schizoaffective disorder, bipolar type: Secondary | ICD-10-CM | POA: Diagnosis not present

## 2023-10-16 NOTE — Group Note (Signed)
 Date:  10/16/2023 Time:  8:53 PM  Group Topic/Focus:  Wrap-Up Group:   The focus of this group is to help patients review their daily goal of treatment and discuss progress on daily workbooks. Patients meditated and discussed positive things happening in life currently.    Participation Level:  Minimal  Participation Quality:  Appropriate  Affect:  Appropriate  Cognitive:  Appropriate  Insight: Appropriate  Engagement in Group:  Engaged  Modes of Intervention:  Activity  Additional Comments:    Leigh VEAR Pais 10/16/2023, 8:53 PM

## 2023-10-16 NOTE — Group Note (Signed)
 Recreation Therapy Group Note   Group Topic:Stress Management  Group Date: 10/16/2023 Start Time: 1530 End Time: 1620 Facilitators: Celestia Jeoffrey FORBES ARTICE, CTRS Location: Craft Room  Group Description: Taboo. LRT and patients played the game Taboo. The object of the game is to have peers guess the word up at the top of the card drawn that is in bold, while being sure to not use any of the descriptive words down below it on the same card. If the person attempting to explain the word in bold uses any of the descriptive words down below, they lose their turn, and no one receives that card or point. LRT and patient's took turns being the one to describe the words while the rest of the group tried to guess what they were describing.   Goal Area(s) Addressed: Patient will identify physical symptoms of stress. Patient will identify emotional symptoms of stress. Patient will identify coping skills for stress. Patient will build frustration tolerance skills.  Patient will increase communication.   Affect/Mood: N/A   Participation Level: Did not attend    Clinical Observations/Individualized Feedback: Patient did not attend group.   Plan: Continue to engage patient in RT group sessions 2-3x/week.   Jeoffrey FORBES Celestia, LRT, CTRS 10/16/2023 5:41 PM

## 2023-10-16 NOTE — Progress Notes (Signed)
 [facility] MD Progress Note  10/16/2023 10:50 AM Roger [name]  MRN:  [id number]  The patient is a 33 year old man with chart history of bipolar vs schizoaffective disorder; hx of polysubstance abuse, depression; he presented to ED after, reportedly, his grandmother contacted crisis services due to reported concerns re: patient responding to internal stimuli, not bathing, trespassing on church property, and digging a large hole in the backyard last week with unclear intent. Grandmother has expressed concerns with patient being discharged and, per chart, she is pursuing legal guardianship for patient via APS with hope for long-term placement. Patient was admitted to [facility] unit for psychosis . On exam today patient is disengaged and internally preoccupied.  He requires frequent redirection and is easily redirectable.  He is alert and oriented to location and self.  Indicates he does not know why he is in the hospital when questioned specifically about taking the whole he indicates he wanted to take a swim and hide from the world.  He denies SI HI and AVH however he is observed responding to internal stimuli and has discussion with self during the interview.  He is a poor historian and is unable to meaningfully fully participate in the interview given his internal preoccupations.  He denied substance use UDS was positive for THC.  He denied previous mental health admissions however they are found on chart review.  He endorses some paranoia but does not elaborate.  Per chart review there is a history of prior suicide attempts.  Given current psychosis patient requires ongoing inpatient psychiatric hospitalization for medication management and stabilization due to impaired judgment, danger to self, and danger to others.   Subjective:  Chart reviewed, case discussed in multidisciplinary meeting, patient seen during rounds.   7/1 patient seen for follow-up today they are alert and oriented to self and hospital.   They required as needed trazodone  overnight.  They are not attending group sessions.  Reported auditory hallucinations to nursing staff.  They are not observed to be internally preoccupied or responding to internal stimuli during interview.  Behavior continues to be bizarre.  They continue to demonstrate poor insight.  Hygiene is poor and they are malodorous.  Discussed ADLs and being able to care for self is part of discharge requirement.  Patient continues to demonstrate poor insight into the need for self-care, medication compliant, and outpatient follow-up.  Given lack of insight he continues to require psychiatric hospitalization however he appears to be near psychiatric baseline.  APS has been contacted and we are awaiting results of interview.  He notes some sedation since starting Prolixin  however.  Alert on exam.  Denies SI, HI, and AVH.  6/30: Patient seen for psychiatric follow-up.  He is noted to be walking the halls interacts pleasantly with this provider.  Daily bathing continues to be intermittent.  He is aware of who he is and that he is in a psychiatric facility he continues to not be aware of why he is here though he acknowledges the events prior to admission.  He reports stable appetite and mood.  He reports he is living with his grandmother and plans to return there.  Denies adverse effects of new medications.  Denies any psychiatric symptoms SI, HI, and AVH.  Denies paranoid delusions.  He does not appear overtly manic or psychotic on exam however he continues to lack insight appears that he is nearing baseline.  APS has been contacted and came to see the patient we have reached out  to follow-up with them.  6/29: Patient seen today for follow-up psychiatric evaluation. He is noted to be in bed isolating to room today. He states awareness is in a psychiatric hospital however unsure why. He denies being able to provide any psychiatric history or social history, does not know where he was  living or what he was doing prior to admission. He reports eating well and sleeping well. Remains disheveled and malodorous despite staff prompts to shower. Discuss current medication changes and rational for which he verbalizes understanding. He denies any medication side effects and there are none noted. He denies any psychiatric symptoms, denies SI/HI, denies AVH paranoia and delusions.  He is not overtly psychotic in behavior or presentation, but remains grossly delusional and lacks insight into his condition.  Call to grandmother today for collateral,  no answer, no opportunity to leave message     Sleep: Fair  Appetite:  Fair  Past Psychiatric History: see h&P Family History:  Family History  Problem Relation Age of Onset   Alcohol abuse Father    Social History:  Social History   Substance and Sexual Activity  Alcohol Use Yes   Alcohol/week: 1.0 standard drink of alcohol   Types: 1 Shots of liquor per week     Social History   Substance and Sexual Activity  Drug Use Yes   Types: Amphetamines, Cocaine, Marijuana, Benzodiazepines, MDMA (Ecstacy), Hydrocodone   Comment: only weed currently    Social History   Socioeconomic History   Marital status: Single    Spouse name: Not on file   Number of children: Not on file   Years of education: Not on file   Highest education level: Not on file  Occupational History   Not on file  Tobacco Use   Smoking status: Former    Current packs/day: 0.50    Average packs/day: 0.5 packs/day for 10.0 years (5.0 ttl pk-yrs)    Types: Cigarettes   Smokeless tobacco: Former    Quit date: 11/24/2014  Vaping Use   Vaping status: Never Used  Substance and Sexual Activity   Alcohol use: Yes    Alcohol/week: 1.0 standard drink of alcohol    Types: 1 Shots of liquor per week   Drug use: Yes    Types: Amphetamines, Cocaine, Marijuana, Benzodiazepines, MDMA (Ecstacy), Hydrocodone    Comment: only weed currently   Sexual activity: Yes     Birth control/protection: None  Other Topics Concern   Not on file  Social History Narrative   Not on file   Social Drivers of Health   Financial Resource Strain: Not on file  Food Insecurity: No Food Insecurity (10/02/2023)   Hunger Vital Sign    Worried About Running Out of Food in the Last Year: Never true    Ran Out of Food in the Last Year: Never true  Transportation Needs: No Transportation Needs (10/02/2023)   PRAPARE - Administrator, Civil Service (Medical): No    Lack of Transportation (Non-Medical): No  Physical Activity: Not on file  Stress: Not on file  Social Connections: Not on file   Past Medical History:  Past Medical History:  Diagnosis Date   Depression    Headache    Substance abuse (HCC)     Past Surgical History:  Procedure Laterality Date   FEMUR FRACTURE SURGERY      Current Medications: Current Facility-Administered Medications  Medication Dose Route Frequency Provider Last Rate Last Admin   acetaminophen  (TYLENOL ) tablet  650 mg  650 mg Oral Q6H PRN Mardy Legacy, NP   650 mg at 10/15/23 1655   alum & mag hydroxide-simeth (MAALOX/MYLANTA) 200-200-20 MG/5ML suspension 30 mL  30 mL Oral Q4H PRN Mardy Legacy, NP       benztropine  (COGENTIN ) tablet 1 mg  1 mg Oral BID PRN Mardy Legacy, NP       Or   benztropine  mesylate (COGENTIN ) injection 1 mg  1 mg Intramuscular BID PRN Mardy Legacy, NP       benztropine  (COGENTIN ) tablet 1 mg  1 mg Oral BID PRN Cleotilde Hoy HERO, NP       fluPHENAZine  (PROLIXIN ) tablet 5 mg  5 mg Oral TID Cleotilde Hoy HERO, NP   5 mg at 10/16/23 9166   hydrOXYzine  (ATARAX ) tablet 25 mg  25 mg Oral TID PRN Mardy Legacy, NP   25 mg at 10/06/23 2134   magnesium  hydroxide (MILK OF MAGNESIA) suspension 30 mL  30 mL Oral Daily PRN Mardy Legacy, NP       OLANZapine  (ZYPREXA ) injection 10 mg  10 mg Intramuscular TID PRN Mardy Legacy, NP       OLANZapine  (ZYPREXA ) injection 5 mg  5 mg Intramuscular  TID PRN Mardy Legacy, NP       OLANZapine  (ZYPREXA ) tablet 10 mg  10 mg Oral BID Cleotilde Hoy HERO, NP   10 mg at 10/16/23 9166   OLANZapine  zydis (ZYPREXA ) disintegrating tablet 5 mg  5 mg Oral TID PRN Mardy Legacy, NP       traZODone  (DESYREL ) tablet 50 mg  50 mg Oral QHS PRN Mineola Duan E, PA-C   50 mg at 10/15/23 2110    Lab Results:  No results found for this or any previous visit (from the past 48 hours).   Blood Alcohol level:  Lab Results  Component Value Date   [facility] <15 10/01/2023   ETH <10 01/18/2023    Metabolic Disorder Labs: Lab Results  Component Value Date   HGBA1C 5.6 10/05/2023   MPG 114.02 10/05/2023   MPG 123 04/05/2021   Lab Results  Component Value Date   PROLACTIN 25.5 (H) 12/25/2015   Lab Results  Component Value Date   CHOL 191 10/05/2023   TRIG 193 (H) 10/05/2023   HDL 49 10/05/2023   CHOLHDL 3.9 10/05/2023   VLDL 39 10/05/2023   LDLCALC 103 (H) 10/05/2023   LDLCALC 86 04/05/2021      Psychiatric Specialty Exam:  Presentation  General Appearance:  Disheveled  Eye Contact: Fair  Speech: Normal Rate  Speech Volume: Normal    Mood and Affect  Mood: Depressed  Affect: Flat   Thought Process  Thought Processes: Disorganized  Descriptions of Associations:Loose  Orientation:Full (Time, Place and Person)  Thought Content:Illogical  Hallucinations:No data recorded     Ideas of Reference:Other (comment) (delusions)  Suicidal Thoughts:No data recorded     Homicidal Thoughts:No data recorded      Sensorium  Memory: Recent Poor; Remote Fair  Judgment: Poor  Insight: Poor   Executive Functions  Concentration: Fair  Attention Span: Poor  Recall: Good  Fund of Knowledge: Good  Language: Fair   Psychomotor Activity  Psychomotor Activity: No data recorded    Musculoskeletal: Strength & Muscle Tone: within normal limits Gait & Station: normal Assets   Assets: Social Support    Physical Exam: Physical Exam Vitals and nursing note reviewed.  HENT:     Head: Atraumatic.   Eyes:     Extraocular  Movements: Extraocular movements intact.   Pulmonary:     Effort: Pulmonary effort is normal.   Neurological:     Mental Status: He is alert.     Blood pressure 114/76, pulse 65, temperature 97.8 F (36.6 C), resp. rate 20, height 5' 6 (1.676 m), weight 56.2 kg, SpO2 98%. Body mass index is 20.01 kg/m.  Diagnosis: Principal Problem:   Psychosis (HCC)   PLAN: Safety and Monitoring:  -- Voluntary admission to inpatient psychiatric unit for safety, stabilization and treatment  -- Daily contact with patient to assess and evaluate symptoms and progress in treatment  -- Patient's case to be discussed in multi-disciplinary team meeting  -- Observation Level : q15 minute checks  -- Vital signs:  q12 hours  -- Precautions: suicide, elopement, and assault -- Encouraged patient to participate in unit milieu and in scheduled group therapies   2. Psychiatric Diagnoses and Treatment:   Patient had partial response with Zyprexa  second antipsychotic was added to this medication we will continue to monitor.  Psychiatric Plan and Interventions: Continue Zyprexa  to 10 mg PO BID Continue Prolixin  to 5 mg PO TID  -- The risks/benefits/side-effects/alternatives to this medication were discussed in detail with the patient and time was given for questions. The patient consents to medication trial.                -- Metabolic profile and EKG monitoring obtained while on an atypical antipsychotic (BMI: Lipid Panel: HbgA1c: QTc:394)             -- Encouraged patient to participate in unit milieu and in scheduled group therapies                            3. Medical Issues Being Addressed:   no acute concerns   4. Discharge Planning:              -- Social work and case management to assist with discharge planning and identification of hospital  follow-up needs prior to discharge             -- Estimated LOS: 5-7 days             -- Discharge Concerns: Need to establish a safety plan; Medication compliance and effectiveness             -- Discharge Goals: Return home with outpatient referrals follow ups Donnice FORBES Right, PA-C 10/16/2023, 10:50 AM

## 2023-10-16 NOTE — [facility] Counselor (Signed)
 CSW spoke with grandmother, Roger [name] [phone]). She inquired about the progress with pt. CSW informed her that pt was taking his medication and that not much had changed since last contact. CSW inquired how pt was doing when she spoke with him. She stated that he was starting to sound better but she still has concerns about pt not taking his medication. CSW shared that he had spoken with pt about continued treatment on outpatient basis was something that his grandmother would like to see him do. She shared that she hopes that her gets into another program. CSW reiterated that pt was not interested in going to another program and that this was something that he would have to give us  permission to do. CSW stated that pt did appear to be thinking about participating in outpatient. She voiced agreement. No other concerns expressed. Contact ended without incident.   Nadara SAUNDERS. Chaim, MSW, LCSW, LCAS 10/16/2023 3:49 PM

## 2023-10-16 NOTE — Group Note (Signed)
 LCSW Group Therapy Note   Group Date: 10/16/2023 Start Time: 1300 End Time: 1400   Type of Therapy and Topic:  Group Therapy: Challenging Core Beliefs  Participation Level:  Did Not Attend  Description of Group:  Patients were educated about core beliefs and asked to identify one harmful core belief that they have. Patients were asked to explore from where those beliefs originate. Patients were asked to discuss how those beliefs make them feel and the resulting behaviors of those beliefs. They were then be asked if those beliefs are true and, if so, what evidence they have to support them. Lastly, group members were challenged to replace those negative core beliefs with helpful beliefs.   Therapeutic Goals:   1. Patient will identify harmful core beliefs and explore the origins of such beliefs. 2. Patient will identify feelings and behaviors that result from those core beliefs. 3. Patient will discuss whether such beliefs are true. 4.  Patient will replace harmful core beliefs with helpful ones.  Summary of Patient Progress:  Patient did not attend.   Therapeutic Modalities: Cognitive Behavioral Therapy; Solution-Focused Therapy   Britley Gashi M Markon Jares, LCSWA 10/16/2023  2:47 PM

## 2023-10-16 NOTE — Plan of Care (Signed)

## 2023-10-16 NOTE — Progress Notes (Signed)
   10/16/23 0845  Psych Admission Type (Psych Patients Only)  Admission Status Involuntary  Psychosocial Assessment  Patient Complaints Anxiety (patient states he is ready to go)  Eye Contact Brief (patient was found in bed, with the cover over his head upon assessment.)  Facial Expression Flat  Affect Preoccupied (patient is preoccupied with going home)  Speech Logical/coherent  Interaction Assertive  Motor Activity Slow;Pacing  Appearance/Hygiene Body odor;Poor hygiene;In scrubs  Behavior Characteristics Cooperative;Appropriate to situation  Mood Pleasant  Aggressive Behavior  Effect No apparent injury  Thought Process  Coherency WDL  Content WDL  Delusions None reported or observed  Perception Hallucinations  Hallucination Auditory;Visual (patient denies, but is observed talking to himself at times)  Judgment Limited  Confusion None  Danger to Self  Current suicidal ideation? Denies  Danger to Others  Danger to Others None reported or observed   Patient's goal for today, per his self-inventory is to get out of here, in which he will talk to doctor in order to achieve his goal.

## 2023-10-16 NOTE — Progress Notes (Signed)
 Roger [name] is a 33 y.o. male patient. No diagnosis found. Past Medical History:  Diagnosis Date   Depression    Headache    Substance abuse (HCC)    Current Facility-Administered Medications  Medication Dose Route Frequency Provider Last Rate Last Admin   acetaminophen  (TYLENOL ) tablet 650 mg  650 mg Oral Q6H PRN Mardy Legacy, NP   650 mg at 10/15/23 1655   alum & mag hydroxide-simeth (MAALOX/MYLANTA) 200-200-20 MG/5ML suspension 30 mL  30 mL Oral Q4H PRN Mardy Legacy, NP       benztropine  (COGENTIN ) tablet 1 mg  1 mg Oral BID PRN Mardy Legacy, NP       Or   benztropine  mesylate (COGENTIN ) injection 1 mg  1 mg Intramuscular BID PRN Mardy Legacy, NP       benztropine  (COGENTIN ) tablet 1 mg  1 mg Oral BID PRN Cleotilde Hoy HERO, NP       fluPHENAZine  (PROLIXIN ) tablet 5 mg  5 mg Oral TID Cleotilde Hoy HERO, NP   5 mg at 10/16/23 1704   hydrOXYzine  (ATARAX ) tablet 25 mg  25 mg Oral TID PRN Mardy Legacy, NP   25 mg at 10/06/23 2134   magnesium  hydroxide (MILK OF MAGNESIA) suspension 30 mL  30 mL Oral Daily PRN Mardy Legacy, NP       OLANZapine  (ZYPREXA ) injection 10 mg  10 mg Intramuscular TID PRN Mardy Legacy, NP       OLANZapine  (ZYPREXA ) injection 5 mg  5 mg Intramuscular TID PRN Mardy Legacy, NP       OLANZapine  (ZYPREXA ) tablet 10 mg  10 mg Oral BID Cleotilde Hoy HERO, NP   10 mg at 10/16/23 2116   OLANZapine  zydis (ZYPREXA ) disintegrating tablet 5 mg  5 mg Oral TID PRN Mardy Legacy, NP       traZODone  (DESYREL ) tablet 50 mg  50 mg Oral QHS PRN Millington, Matthew E, PA-C   50 mg at 10/16/23 2116   Allergies  Allergen Reactions   Haldol  [Haloperidol  Lactate]     EPS sxs   Principal Problem:   Psychosis (HCC)  Blood pressure 124/88, pulse 95, temperature 97.8 F (36.6 C), resp. rate 20, height 5' 6 (1.676 m), weight 63 kg, SpO2 98%.  Subjective Objective: Vital signs: (most recent): Blood pressure 124/88, pulse 95, temperature  97.8 F (36.6 C), resp. rate 20, height 5' 6 (1.676 m), weight 63 kg, SpO2 98%.     Patient denies SI/ HI and anxiety this shift upon assessment. Patient took meds and is cooperative, he verbalize he will be going home to family. He contracted verbally for safety/  [name] 10/16/2023

## 2023-10-16 NOTE — Group Note (Signed)
 Date:  10/16/2023 Time:  10:55 AM  Group Topic/Focus:  Making Healthy Choices:   The focus of this group is to help patients identify negative/unhealthy choices they were using prior to admission and identify positive/healthier coping strategies to replace them upon discharge.    Participation Level:  Did Not Attend   Deitra Clap [facility] 10/16/2023, 10:55 AM

## 2023-10-17 DIAGNOSIS — F250 Schizoaffective disorder, bipolar type: Secondary | ICD-10-CM | POA: Diagnosis not present

## 2023-10-17 MED ORDER — FLUPHENAZINE HCL 5 MG PO TABS
10.0000 mg | ORAL_TABLET | Freq: Two times a day (BID) | ORAL | Status: DC
Start: 2023-10-17 — End: 2023-10-20
  Administered 2023-10-17 – 2023-10-20 (×6): 10 mg via ORAL
  Filled 2023-10-17 (×7): qty 2

## 2023-10-17 NOTE — Plan of Care (Signed)
  Problem: Education: Goal: Knowledge of [facility] Education information/materials will improve Outcome: Progressing Goal: Emotional status will improve Outcome: Progressing Goal: Mental status will improve Outcome: Progressing Goal: Verbalization of understanding the information provided will improve Outcome: Progressing   Problem: Activity: Goal: Interest or engagement in activities will improve Outcome: Progressing Goal: Sleeping patterns will improve Outcome: Progressing   Problem: Coping: Goal: Ability to verbalize frustrations and anger appropriately will improve Outcome: Progressing Goal: Ability to demonstrate self-control will improve Outcome: Progressing   Problem: Health Behavior/Discharge Planning: Goal: Identification of resources available to assist in meeting health care needs will improve Outcome: Progressing Goal: Compliance with treatment plan for underlying cause of condition will improve Outcome: Progressing   Problem: Physical Regulation: Goal: Ability to maintain clinical measurements within normal limits will improve Outcome: Progressing   Problem: Safety: Goal: Periods of time without injury will increase Outcome: Progressing   Problem: Education: Goal: Emotional status will improve Outcome: Progressing Goal: Mental status will improve Outcome: Progressing Goal: Verbalization of understanding the information provided will improve Outcome: Progressing   Problem: Activity: Goal: Interest or engagement in activities will improve Outcome: Progressing Goal: Sleeping patterns will improve Outcome: Progressing

## 2023-10-17 NOTE — Group Note (Signed)
 [facility] LCSW Group Therapy Note   Group Date: 10/17/2023 Start Time: 1300 End Time: 1330   Type of Therapy/Topic:  Group Therapy:  Emotion Regulation  Participation Level:  Did Not Attend    Description of Group:    The purpose of this group is to assist patients in learning to regulate negative emotions and experience positive emotions. Patients will be guided to discuss ways in which they have been vulnerable to their negative emotions. These vulnerabilities will be juxtaposed with experiences of positive emotions or situations, and patients challenged to use positive emotions to combat negative ones. Special emphasis will be placed on coping with negative emotions in conflict situations, and patients will process healthy conflict resolution skills.  Therapeutic Goals: Patient will identify two positive emotions or experiences to reflect on in order to balance out negative emotions:  Patient will label two or more emotions that they find the most difficult to experience:  Patient will be able to demonstrate positive conflict resolution skills through discussion or role plays:   Summary of Patient Progress: Patient did not attend group.     Therapeutic Modalities:   Cognitive Behavioral Therapy Feelings Identification Dialectical Behavioral Therapy   Nadara JONELLE Fam, LCSW

## 2023-10-17 NOTE — Group Note (Signed)
 Date:  10/17/2023 Time:  10:20 AM  Group Topic/Focus:  Goals Group:   The focus of this group is to help patients establish daily goals to achieve during treatment and discuss how the patient can incorporate goal setting into their daily lives to aide in recovery.    Participation Level:  Did Not Attend    Roger [name] 10/17/2023, 10:20 AM

## 2023-10-17 NOTE — Group Note (Signed)
 Date:  10/17/2023 Time:  10:18 PM  Group Topic/Focus:  Wrap-Up Group:   The focus of this group is to help patients review their daily goal of treatment and discuss progress on daily workbooks.    Participation Level:  Did Not Attend  Additional Comments:    Kristen VEAR Gibbon 10/17/2023, 10:18 PM

## 2023-10-17 NOTE — Progress Notes (Signed)
 Roger [name] is a 33 y.o. male patient. No diagnosis found. Past Medical History:  Diagnosis Date   Depression    Headache    Substance abuse (HCC)    Current Facility-Administered Medications  Medication Dose Route Frequency Provider Last Rate Last Admin   acetaminophen  (TYLENOL ) tablet 650 mg  650 mg Oral Q6H PRN Mardy Legacy, NP   650 mg at 10/15/23 1655   alum & mag hydroxide-simeth (MAALOX/MYLANTA) 200-200-20 MG/5ML suspension 30 mL  30 mL Oral Q4H PRN Mardy Legacy, NP       benztropine  (COGENTIN ) tablet 1 mg  1 mg Oral BID PRN Mardy Legacy, NP       Or   benztropine  mesylate (COGENTIN ) injection 1 mg  1 mg Intramuscular BID PRN Mardy Legacy, NP       benztropine  (COGENTIN ) tablet 1 mg  1 mg Oral BID PRN Cleotilde Hoy HERO, NP       fluPHENAZine  (PROLIXIN ) tablet 10 mg  10 mg Oral BID Millington, Matthew E, PA-C   10 mg at 10/17/23 1735   hydrOXYzine  (ATARAX ) tablet 25 mg  25 mg Oral TID PRN Mardy Legacy, NP   25 mg at 10/06/23 2134   magnesium  hydroxide (MILK OF MAGNESIA) suspension 30 mL  30 mL Oral Daily PRN Mardy Legacy, NP       OLANZapine  (ZYPREXA ) injection 10 mg  10 mg Intramuscular TID PRN Mardy Legacy, NP       OLANZapine  (ZYPREXA ) injection 5 mg  5 mg Intramuscular TID PRN Mardy Legacy, NP       OLANZapine  (ZYPREXA ) tablet 10 mg  10 mg Oral BID Cleotilde Hoy HERO, NP   10 mg at 10/17/23 2122   OLANZapine  zydis (ZYPREXA ) disintegrating tablet 5 mg  5 mg Oral TID PRN Mardy Legacy, NP       traZODone  (DESYREL ) tablet 50 mg  50 mg Oral QHS PRN Millington, Matthew E, PA-C   50 mg at 10/17/23 2122   Allergies  Allergen Reactions   Haldol  [Haloperidol  Lactate]     EPS sxs   Principal Problem:   Psychosis (HCC)  Blood pressure 116/86, pulse 77, temperature 97.7 F (36.5 C), resp. rate 18, height 5' 6 (1.676 m), weight 63 kg, SpO2 100%.  Subjective Objective: Vital signs: (most recent): Blood pressure 116/86, pulse 77,  temperature 97.7 F (36.5 C), resp. rate 18, height 5' 6 (1.676 m), weight 63 kg, SpO2 100%.    Assessment & Plan Patient denies SI and HI this shift. Contracted for safety. Stated grand mom says he can go home after discharge, contrary to what was reported at report. This has to be verified prior to discharge. He was in day room for some time was pacing in and out alot.   Roger [name] B Roger [name] 10/17/2023

## 2023-10-17 NOTE — Group Note (Signed)
 Date:  10/17/2023 Time:  3:30 PM  Group Topic/Focus:  Wellness Toolbox:   The focus of this group is to discuss various aspects of wellness, balancing those aspects and exploring ways to increase the ability to experience wellness.  Patients will create a wellness toolbox for use upon discharge.    Participation Level:  Active  Participation Quality:  Appropriate  Affect:  Appropriate  Cognitive:  Alert  Insight: Appropriate  Engagement in Group:  Engaged  Modes of Intervention:  Activity  Additional Comments:    Roger [name] 10/17/2023, 3:30 PM

## 2023-10-17 NOTE — Progress Notes (Signed)
   10/17/23 0900  Psych Admission Type (Psych Patients Only)  Admission Status Involuntary  Psychosocial Assessment  Patient Complaints None  Eye Contact Fair  Facial Expression Flat  Affect Preoccupied  Speech Logical/coherent  Interaction Assertive  Motor Activity Slow;Pacing  Appearance/Hygiene Body odor;Poor hygiene;In scrubs  Behavior Characteristics Pacing (paces the unit at times)  Mood Pleasant  Aggressive Behavior  Effect No apparent injury  Thought Process  Coherency WDL  Content WDL  Delusions None reported or observed  Perception Hallucinations  Hallucination Auditory;Visual (patient denies, but is observed talking to himself at times)  Judgment Limited  Confusion None  Danger to Self  Current suicidal ideation? Denies  Danger to Others  Danger to Others None reported or observed

## 2023-10-17 NOTE — Progress Notes (Signed)
 [facility] MD Progress Note  10/17/2023 11:10 AM Roger [name]  MRN:  [id number]  The patient is a 33 year old man with chart history of bipolar vs schizoaffective disorder; hx of polysubstance abuse, depression; he presented to ED after, reportedly, his grandmother contacted crisis services due to reported concerns re: patient responding to internal stimuli, not bathing, trespassing on church property, and digging a large hole in the backyard last week with unclear intent. Grandmother has expressed concerns with patient being discharged and, per chart, she is pursuing legal guardianship for patient via APS with hope for long-term placement. Patient was admitted to [facility] unit for psychosis . On exam today patient is disengaged and internally preoccupied.  He requires frequent redirection and is easily redirectable.  He is alert and oriented to location and self.  Indicates he does not know why he is in the hospital when questioned specifically about taking the whole he indicates he wanted to take a swim and hide from the world.  He denies SI HI and AVH however he is observed responding to internal stimuli and has discussion with self during the interview.  He is a poor historian and is unable to meaningfully fully participate in the interview given his internal preoccupations.  He denied substance use UDS was positive for THC.  He denied previous mental health admissions however they are found on chart review.  He endorses some paranoia but does not elaborate.  Per chart review there is a history of prior suicide attempts.  Given current psychosis patient requires ongoing inpatient psychiatric hospitalization for medication management and stabilization due to impaired judgment, danger to self, and danger to others.   Subjective:  Chart reviewed, case discussed in multidisciplinary meeting, patient seen during rounds.   7/2: Patient seen for follow-up today.  They are alert and oriented to self and hospital  they continue to be unaware of why they are in the hospital.  They continue not attending group sessions to continue to isolate and be withdrawn.  They deny hallucinations during interview.  Discussed long-acting injectable Prolixin  and they are hesitant we will revisit in the coming days.  They deny adverse effects of the medication.  They continue to demonstrate poor insight.  They continue to struggle with daily hygiene.  Per rounds yesterday APS plans to come see patient towards the end of the week.  They denied SI, HI, and AVH.  They were not observed to be responding to internal stimuli during the interview today.  7/1 patient seen for follow-up today they are alert and oriented to self and hospital.  They required as needed trazodone  overnight.  They are not attending group sessions.  Reported auditory hallucinations to nursing staff.  They are not observed to be internally preoccupied or responding to internal stimuli during interview.  Behavior continues to be bizarre.  They continue to demonstrate poor insight.  Hygiene is poor and they are malodorous.  Discussed ADLs and being able to care for self is part of discharge requirement.  Patient continues to demonstrate poor insight into the need for self-care, medication compliant, and outpatient follow-up.  Given lack of insight he continues to require psychiatric hospitalization however he appears to be near psychiatric baseline.  APS has been contacted and we are awaiting results of interview.  He notes some sedation since starting Prolixin  however.  Alert on exam.  Denies SI, HI, and AVH.  6/30: Patient seen for psychiatric follow-up.  He is noted to be walking the halls  interacts pleasantly with this provider.  Daily bathing continues to be intermittent.  He is aware of who he is and that he is in a psychiatric facility he continues to not be aware of why he is here though he acknowledges the events prior to admission.  He reports stable appetite and  mood.  He reports he is living with his grandmother and plans to return there.  Denies adverse effects of new medications.  Denies any psychiatric symptoms SI, HI, and AVH.  Denies paranoid delusions.  He does not appear overtly manic or psychotic on exam however he continues to lack insight appears that he is nearing baseline.  APS has been contacted and came to see the patient we have reached out to follow-up with them.  6/29: Patient seen today for follow-up psychiatric evaluation. He is noted to be in bed isolating to room today. He states awareness is in a psychiatric hospital however unsure why. He denies being able to provide any psychiatric history or social history, does not know where he was living or what he was doing prior to admission. He reports eating well and sleeping well. Remains disheveled and malodorous despite staff prompts to shower. Discuss current medication changes and rational for which he verbalizes understanding. He denies any medication side effects and there are none noted. He denies any psychiatric symptoms, denies SI/HI, denies AVH paranoia and delusions.  He is not overtly psychotic in behavior or presentation, but remains grossly delusional and lacks insight into his condition.  Call to grandmother today for collateral,  no answer, no opportunity to leave message     Sleep: Fair  Appetite:  Fair  Past Psychiatric History: see h&P Family History:  Family History  Problem Relation Age of Onset   Alcohol abuse Father    Social History:  Social History   Substance and Sexual Activity  Alcohol Use Yes   Alcohol/week: 1.0 standard drink of alcohol   Types: 1 Shots of liquor per week     Social History   Substance and Sexual Activity  Drug Use Yes   Types: Amphetamines, Cocaine, Marijuana, Benzodiazepines, MDMA (Ecstacy), Hydrocodone   Comment: only weed currently    Social History   Socioeconomic History   Marital status: Single    Spouse name: Not on  file   Number of children: Not on file   Years of education: Not on file   Highest education level: Not on file  Occupational History   Not on file  Tobacco Use   Smoking status: Former    Current packs/day: 0.50    Average packs/day: 0.5 packs/day for 10.0 years (5.0 ttl pk-yrs)    Types: Cigarettes   Smokeless tobacco: Former    Quit date: 11/24/2014  Vaping Use   Vaping status: Never Used  Substance and Sexual Activity   Alcohol use: Yes    Alcohol/week: 1.0 standard drink of alcohol    Types: 1 Shots of liquor per week   Drug use: Yes    Types: Amphetamines, Cocaine, Marijuana, Benzodiazepines, MDMA (Ecstacy), Hydrocodone    Comment: only weed currently   Sexual activity: Yes    Birth control/protection: None  Other Topics Concern   Not on file  Social History Narrative   Not on file   Social Drivers of Health   Financial Resource Strain: Not on file  Food Insecurity: No Food Insecurity (10/02/2023)   Hunger Vital Sign    Worried About Running Out of Food in the Last Year:  Never true    Ran Out of Food in the Last Year: Never true  Transportation Needs: No Transportation Needs (10/02/2023)   PRAPARE - Administrator, Civil Service (Medical): No    Lack of Transportation (Non-Medical): No  Physical Activity: Not on file  Stress: Not on file  Social Connections: Not on file   Past Medical History:  Past Medical History:  Diagnosis Date   Depression    Headache    Substance abuse (HCC)     Past Surgical History:  Procedure Laterality Date   FEMUR FRACTURE SURGERY      Current Medications: Current Facility-Administered Medications  Medication Dose Route Frequency Provider Last Rate Last Admin   acetaminophen  (TYLENOL ) tablet 650 mg  650 mg Oral Q6H PRN Mardy Legacy, NP   650 mg at 10/15/23 1655   alum & mag hydroxide-simeth (MAALOX/MYLANTA) 200-200-20 MG/5ML suspension 30 mL  30 mL Oral Q4H PRN Mardy Legacy, NP       benztropine  (COGENTIN )  tablet 1 mg  1 mg Oral BID PRN Mardy Legacy, NP       Or   benztropine  mesylate (COGENTIN ) injection 1 mg  1 mg Intramuscular BID PRN Mardy Legacy, NP       benztropine  (COGENTIN ) tablet 1 mg  1 mg Oral BID PRN Cleotilde Hoy HERO, NP       fluPHENAZine  (PROLIXIN ) tablet 10 mg  10 mg Oral BID Vada Yellen E, PA-C       hydrOXYzine  (ATARAX ) tablet 25 mg  25 mg Oral TID PRN Mardy Legacy, NP   25 mg at 10/06/23 2134   magnesium  hydroxide (MILK OF MAGNESIA) suspension 30 mL  30 mL Oral Daily PRN Mardy Legacy, NP       OLANZapine  (ZYPREXA ) injection 10 mg  10 mg Intramuscular TID PRN Mardy Legacy, NP       OLANZapine  (ZYPREXA ) injection 5 mg  5 mg Intramuscular TID PRN Mardy Legacy, NP       OLANZapine  (ZYPREXA ) tablet 10 mg  10 mg Oral BID Cleotilde Hoy HERO, NP   10 mg at 10/17/23 0900   OLANZapine  zydis (ZYPREXA ) disintegrating tablet 5 mg  5 mg Oral TID PRN Mardy Legacy, NP       traZODone  (DESYREL ) tablet 50 mg  50 mg Oral QHS PRN Osby Sweetin E, PA-C   50 mg at 10/16/23 2116    Lab Results:  No results found for this or any previous visit (from the past 48 hours).   Blood Alcohol level:  Lab Results  Component Value Date   [facility] <15 10/01/2023   ETH <10 01/18/2023    Metabolic Disorder Labs: Lab Results  Component Value Date   HGBA1C 5.6 10/05/2023   MPG 114.02 10/05/2023   MPG 123 04/05/2021   Lab Results  Component Value Date   PROLACTIN 25.5 (H) 12/25/2015   Lab Results  Component Value Date   CHOL 191 10/05/2023   TRIG 193 (H) 10/05/2023   HDL 49 10/05/2023   CHOLHDL 3.9 10/05/2023   VLDL 39 10/05/2023   LDLCALC 103 (H) 10/05/2023   LDLCALC 86 04/05/2021      Psychiatric Specialty Exam:  Presentation  General Appearance:  Disheveled  Eye Contact: Fair  Speech: Normal Rate  Speech Volume: Normal    Mood and Affect  Mood: Depressed  Affect: Flat   Thought Process  Thought  Processes: Disorganized  Descriptions of Associations:Loose  Orientation:Full (Time, Place and Person)  Thought Content:Illogical  Hallucinations:No data recorded     Ideas of Reference:Other (comment) (delusions)  Suicidal Thoughts:No data recorded     Homicidal Thoughts:No data recorded      Sensorium  Memory: Recent Poor; Remote Fair  Judgment: Poor  Insight: Poor   Executive Functions  Concentration: Fair  Attention Span: Poor  Recall: Good  Fund of Knowledge: Good  Language: Fair   Psychomotor Activity  Psychomotor Activity: No data recorded    Musculoskeletal: Strength & Muscle Tone: within normal limits Gait & Station: normal Assets  Assets: Social Support    Physical Exam: Physical Exam Vitals and nursing note reviewed.  HENT:     Head: Atraumatic.  Eyes:     Extraocular Movements: Extraocular movements intact.  Pulmonary:     Effort: Pulmonary effort is normal.  Neurological:     Mental Status: He is alert.     Blood pressure 107/77, pulse 73, temperature 98.4 F (36.9 C), resp. rate 18, height 5' 6 (1.676 m), weight 63 kg, SpO2 98%. Body mass index is 22.44 kg/m.  Diagnosis: Principal Problem:   Psychosis (HCC)   PLAN: Safety and Monitoring:  -- Voluntary admission to inpatient psychiatric unit for safety, stabilization and treatment  -- Daily contact with patient to assess and evaluate symptoms and progress in treatment  -- Patient's case to be discussed in multi-disciplinary team meeting  -- Observation Level : q15 minute checks  -- Vital signs:  q12 hours  -- Precautions: suicide, elopement, and assault -- Encouraged patient to participate in unit milieu and in scheduled group therapies   2. Psychiatric Diagnoses and Treatment:   Patient had partial response with Zyprexa  second antipsychotic was added to this medication we will continue to monitor.  Psychiatric Plan and Interventions: Continue  Zyprexa  to 10 mg PO BID Continue prolixin  5 mg TID  -- The risks/benefits/side-effects/alternatives to this medication were discussed in detail with the patient and time was given for questions. The patient consents to medication trial.                -- Metabolic profile and EKG monitoring obtained while on an atypical antipsychotic (BMI: Lipid Panel: HbgA1c: QTc:394)             -- Encouraged patient to participate in unit milieu and in scheduled group therapies                            3. Medical Issues Being Addressed:   no acute concerns   4. Discharge Planning:   -- APS coming to see pt             -- Social work and case management to assist with discharge planning and identification of hospital follow-up needs prior to discharge             -- Estimated LOS: 5-7 days             -- Discharge Concerns: Need to establish a safety plan; Medication compliance and effectiveness             -- Discharge Goals: Return home with outpatient referrals follow ups Donnice FORBES Right, PA-C 10/17/2023, 11:10 AM

## 2023-10-18 DIAGNOSIS — F250 Schizoaffective disorder, bipolar type: Secondary | ICD-10-CM | POA: Diagnosis not present

## 2023-10-18 NOTE — Progress Notes (Signed)
 [facility] Psychiatric MD Progress Note  10/18/2023 3:54 PM Roger [name]  MRN:  [id number]  The patient is a 33 year old man with chart history of bipolar vs schizoaffective disorder; hx of polysubstance abuse, depression; he presented to ED after, reportedly, his grandmother contacted crisis services due to reported concerns re: patient responding to internal stimuli, not bathing, trespassing on church property, and digging a large hole in the backyard last week with unclear intent. Grandmother has expressed concerns with patient being discharged and, per chart, she is pursuing legal guardianship for patient via APS with hope for long-term placement. Patient was admitted to [facility] unit for psychosis . On exam today patient is disengaged and internally preoccupied.  He requires frequent redirection and is easily redirectable.  He is alert and oriented to location and self.  Indicates he does not know why he is in the hospital when questioned specifically about taking the whole he indicates he wanted to take a swim and hide from the world.  He denies SI HI and AVH however he is observed responding to internal stimuli and has discussion with self during the interview.  He is a poor historian and is unable to meaningfully fully participate in the interview given his internal preoccupations.  He denied substance use UDS was positive for THC.  He denied previous mental health admissions however they are found on chart review.  He endorses some paranoia but does not elaborate.  Per chart review there is a history of prior suicide attempts.  Given current psychosis patient requires ongoing inpatient psychiatric hospitalization for medication management and stabilization due to impaired judgment, danger to self, and danger to others.   Subjective:  Chart reviewed, case discussed in multidisciplinary meeting, patient seen during rounds.   7/3: They are alert and oriented to self and hospital, continue to demonstrate poor insight  to reason for hospitalization.  They are open to LAI specifically Prolixin  decanoate discussed risk and benefit.  They deny current hallucinations.  They continue to struggle with daily hygiene and ADLs.  They continue to be withdrawn and not engaged with staff or other patients.  They deny SI, HI, and AVH however they do appear internally preoccupied at times.  They were not observed to be responding to internal stimuli during the interview.  7/2: Patient seen for follow-up today.  They are alert and oriented to self and hospital they continue to be unaware of why they are in the hospital.  They continue not attending group sessions to continue to isolate and be withdrawn.  They deny hallucinations during interview.  Discussed long-acting injectable Prolixin  and they are hesitant we will revisit in the coming days.  They deny adverse effects of the medication.  They continue to demonstrate poor insight.  They continue to struggle with daily hygiene.  Per rounds yesterday APS plans to come see patient towards the end of the week.  They denied SI, HI, and AVH.  They were not observed to be responding to internal stimuli during the interview today.  7/1 patient seen for follow-up today they are alert and oriented to self and hospital.  They required as needed trazodone  overnight.  They are not attending group sessions.  Reported auditory hallucinations to nursing staff.  They are not observed to be internally preoccupied or responding to internal stimuli during interview.  Behavior continues to be bizarre.  They continue to demonstrate poor insight.  Hygiene is poor and they are malodorous.  Discussed ADLs and being able to  care for self is part of discharge requirement.  Patient continues to demonstrate poor insight into the need for self-care, medication compliant, and outpatient follow-up.  Given lack of insight he continues to require psychiatric hospitalization however he appears to be near psychiatric  baseline.  APS has been contacted and we are awaiting results of interview.  He notes some sedation since starting Prolixin  however.  Alert on exam.  Denies SI, HI, and AVH.  6/30: Patient seen for psychiatric follow-up.  He is noted to be walking the halls interacts pleasantly with this provider.  Daily bathing continues to be intermittent.  He is aware of who he is and that he is in a psychiatric facility he continues to not be aware of why he is here though he acknowledges the events prior to admission.  He reports stable appetite and mood.  He reports he is living with his grandmother and plans to return there.  Denies adverse effects of new medications.  Denies any psychiatric symptoms SI, HI, and AVH.  Denies paranoid delusions.  He does not appear overtly manic or psychotic on exam however he continues to lack insight appears that he is nearing baseline.  APS has been contacted and came to see the patient we have reached out to follow-up with them.  6/29: Patient seen today for follow-up psychiatric evaluation. He is noted to be in bed isolating to room today. He states awareness is in a psychiatric hospital however unsure why. He denies being able to provide any psychiatric history or social history, does not know where he was living or what he was doing prior to admission. He reports eating well and sleeping well. Remains disheveled and malodorous despite staff prompts to shower. Discuss current medication changes and rational for which he verbalizes understanding. He denies any medication side effects and there are none noted. He denies any psychiatric symptoms, denies SI/HI, denies AVH paranoia and delusions.  He is not overtly psychotic in behavior or presentation, but remains grossly delusional and lacks insight into his condition.  Call to grandmother today for collateral,  no answer, no opportunity to leave message     Sleep: Fair  Appetite:  Fair  Past Psychiatric History: see  h&P Family History:  Family History  Problem Relation Age of Onset   Alcohol abuse Father    Social History:  Social History   Substance and Sexual Activity  Alcohol Use Yes   Alcohol/week: 1.0 standard drink of alcohol   Types: 1 Shots of liquor per week     Social History   Substance and Sexual Activity  Drug Use Yes   Types: Amphetamines, Cocaine, Marijuana, Benzodiazepines, MDMA (Ecstacy), Hydrocodone   Comment: only weed currently    Social History   Socioeconomic History   Marital status: Single    Spouse name: Not on file   Number of children: Not on file   Years of education: Not on file   Highest education level: Not on file  Occupational History   Not on file  Tobacco Use   Smoking status: Former    Current packs/day: 0.50    Average packs/day: 0.5 packs/day for 10.0 years (5.0 ttl pk-yrs)    Types: Cigarettes   Smokeless tobacco: Former    Quit date: 11/24/2014  Vaping Use   Vaping status: Never Used  Substance and Sexual Activity   Alcohol use: Yes    Alcohol/week: 1.0 standard drink of alcohol    Types: 1 Shots of liquor per week  Drug use: Yes    Types: Amphetamines, Cocaine, Marijuana, Benzodiazepines, MDMA (Ecstacy), Hydrocodone    Comment: only weed currently   Sexual activity: Yes    Birth control/protection: None  Other Topics Concern   Not on file  Social History Narrative   Not on file   Social Drivers of Health   Financial Resource Strain: Not on file  Food Insecurity: No Food Insecurity (10/02/2023)   Hunger Vital Sign    Worried About Running Out of Food in the Last Year: Never true    Ran Out of Food in the Last Year: Never true  Transportation Needs: No Transportation Needs (10/02/2023)   PRAPARE - Administrator, Civil Service (Medical): No    Lack of Transportation (Non-Medical): No  Physical Activity: Not on file  Stress: Not on file  Social Connections: Not on file   Past Medical History:  Past Medical  History:  Diagnosis Date   Depression    Headache    Substance abuse (HCC)     Past Surgical History:  Procedure Laterality Date   FEMUR FRACTURE SURGERY      Current Medications: Current Facility-Administered Medications  Medication Dose Route Frequency Provider Last Rate Last Admin   acetaminophen  (TYLENOL ) tablet 650 mg  650 mg Oral Q6H PRN Mardy Legacy, NP   650 mg at 10/18/23 1015   alum & mag hydroxide-simeth (MAALOX/MYLANTA) 200-200-20 MG/5ML suspension 30 mL  30 mL Oral Q4H PRN Mardy Legacy, NP       benztropine  (COGENTIN ) tablet 1 mg  1 mg Oral BID PRN Mardy Legacy, NP       Or   benztropine  mesylate (COGENTIN ) injection 1 mg  1 mg Intramuscular BID PRN Mardy Legacy, NP       benztropine  (COGENTIN ) tablet 1 mg  1 mg Oral BID PRN Cleotilde Hoy HERO, NP       fluPHENAZine  (PROLIXIN ) tablet 10 mg  10 mg Oral BID Camary Sosa E, PA-C   10 mg at 10/18/23 9071   hydrOXYzine  (ATARAX ) tablet 25 mg  25 mg Oral TID PRN Mardy Legacy, NP   25 mg at 10/06/23 2134   magnesium  hydroxide (MILK OF MAGNESIA) suspension 30 mL  30 mL Oral Daily PRN Mardy Legacy, NP       OLANZapine  (ZYPREXA ) injection 10 mg  10 mg Intramuscular TID PRN Mardy Legacy, NP       OLANZapine  (ZYPREXA ) injection 5 mg  5 mg Intramuscular TID PRN Mardy Legacy, NP       OLANZapine  (ZYPREXA ) tablet 10 mg  10 mg Oral BID Cleotilde Hoy HERO, NP   10 mg at 10/18/23 9070   OLANZapine  zydis (ZYPREXA ) disintegrating tablet 5 mg  5 mg Oral TID PRN Mardy Legacy, NP       traZODone  (DESYREL ) tablet 50 mg  50 mg Oral QHS PRN Brylea Pita E, PA-C   50 mg at 10/17/23 2122    Lab Results:  No results found for this or any previous visit (from the past 48 hours).   Blood Alcohol level:  Lab Results  Component Value Date   [facility] <15 10/01/2023   ETH <10 01/18/2023    Metabolic Disorder Labs: Lab Results  Component Value Date   HGBA1C 5.6 10/05/2023   MPG 114.02 10/05/2023    MPG 123 04/05/2021   Lab Results  Component Value Date   PROLACTIN 25.5 (H) 12/25/2015   Lab Results  Component Value Date   CHOL 191 10/05/2023  TRIG 193 (H) 10/05/2023   HDL 49 10/05/2023   CHOLHDL 3.9 10/05/2023   VLDL 39 10/05/2023   LDLCALC 103 (H) 10/05/2023   LDLCALC 86 04/05/2021      Psychiatric Specialty Exam:  Presentation  General Appearance:  Disheveled  Eye Contact: Fair  Speech: Normal Rate  Speech Volume: Normal    Mood and Affect  Mood: Depressed  Affect: Flat   Thought Process  Thought Processes: Disorganized  Descriptions of Associations:Loose  Orientation:Full (Time, Place and Person)  Thought Content:Illogical  Hallucinations:No data recorded     Ideas of Reference:Other (comment) (delusions)  Suicidal Thoughts:No data recorded     Homicidal Thoughts:No data recorded      Sensorium  Memory: Recent Poor; Remote Fair  Judgment: Poor  Insight: Poor   Executive Functions  Concentration: Fair  Attention Span: Poor  Recall: Good  Fund of Knowledge: Good  Language: Fair   Psychomotor Activity  Psychomotor Activity: No data recorded    Musculoskeletal: Strength & Muscle Tone: within normal limits Gait & Station: normal Assets  Assets: Social Support    Physical Exam: Physical Exam Vitals and nursing note reviewed.  HENT:     Head: Atraumatic.  Eyes:     Extraocular Movements: Extraocular movements intact.  Pulmonary:     Effort: Pulmonary effort is normal.  Neurological:     Mental Status: He is alert.     Blood pressure 116/86, pulse 77, temperature 97.7 F (36.5 C), resp. rate 18, height 5' 6 (1.676 m), weight 63 kg, SpO2 100%. Body mass index is 22.44 kg/m.  Diagnosis: Principal Problem:   Psychosis (HCC)   PLAN: Safety and Monitoring:  -- Voluntary admission to inpatient psychiatric unit for safety, stabilization and treatment  -- Daily contact with patient to  assess and evaluate symptoms and progress in treatment  -- Patient's case to be discussed in multi-disciplinary team meeting  -- Observation Level : q15 minute checks  -- Vital signs:  q12 hours  -- Precautions: suicide, elopement, and assault -- Encouraged patient to participate in unit milieu and in scheduled group therapies   2. Psychiatric Diagnoses and Treatment:   Patient had partial response with Zyprexa  second antipsychotic was added to this medication we will continue to monitor.  Psychiatric Plan and Interventions: Continue Zyprexa  to 10 mg PO BID Continue prolixin  5 mg TID  -- The risks/benefits/side-effects/alternatives to this medication were discussed in detail with the patient and time was given for questions. The patient consents to medication trial.                -- Metabolic profile and EKG monitoring obtained while on an atypical antipsychotic (BMI: Lipid Panel: HbgA1c: QTc:394)             -- Encouraged patient to participate in unit milieu and in scheduled group therapies                            3. Medical Issues Being Addressed:   no acute concerns   4. Discharge Planning:   -- APS coming to see pt             -- Social work and case management to assist with discharge planning and identification of hospital follow-up needs prior to discharge             -- Estimated LOS: 5-7 days             --  Discharge Concerns: Need to establish a safety plan; Medication compliance and effectiveness             -- Discharge Goals: Return home with outpatient referrals follow ups Donnice FORBES Right, PA-C 10/18/2023, 3:54 PM

## 2023-10-18 NOTE — [place] FL2 (Signed)
 [place]  MEDICAID FL2 LEVEL OF CARE FORM     IDENTIFICATION  Patient Name: Roger [name] Birthdate: [birth year] Sex: male Admission Date (Current Location): 10/02/2023  [place] and [place] Number:  [place] [id number] Facility and Address:  [facility], [address] [place], [place] [address]      Provider Number: [id number]  Attending Physician Name and Address:  Donnelly Mellow, MD  Relative Name and Phone Number:  [name] Dollar, grandmother, [phone]    Current Level of Care: Hospital Recommended Level of Care: Family Care Home, Other (Comment) (Group home) Prior Approval Number:    Date Approved/Denied:   PASRR Number:    Discharge Plan: Other (Comment) (Family Care Home, Group Home)    Current Diagnoses: Patient Active Problem List   Diagnosis Date Noted   Psychosis (HCC) 10/02/2023   Bipolar affective disorder, currently manic, moderate (HCC) 04/01/2021   Bipolar affective disorder, manic, moderate (HCC) 03/31/2021   Bipolar affective disorder, mixed, severe, with psychotic behavior (HCC) 07/16/2019   Tobacco use disorder 11/25/2014   Alcohol use disorder, severe, dependence (HCC) 11/25/2014   Cannabis use disorder, severe, dependence (HCC) 11/25/2014   Stimulant use disorder (cocaine-amphetamines) 11/25/2014   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   Opioid use disorder, moderate, dependence (HCC) 11/25/2014   Sedative, hypnotic, 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    Orientation RESPIRATION BLADDER Height & Weight     Self, Place  Normal Continent Weight: 63 kg Height:  5' 6 (167.6 cm)  BEHAVIORAL SYMPTOMS/MOOD NEUROLOGICAL BOWEL NUTRITION STATUS  Other (Comment) (Talks to himself, responds to internal stimuli, not taking care of hygiene)  (None) Continent Diet (Regular)  AMBULATORY STATUS COMMUNICATION OF NEEDS Skin   Independent Verbally Normal                       Personal Care Assistance  Level of Assistance   (None)           Functional Limitations Info   (None)          SPECIAL CARE FACTORS FREQUENCY   (None)                    Contractures Contractures Info: Not present    Additional Factors Info  Code Status, Allergies Code Status Info: Full Allergies Info: Haldol            Current Medications (10/18/2023):  This is the current hospital active medication list Current Facility-Administered Medications  Medication Dose Route Frequency Provider Last Rate Last Admin   acetaminophen  (TYLENOL ) tablet 650 mg  650 mg Oral Q6H PRN Mardy Legacy, NP   650 mg at 10/15/23 1655   alum & mag hydroxide-simeth (MAALOX/MYLANTA) 200-200-20 MG/5ML suspension 30 mL  30 mL Oral Q4H PRN Mardy Legacy, NP       benztropine  (COGENTIN ) tablet 1 mg  1 mg Oral BID PRN Mardy Legacy, NP       Or   benztropine  mesylate (COGENTIN ) injection 1 mg  1 mg Intramuscular BID PRN Mardy Legacy, NP       benztropine  (COGENTIN ) tablet 1 mg  1 mg Oral BID PRN Cleotilde Hoy HERO, NP       fluPHENAZine  (PROLIXIN ) tablet 10 mg  10 mg Oral BID Millington, Matthew E, PA-C   10 mg at 10/18/23 9071   hydrOXYzine  (ATARAX ) tablet 25 mg  25 mg Oral TID PRN Mardy Legacy, NP   25 mg at 10/06/23 2134   magnesium  hydroxide (MILK  OF MAGNESIA) suspension 30 mL  30 mL Oral Daily PRN Mardy Legacy, NP       OLANZapine  (ZYPREXA ) injection 10 mg  10 mg Intramuscular TID PRN Mardy Legacy, NP       OLANZapine  (ZYPREXA ) injection 5 mg  5 mg Intramuscular TID PRN Mardy Legacy, NP       OLANZapine  (ZYPREXA ) tablet 10 mg  10 mg Oral BID Cleotilde Hoy HERO, NP   10 mg at 10/18/23 9070   OLANZapine  zydis (ZYPREXA ) disintegrating tablet 5 mg  5 mg Oral TID PRN Mardy Legacy, NP       traZODone  (DESYREL ) tablet 50 mg  50 mg Oral QHS PRN Millington, Matthew E, PA-C   50 mg at 10/17/23 2122     Discharge Medications: Please see discharge summary for a list of discharge  medications.  Relevant Imaging Results:  Relevant Lab Results:   Additional Information    Nadara JONELLE Fam, LCSW

## 2023-10-18 NOTE — Group Note (Signed)
 [facility] LCSW Group Therapy Note   Group Date: 10/18/2023 Start Time: 1300 End Time: 1400   Type of Therapy/Topic:  Group Therapy:  Balance in Life  Participation Level:  Did Not Attend   Description of Group:    This group will address the concept of balance and how it feels and looks when one is unbalanced. Patients will be encouraged to process areas in their lives that are out of balance, and identify reasons for remaining unbalanced. Facilitators will guide patients utilizing problem- solving interventions to address and correct the stressor making their life unbalanced. Understanding and applying boundaries will be explored and addressed for obtaining  and maintaining a balanced life. Patients will be encouraged to explore ways to assertively make their unbalanced needs known to significant others in their lives, using other group members and facilitator for support and feedback.  Therapeutic Goals: Patient will identify two or more emotions or situations they have that consume much of in their lives. Patient will identify signs/triggers that life has become out of balance:  Patient will identify two ways to set boundaries in order to achieve balance in their lives:  Patient will demonstrate ability to communicate their needs through discussion and/or role plays  Summary of Patient Progress: Patient declined to attend group  Therapeutic Modalities:   Cognitive Behavioral Therapy Solution-Focused Therapy Assertiveness Training   Sherryle JINNY Margo, LCSW

## 2023-10-18 NOTE — [facility] Counselor (Addendum)
 CSW again confirmed that he could speak with APS worker. Pt agreed.   CSW contacted APS worker, Harlene Iba to confirm that she was still coming to the unit. She stated that she was on her way.   CSW sat with pt during meeting with APS guardianship worker, Harlene Iba. Meeting went well. Pt appeared to be doing better and was able to answer questions appropriately. No other concerns expressed. Contact ended without incident.   Nadara SAUNDERS. Chaim, MSW, LCSW, LCAS 10/18/2023 4:28 PM

## 2023-10-18 NOTE — Group Note (Signed)
 Recreation Therapy Group Note   Group Topic:Healthy Support Systems  Group Date: 10/18/2023 Start Time: 1530 End Time: 1620 Facilitators: Celestia Jeoffrey FORBES ARTICE, CTRS Location: Craft Room  Group Description: Straw Bridge. In groups or individually, patients were given 10 plastic drinking straws and an equal length of masking tape. Using the materials provided, patients were instructed to build a free-standing bridge-like structure to suspend an everyday item (ex: deck of cards) off the floor or table surface. All materials were required to be used in Secondary school teacher. LRT facilitated post-activity discussion reviewing the importance of having strong and healthy support systems in our lives. LRT discussed how the people in our lives serve as the tape and the deck of cards we placed on top of our straw structure are the stressors we face in daily life. LRT and pts discussed what happens in our life when things get too heavy for us , and we don't have strong supports outside of the hospital. Pt shared 2 of their healthy supports in their life aloud in the group.   Goal Area(s) Addressed:  Patient will identify 2 healthy supports in their life. Patient will identify skills to successfully complete activity. Patient will identify correlation of this activity to life post-discharge.  Patient will build on frustration tolerance skills. Patient will increase team building and communication skills.    Affect/Mood: N/A   Participation Level: Did not attend    Clinical Observations/Individualized Feedback: Patient did not attend group.   Plan: Continue to engage patient in RT group sessions 2-3x/week.   [address], LRT, CTRS 10/18/2023 5:22 PM

## 2023-10-18 NOTE — Group Note (Signed)
 Date:  10/18/2023 Time:  11:09 AM  Group Topic/Focus:  Self Care:   The focus of this group is to help patients understand the importance of self-care in order to improve or restore emotional, physical, spiritual, interpersonal, and financial health.    Participation Level:  Active  Participation Quality:  Appropriate  Affect:  Appropriate  Cognitive:  Appropriate  Insight: Appropriate  Engagement in Group:  Engaged  Modes of Intervention:  Activity  Additional Comments:    Roger [name] Roger [name] 10/18/2023, 11:09 AM

## 2023-10-18 NOTE — [facility] Counselor (Signed)
 Late note:  CSW spoke with APS worker, Harlene Iba [phone]). She shared that they would need a letter for capacity to help with the guardianship process. Iba also requested a FL2 be completed. CSW agreed. Iba endorsed plans to come see pt tomorrow (10/18/23). No other concerns expressed. Contact ended without incident.   Nadara SAUNDERS. Chaim, MSW, LCSW, LCAS 10/18/2023 10:10 AM

## 2023-10-18 NOTE — Group Note (Signed)
 Date:  10/18/2023 Time:  9:35 PM  Group Topic/Focus:  Wrap-Up Group:   The focus of this group is to help patients review their daily goal of treatment and discuss progress on daily workbooks.    Participation Level:  Did Not Attend   Larrie Leita BRAVO 10/18/2023, 9:35 PM

## 2023-10-18 NOTE — BH IP Treatment Plan (Signed)
 Interdisciplinary Treatment and Diagnostic Plan Update  10/18/2023 Time of Session: 2:00PM Roger [name] MRN: [id number]  Principal Diagnosis: Psychosis [facility])  Secondary Diagnoses: Principal Problem:   Psychosis (HCC)   Current Medications:  Current Facility-Administered Medications  Medication Dose Route Frequency Provider Last Rate Last Admin   acetaminophen  (TYLENOL ) tablet 650 mg  650 mg Oral Q6H PRN Mardy Legacy, NP   650 mg at 10/18/23 1015   alum & mag hydroxide-simeth (MAALOX/MYLANTA) 200-200-20 MG/5ML suspension 30 mL  30 mL Oral Q4H PRN Mardy Legacy, NP       benztropine  (COGENTIN ) tablet 1 mg  1 mg Oral BID PRN Mardy Legacy, NP       Or   benztropine  mesylate (COGENTIN ) injection 1 mg  1 mg Intramuscular BID PRN Mardy Legacy, NP       benztropine  (COGENTIN ) tablet 1 mg  1 mg Oral BID PRN Cleotilde Hoy HERO, NP       fluPHENAZine  (PROLIXIN ) tablet 10 mg  10 mg Oral BID Millington, Matthew E, PA-C   10 mg at 10/18/23 9071   hydrOXYzine  (ATARAX ) tablet 25 mg  25 mg Oral TID PRN Mardy Legacy, NP   25 mg at 10/06/23 2134   magnesium  hydroxide (MILK OF MAGNESIA) suspension 30 mL  30 mL Oral Daily PRN Mardy Legacy, NP       OLANZapine  (ZYPREXA ) injection 10 mg  10 mg Intramuscular TID PRN Mardy Legacy, NP       OLANZapine  (ZYPREXA ) injection 5 mg  5 mg Intramuscular TID PRN Mardy Legacy, NP       OLANZapine  (ZYPREXA ) tablet 10 mg  10 mg Oral BID Cleotilde Hoy HERO, NP   10 mg at 10/18/23 9070   OLANZapine  zydis (ZYPREXA ) disintegrating tablet 5 mg  5 mg Oral TID PRN Mardy Legacy, NP       traZODone  (DESYREL ) tablet 50 mg  50 mg Oral QHS PRN Millington, Matthew E, PA-C   50 mg at 10/17/23 2122   PTA Medications: No medications prior to admission.    Patient Stressors: Financial difficulties   Medication change or noncompliance   Substance abuse    Patient Strengths: Physical Health   Treatment Modalities: Medication Management,  Group therapy, Case management,  1 to 1 session with clinician, Psychoeducation, Recreational therapy.   Physician Treatment Plan for Primary Diagnosis: Psychosis (HCC) Long Term Goal(s): Improvement in symptoms so as ready for discharge   Short Term Goals: Ability to identify changes in lifestyle to reduce recurrence of condition will improve Ability to verbalize feelings will improve Ability to maintain clinical measurements within normal limits will improve Compliance with prescribed medications will improve  Medication Management: Evaluate patient's response, side effects, and tolerance of medication regimen.  Therapeutic Interventions: 1 to 1 sessions, Unit Group sessions and Medication administration.  Evaluation of Outcomes: Progressing  Physician Treatment Plan for Secondary Diagnosis: Principal Problem:   Psychosis (HCC)  Long Term Goal(s): Improvement in symptoms so as ready for discharge   Short Term Goals: Ability to identify changes in lifestyle to reduce recurrence of condition will improve Ability to verbalize feelings will improve Ability to maintain clinical measurements within normal limits will improve Compliance with prescribed medications will improve     Medication Management: Evaluate patient's response, side effects, and tolerance of medication regimen.  Therapeutic Interventions: 1 to 1 sessions, Unit Group sessions and Medication administration.  Evaluation of Outcomes: Progressing   RN Treatment Plan for Primary Diagnosis: Psychosis (HCC) Long Term Goal(s): Knowledge  of disease and therapeutic regimen to maintain health will improve  Short Term Goals: Ability to demonstrate self-control, Ability to participate in decision making will improve, Ability to verbalize feelings will improve, Ability to disclose and discuss suicidal ideas, Ability to identify and develop effective coping behaviors will improve, and Compliance with prescribed medications will  improve  Medication Management: RN will administer medications as ordered by provider, will assess and evaluate patient's response and provide education to patient for prescribed medication. RN will report any adverse and/or side effects to prescribing provider.  Therapeutic Interventions: 1 on 1 counseling sessions, Psychoeducation, Medication administration, Evaluate responses to treatment, Monitor vital signs and CBGs as ordered, Perform/monitor CIWA, COWS, AIMS and Fall Risk screenings as ordered, Perform wound care treatments as ordered.  Evaluation of Outcomes: Progressing   LCSW Treatment Plan for Primary Diagnosis: Psychosis (HCC) Long Term Goal(s): Safe transition to appropriate next level of care at discharge, Engage patient in therapeutic group addressing interpersonal concerns.  Short Term Goals: Engage patient in aftercare planning with referrals and resources, Increase social support, Increase ability to appropriately verbalize feelings, Increase emotional regulation, Facilitate acceptance of mental health diagnosis and concerns, and Increase skills for wellness and recovery  Therapeutic Interventions: Assess for all discharge needs, 1 to 1 time with Social worker, Explore available resources and support systems, Assess for adequacy in community support network, Educate family and significant other(s) on suicide prevention, Complete Psychosocial Assessment, Interpersonal group therapy.  Evaluation of Outcomes: Progressing   Progress in Treatment: Attending groups: No. Participating in groups: No. Taking medication as prescribed: Yes. Toleration medication: Yes. Family/Significant other contact made: Yes, individual(s) contacted:  SPE completed with the patient's grandmother.  Patient understands diagnosis: No. Discussing patient identified problems/goals with staff: Yes. Medical problems stabilized or resolved: Yes. Denies suicidal/homicidal ideation: Yes. Issues/concerns  per patient self-inventory: No. Other: none  New problem(s) identified: No, Describe:  none identified.  Update 10/08/2023:  No changes at this time. Update 06/28.25: No changes at this time. Update 10/18/2023: No changes at this time.    New Short Term/Long Term Goal(s): elimination of symptoms of psychosis, medication management for mood stabilization; elimination of SI thoughts; development of comprehensive mental wellness/sobriety plan.  Update 10/08/2023:  No changes at this time.   Update 06/28.25: No changes at this time. Update 10/18/2023: No changes at this time.     Patient Goals:  Pt denied any goals for treatment.  Update 10/08/2023:  No changes at this time.  Update 06/28.25: No changes at this time. Update 10/18/2023: No changes at this time.    Discharge Plan or Barriers: CSW will assist pt with development of an appropriate aftercare/discharge plan. Update 10/08/2023:  Patient's grandmother has reported that the patient can not return to her home.  Unclear at this time where the patient will be discharged too.   Update 06/28.25: No changes at this time. Update 10/18/2023: Patient remains safe on the unit. Unclear at this time where the patient will discharge due. Patient appeared agreeable to a LAI which would support returning to grandmothers home, though LAI has not been given yet. APS remains involved and it is unclear what level of involvement will look like.    Reason for Continuation of Hospitalization: Delusions  Hallucinations Medication stabilization   Estimated Length of Stay: 1-7 days  Update 10/08/2023:  TBD Update 06/28.25: TBD  Update 10/18/2023: TBD  Last 3 [place] Suicide Severity Risk Score: Flowsheet Row Admission (Current) from 10/02/2023 in [facility] ED  from 10/01/2023 in [facility] Department at [facility] ED from 01/18/2023 in [facility] Emergency Department at [facility]  C-SSRS RISK CATEGORY No Risk No Risk No Risk     Last PHQ 2/9 Scores:     No data to display          Scribe for Treatment Team: Sherryle JINNY Margo, KEN 10/18/2023 3:29 PM

## 2023-10-18 NOTE — Plan of Care (Signed)
  Problem: Education: Goal: Emotional status will improve Outcome: Progressing   Problem: Education: Goal: Mental status will improve Outcome: Progressing   Problem: Activity: Goal: Interest or engagement in activities will improve Outcome: Progressing   Problem: Activity: Goal: Sleeping patterns will improve Outcome: Progressing   Problem: Coping: Goal: Ability to demonstrate self-control will improve Outcome: Progressing

## 2023-10-18 NOTE — Plan of Care (Signed)

## 2023-10-18 NOTE — Group Note (Signed)
 Recreation Therapy Group Note   Group Topic:Health and Wellness  Group Date: 10/18/2023 Start Time: 0950 End Time: 1050 Facilitators: Celestia Jeoffrey BRAVO, LRT, CTRS Location: Courtyard  Group Description: [employer]. LRT and patients played games of basketball, drew with chalk, and played corn hole while outside in the courtyard while getting fresh air and sunlight. Music was being played in the background. LRT and peers conversed about different games they have played before, what they do in their free time and anything else that is on their minds. LRT encouraged pts to drink water after being outside, sweating and getting their heart rate up.  Goal Area(s) Addressed: Patient will build on frustration tolerance skills. Patients will partake in a competitive play game with peers. Patients will gain knowledge of new leisure interest/hobby.    Affect/Mood: Appropriate   Participation Level: Minimal    Clinical Observations/Individualized Feedback: Koji came late to group. Pt was present for less than 10 minutes.   Plan: Continue to engage patient in RT group sessions 2-3x/week.   Jeoffrey BRAVO Celestia, LRT, CTRS 10/18/2023 11:08 AM

## 2023-10-19 DIAGNOSIS — F250 Schizoaffective disorder, bipolar type: Secondary | ICD-10-CM | POA: Diagnosis not present

## 2023-10-19 NOTE — Plan of Care (Signed)
   Problem: Education: Goal: Emotional status will improve Outcome: Progressing Goal: Mental status will improve Outcome: Progressing Goal: Verbalization of understanding the information provided will improve Outcome: Progressing   Problem: Activity: Goal: Interest or engagement in activities will improve Outcome: Progressing

## 2023-10-19 NOTE — Group Note (Signed)
 Recreation Therapy Group Note   Group Topic:General Recreation  Group Date: 10/19/2023 Start Time: 1030 End Time: 1130 Facilitators: Celestia Jeoffrey BRAVO, LRT, CTRS Location: Courtyard  Group Description: [employer]. LRT and patients played games of basketball, drew with chalk, and played corn hole while outside in the courtyard while getting fresh air and sunlight. Music was being played in the background. LRT and peers conversed about different games they have played before, what they do in their free time and anything else that is on their minds. LRT encouraged pts to drink water after being outside, sweating and getting their heart rate up.  Goal Area(s) Addressed: Patient will build on frustration tolerance skills. Patients will partake in a competitive play game with peers. Patients will gain knowledge of new leisure interest/hobby.    Affect/Mood: Appropriate   Participation Level: Minimal    Clinical Observations/Individualized Feedback: Rodrigus came late to group. Pt was present for the last 10 minutes.   Plan: Continue to engage patient in RT group sessions 2-3x/week.   Jeoffrey BRAVO Celestia, LRT, CTRS 10/19/2023 11:49 AM

## 2023-10-19 NOTE — Plan of Care (Signed)
  Problem: Education: Goal: Knowledge of [facility] Education information/materials will improve Outcome: Progressing Goal: Emotional status will improve Outcome: Progressing Goal: Mental status will improve Outcome: Progressing Goal: Verbalization of understanding the information provided will improve Outcome: Progressing   Problem: Activity: Goal: Interest or engagement in activities will improve Outcome: Progressing Goal: Sleeping patterns will improve Outcome: Progressing   Problem: Coping: Goal: Ability to verbalize frustrations and anger appropriately will improve Outcome: Progressing Goal: Ability to demonstrate self-control will improve Outcome: Progressing   Problem: Health Behavior/Discharge Planning: Goal: Identification of resources available to assist in meeting health care needs will improve Outcome: Progressing Goal: Compliance with treatment plan for underlying cause of condition will improve Outcome: Progressing   Problem: Physical Regulation: Goal: Ability to maintain clinical measurements within normal limits will improve Outcome: Progressing   Problem: Safety: Goal: Periods of time without injury will increase Outcome: Progressing   Problem: Education: Goal: Emotional status will improve Outcome: Progressing Goal: Mental status will improve Outcome: Progressing Goal: Verbalization of understanding the information provided will improve Outcome: Progressing   Problem: Activity: Goal: Interest or engagement in activities will improve Outcome: Progressing Goal: Sleeping patterns will improve Outcome: Progressing

## 2023-10-19 NOTE — Progress Notes (Signed)
 [facility] MD Progress Note  10/19/2023 3:10 PM Roger [name]  MRN:  [id number]  The patient is a 33 year old man with chart history of bipolar vs schizoaffective disorder; hx of polysubstance abuse, depression; he presented to ED after, reportedly, his grandmother contacted crisis services due to reported concerns re: patient responding to internal stimuli, not bathing, trespassing on church property, and digging a large hole in the backyard last week with unclear intent. Grandmother has expressed concerns with patient being discharged and, per chart, she is pursuing legal guardianship for patient via APS with hope for long-term placement. Patient was admitted to [facility] unit for psychosis . On exam today patient is disengaged and internally preoccupied.  He requires frequent redirection and is easily redirectable.  He is alert and oriented to location and self.  Indicates he does not know why he is in the hospital when questioned specifically about taking the whole he indicates he wanted to take a swim and hide from the world.  He denies SI HI and AVH however he is observed responding to internal stimuli and has discussion with self during the interview.  He is a poor historian and is unable to meaningfully fully participate in the interview given his internal preoccupations.  He denied substance use UDS was positive for THC.  He denied previous mental health admissions however they are found on chart review.  He endorses some paranoia but does not elaborate.  Per chart review there is a history of prior suicide attempts.  Given current psychosis patient requires ongoing inpatient psychiatric hospitalization for medication management and stabilization due to impaired judgment, danger to self, and danger to others.   Subjective:  Chart reviewed, case discussed in multidisciplinary meeting, patient seen during rounds.   7/4 on follow-up there found sitting in the room.  They indicate APS came and visited him  with them yesterday.  They are appropriately oriented on exam today.  Continue to have some paranoia and lack of insight.  They are questioning when they will be discharged.  Discussed that they may be open to taking LAI they want further time to consider.  They continue to have poor insight into the need for outpatient follow-up and medication compliance.  They continue to struggle with daily hygiene and room is malodorous.  They are not agreeable to further medication adjustment at this time.  7/3: They are alert and oriented to self and hospital, continue to demonstrate poor insight to reason for hospitalization.  They are open to LAI specifically Prolixin  decanoate discussed risk and benefit.  They deny current hallucinations.  They continue to struggle with daily hygiene and ADLs.  They continue to be withdrawn and not engaged with staff or other patients.  They deny SI, HI, and AVH however they do appear internally preoccupied at times.  They were not observed to be responding to internal stimuli during the interview.  7/2: Patient seen for follow-up today.  They are alert and oriented to self and hospital they continue to be unaware of why they are in the hospital.  They continue not attending group sessions to continue to isolate and be withdrawn.  They deny hallucinations during interview.  Discussed long-acting injectable Prolixin  and they are hesitant we will revisit in the coming days.  They deny adverse effects of the medication.  They continue to demonstrate poor insight.  They continue to struggle with daily hygiene.  Per rounds yesterday APS plans to come see patient towards the end of the  week.  They denied SI, HI, and AVH.  They were not observed to be responding to internal stimuli during the interview today.  7/1 patient seen for follow-up today they are alert and oriented to self and hospital.  They required as needed trazodone  overnight.  They are not attending group sessions.  Reported  auditory hallucinations to nursing staff.  They are not observed to be internally preoccupied or responding to internal stimuli during interview.  Behavior continues to be bizarre.  They continue to demonstrate poor insight.  Hygiene is poor and they are malodorous.  Discussed ADLs and being able to care for self is part of discharge requirement.  Patient continues to demonstrate poor insight into the need for self-care, medication compliant, and outpatient follow-up.  Given lack of insight he continues to require psychiatric hospitalization however he appears to be near psychiatric baseline.  APS has been contacted and we are awaiting results of interview.  He notes some sedation since starting Prolixin  however.  Alert on exam.  Denies SI, HI, and AVH.  6/30: Patient seen for psychiatric follow-up.  He is noted to be walking the halls interacts pleasantly with this provider.  Daily bathing continues to be intermittent.  He is aware of who he is and that he is in a psychiatric facility he continues to not be aware of why he is here though he acknowledges the events prior to admission.  He reports stable appetite and mood.  He reports he is living with his grandmother and plans to return there.  Denies adverse effects of new medications.  Denies any psychiatric symptoms SI, HI, and AVH.  Denies paranoid delusions.  He does not appear overtly manic or psychotic on exam however he continues to lack insight appears that he is nearing baseline.  APS has been contacted and came to see the patient we have reached out to follow-up with them.  6/29: Patient seen today for follow-up psychiatric evaluation. He is noted to be in bed isolating to room today. He states awareness is in a psychiatric hospital however unsure why. He denies being able to provide any psychiatric history or social history, does not know where he was living or what he was doing prior to admission. He reports eating well and sleeping well. Remains  disheveled and malodorous despite staff prompts to shower. Discuss current medication changes and rational for which he verbalizes understanding. He denies any medication side effects and there are none noted. He denies any psychiatric symptoms, denies SI/HI, denies AVH paranoia and delusions.  He is not overtly psychotic in behavior or presentation, but remains grossly delusional and lacks insight into his condition.  Call to grandmother today for collateral,  no answer, no opportunity to leave message     Sleep: Fair  Appetite:  Fair  Past Psychiatric History: see h&P Family History:  Family History  Problem Relation Age of Onset   Alcohol abuse Father    Social History:  Social History   Substance and Sexual Activity  Alcohol Use Yes   Alcohol/week: 1.0 standard drink of alcohol   Types: 1 Shots of liquor per week     Social History   Substance and Sexual Activity  Drug Use Yes   Types: Amphetamines, Cocaine, Marijuana, Benzodiazepines, MDMA (Ecstacy), Hydrocodone   Comment: only weed currently    Social History   Socioeconomic History   Marital status: Single    Spouse name: Not on file   Number of children: Not on file  Years of education: Not on file   Highest education level: Not on file  Occupational History   Not on file  Tobacco Use   Smoking status: Former    Current packs/day: 0.50    Average packs/day: 0.5 packs/day for 10.0 years (5.0 ttl pk-yrs)    Types: Cigarettes   Smokeless tobacco: Former    Quit date: 11/24/2014  Vaping Use   Vaping status: Never Used  Substance and Sexual Activity   Alcohol use: Yes    Alcohol/week: 1.0 standard drink of alcohol    Types: 1 Shots of liquor per week   Drug use: Yes    Types: Amphetamines, Cocaine, Marijuana, Benzodiazepines, MDMA (Ecstacy), Hydrocodone    Comment: only weed currently   Sexual activity: Yes    Birth control/protection: None  Other Topics Concern   Not on file  Social History Narrative    Not on file   Social Drivers of Health   Financial Resource Strain: Not on file  Food Insecurity: No Food Insecurity (10/02/2023)   Hunger Vital Sign    Worried About Running Out of Food in the Last Year: Never true    Ran Out of Food in the Last Year: Never true  Transportation Needs: No Transportation Needs (10/02/2023)   PRAPARE - Administrator, Civil Service (Medical): No    Lack of Transportation (Non-Medical): No  Physical Activity: Not on file  Stress: Not on file  Social Connections: Not on file   Past Medical History:  Past Medical History:  Diagnosis Date   Depression    Headache    Substance abuse (HCC)     Past Surgical History:  Procedure Laterality Date   FEMUR FRACTURE SURGERY      Current Medications: Current Facility-Administered Medications  Medication Dose Route Frequency Provider Last Rate Last Admin   acetaminophen  (TYLENOL ) tablet 650 mg  650 mg Oral Q6H PRN Mardy Legacy, NP   650 mg at 10/18/23 1015   alum & mag hydroxide-simeth (MAALOX/MYLANTA) 200-200-20 MG/5ML suspension 30 mL  30 mL Oral Q4H PRN Mardy Legacy, NP       benztropine  (COGENTIN ) tablet 1 mg  1 mg Oral BID PRN Mardy Legacy, NP       Or   benztropine  mesylate (COGENTIN ) injection 1 mg  1 mg Intramuscular BID PRN Mardy Legacy, NP       benztropine  (COGENTIN ) tablet 1 mg  1 mg Oral BID PRN Cleotilde Hoy HERO, NP       fluPHENAZine  (PROLIXIN ) tablet 10 mg  10 mg Oral BID Zhane Bluitt E, PA-C   10 mg at 10/19/23 9093   hydrOXYzine  (ATARAX ) tablet 25 mg  25 mg Oral TID PRN Mardy Legacy, NP   25 mg at 10/06/23 2134   magnesium  hydroxide (MILK OF MAGNESIA) suspension 30 mL  30 mL Oral Daily PRN Mardy Legacy, NP       OLANZapine  (ZYPREXA ) injection 10 mg  10 mg Intramuscular TID PRN Mardy Legacy, NP       OLANZapine  (ZYPREXA ) injection 5 mg  5 mg Intramuscular TID PRN Mardy Legacy, NP       OLANZapine  (ZYPREXA ) tablet 10 mg  10 mg Oral BID  Cleotilde Hoy HERO, NP   10 mg at 10/19/23 9093   OLANZapine  zydis (ZYPREXA ) disintegrating tablet 5 mg  5 mg Oral TID PRN Mardy Legacy, NP       traZODone  (DESYREL ) tablet 50 mg  50 mg Oral QHS PRN Oliver Neuwirth E,  PA-C   50 mg at 10/18/23 2055    Lab Results:  No results found for this or any previous visit (from the past 48 hours).   Blood Alcohol level:  Lab Results  Component Value Date   [facility] <15 10/01/2023   ETH <10 01/18/2023    Metabolic Disorder Labs: Lab Results  Component Value Date   HGBA1C 5.6 10/05/2023   MPG 114.02 10/05/2023   MPG 123 04/05/2021   Lab Results  Component Value Date   PROLACTIN 25.5 (H) 12/25/2015   Lab Results  Component Value Date   CHOL 191 10/05/2023   TRIG 193 (H) 10/05/2023   HDL 49 10/05/2023   CHOLHDL 3.9 10/05/2023   VLDL 39 10/05/2023   LDLCALC 103 (H) 10/05/2023   LDLCALC 86 04/05/2021      Psychiatric Specialty Exam:  Presentation  General Appearance:  Disheveled  Eye Contact: Fair  Speech: Normal Rate  Speech Volume: Normal    Mood and Affect  Mood: Depressed  Affect: Flat   Thought Process  Thought Processes: Disorganized  Descriptions of Associations:Loose  Orientation:Full (Time, Place and Person)  Thought Content:Illogical  Hallucinations:No data recorded     Ideas of Reference:Other (comment) (delusions)  Suicidal Thoughts:No data recorded     Homicidal Thoughts:No data recorded      Sensorium  Memory: Recent Poor; Remote Fair  Judgment: Poor  Insight: Poor   Executive Functions  Concentration: Fair  Attention Span: Poor  Recall: Good  Fund of Knowledge: Good  Language: Fair   Psychomotor Activity  Psychomotor Activity: No data recorded    Musculoskeletal: Strength & Muscle Tone: within normal limits Gait & Station: normal Assets  Assets: Social Support    Physical Exam: Physical Exam Vitals and nursing note reviewed.   HENT:     Head: Atraumatic.  Eyes:     Extraocular Movements: Extraocular movements intact.  Pulmonary:     Effort: Pulmonary effort is normal.  Neurological:     Mental Status: He is alert.     Blood pressure 117/77, pulse 75, temperature 99.3 F (37.4 C), resp. rate 18, height 5' 6 (1.676 m), weight 63 kg, SpO2 98%. Body mass index is 22.44 kg/m.  Diagnosis: Principal Problem:   Psychosis (HCC)   PLAN: Safety and Monitoring:  -- Voluntary admission to inpatient psychiatric unit for safety, stabilization and treatment  -- Daily contact with patient to assess and evaluate symptoms and progress in treatment  -- Patient's case to be discussed in multi-disciplinary team meeting  -- Observation Level : q15 minute checks  -- Vital signs:  q12 hours  -- Precautions: suicide, elopement, and assault -- Encouraged patient to participate in unit milieu and in scheduled group therapies   2. Psychiatric Diagnoses and Treatment:   Patient had partial response with Zyprexa  second antipsychotic was added to this medication we will continue to monitor.  Psychiatric Plan and Interventions: Continue Zyprexa  to 10 mg PO BID Continue prolixin  5 mg TID  -- The risks/benefits/side-effects/alternatives to this medication were discussed in detail with the patient and time was given for questions. The patient consents to medication trial.                -- Metabolic profile and EKG monitoring obtained while on an atypical antipsychotic (BMI: Lipid Panel: HbgA1c: QTc:394)             -- Encouraged patient to participate in unit milieu and in scheduled group therapies  3. Medical Issues Being Addressed:   no acute concerns   4. Discharge Planning:   -- APS saw pt yesterday, awaiting response             -- Social work and case management to assist with discharge planning and identification of hospital follow-up needs prior to discharge             -- Estimated LOS:  5-7 days             -- Discharge Concerns: Need to establish a safety plan; Medication compliance and effectiveness             -- Discharge Goals: Return home with outpatient referrals follow ups Donnice FORBES Right, PA-C 10/19/2023, 3:10 PM

## 2023-10-19 NOTE — Group Note (Signed)
 Recreation Therapy Group Note   Group Topic:Leisure Education  Group Date: 10/19/2023 Start Time: 1300 End Time: 1355 Facilitators: Celestia Jeoffrey BRAVO, LRT, CTRS Location: Craft Room  Group Description: Leisure. Patients were given the option to choose from singing karaoke, coloring mandalas, using oil pastels, journaling, or playing with play-doh. LRT and pts discussed the meaning of leisure, the importance of participating in leisure during their free time/when they're outside of the hospital, as well as how our leisure interests can also serve as coping skills.   Goal Area(s) Addressed:  Patient will identify a current leisure interest.  Patient will learn the definition of "leisure". Patient will practice making a positive decision. Patient will have the opportunity to try a new leisure activity. Patient will communicate with peers and LRT.    Affect/Mood: N/A   Participation Level: Did not attend    Clinical Observations/Individualized Feedback: Patient did not attend group.   Plan: Continue to engage patient in RT group sessions 2-3x/week.   Jeoffrey BRAVO Celestia, LRT, CTRS 10/19/2023 2:50 PM

## 2023-10-19 NOTE — Progress Notes (Signed)
   10/18/23 2100  Psych Admission Type (Psych Patients Only)  Admission Status Involuntary  Psychosocial Assessment  Patient Complaints None  Eye Contact Fair  Facial Expression Flat  Affect Preoccupied  Speech Logical/coherent  Interaction Assertive  Motor Activity Slow  Appearance/Hygiene Body odor;Poor hygiene  Behavior Characteristics Cooperative;Appropriate to situation  Mood Pleasant  Aggressive Behavior  Effect No apparent injury  Thought Process  Coherency WDL  Content WDL  Delusions None reported or observed  Perception Hallucinations  Hallucination None reported or observed  Judgment Limited  Confusion None  Danger to Self  Current suicidal ideation? Denies  Danger to Others  Danger to Others None reported or observed

## 2023-10-19 NOTE — Progress Notes (Signed)
   10/19/23 0930  Psych Admission Type (Psych Patients Only)  Admission Status Involuntary  Psychosocial Assessment  Patient Complaints None  Eye Contact Fair  Facial Expression Flat;Sullen  Affect Sullen  Speech Logical/coherent;Soft  Interaction Isolative (isolates to room except for meals and medication; patient attends some groups)  Motor Activity Pacing;Slow (paces at times)  Appearance/Hygiene Body odor;Disheveled;Poor hygiene;In scrubs  Behavior Characteristics Cooperative;Appropriate to situation  Mood Pleasant;Sullen (patient states I'm here, when asked how he was doing today.)  Aggressive Behavior  Effect No apparent injury  Thought Process  Coherency WDL  Content WDL  Delusions None reported or observed  Perception Hallucinations  Hallucination None reported or observed (patient denies, but is observed responding to internal stimuli at times throughout the day.)  Judgment Poor  Confusion None  Danger to Self  Current suicidal ideation? Denies  Danger to Others  Danger to Others None reported or observed

## 2023-10-19 NOTE — Group Note (Signed)
 Date:  10/19/2023 Time:  2:31 PM  Group Topic/Focus:  Healthy Communication:   The focus of this group is to discuss communication, barriers to communication, as well as healthy ways to communicate with others.    Participation Level:  Did Not Attend   [name] Nashalie Sallis 10/19/2023, 2:31 PM

## 2023-10-20 DIAGNOSIS — F250 Schizoaffective disorder, bipolar type: Secondary | ICD-10-CM | POA: Diagnosis not present

## 2023-10-20 MED ORDER — FLUPHENAZINE DECANOATE 25 MG/ML IJ SOLN
25.0000 mg | INTRAMUSCULAR | Status: DC
  Administered 2023-10-21: 25 mg via INTRAMUSCULAR
  Filled 2023-10-20: qty 1

## 2023-10-20 MED ORDER — FLUPHENAZINE HCL 5 MG PO TABS
10.0000 mg | ORAL_TABLET | Freq: Every day | ORAL | Status: AC
  Administered 2023-10-20 – 2023-10-23 (×4): 10 mg via ORAL
  Filled 2023-10-20 (×5): qty 2

## 2023-10-20 MED ORDER — BENZTROPINE MESYLATE 1 MG/ML IJ SOLN
1.0000 mg | Freq: Once | INTRAMUSCULAR | Status: AC
  Administered 2023-10-21: 1 mg via INTRAMUSCULAR
  Filled 2023-10-20: qty 1

## 2023-10-20 NOTE — Plan of Care (Signed)
   Problem: Education: Goal: Knowledge of [facility] Education information/materials will improve Outcome: Progressing Goal: Emotional status will improve Outcome: Progressing Goal: Mental status will improve Outcome: Progressing

## 2023-10-20 NOTE — Group Note (Signed)
 Date:  10/20/2023 Time:  8:20 PM  Group Topic/Focus:  Activity Group: The focus of the group is to promote activity for the patients and encourage them to go outside to the courtyard and get some fresh air and exercise.    Participation Level:  Did Not Attend   [name] Charmeka Freeburg 10/20/2023, 8:20 PM

## 2023-10-20 NOTE — Plan of Care (Signed)
  Problem: Education: Goal: Knowledge of [facility] Education information/materials will improve Outcome: Progressing Goal: Emotional status will improve Outcome: Progressing Goal: Verbalization of understanding the information provided will improve Outcome: Progressing   Problem: Activity: Goal: Interest or engagement in activities will improve Outcome: Progressing   Problem: Education: Goal: Emotional status will improve Outcome: Progressing Goal: Verbalization of understanding the information provided will improve Outcome: Progressing   Problem: Coping: Goal: Level of anxiety will decrease Outcome: Progressing

## 2023-10-20 NOTE — Group Note (Signed)
 Date:  10/20/2023 Time:  2:28 PM  Group Topic/Focus:  Dimensions of Wellness:   The focus of this group is to introduce the topic of wellness and discuss the role each dimension of wellness plays in total health.    Participation Level:  Did Not Attend   [name] Makaelyn Aponte 10/20/2023, 2:28 PM

## 2023-10-20 NOTE — Progress Notes (Signed)
   10/19/23 2000  Psych Admission Type (Psych Patients Only)  Admission Status Involuntary  Psychosocial Assessment  Patient Complaints None  Eye Contact Fair  Facial Expression Flat  Affect Sullen  Speech Logical/coherent;Soft  Interaction Assertive  Motor Activity Slow;Pacing  Appearance/Hygiene Body odor  Behavior Characteristics Cooperative;Appropriate to situation  Mood Pleasant  Aggressive Behavior  Effect No apparent injury  Thought Process  Coherency WDL  Content WDL  Delusions None reported or observed  Perception Hallucinations  Hallucination None reported or observed  Judgment Poor  Danger to Self  Current suicidal ideation? Denies

## 2023-10-20 NOTE — Progress Notes (Signed)
 [facility] MD Progress Note  10/20/2023 5:11 PM Roger [name]  MRN:  [id number]  The patient is a 33 year old man with chart history of bipolar vs schizoaffective disorder; hx of polysubstance abuse, depression; he presented to ED after, reportedly, his grandmother contacted crisis services due to reported concerns re: patient responding to internal stimuli, not bathing, trespassing on church property, and digging a large hole in the backyard last week with unclear intent. Grandmother has expressed concerns with patient being discharged and, per chart, she is pursuing legal guardianship for patient via APS with hope for long-term placement. Patient was admitted to [facility] unit for psychosis . On exam today patient is disengaged and internally preoccupied.  He requires frequent redirection and is easily redirectable.  He is alert and oriented to location and self.  Indicates he does not know why he is in the hospital when questioned specifically about taking the whole he indicates he wanted to take a swim and hide from the world.  He denies SI HI and AVH however he is observed responding to internal stimuli and has discussion with self during the interview.  He is a poor historian and is unable to meaningfully fully participate in the interview given his internal preoccupations.  He denied substance use UDS was positive for THC.  He denied previous mental health admissions however they are found on chart review.  He endorses some paranoia but does not elaborate.  Per chart review there is a history of prior suicide attempts.  Given current psychosis patient requires ongoing inpatient psychiatric hospitalization for medication management and stabilization due to impaired judgment, danger to self, and danger to others.   Subjective:  Chart reviewed, case discussed in multidisciplinary meeting, patient seen during rounds.   7/5: On follow-up today patient is alert and oriented he is pleasant and cooperative with  exam.  He is agreeable to taking LAI tomorrow.  Reviewed risk versus benefit.  He request that he be discharged back to his grandmother's house pending APS okay.  He denies SI, HI, and AVH.  He still appears internally preoccupied at times.  Insight into the need for ongoing medication compliance is improving.  They deny adverse effects of medications.  They are linear on exam today.  They voiced no concerns or complaints.  They report stable mood appetite and sleep.  Continue to encourage daily hygiene, room smells better today.  7/4 on follow-up there found sitting in the room.  They indicate APS came and visited him with them yesterday.  They are appropriately oriented on exam today.  Continue to have some paranoia and lack of insight.  They are questioning when they will be discharged.  Discussed that they may be open to taking LAI they [name] further time to consider.  They continue to have poor insight into the need for outpatient follow-up and medication compliance.  They continue to struggle with daily hygiene and room is malodorous.  They are not agreeable to further medication adjustment at this time.  7/3: They are alert and oriented to self and hospital, continue to demonstrate poor insight to reason for hospitalization.  They are open to LAI specifically Prolixin  decanoate discussed risk and benefit.  They deny current hallucinations.  They continue to struggle with daily hygiene and ADLs.  They continue to be withdrawn and not engaged with staff or other patients.  They deny SI, HI, and AVH however they do appear internally preoccupied at times.  They were not observed to be  responding to internal stimuli during the interview.  7/2: Patient seen for follow-up today.  They are alert and oriented to self and hospital they continue to be unaware of why they are in the hospital.  They continue not attending group sessions to continue to isolate and be withdrawn.  They deny hallucinations during  interview.  Discussed long-acting injectable Prolixin  and they are hesitant we will revisit in the coming days.  They deny adverse effects of the medication.  They continue to demonstrate poor insight.  They continue to struggle with daily hygiene.  Per rounds yesterday APS plans to come see patient towards the end of the week.  They denied SI, HI, and AVH.  They were not observed to be responding to internal stimuli during the interview today.  7/1 patient seen for follow-up today they are alert and oriented to self and hospital.  They required as needed trazodone  overnight.  They are not attending group sessions.  Reported auditory hallucinations to nursing staff.  They are not observed to be internally preoccupied or responding to internal stimuli during interview.  Behavior continues to be bizarre.  They continue to demonstrate poor insight.  Hygiene is poor and they are malodorous.  Discussed ADLs and being able to care for self is part of discharge requirement.  Patient continues to demonstrate poor insight into the need for self-care, medication compliant, and outpatient follow-up.  Given lack of insight he continues to require psychiatric hospitalization however he appears to be near psychiatric baseline.  APS has been contacted and we are awaiting results of interview.  He notes some sedation since starting Prolixin  however.  Alert on exam.  Denies SI, HI, and AVH.  6/30: Patient seen for psychiatric follow-up.  He is noted to be walking the halls interacts pleasantly with this provider.  Daily bathing continues to be intermittent.  He is aware of who he is and that he is in a psychiatric facility he continues to not be aware of why he is here though he acknowledges the events prior to admission.  He reports stable appetite and mood.  He reports he is living with his grandmother and plans to return there.  Denies adverse effects of new medications.  Denies any psychiatric symptoms SI, HI, and AVH.   Denies paranoid delusions.  He does not appear overtly manic or psychotic on exam however he continues to lack insight appears that he is nearing baseline.  APS has been contacted and came to see the patient we have reached out to follow-up with them.  6/29: Patient seen today for follow-up psychiatric evaluation. He is noted to be in bed isolating to room today. He states awareness is in a psychiatric hospital however unsure why. He denies being able to provide any psychiatric history or social history, does not know where he was living or what he was doing prior to admission. He reports eating well and sleeping well. Remains disheveled and malodorous despite staff prompts to shower. Discuss current medication changes and rational for which he verbalizes understanding. He denies any medication side effects and there are none noted. He denies any psychiatric symptoms, denies SI/HI, denies AVH paranoia and delusions.  He is not overtly psychotic in behavior or presentation, but remains grossly delusional and lacks insight into his condition.  Call to grandmother today for collateral,  no answer, no opportunity to leave message     Sleep: Fair  Appetite:  Fair  Past Psychiatric History: see h&P Family History:  Family  History  Problem Relation Age of Onset   Alcohol abuse Father    Social History:  Social History   Substance and Sexual Activity  Alcohol Use Yes   Alcohol/week: 1.0 standard drink of alcohol   Types: 1 Shots of liquor per week     Social History   Substance and Sexual Activity  Drug Use Yes   Types: Amphetamines, Cocaine, Marijuana, Benzodiazepines, MDMA (Ecstacy), Hydrocodone   Comment: only weed currently    Social History   Socioeconomic History   Marital status: Single    Spouse name: Not on file   Number of children: Not on file   Years of education: Not on file   Highest education level: Not on file  Occupational History   Not on file  Tobacco Use    Smoking status: Former    Current packs/day: 0.50    Average packs/day: 0.5 packs/day for 10.0 years (5.0 ttl pk-yrs)    Types: Cigarettes   Smokeless tobacco: Former    Quit date: 11/24/2014  Vaping Use   Vaping status: Never Used  Substance and Sexual Activity   Alcohol use: Yes    Alcohol/week: 1.0 standard drink of alcohol    Types: 1 Shots of liquor per week   Drug use: Yes    Types: Amphetamines, Cocaine, Marijuana, Benzodiazepines, MDMA (Ecstacy), Hydrocodone    Comment: only weed currently   Sexual activity: Yes    Birth control/protection: None  Other Topics Concern   Not on file  Social History Narrative   Not on file   Social Drivers of Health   Financial Resource Strain: Not on file  Food Insecurity: No Food Insecurity (10/02/2023)   Hunger Vital Sign    Worried About Running Out of Food in the Last Year: Never true    Ran Out of Food in the Last Year: Never true  Transportation Needs: No Transportation Needs (10/02/2023)   PRAPARE - Administrator, Civil Service (Medical): No    Lack of Transportation (Non-Medical): No  Physical Activity: Not on file  Stress: Not on file  Social Connections: Not on file   Past Medical History:  Past Medical History:  Diagnosis Date   Depression    Headache    Substance abuse (HCC)     Past Surgical History:  Procedure Laterality Date   FEMUR FRACTURE SURGERY      Current Medications: Current Facility-Administered Medications  Medication Dose Route Frequency Provider Last Rate Last Admin   acetaminophen  (TYLENOL ) tablet 650 mg  650 mg Oral Q6H PRN Mardy Legacy, NP   650 mg at 10/18/23 1015   alum & mag hydroxide-simeth (MAALOX/MYLANTA) 200-200-20 MG/5ML suspension 30 mL  30 mL Oral Q4H PRN Mardy Legacy, NP       benztropine  (COGENTIN ) tablet 1 mg  1 mg Oral BID PRN Mardy Legacy, NP       Or   benztropine  mesylate (COGENTIN ) injection 1 mg  1 mg Intramuscular BID PRN Mardy Legacy, NP        benztropine  (COGENTIN ) tablet 1 mg  1 mg Oral BID PRN Cleotilde Hoy HERO, NP       NOREEN ON 10/21/2023] fluPHENAZine  decanoate (PROLIXIN ) injection 25 mg  25 mg Intramuscular Q28 days Belvie Iribe E, PA-C       And   [START ON 10/21/2023] benztropine  mesylate (COGENTIN ) injection 1 mg  1 mg Intramuscular Once Quashon Jesus E, PA-C       fluPHENAZine  (PROLIXIN ) tablet 10 mg  10 mg Oral QHS Kalai Baca E, PA-C       hydrOXYzine  (ATARAX ) tablet 25 mg  25 mg Oral TID PRN Mardy Legacy, NP   25 mg at 10/06/23 2134   magnesium  hydroxide (MILK OF MAGNESIA) suspension 30 mL  30 mL Oral Daily PRN Mardy Legacy, NP       OLANZapine  (ZYPREXA ) injection 10 mg  10 mg Intramuscular TID PRN Mardy Legacy, NP       OLANZapine  (ZYPREXA ) injection 5 mg  5 mg Intramuscular TID PRN Mardy Legacy, NP       OLANZapine  (ZYPREXA ) tablet 10 mg  10 mg Oral BID Cleotilde Hoy HERO, NP   10 mg at 10/20/23 9145   OLANZapine  zydis (ZYPREXA ) disintegrating tablet 5 mg  5 mg Oral TID PRN Mardy Legacy, NP       traZODone  (DESYREL ) tablet 50 mg  50 mg Oral QHS PRN Rasul Decola E, PA-C   50 mg at 10/19/23 2127    Lab Results:  No results found for this or any previous visit (from the past 48 hours).   Blood Alcohol level:  Lab Results  Component Value Date   [facility] <15 10/01/2023   ETH <10 01/18/2023    Metabolic Disorder Labs: Lab Results  Component Value Date   HGBA1C 5.6 10/05/2023   MPG 114.02 10/05/2023   MPG 123 04/05/2021   Lab Results  Component Value Date   PROLACTIN 25.5 (H) 12/25/2015   Lab Results  Component Value Date   CHOL 191 10/05/2023   TRIG 193 (H) 10/05/2023   HDL 49 10/05/2023   CHOLHDL 3.9 10/05/2023   VLDL 39 10/05/2023   LDLCALC 103 (H) 10/05/2023   LDLCALC 86 04/05/2021      Psychiatric Specialty Exam:  Presentation  General Appearance:  Disheveled  Eye Contact: Fair  Speech: Normal Rate  Speech Volume: Normal    Mood and  Affect  Mood: Depressed  Affect: Flat   Thought Process  Thought Processes: Disorganized  Descriptions of Associations:Loose  Orientation:Full (Time, Place and Person)  Thought Content:Illogical  Hallucinations:No data recorded     Ideas of Reference:Other (comment) (delusions)  Suicidal Thoughts:No data recorded     Homicidal Thoughts:No data recorded      Sensorium  Memory: Recent Poor; Remote Fair  Judgment: Poor  Insight: Poor   Executive Functions  Concentration: Fair  Attention Span: Poor  Recall: Good  Fund of Knowledge: Good  Language: Fair   Psychomotor Activity  Psychomotor Activity: No data recorded    Musculoskeletal: Strength & Muscle Tone: within normal limits Gait & Station: normal Assets  Assets: Social Support    Physical Exam: Physical Exam Vitals and nursing note reviewed.  HENT:     Head: Atraumatic.  Eyes:     Extraocular Movements: Extraocular movements intact.  Pulmonary:     Effort: Pulmonary effort is normal.  Neurological:     Mental Status: He is alert.     Blood pressure 111/69, pulse 72, temperature 97.7 F (36.5 C), resp. rate 15, height 5' 6 (1.676 m), weight 63 kg, SpO2 98%. Body mass index is 22.44 kg/m.  Diagnosis: Principal Problem:   Psychosis (HCC)   PLAN: Safety and Monitoring:  -- Voluntary admission to inpatient psychiatric unit for safety, stabilization and treatment  -- Daily contact with patient to assess and evaluate symptoms and progress in treatment  -- Patient's case to be discussed in multi-disciplinary team meeting  -- Observation Level : q15 minute checks  --  Vital signs:  q12 hours  -- Precautions: suicide, elopement, and assault -- Encouraged patient to participate in unit milieu and in scheduled group therapies   2. Psychiatric Diagnoses and Treatment:   Patient had partial response Zyprexa  Prolixin  was added and has been some improvement will  administer LAI to improve compliance patient is agreeable.  Psychiatric Plan and Interventions: Continue Zyprexa  to 10 mg PO BID We will administer 25 mg of Prolixin  Decanoate tomorrow with 1 mg of IM Cogentin .  For each 10 mg dose of Prolixin  is recommended to administer 12.5 mg of decanoate for total dose of 25 mg, per up-to-date.  He is also recommended that after the first injection he continue oral overlap at approximately one half the dose discontinue the a.m. dose we will continue nightly 10 mg dose in accordance. Transition from Prolixin  10 mg twice daily to Prolixin  10 mg nightly Prolixin  Decanoate 25 mg to be administered on 10/21/2023  -- The risks/benefits/side-effects/alternatives to this medication were discussed in detail with the patient and time was given for questions. The patient consents to medication trial.                -- Metabolic profile and EKG monitoring obtained while on an atypical antipsychotic (BMI: Lipid Panel: HbgA1c: QTc:394)             -- Encouraged patient to participate in unit milieu and in scheduled group therapies                            3. Medical Issues Being Addressed:   no acute concerns   4. Discharge Planning:   -- APS saw pt yesterday, awaiting response             -- Social work and case management to assist with discharge planning and identification of hospital follow-up needs prior to discharge             -- Estimated LOS: 5-7 days             -- Discharge Concerns: Need to establish a safety plan; Medication compliance and effectiveness             -- Discharge Goals: Return home with outpatient referrals follow ups Donnice FORBES Right, PA-C 10/20/2023, 5:11 PM

## 2023-10-20 NOTE — Progress Notes (Signed)
   10/20/23 0900  Psych Admission Type (Psych Patients Only)  Admission Status Involuntary  Psychosocial Assessment  Patient Complaints None  Eye Contact Fair  Facial Expression Flat  Affect Sullen  Speech Logical/coherent  Interaction Assertive  Motor Activity Slow  Appearance/Hygiene In scrubs  Behavior Characteristics Cooperative;Appropriate to situation  Mood Pleasant;Sullen  Aggressive Behavior  Effect No apparent injury  Thought Process  Coherency WDL  Content WDL  Delusions None reported or observed  Perception WDL  Hallucination None reported or observed  Judgment Impaired  Confusion None  Danger to Self  Current suicidal ideation? Denies  Danger to Others  Danger to Others None reported or observed

## 2023-10-21 DIAGNOSIS — F250 Schizoaffective disorder, bipolar type: Secondary | ICD-10-CM | POA: Diagnosis not present

## 2023-10-21 NOTE — Plan of Care (Signed)
  Problem: Education: Goal: Knowledge of [place] [facility] Education information/materials will improve Outcome: Progressing Goal: Emotional status will improve Outcome: Progressing Goal: Mental status will improve Outcome: Progressing Goal: Verbalization of understanding the information provided will improve Outcome: Progressing   Problem: Activity: Goal: Interest or engagement in activities will improve Outcome: Progressing Goal: Sleeping patterns will improve Outcome: Progressing   Problem: Coping: Goal: Ability to verbalize frustrations and anger appropriately will improve Outcome: Progressing Goal: Ability to demonstrate self-control will improve Outcome: Progressing   Problem: Health Behavior/Discharge Planning: Goal: Identification of resources available to assist in meeting health care needs will improve Outcome: Progressing Goal: Compliance with treatment plan for underlying cause of condition will improve Outcome: Progressing   Problem: Physical Regulation: Goal: Ability to maintain clinical measurements within normal limits will improve Outcome: Progressing   Problem: Safety: Goal: Periods of time without injury will increase Outcome: Progressing   Problem: Education: Goal: Emotional status will improve Outcome: Progressing Goal: Mental status will improve Outcome: Progressing Goal: Verbalization of understanding the information provided will improve Outcome: Progressing   Problem: Activity: Goal: Interest or engagement in activities will improve Outcome: Progressing Goal: Sleeping patterns will improve Outcome: Progressing   Problem: Education: Goal: Knowledge of General Education information will improve Description: Including pain rating scale, medication(s)/side effects and non-pharmacologic comfort measures Outcome: Progressing   Problem: Health Behavior/Discharge Planning: Goal: Ability to manage health-related needs will improve Outcome:  Progressing   Problem: Clinical Measurements: Goal: Ability to maintain clinical measurements within normal limits will improve Outcome: Progressing Goal: Will remain free from infection Outcome: Progressing Goal: Diagnostic test results will improve Outcome: Progressing Goal: Respiratory complications will improve Outcome: Progressing Goal: Cardiovascular complication will be avoided Outcome: Progressing   Problem: Activity: Goal: Risk for activity intolerance will decrease Outcome: Progressing   Problem: Nutrition: Goal: Adequate nutrition will be maintained Outcome: Progressing   Problem: Coping: Goal: Level of anxiety will decrease Outcome: Progressing   Problem: Elimination: Goal: Will not experience complications related to bowel motility Outcome: Progressing Goal: Will not experience complications related to urinary retention Outcome: Progressing   Problem: Pain Managment: Goal: General experience of comfort will improve and/or be controlled Outcome: Progressing   Problem: Safety: Goal: Ability to remain free from injury will improve Outcome: Progressing   Problem: Skin Integrity: Goal: Risk for impaired skin integrity will decrease Outcome: Progressing

## 2023-10-21 NOTE — Group Note (Signed)
 Date:  10/21/2023 Time:  10:18 AM  Group Topic/Focus:  Rediscovering Joy:   The focus of this group is to explore various ways to relieve stress in a positive manner.    Participation Level:  Active  Participation Quality:  Appropriate  Affect:  Appropriate  Cognitive:  Appropriate  Insight: Appropriate  Engagement in Group:  Engaged  Modes of Intervention:  Activity  Additional Comments:    [name] Amarrion Pastorino 10/21/2023, 10:18 AM

## 2023-10-21 NOTE — Progress Notes (Signed)
   10/21/23 1500  Psych Admission Type (Psych Patients Only)  Admission Status Involuntary  Psychosocial Assessment  Patient Complaints None  Eye Contact Fair  Facial Expression Flat  Affect Flat  Speech Logical/coherent;Soft  Interaction Forwards little;Isolative (patient isolates to room, except for meals and medication)  Motor Activity Slow;Pacing (paces at times)  Appearance/Hygiene In scrubs;Body odor;Disheveled (body odor not as strong as when he was first admitted.)  Behavior Characteristics Cooperative;Appropriate to situation  Mood Pleasant  Aggressive Behavior  Effect No apparent injury  Thought Process  Coherency WDL  Content WDL  Delusions None reported or observed  Perception WDL  Hallucination None reported or observed  Judgment WDL  Confusion None  Danger to Self  Current suicidal ideation? Denies  Danger to Others  Danger to Others None reported or observed   Patient's goal for today, per his self-inventory is to get out of here.

## 2023-10-21 NOTE — Group Note (Signed)
 LCSW Group Therapy Note   Group Date: 10/21/2023 Start Time: 1300 End Time: 1400   Type of Therapy and Topic:  Group Therapy: Boundaries  Participation Level:  Did Not Attend  Description of Group: This group will address the use of boundaries in their personal lives. Patients will explore why boundaries are important, the difference between healthy and unhealthy boundaries, and negative and postive outcomes of different boundaries and will look at how boundaries can be crossed.  Patients will be encouraged to identify current boundaries in their own lives and identify what kind of boundary is being set. Facilitators will guide patients in utilizing problem-solving interventions to address and correct types boundaries being used and to address when no boundary is being used. Understanding and applying boundaries will be explored and addressed for obtaining and maintaining a balanced life. Patients will be encouraged to explore ways to assertively make their boundaries and needs known to significant others in their lives, using other group members and facilitator for role play, support, and feedback.  Therapeutic Goals:  1.  Patient will identify areas in their life where setting clear boundaries could be  used to improve their life.  2.  Patient will identify signs/triggers that a boundary is not being respected. 3.  Patient will identify two ways to set boundaries in order to achieve balance in  their lives: 4.  Patient will demonstrate ability to communicate their needs and set boundaries  through discussion and/or role plays  Summary of Patient Progress:  The patient did not attend.   Therapeutic Modalities:   Cognitive Behavioral Therapy Solution-Focused Therapy  Roselyn GORMAN Lento, LCSWA 10/21/2023  4:19 PM

## 2023-10-21 NOTE — Progress Notes (Signed)
 [facility] MD Progress Note  10/21/2023 2:05 PM Roger [name]  MRN:  [id number]  The patient is a 33 year old man with chart history of bipolar vs schizoaffective disorder; hx of polysubstance abuse, depression; he presented to ED after, reportedly, his grandmother contacted crisis services due to reported concerns re: patient responding to internal stimuli, not bathing, trespassing on church property, and digging a large hole in the backyard last week with unclear intent. Grandmother has expressed concerns with patient being discharged and, per chart, she is pursuing legal guardianship for patient via APS with hope for long-term placement. Patient was admitted to [facility] unit for psychosis . On exam today patient is disengaged and internally preoccupied.  He requires frequent redirection and is easily redirectable.  He is alert and oriented to location and self.  Indicates he does not know why he is in the hospital when questioned specifically about taking the whole he indicates he wanted to take a swim and hide from the world.  He denies SI HI and AVH however he is observed responding to internal stimuli and has discussion with self during the interview.  He is a poor historian and is unable to meaningfully fully participate in the interview given his internal preoccupations.  He denied substance use UDS was positive for THC.  He denied previous mental health admissions however they are found on chart review.  He endorses some paranoia but does not elaborate.  Per chart review there is a history of prior suicide attempts.  Given current psychosis patient requires ongoing inpatient psychiatric hospitalization for medication management and stabilization due to impaired judgment, danger to self, and danger to others.   Subjective:  Chart reviewed, case discussed in multidisciplinary meeting, patient seen during rounds.   7/6 on follow-up today patient recently received LAI is tolerating well without adverse  effect.  He questions about his discharge.  Again discussed we are waiting for response from APS.  He denies SI, HI, and AVH.  He is more linear on exam still continues to lack insight.  He is agreeable to continue medication at discharge.  They voiced no concerns or complaints.  They note stable mood appetite and sleep.  They appear to have bathed and brush their teeth today.  The room is not malodorous.  7/5: On follow-up today patient is alert and oriented he is pleasant and cooperative with exam.  He is agreeable to taking LAI tomorrow.  Reviewed risk versus benefit.  He request that he be discharged back to his grandmother's house pending APS okay.  He denies SI, HI, and AVH.  He still appears internally preoccupied at times.  Insight into the need for ongoing medication compliance is improving.  They deny adverse effects of medications.  They are linear on exam today.  They voiced no concerns or complaints.  They report stable mood appetite and sleep.  Continue to encourage daily hygiene, room smells better today.  7/4 on follow-up there found sitting in the room.  They indicate APS came and visited him with them yesterday.  They are appropriately oriented on exam today.  Continue to have some paranoia and lack of insight.  They are questioning when they will be discharged.  Discussed that they may be open to taking LAI they want further time to consider.  They continue to have poor insight into the need for outpatient follow-up and medication compliance.  They continue to struggle with daily hygiene and room is malodorous.  They are not agreeable  to further medication adjustment at this time.  7/3: They are alert and oriented to self and hospital, continue to demonstrate poor insight to reason for hospitalization.  They are open to LAI specifically Prolixin  decanoate discussed risk and benefit.  They deny current hallucinations.  They continue to struggle with daily hygiene and ADLs.  They continue to  be withdrawn and not engaged with staff or other patients.  They deny SI, HI, and AVH however they do appear internally preoccupied at times.  They were not observed to be responding to internal stimuli during the interview.  7/2: Patient seen for follow-up today.  They are alert and oriented to self and hospital they continue to be unaware of why they are in the hospital.  They continue not attending group sessions to continue to isolate and be withdrawn.  They deny hallucinations during interview.  Discussed long-acting injectable Prolixin  and they are hesitant we will revisit in the coming days.  They deny adverse effects of the medication.  They continue to demonstrate poor insight.  They continue to struggle with daily hygiene.  Per rounds yesterday APS plans to come see patient towards the end of the week.  They denied SI, HI, and AVH.  They were not observed to be responding to internal stimuli during the interview today.  7/1 patient seen for follow-up today they are alert and oriented to self and hospital.  They required as needed trazodone  overnight.  They are not attending group sessions.  Reported auditory hallucinations to nursing staff.  They are not observed to be internally preoccupied or responding to internal stimuli during interview.  Behavior continues to be bizarre.  They continue to demonstrate poor insight.  Hygiene is poor and they are malodorous.  Discussed ADLs and being able to care for self is part of discharge requirement.  Patient continues to demonstrate poor insight into the need for self-care, medication compliant, and outpatient follow-up.  Given lack of insight he continues to require psychiatric hospitalization however he appears to be near psychiatric baseline.  APS has been contacted and we are awaiting results of interview.  He notes some sedation since starting Prolixin  however.  Alert on exam.  Denies SI, HI, and AVH.  6/30: Patient seen for psychiatric follow-up.  He  is noted to be walking the halls interacts pleasantly with this provider.  Daily bathing continues to be intermittent.  He is aware of who he is and that he is in a psychiatric facility he continues to not be aware of why he is here though he acknowledges the events prior to admission.  He reports stable appetite and mood.  He reports he is living with his grandmother and plans to return there.  Denies adverse effects of new medications.  Denies any psychiatric symptoms SI, HI, and AVH.  Denies paranoid delusions.  He does not appear overtly manic or psychotic on exam however he continues to lack insight appears that he is nearing baseline.  [facility] has been contacted and came to see the patient we have reached out to follow-up with them.  6/29: Patient seen today for follow-up psychiatric evaluation. He is noted to be in bed isolating to room today. He states awareness is in a psychiatric hospital however unsure why. He denies being able to provide any psychiatric history or social history, does not know where he was living or what he was doing prior to admission. He reports eating well and sleeping well. Remains disheveled and malodorous despite staff prompts  to shower. Discuss current medication changes and rational for which he verbalizes understanding. He denies any medication side effects and there are none noted. He denies any psychiatric symptoms, denies SI/HI, denies AVH paranoia and delusions.  He is not overtly psychotic in behavior or presentation, but remains grossly delusional and lacks insight into his condition.  Call to grandmother today for collateral,  no answer, no opportunity to leave message     Sleep: Fair  Appetite:  Fair  Past Psychiatric History: see h&P Family History:  Family History  Problem Relation Age of Onset   Alcohol abuse Father    Social History:  Social History   Substance and Sexual Activity  Alcohol Use Yes   Alcohol/week: 1.0 standard drink of alcohol    Types: 1 Shots of liquor per week     Social History   Substance and Sexual Activity  Drug Use Yes   Types: Amphetamines, Cocaine, Marijuana, Benzodiazepines, MDMA (Ecstacy), Hydrocodone   Comment: only weed currently    Social History   Socioeconomic History   Marital status: Single    Spouse name: Not on file   Number of children: Not on file   Years of education: Not on file   Highest education level: Not on file  Occupational History   Not on file  Tobacco Use   Smoking status: Former    Current packs/day: 0.50    Average packs/day: 0.5 packs/day for 10.0 years (5.0 ttl pk-yrs)    Types: Cigarettes   Smokeless tobacco: Former    Quit date: 11/24/2014  Vaping Use   Vaping status: Never Used  Substance and Sexual Activity   Alcohol use: Yes    Alcohol/week: 1.0 standard drink of alcohol    Types: 1 Shots of liquor per week   Drug use: Yes    Types: Amphetamines, Cocaine, Marijuana, Benzodiazepines, MDMA (Ecstacy), Hydrocodone    Comment: only weed currently   Sexual activity: Yes    Birth control/protection: None  Other Topics Concern   Not on file  Social History Narrative   Not on file   Social Drivers of Health   Financial Resource Strain: Not on file  Food Insecurity: No Food Insecurity (10/02/2023)   Hunger Vital Sign    Worried About Running Out of Food in the Last Year: Never true    Ran Out of Food in the Last Year: Never true  Transportation Needs: No Transportation Needs (10/02/2023)   PRAPARE - Administrator, Civil Service (Medical): No    Lack of Transportation (Non-Medical): No  Physical Activity: Not on file  Stress: Not on file  Social Connections: Not on file   Past Medical History:  Past Medical History:  Diagnosis Date   Depression    Headache    Substance abuse (HCC)     Past Surgical History:  Procedure Laterality Date   FEMUR FRACTURE SURGERY      Current Medications: Current Facility-Administered Medications   Medication Dose Route Frequency Provider Last Rate Last Admin   acetaminophen  (TYLENOL ) tablet 650 mg  650 mg Oral Q6H PRN Mardy Legacy, NP   650 mg at 10/21/23 1123   alum & mag hydroxide-simeth (MAALOX/MYLANTA) 200-200-20 MG/5ML suspension 30 mL  30 mL Oral Q4H PRN Mardy Legacy, NP       benztropine  (COGENTIN ) tablet 1 mg  1 mg Oral BID PRN Mardy Legacy, NP       Or   benztropine  mesylate (COGENTIN ) injection 1 mg  1  mg Intramuscular BID PRN Mardy Legacy, NP       benztropine  (COGENTIN ) tablet 1 mg  1 mg Oral BID PRN Cleotilde Hoy HERO, NP       fluPHENAZine  (PROLIXIN ) tablet 10 mg  10 mg Oral QHS Jannie Doyle E, PA-C   10 mg at 10/20/23 2101   fluPHENAZine  decanoate (PROLIXIN ) injection 25 mg  25 mg Intramuscular Q28 days Miguelina Fore E, PA-C   25 mg at 10/21/23 9092   hydrOXYzine  (ATARAX ) tablet 25 mg  25 mg Oral TID PRN Mardy Legacy, NP   25 mg at 10/06/23 2134   magnesium  hydroxide (MILK OF MAGNESIA) suspension 30 mL  30 mL Oral Daily PRN Mardy Legacy, NP       OLANZapine  (ZYPREXA ) injection 10 mg  10 mg Intramuscular TID PRN Mardy Legacy, NP       OLANZapine  (ZYPREXA ) injection 5 mg  5 mg Intramuscular TID PRN Mardy Legacy, NP       OLANZapine  (ZYPREXA ) tablet 10 mg  10 mg Oral BID Cleotilde Hoy HERO, NP   10 mg at 10/21/23 9091   OLANZapine  zydis (ZYPREXA ) disintegrating tablet 5 mg  5 mg Oral TID PRN Mardy Legacy, NP       traZODone  (DESYREL ) tablet 50 mg  50 mg Oral QHS PRN Adonna Horsley E, PA-C   50 mg at 10/20/23 2100    Lab Results:  No results found for this or any previous visit (from the past 48 hours).   Blood Alcohol level:  Lab Results  Component Value Date   [facility] <15 10/01/2023   ETH <10 01/18/2023    Metabolic Disorder Labs: Lab Results  Component Value Date   HGBA1C 5.6 10/05/2023   MPG 114.02 10/05/2023   MPG 123 04/05/2021   Lab Results  Component Value Date   PROLACTIN 25.5 (H) 12/25/2015   Lab  Results  Component Value Date   CHOL 191 10/05/2023   TRIG 193 (H) 10/05/2023   HDL 49 10/05/2023   CHOLHDL 3.9 10/05/2023   VLDL 39 10/05/2023   LDLCALC 103 (H) 10/05/2023   LDLCALC 86 04/05/2021      Psychiatric Specialty Exam:  Presentation  General Appearance:  Disheveled  Eye Contact: Fair  Speech: Normal Rate  Speech Volume: Normal    Mood and Affect  Mood: Depressed  Affect: Flat   Thought Process  Thought Processes: Disorganized  Descriptions of Associations:Loose  Orientation:Full (Time, Place and Person)  Thought Content:Illogical  Hallucinations:No data recorded     Ideas of Reference:Other (comment) (delusions)  Suicidal Thoughts:No data recorded     Homicidal Thoughts:No data recorded      Sensorium  Memory: Recent Poor; Remote Fair  Judgment: Poor  Insight: Poor   Executive Functions  Concentration: Fair  Attention Span: Poor  Recall: Good  Fund of Knowledge: Good  Language: Fair   Psychomotor Activity  Psychomotor Activity: No data recorded    Musculoskeletal: Strength & Muscle Tone: within normal limits Gait & Station: normal Assets  Assets: Social Support    Physical Exam: Physical Exam Vitals and nursing note reviewed.  HENT:     Head: Atraumatic.  Eyes:     Extraocular Movements: Extraocular movements intact.  Pulmonary:     Effort: Pulmonary effort is normal.  Neurological:     Mental Status: He is alert.     Blood pressure 118/87, pulse 73, temperature 97.8 F (36.6 C), resp. rate 16, height 5' 6 (1.676 m), weight 63 kg, SpO2 99%.  Body mass index is 22.44 kg/m.  Diagnosis: Principal Problem:   Psychosis (HCC)   PLAN: Safety and Monitoring:  -- Voluntary admission to inpatient psychiatric unit for safety, stabilization and treatment  -- Daily contact with patient to assess and evaluate symptoms and progress in treatment  -- Patient's case to be discussed in  multi-disciplinary team meeting  -- Observation Level : q15 minute checks  -- Vital signs:  q12 hours  -- Precautions: suicide, elopement, and assault -- Encouraged patient to participate in unit milieu and in scheduled group therapies   2. Psychiatric Diagnoses and Treatment:   Patient received LAI today we are awaiting response from APS.  Patient will likely be appropriate for discharge later in the week pending APS response.  Psychiatric Plan and Interventions: Continue Zyprexa  to 10 mg PO BID Prolixin  Decanoate 25 mg administered on 10/21/2023 Can be readministered in 14-28 days depending on tolerability. Continue Prolixing 10 mg at bedtime x14 days  -- The risks/benefits/side-effects/alternatives to this medication were discussed in detail with the patient and time was given for questions. The patient consents to medication trial.                -- Metabolic profile and EKG monitoring obtained while on an atypical antipsychotic (BMI: Lipid Panel: HbgA1c: QTc:394)             -- Encouraged patient to participate in unit milieu and in scheduled group therapies                            3. Medical Issues Being Addressed:   no acute concerns   4. Discharge Planning:   -- A awaiting APS response             -- Social work and case management to assist with discharge planning and identification of hospital follow-up needs prior to discharge             -- Estimated LOS: 5-7 days             -- Discharge Concerns: Need to establish a safety plan; Medication compliance and effectiveness             -- Discharge Goals: Return home with outpatient referrals follow ups Donnice FORBES Right, PA-C 10/21/2023, 2:05 PM

## 2023-10-21 NOTE — Progress Notes (Signed)
 Patient continues to randomly pace the hall; visible in milieu but isolated.  Reports, "I am Ok.  Have you heard anything about when I am going home? I have been here for about three weeks; I am ready to go out."  Pt expressed willingness to take LAI.  He denied SI/HI, AVH.  He remains medication complaint.    10/20/23 2200  Psych Admission Type (Psych Patients Only)  Admission Status Involuntary  Psychosocial Assessment  Patient Complaints None  Eye Contact Fair  Facial Expression Flat  Affect Flat  Speech Logical/coherent  Interaction Assertive  Motor Activity Slow  Appearance/Hygiene In scrubs  Behavior Characteristics Cooperative  Mood Pleasant  Thought Process  Coherency WDL  Content WDL  Delusions None reported or observed  Perception WDL  Hallucination None reported or observed  Judgment Impaired  Confusion None  Danger to Self  Current suicidal ideation? Denies  Danger to Others  Danger to Others None reported or observed    Problem: Education: Goal: Knowledge of [place] [facility] Education information/materials will improve Outcome: Progressing Goal: Emotional status will improve Outcome: Progressing Goal: Mental status will improve Outcome: Progressing Goal: Verbalization of understanding the information provided will improve Outcome: Progressing   Problem: Activity: Goal: Interest or engagement in activities will improve Outcome: Progressing Goal: Sleeping patterns will improve Outcome: Progressing   Problem: Coping: Goal: Ability to verbalize frustrations and anger appropriately will improve Outcome: Progressing Goal: Ability to demonstrate self-control will improve Outcome: Progressing

## 2023-10-22 DIAGNOSIS — F209 Schizophrenia, unspecified: Secondary | ICD-10-CM

## 2023-10-22 NOTE — Group Note (Signed)
 Date:  10/22/2023 Time:  9:17 PM  Group Topic/Focus:  Wrap-Up Group:   The focus of this group is to help patients review their daily goal of treatment and discuss progress on daily workbooks.    Participation Level:  Did Not Attend   Larrie Leita BRAVO 10/22/2023, 9:17 PM

## 2023-10-22 NOTE — Group Note (Signed)
 Recreation Therapy Group Note   Group Topic:Health and Wellness  Group Date: 10/22/2023 Start Time: 1050 End Time: 1130 Facilitators: Celestia Jeoffrey BRAVO, LRT, CTRS Location: Courtyard  Group Description: [employer]. LRT and patients played games of basketball, drew with chalk, and played corn hole while outside in the courtyard while getting fresh air and sunlight. Music was being played in the background. LRT and peers conversed about different games they have played before, what they do in their free time and anything else that is on their minds. LRT encouraged pts to drink water after being outside, sweating and getting their heart rate up.  Goal Area(s) Addressed: Patient will build on frustration tolerance skills. Patients will partake in a competitive play game with peers. Patients will gain knowledge of new leisure interest/hobby.    Affect/Mood: N/A   Participation Level: Did not attend    Clinical Observations/Individualized Feedback: Patient did not attend group.   Plan: Continue to engage patient in RT group sessions 2-3x/week.   Jeoffrey BRAVO Celestia, LRT, CTRS 10/22/2023 12:53 PM

## 2023-10-22 NOTE — Progress Notes (Signed)
 Patient saw this Clinical research associate taking another patient their shoes, when he asked this writer if he could have his shoes. While in the search room retrieving his shoes, patient asked if there was a pack of cigarettes in his belongings. This writer told him yes, but I can't give you those. Patient chuckled and said they used to let you smoke in here a long time ago. This Clinical research associate stated that yes, this facility is now a smoke free, tobacco free facility. Patient took his shoes and walked off, down towards the dayroom.

## 2023-10-22 NOTE — Progress Notes (Signed)
   10/22/23 1200  Psych Admission Type (Psych Patients Only)  Admission Status Involuntary  Psychosocial Assessment  Patient Complaints None  Eye Contact Fair  Facial Expression Flat  Affect Flat  Speech Logical/coherent;Soft  Interaction Forwards little;Isolative (patient isolates to room, except for meals and medication.)  Motor Activity Slow;Pacing (paces the unit at times)  Appearance/Hygiene In scrubs;Body odor;Disheveled  Behavior Characteristics Cooperative  Mood Pleasant  Aggressive Behavior  Effect No apparent injury  Thought Process  Coherency WDL  Content WDL  Delusions None reported or observed  Perception WDL  Hallucination None reported or observed  Judgment WDL  Confusion None  Danger to Self  Current suicidal ideation? Denies  Danger to Others  Danger to Others None reported or observed   Patient had no complaints or concerns, only showing eagerness to go home.

## 2023-10-22 NOTE — Group Note (Signed)
 Date:  10/22/2023 Time:  5:48 PM  Group Topic/Focus:  Goals Group:   The focus of this group is to help patients establish daily goals to achieve during treatment and discuss how the patient can incorporate goal setting into their daily lives to aide in recovery. Healthy Communication:   The focus of this group is to discuss communication, barriers to communication, as well as healthy ways to communicate with others.    Participation Level:  Active  Participation Quality:  Appropriate  Affect:  Appropriate  Cognitive:  Alert  Insight: Appropriate  Engagement in Group:  Engaged  Modes of Intervention:  Activity and Socialization  Additional Comments:    Deitra Caron Mainland 10/22/2023, 5:48 PM

## 2023-10-22 NOTE — Plan of Care (Signed)
  Problem: Education: Goal: Knowledge of [place] [facility] Education information/materials will improve Outcome: Progressing Goal: Emotional status will improve Outcome: Progressing Goal: Mental status will improve Outcome: Progressing Goal: Verbalization of understanding the information provided will improve Outcome: Progressing   Problem: Activity: Goal: Interest or engagement in activities will improve Outcome: Progressing Goal: Sleeping patterns will improve Outcome: Progressing   Problem: Coping: Goal: Ability to verbalize frustrations and anger appropriately will improve Outcome: Progressing Goal: Ability to demonstrate self-control will improve Outcome: Progressing   Problem: Health Behavior/Discharge Planning: Goal: Identification of resources available to assist in meeting health care needs will improve Outcome: Progressing Goal: Compliance with treatment plan for underlying cause of condition will improve Outcome: Progressing   Problem: Physical Regulation: Goal: Ability to maintain clinical measurements within normal limits will improve Outcome: Progressing   Problem: Safety: Goal: Periods of time without injury will increase Outcome: Progressing   Problem: Education: Goal: Emotional status will improve Outcome: Progressing Goal: Mental status will improve Outcome: Progressing Goal: Verbalization of understanding the information provided will improve Outcome: Progressing   Problem: Activity: Goal: Interest or engagement in activities will improve Outcome: Progressing Goal: Sleeping patterns will improve Outcome: Progressing   Problem: Education: Goal: Knowledge of General Education information will improve Description: Including pain rating scale, medication(s)/side effects and non-pharmacologic comfort measures Outcome: Progressing   Problem: Health Behavior/Discharge Planning: Goal: Ability to manage health-related needs will improve Outcome:  Progressing   Problem: Clinical Measurements: Goal: Ability to maintain clinical measurements within normal limits will improve Outcome: Progressing Goal: Will remain free from infection Outcome: Progressing Goal: Diagnostic test results will improve Outcome: Progressing Goal: Respiratory complications will improve Outcome: Progressing Goal: Cardiovascular complication will be avoided Outcome: Progressing   Problem: Activity: Goal: Risk for activity intolerance will decrease Outcome: Progressing   Problem: Nutrition: Goal: Adequate nutrition will be maintained Outcome: Progressing   Problem: Coping: Goal: Level of anxiety will decrease Outcome: Progressing   Problem: Elimination: Goal: Will not experience complications related to bowel motility Outcome: Progressing Goal: Will not experience complications related to urinary retention Outcome: Progressing   Problem: Pain Managment: Goal: General experience of comfort will improve and/or be controlled Outcome: Progressing   Problem: Safety: Goal: Ability to remain free from injury will improve Outcome: Progressing   Problem: Skin Integrity: Goal: Risk for impaired skin integrity will decrease Outcome: Progressing

## 2023-10-22 NOTE — Group Note (Signed)
 [facility] LCSW Group Therapy Note    Group Date: 10/22/2023 Start Time: 1300 End Time: 1345  Type of Therapy and Topic:  Group Therapy:  Overcoming Obstacles  Participation Level:  [facility] PARTICIPATION LEVEL: Did Not Attend   Description of Group:   In this group patients will be encouraged to explore what they see as obstacles to their own wellness and recovery. They will be guided to discuss their thoughts, feelings, and behaviors related to these obstacles. The group will process together ways to cope with barriers, with attention given to specific choices patients can make. Each patient will be challenged to identify changes they are motivated to make in order to overcome their obstacles. This group will be process-oriented, with patients participating in exploration of their own experiences as well as giving and receiving support and challenge from other group members.  Therapeutic Goals: 1. Patient will identify personal and current obstacles as they relate to admission. 2. Patient will identify barriers that currently interfere with their wellness or overcoming obstacles.  3. Patient will identify feelings, thought process and behaviors related to these barriers. 4. Patient will identify two changes they are willing to make to overcome these obstacles:    Summary of Patient Progress Patient did not attend the group.    Therapeutic Modalities:   Cognitive Behavioral Therapy Solution Focused Therapy Motivational Interviewing Relapse Prevention Therapy   Nadara JONELLE Fam, LCSW

## 2023-10-22 NOTE — Progress Notes (Signed)
 Roger [name] is a 33 y.o. male patient. No diagnosis found. Past Medical History:  Diagnosis Date   Depression    Headache    Substance abuse (HCC)    Current Facility-Administered Medications  Medication Dose Route Frequency Provider Last Rate Last Admin   acetaminophen  (TYLENOL ) tablet 650 mg  650 mg Oral Q6H PRN Mardy Legacy, NP   650 mg at 10/21/23 1123   alum & mag hydroxide-simeth (MAALOX/MYLANTA) 200-200-20 MG/5ML suspension 30 mL  30 mL Oral Q4H PRN Mardy Legacy, NP       benztropine  (COGENTIN ) tablet 1 mg  1 mg Oral BID PRN Mardy Legacy, NP       Or   benztropine  mesylate (COGENTIN ) injection 1 mg  1 mg Intramuscular BID PRN Mardy Legacy, NP       benztropine  (COGENTIN ) tablet 1 mg  1 mg Oral BID PRN Cleotilde Hoy HERO, NP       fluPHENAZine  (PROLIXIN ) tablet 10 mg  10 mg Oral QHS Millington, Matthew E, PA-C   10 mg at 10/22/23 2117   fluPHENAZine  decanoate (PROLIXIN ) injection 25 mg  25 mg Intramuscular Q28 days Millington, Matthew E, PA-C   25 mg at 10/21/23 9092   hydrOXYzine  (ATARAX ) tablet 25 mg  25 mg Oral TID PRN Mardy Legacy, NP   25 mg at 10/06/23 2134   magnesium  hydroxide (MILK OF MAGNESIA) suspension 30 mL  30 mL Oral Daily PRN Mardy Legacy, NP       OLANZapine  (ZYPREXA ) injection 10 mg  10 mg Intramuscular TID PRN Mardy Legacy, NP       OLANZapine  (ZYPREXA ) injection 5 mg  5 mg Intramuscular TID PRN Mardy Legacy, NP       OLANZapine  (ZYPREXA ) tablet 10 mg  10 mg Oral BID Cleotilde Hoy HERO, NP   10 mg at 10/22/23 2117   OLANZapine  zydis (ZYPREXA ) disintegrating tablet 5 mg  5 mg Oral TID PRN Mardy Legacy, NP       traZODone  (DESYREL ) tablet 50 mg  50 mg Oral QHS PRN Millington, Matthew E, PA-C   50 mg at 10/22/23 2117   Allergies  Allergen Reactions   Haldol  [Haloperidol  Lactate]     EPS sxs   Principal Problem:   Schizophrenia (HCC)  Blood pressure 107/88, pulse 88, temperature (!) 97.3 F (36.3 C), resp. rate  16, height 5' 6 (1.676 m), weight 63 kg, SpO2 99%.  Subjective Objective: Vital signs: (most recent): Blood pressure 107/88, pulse 88, temperature (!) 97.3 F (36.3 C), resp. rate 16, height 5' 6 (1.676 m), weight 63 kg, SpO2 99%.    Assessment & Plan  Patient denies SI/ HI/Anxiety and pain this shift upon assessment. He contracted verbally for safety. Discuss Discharge plans, patient verbalized the plan is to be on a long acting med at Discharge. Discussed with patient to investigate the possibility of getting the monthly shot [facility] if he runs into visit scheduling issues with his PCP. He verbalized understanding with read back and said he will look into it. He verbalized his readiness to be discharged.    Brie Eppard B Ezariah Nace 10/22/2023

## 2023-10-22 NOTE — Progress Notes (Signed)
 [facility] MD Progress Note  10/22/2023 1:33 PM Roger [name]  MRN:  [id number]  The patient is a 33 year old man with chart history of bipolar vs schizoaffective disorder; hx of polysubstance abuse, depression; he presented to ED after, reportedly, his grandmother contacted crisis services due to reported concerns re: patient responding to internal stimuli, not bathing, trespassing on church property, and digging a large hole in the backyard last week with unclear intent. Grandmother has expressed concerns with patient being discharged and, per chart, she is pursuing legal guardianship for patient via APS with hope for long-term placement. Patient was admitted to [facility] unit for psychosis . On exam today patient is disengaged and internally preoccupied.  He requires frequent redirection and is easily redirectable.  He is alert and oriented to location and self.  Indicates he does not know why he is in the hospital when questioned specifically about taking the whole he indicates he wanted to take a swim and hide from the world.  He denies SI HI and AVH however he is observed responding to internal stimuli and has discussion with self during the interview.  He is a poor historian and is unable to meaningfully fully participate in the interview given his internal preoccupations.  He denied substance use UDS was positive for THC.  He denied previous mental health admissions however they are found on chart review.  He endorses some paranoia but does not elaborate.  Per chart review there is a history of prior suicide attempts.  Given current psychosis patient requires ongoing inpatient psychiatric hospitalization for medication management and stabilization due to impaired judgment, danger to self, and danger to others.   Subjective:  Chart reviewed, case discussed in multidisciplinary meeting, patient seen during rounds.   Patient seen today for follow-up psychiatric evaluation. Upon approach, he is laying in bed  but agreeable for interview. He states, "Well, I'm still here," when asked about his current experience. In review of events leading to this admission he sates well they say I wasn't taking a bath adding this has improved and he does not understand why he would not of bathed. He denies any past psychiatric history or knowledge of why he has been admitted to this setting beyond. He continues to demonstrate poor insight into the nature and events surrounding this hospitalization. He is oriented to person, place, and time but remains disoriented to the circumstances leading to this admission, denying any history of psychiatric illness or past inpatient treatment. He is able to report that he cannot keep a job but is unable to articulate why. He states, "Well, I've been smoking weed and drinking a lot for 17 years." He denies hallucinations, paranoia, delusions, suicidal ideation (SI), or homicidal ideation (HI). He denies medication side effects and there are none noted. He is able to provide never been married and no children.   Patient is laying in bed at the time of encounter. He is alert and cooperative for interview. Grooming is fair. Speech is spontaneous and goal-directed. Mood is described as neutral with constricted affect. Thought process is linear but insight remains poor, as patient denies any awareness of past psychiatric history or need for care. Thought content does not reflect overt psychosis. Eye contact is appropriate. Psychomotor activity is normal. Attention is adequate. He is oriented to person, place, and time. Reports sleep and appetite as stable.  Collateral information was obtained from patient's grandmother, Roger [name] [phone].  Despite patient's denial of any psychiatric history, collateral obtained from his  grandmother, Roger [name], confirms a long-standing diagnosis of schizophrenia beginning approximately 10 years ago, with multiple prior inpatient psychiatric admissions, most recently at  [facility] over one year ago. She describes a marked clinical decline since his last hospitalization, with loss of memory, inability to engage in reality-based conversation, and worsening confusion -- symptoms that are new and not representative of his prior baseline. She reports that he has refused medications for over a year, resulting in decompensation. Grandmother states she can no longer care for him and confirms he has no income, no benefits, and no social support.     Sleep: Fair  Appetite:  Fair  Past Psychiatric History: see h&P Family History:  Family History  Problem Relation Age of Onset   Alcohol abuse Father    Social History:  Social History   Substance and Sexual Activity  Alcohol Use Yes   Alcohol/week: 1.0 standard drink of alcohol   Types: 1 Shots of liquor per week     Social History   Substance and Sexual Activity  Drug Use Yes   Types: Amphetamines, Cocaine, Marijuana, Benzodiazepines, MDMA (Ecstacy), Hydrocodone   Comment: only weed currently    Social History   Socioeconomic History   Marital status: Single    Spouse name: Not on file   Number of children: Not on file   Years of education: Not on file   Highest education level: Not on file  Occupational History   Not on file  Tobacco Use   Smoking status: Former    Current packs/day: 0.50    Average packs/day: 0.5 packs/day for 10.0 years (5.0 ttl pk-yrs)    Types: Cigarettes   Smokeless tobacco: Former    Quit date: 11/24/2014  Vaping Use   Vaping status: Never Used  Substance and Sexual Activity   Alcohol use: Yes    Alcohol/week: 1.0 standard drink of alcohol    Types: 1 Shots of liquor per week   Drug use: Yes    Types: Amphetamines, Cocaine, Marijuana, Benzodiazepines, MDMA (Ecstacy), Hydrocodone    Comment: only weed currently   Sexual activity: Yes    Birth control/protection: None  Other Topics Concern   Not on file  Social History Narrative   Not on file   Social Drivers  of Health   Financial Resource Strain: Not on file  Food Insecurity: No Food Insecurity (10/02/2023)   Hunger Vital Sign    Worried About Running Out of Food in the Last Year: Never true    Ran Out of Food in the Last Year: Never true  Transportation Needs: No Transportation Needs (10/02/2023)   PRAPARE - Administrator, Civil Service (Medical): No    Lack of Transportation (Non-Medical): No  Physical Activity: Not on file  Stress: Not on file  Social Connections: Not on file   Past Medical History:  Past Medical History:  Diagnosis Date   Depression    Headache    Substance abuse (HCC)     Past Surgical History:  Procedure Laterality Date   FEMUR FRACTURE SURGERY      Current Medications: Current Facility-Administered Medications  Medication Dose Route Frequency Provider Last Rate Last Admin   acetaminophen  (TYLENOL ) tablet 650 mg  650 mg Oral Q6H PRN Mardy Legacy, NP   650 mg at 10/21/23 1123   alum & mag hydroxide-simeth (MAALOX/MYLANTA) 200-200-20 MG/5ML suspension 30 mL  30 mL Oral Q4H PRN Mardy Legacy, NP       benztropine  (COGENTIN ) tablet 1  mg  1 mg Oral BID PRN Mardy Legacy, NP       Or   benztropine  mesylate (COGENTIN ) injection 1 mg  1 mg Intramuscular BID PRN Mardy Legacy, NP       benztropine  (COGENTIN ) tablet 1 mg  1 mg Oral BID PRN Cleotilde Hoy HERO, NP       fluPHENAZine  (PROLIXIN ) tablet 10 mg  10 mg Oral QHS Millington, Matthew E, PA-C   10 mg at 10/21/23 2137   fluPHENAZine  decanoate (PROLIXIN ) injection 25 mg  25 mg Intramuscular Q28 days Millington, Matthew E, PA-C   25 mg at 10/21/23 9092   hydrOXYzine  (ATARAX ) tablet 25 mg  25 mg Oral TID PRN Mardy Legacy, NP   25 mg at 10/06/23 2134   magnesium  hydroxide (MILK OF MAGNESIA) suspension 30 mL  30 mL Oral Daily PRN Mardy Legacy, NP       OLANZapine  (ZYPREXA ) injection 10 mg  10 mg Intramuscular TID PRN Mardy Legacy, NP       OLANZapine  (ZYPREXA ) injection 5 mg  5 mg  Intramuscular TID PRN Mardy Legacy, NP       OLANZapine  (ZYPREXA ) tablet 10 mg  10 mg Oral BID Cleotilde Hoy HERO, NP   10 mg at 10/22/23 9142   OLANZapine  zydis (ZYPREXA ) disintegrating tablet 5 mg  5 mg Oral TID PRN Mardy Legacy, NP       traZODone  (DESYREL ) tablet 50 mg  50 mg Oral QHS PRN Millington, Matthew E, PA-C   50 mg at 10/21/23 2137    Lab Results:  No results found for this or any previous visit (from the past 48 hours).   Blood Alcohol level:  Lab Results  Component Value Date   [facility] <15 10/01/2023   ETH <10 01/18/2023    Metabolic Disorder Labs: Lab Results  Component Value Date   HGBA1C 5.6 10/05/2023   MPG 114.02 10/05/2023   MPG 123 04/05/2021   Lab Results  Component Value Date   PROLACTIN 25.5 (H) 12/25/2015   Lab Results  Component Value Date   CHOL 191 10/05/2023   TRIG 193 (H) 10/05/2023   HDL 49 10/05/2023   CHOLHDL 3.9 10/05/2023   VLDL 39 10/05/2023   LDLCALC 103 (H) 10/05/2023   LDLCALC 86 04/05/2021      Psychiatric Specialty Exam:  Presentation  General Appearance:  Fairly Groomed  Eye Contact: Fair  Speech: Normal Rate  Speech Volume: Normal    Mood and Affect  Mood: Depressed  Affect: Flat   Thought Process  Thought Processes: Disorganized  Descriptions of Associations:Loose  Orientation:Full (Time, Place and Person) (disoriented to his personal and psychiatric history)  Thought Content:Illogical  Hallucinations:Hallucinations: Auditory      Ideas of Reference:Other (comment) (delusions)  Suicidal Thoughts:Suicidal Thoughts: No      Homicidal Thoughts:Homicidal Thoughts: No       Sensorium  Memory: Remote Poor; Immediate Poor  Judgment: Poor  Insight: Poor   Executive Functions  Concentration: Fair  Attention Span: Poor  Recall: Good  Fund of Knowledge: Good  Language: Fair   Psychomotor Activity  Psychomotor Activity: Psychomotor Activity:  Normal     Musculoskeletal: Strength & Muscle Tone: within normal limits Gait & Station: normal Assets  Assets: Housing; Social Support; Resilience    Physical Exam: Physical Exam Vitals and nursing note reviewed.  HENT:     Head: Atraumatic.  Eyes:     Extraocular Movements: Extraocular movements intact.  Pulmonary:     Effort: Pulmonary  effort is normal.  Neurological:     Mental Status: He is alert.     Blood pressure 109/79, pulse 68, temperature (!) 97.3 F (36.3 C), resp. rate 16, height 5' 6 (1.676 m), weight 63 kg, SpO2 98%. Body mass index is 22.44 kg/m.  Diagnosis: Principal Problem:   Schizophrenia (HCC)  Collateral report confirms a long-standing psychiatric history with prior functioning well above current baseline. Based on this, continued inpatient care is clearly indicated. On October 21, 2023, patient received Prolixin  Deconate 25 mg IM to initiate long-acting coverage. In addition, Prolixin  oral was increased to 10 mg PO BID for ongoing stabilization. Zyprexa  10 mg PO BID will be continued at this time. No adverse effects reported or noted. A disability application and social work referral will be initiated to explore housing and long-term psychiatric support. [facility] [place] [place] will be requested to clarify prior medication history. Patient to be educated about diagnosis and plan of care as tolerated.  Patient presentation is meeting diagnostic criteria for Schizophrenia, based on significant history of psychosis, poor insight, disorganization, and cognitive impairment. There is no clear mood component present during this admission beyond historical documentation.Current symptoms are consistent with psychotic illness and cognitive disorganization, likely worsened by prolonged medication noncompliance and long-term substance use, though no evidence suggests substance-induced psychosis.  PLAN: Safety and Monitoring:  -- Voluntary admission to  inpatient psychiatric unit for safety, stabilization and treatment  -- Daily contact with patient to assess and evaluate symptoms and progress in treatment  -- Patient's case to be discussed in multi-disciplinary team meeting  -- Observation Level : q15 minute checks  -- Vital signs:  q12 hours  -- Precautions: suicide, elopement, and assault -- Encouraged patient to participate in unit milieu and in scheduled group therapies   2. Psychiatric Diagnoses and Treatment:   Psychiatric Plan and Interventions: Continue Zyprexa  to 10 mg PO BID Prolixin  Decanoate 25 mg administered on 10/21/2023 Can be readministered in 14-28 days depending on tolerability. Continue Prolixin  10 mg at bedtime x14 days Once LAI reaches steady state with continued oral dose, daily dose will be approximately 40mg  po per day which is equivalent to maximum recommended dose. Will observe for side effects and monitor for symptom improvement. Remains of concern current presentation seems to be significantly below patients reported psychiatric baseline.    -- The risks/benefits/side-effects/alternatives to this medication were discussed in detail with the patient and time was given for questions. The patient consents to medication trial.                -- Metabolic profile and EKG monitoring obtained while on an atypical antipsychotic (BMI: Lipid Panel: HbgA1c: QTc:394)             -- Encouraged patient to participate in unit milieu and in scheduled group therapies                            3. Medical Issues Being Addressed:   no acute concerns   4. Discharge Planning:   -- A awaiting APS response             -- Social work and case management to assist with discharge planning and identification of hospital follow-up needs prior to discharge             -- Estimated LOS: 5-7 days             -- Discharge Concerns: Need  to establish a safety plan; Medication compliance and effectiveness             -- Discharge Goals:  Return home with outpatient referrals follow ups Hoy CHRISTELLA Pinal, NP 10/22/2023, 1:33 PM

## 2023-10-22 NOTE — Progress Notes (Signed)
 Roger [name] remains medications compliant. He was pleasant on engagement, preoccupied in thoughts but denied AVH, SI/HI, depression and anxiety.  Pt continues to randomly pace the hall.  He states, I am still here, I don't know when I am going home.  I took the injection.   10/21/23 2200  Psych Admission Type (Psych Patients Only)  Admission Status Involuntary  Psychosocial Assessment  Patient Complaints None  Eye Contact Fair  Facial Expression Flat  Affect Appropriate to circumstance  Speech Logical/coherent  Interaction Assertive  Motor Activity Pacing  Appearance/Hygiene Unremarkable  Behavior Characteristics Cooperative;Appropriate to situation  Mood Pleasant  Aggressive Behavior  Effect No apparent injury  Thought Process  Coherency WDL  Content WDL  Delusions None reported or observed  Perception WDL  Hallucination None reported or observed  Judgment WDL  Confusion None  Danger to Self  Current suicidal ideation? Denies  Danger to Others  Danger to Others None reported or observed   Problem: Education: Goal: Knowledge of [place] [facility] Education information/materials will improve Outcome: Progressing Goal: Emotional status will improve Outcome: Progressing Goal: Mental status will improve Outcome: Progressing Goal: Verbalization of understanding the information provided will improve Outcome: Progressing   Problem: Activity: Goal: Interest or engagement in activities will improve Outcome: Progressing Goal: Sleeping patterns will improve Outcome: Progressing   Problem: Coping: Goal: Ability to verbalize frustrations and anger appropriately will improve Outcome: Progressing Goal: Ability to demonstrate self-control will improve Outcome: Progressing

## 2023-10-23 MED ORDER — FLUPHENAZINE HCL 5 MG PO TABS
5.0000 mg | ORAL_TABLET | Freq: Three times a day (TID) | ORAL | Status: DC
  Administered 2023-10-24 (×2): 5 mg via ORAL
  Filled 2023-10-23 (×3): qty 1

## 2023-10-23 NOTE — BHH Counselor (Signed)
 CSW reached out to APS caseworker, Harlene Iba [phone]) to get an update on the case. Iba stated that pt can return home if his grandmother is open to him returning there. She stated that they will continue to follow up with him from home. No other concerns expressed. Contact ended without incident.   Nadara SAUNDERS. Chaim, MSW, LCSW, LCAS 10/23/2023 10:06 AM

## 2023-10-23 NOTE — Progress Notes (Signed)
 [facility] MD Progress Note  10/23/2023 10:40 AM Roger [name]  MRN:  [id number]  The patient is a 33 year old man with chart history of bipolar vs schizoaffective disorder; hx of polysubstance abuse, depression; he presented to ED after, reportedly, his grandmother contacted crisis services due to reported concerns re: patient responding to internal stimuli, not bathing, trespassing on church property, and digging a large hole in the backyard last week with unclear intent. Grandmother has expressed concerns with patient being discharged and, per chart, she is pursuing legal guardianship for patient via APS with hope for long-term placement. Patient was admitted to [facility] unit for psychosis . On exam today patient is disengaged and internally preoccupied.  He requires frequent redirection and is easily redirectable.  He is alert and oriented to location and self.  Indicates he does not know why he is in the hospital when questioned specifically about taking the whole he indicates he wanted to take a swim and hide from the world.  He denies SI HI and AVH however he is observed responding to internal stimuli and has discussion with self during the interview.  He is a poor historian and is unable to meaningfully fully participate in the interview given his internal preoccupations.  He denied substance use UDS was positive for THC.  He denied previous mental health admissions however they are found on chart review.  He endorses some paranoia but does not elaborate.  Per chart review there is a history of prior suicide attempts.  Given current psychosis patient requires ongoing inpatient psychiatric hospitalization for medication management and stabilization due to impaired judgment, danger to self, and danger to others.   Subjective:  Chart reviewed, case discussed in multidisciplinary meeting, patient seen during rounds.   Patient seen today for follow-up psychiatric evaluation. Upon approach, he greets this  provider and is cooperative throughout the interview. He denies any new or changing psychiatric symptoms since prior assessment. He denies suicidal or homicidal ideation, denies hallucinations, paranoia, or delusions, and does not appear anxious or internally preoccupied.  During review of psychiatric history, the patient reports he has lived with his grandmother his entire life and graduated from [employer] in 2007. He recalls being hospitalized for psychiatric reasons "a few times" but is unable to recall any specific diagnoses. He reports being admitted to the current setting after digging a hole in his backyard but cannot elaborate on the events that led to hospitalization or the reasons behind his behavior.  He denies medication side effects and acknowledges current medication regimen. This provider reviewed his clinical response to Prolixin , noting some improvement since its initiation. The patient was receptive to this discussion and agreed to continued monitoring and dose titration.  10/22/23: Collateral information was obtained from patient's grandmother, [name] [phone].  Despite patient's denial of any psychiatric history, collateral obtained from his grandmother, [name], confirms a long-standing diagnosis of schizophrenia beginning approximately 10 years ago, with multiple prior inpatient psychiatric admissions, most recently at [facility] over one year ago. She describes a marked clinical decline since his last hospitalization, with loss of memory, inability to engage in reality-based conversation, and worsening confusion -- symptoms that are new and not representative of his prior baseline. She reports that he has refused medications for over a year, resulting in decompensation. Grandmother states she can no longer care for him and confirms he has no income, no benefits, and no social support.     Sleep: Fair  Appetite:  Fair  Past  Psychiatric History: see h&P Family History:   Family History  Problem Relation Age of Onset   Alcohol abuse Father    Social History:  Social History   Substance and Sexual Activity  Alcohol Use Yes   Alcohol/week: 1.0 standard drink of alcohol   Types: 1 Shots of liquor per week     Social History   Substance and Sexual Activity  Drug Use Yes   Types: Amphetamines, Cocaine, Marijuana, Benzodiazepines, MDMA (Ecstacy), Hydrocodone   Comment: only weed currently    Social History   Socioeconomic History   Marital status: Single    Spouse name: Not on file   Number of children: Not on file   Years of education: Not on file   Highest education level: Not on file  Occupational History   Not on file  Tobacco Use   Smoking status: Former    Current packs/day: 0.50    Average packs/day: 0.5 packs/day for 10.0 years (5.0 ttl pk-yrs)    Types: Cigarettes   Smokeless tobacco: Former    Quit date: 11/24/2014  Vaping Use   Vaping status: Never Used  Substance and Sexual Activity   Alcohol use: Yes    Alcohol/week: 1.0 standard drink of alcohol    Types: 1 Shots of liquor per week   Drug use: Yes    Types: Amphetamines, Cocaine, Marijuana, Benzodiazepines, MDMA (Ecstacy), Hydrocodone    Comment: only weed currently   Sexual activity: Yes    Birth control/protection: None  Other Topics Concern   Not on file  Social History Narrative   Not on file   Social Drivers of Health   Financial Resource Strain: Not on file  Food Insecurity: No Food Insecurity (10/02/2023)   Hunger Vital Sign    Worried About Running Out of Food in the Last Year: Never true    Ran Out of Food in the Last Year: Never true  Transportation Needs: No Transportation Needs (10/02/2023)   PRAPARE - Administrator, Civil Service (Medical): No    Lack of Transportation (Non-Medical): No  Physical Activity: Not on file  Stress: Not on file  Social Connections: Not on file   Past Medical History:  Past Medical History:  Diagnosis Date    Depression    Headache    Substance abuse (HCC)     Past Surgical History:  Procedure Laterality Date   FEMUR FRACTURE SURGERY      Current Medications: Current Facility-Administered Medications  Medication Dose Route Frequency Provider Last Rate Last Admin   acetaminophen  (TYLENOL ) tablet 650 mg  650 mg Oral Q6H PRN Mardy Legacy, NP   650 mg at 10/21/23 1123   alum & mag hydroxide-simeth (MAALOX/MYLANTA) 200-200-20 MG/5ML suspension 30 mL  30 mL Oral Q4H PRN Mardy Legacy, NP       benztropine  (COGENTIN ) tablet 1 mg  1 mg Oral BID PRN Mardy Legacy, NP       Or   benztropine  mesylate (COGENTIN ) injection 1 mg  1 mg Intramuscular BID PRN Mardy Legacy, NP       benztropine  (COGENTIN ) tablet 1 mg  1 mg Oral BID PRN Cleotilde Hoy HERO, NP       fluPHENAZine  (PROLIXIN ) tablet 10 mg  10 mg Oral QHS Millington, Matthew E, PA-C   10 mg at 10/22/23 2117   fluPHENAZine  decanoate (PROLIXIN ) injection 25 mg  25 mg Intramuscular Q28 days Millington, Matthew E, PA-C   25 mg at 10/21/23 9092   hydrOXYzine  (  ATARAX ) tablet 25 mg  25 mg Oral TID PRN Mardy Legacy, NP   25 mg at 10/06/23 2134   magnesium  hydroxide (MILK OF MAGNESIA) suspension 30 mL  30 mL Oral Daily PRN Mardy Legacy, NP       OLANZapine  (ZYPREXA ) injection 10 mg  10 mg Intramuscular TID PRN Mardy Legacy, NP       OLANZapine  (ZYPREXA ) injection 5 mg  5 mg Intramuscular TID PRN Mardy Legacy, NP       OLANZapine  (ZYPREXA ) tablet 10 mg  10 mg Oral BID Cleotilde Hoy HERO, NP   10 mg at 10/23/23 9240   OLANZapine  zydis (ZYPREXA ) disintegrating tablet 5 mg  5 mg Oral TID PRN Mardy Legacy, NP       traZODone  (DESYREL ) tablet 50 mg  50 mg Oral QHS PRN Millington, Matthew E, PA-C   50 mg at 10/22/23 2117    Lab Results:  No results found for this or any previous visit (from the past 48 hours).   Blood Alcohol level:  Lab Results  Component Value Date   [facility] <15 10/01/2023   ETH <10 01/18/2023    Metabolic  Disorder Labs: Lab Results  Component Value Date   HGBA1C 5.6 10/05/2023   MPG 114.02 10/05/2023   MPG 123 04/05/2021   Lab Results  Component Value Date   PROLACTIN 25.5 (H) 12/25/2015   Lab Results  Component Value Date   CHOL 191 10/05/2023   TRIG 193 (H) 10/05/2023   HDL 49 10/05/2023   CHOLHDL 3.9 10/05/2023   VLDL 39 10/05/2023   LDLCALC 103 (H) 10/05/2023   LDLCALC 86 04/05/2021      Psychiatric Specialty Exam:  Presentation  General Appearance:  Fairly Groomed  Eye Contact: Fair  Speech: Normal Rate  Speech Volume: Normal    Mood and Affect  Mood: Depressed  Affect: Flat   Thought Process  Thought Processes: Disorganized  Descriptions of Associations:Loose  Orientation:Full (Time, Place and Person) (disoriented to his personal and psychiatric history)  Thought Content:Illogical  Hallucinations:Hallucinations: Auditory      Ideas of Reference:Other (comment) (delusions)  Suicidal Thoughts:Suicidal Thoughts: No      Homicidal Thoughts:Homicidal Thoughts: No       Sensorium  Memory: Remote Poor; Immediate Poor  Judgment: Poor  Insight: Poor   Executive Functions  Concentration: Fair  Attention Span: Poor  Recall: Good  Fund of Knowledge: Good  Language: Fair   Psychomotor Activity  Psychomotor Activity: Psychomotor Activity: Normal     Musculoskeletal: Strength & Muscle Tone: within normal limits Gait & Station: normal Assets  Assets: Housing; Social Support; Resilience    Physical Exam: Physical Exam Vitals and nursing note reviewed.  HENT:     Head: Atraumatic.  Eyes:     Extraocular Movements: Extraocular movements intact.  Pulmonary:     Effort: Pulmonary effort is normal.  Neurological:     Mental Status: He is alert.     Blood pressure 107/88, pulse 88, temperature (!) 97.3 F (36.3 C), resp. rate 16, height 5' 6 (1.676 m), weight 63 kg, SpO2 99%. Body mass index is  22.44 kg/m.  Diagnosis: Principal Problem:   Schizophrenia (HCC)  Collateral report confirms a long-standing psychiatric history with prior functioning well above current baseline. Based on this, continued inpatient care is clearly indicated. On October 21, 2023, patient received Prolixin  Deconate 25 mg IM to initiate long-acting coverage. In addition, Prolixin  oral was increased to 10 mg PO BID for ongoing stabilization. Zyprexa   10 mg PO BID will be continued at this time. No adverse effects reported or noted. A disability application and social work referral will be initiated to explore housing and long-term psychiatric support. Pharmacy records from [facility] [place], [place]) will be requested to clarify prior medication history. Patient to be educated about diagnosis and plan of care as tolerated.  Patient presentation is meeting diagnostic criteria for Schizophrenia, based on significant history of psychosis, poor insight, disorganization, and cognitive impairment. There is no clear mood component present during this admission beyond historical documentation.Current symptoms are consistent with psychotic illness and cognitive disorganization, likely worsened by prolonged medication noncompliance and long-term substance use, though no evidence suggests substance-induced psychosis.  Patient presentation remains consistent with chronic psychotic illness characterized by limited insight, impaired recall of psychiatric history, and ongoing stabilization with antipsychotic treatment. He continues to deny acute symptoms of psychosis or mood disturbance. Affect remains flat but mood is reported as euthymic. The patient appears behaviorally stable on the unit and cooperative with care.  Initial response to Prolixin  is noted, and treatment will continue with close monitoring for further improvement. Risk level remains low, and patient denies current suicidal or homicidal ideation. No change in point of care is indicated at  this time.  PLAN: Safety and Monitoring:  -- Voluntary admission to inpatient psychiatric unit for safety, stabilization and treatment  -- Daily contact with patient to assess and evaluate symptoms and progress in treatment  -- Patient's case to be discussed in multi-disciplinary team meeting  -- Observation Level : q15 minute checks  -- Vital signs:  q12 hours  -- Precautions: suicide, elopement, and assault -- Encouraged patient to participate in unit milieu and in scheduled group therapies   2. Psychiatric Diagnoses and Treatment:   Psychiatric Plan and Interventions: Continue Zyprexa  to 10 mg PO BID Prolixin  Decanoate 25 mg administered on 10/21/2023 Can be readministered in 14-28 days depending on tolerability. Continue Prolixin  10 mg at bedtime x14 days Once LAI reaches steady state with continued oral dose, daily dose will be approximately 40mg  po per day which is equivalent to maximum recommended dose. Will observe for side effects and monitor for symptom improvement. Remains of concern current presentation seems to be significantly below patients reported psychiatric baseline.    -- The risks/benefits/side-effects/alternatives to this medication were discussed in detail with the patient and time was given for questions. The patient consents to medication trial.                -- Metabolic profile and EKG monitoring obtained while on an atypical antipsychotic (BMI: Lipid Panel: HbgA1c: QTc:394)             -- Encouraged patient to participate in unit milieu and in scheduled group therapies                            3. Medical Issues Being Addressed:   no acute concerns   4. Discharge Planning:   -- A awaiting APS response             -- Social work and case management to assist with discharge planning and identification of hospital follow-up needs prior to discharge             -- Estimated LOS: 5-7 days             -- Discharge Concerns: Need to establish a safety plan;  Medication compliance and effectiveness             --  Discharge Goals: Return home with outpatient referrals follow ups Hoy CHRISTELLA Pinal, NP 10/23/2023, 10:40 AM

## 2023-10-23 NOTE — Group Note (Signed)
 Date:  10/23/2023 Time:  4:00 PM  Group Topic/Focus:  Personal Choices and Values:   The focus of this group is to help patients assess and explore the importance of values in their lives, how their values affect their decisions, how they express their values and what opposes their expression.    Participation Level:  Did Not Attend   Roger [name] 10/23/2023, 4:00 PM

## 2023-10-23 NOTE — BH IP Treatment Plan (Signed)
 Interdisciplinary Treatment and Diagnostic Plan Update  10/23/2023 Time of Session: 2:00PM Roger [name] MRN: [id number]  Principal Diagnosis: Schizophrenia [facility])  Secondary Diagnoses: Principal Problem:   Schizophrenia (HCC)   Current Medications:  Current Facility-Administered Medications  Medication Dose Route Frequency Provider Last Rate Last Admin   acetaminophen  (TYLENOL ) tablet 650 mg  650 mg Oral Q6H PRN Mardy Legacy, NP   650 mg at 10/21/23 1123   alum & mag hydroxide-simeth (MAALOX/MYLANTA) 200-200-20 MG/5ML suspension 30 mL  30 mL Oral Q4H PRN Mardy Legacy, NP       benztropine  (COGENTIN ) tablet 1 mg  1 mg Oral BID PRN Mardy Legacy, NP       Or   benztropine  mesylate (COGENTIN ) injection 1 mg  1 mg Intramuscular BID PRN Mardy Legacy, NP       benztropine  (COGENTIN ) tablet 1 mg  1 mg Oral BID PRN Cleotilde Hoy HERO, NP       fluPHENAZine  (PROLIXIN ) tablet 10 mg  10 mg Oral QHS Cleotilde Hoy HERO, NP   10 mg at 10/22/23 2117   [START ON 10/24/2023] fluPHENAZine  (PROLIXIN ) tablet 5 mg  5 mg Oral TID Cleotilde Hoy HERO, NP       fluPHENAZine  decanoate (PROLIXIN ) injection 25 mg  25 mg Intramuscular Q28 days Millington, Matthew E, PA-C   25 mg at 10/21/23 9092   hydrOXYzine  (ATARAX ) tablet 25 mg  25 mg Oral TID PRN Mardy Legacy, NP   25 mg at 10/06/23 2134   magnesium  hydroxide (MILK OF MAGNESIA) suspension 30 mL  30 mL Oral Daily PRN Mardy Legacy, NP       OLANZapine  (ZYPREXA ) injection 10 mg  10 mg Intramuscular TID PRN Mardy Legacy, NP       OLANZapine  (ZYPREXA ) injection 5 mg  5 mg Intramuscular TID PRN Mardy Legacy, NP       OLANZapine  (ZYPREXA ) tablet 10 mg  10 mg Oral BID Cleotilde Hoy HERO, NP   10 mg at 10/23/23 0759   OLANZapine  zydis (ZYPREXA ) disintegrating tablet 5 mg  5 mg Oral TID PRN Mardy Legacy, NP       traZODone  (DESYREL ) tablet 50 mg  50 mg Oral QHS PRN Millington, Matthew E, PA-C   50 mg at 10/22/23 2117   PTA  Medications: No medications prior to admission.    Patient Stressors: Financial difficulties   Medication change or noncompliance   Substance abuse    Patient Strengths: Physical Health   Treatment Modalities: Medication Management, Group therapy, Case management,  1 to 1 session with clinician, Psychoeducation, Recreational therapy.   Physician Treatment Plan for Primary Diagnosis: Schizophrenia (HCC) Long Term Goal(s): Improvement in symptoms so as ready for discharge   Short Term Goals: Ability to identify changes in lifestyle to reduce recurrence of condition will improve Ability to verbalize feelings will improve Ability to maintain clinical measurements within normal limits will improve Compliance with prescribed medications will improve  Medication Management: Evaluate patient's response, side effects, and tolerance of medication regimen.  Therapeutic Interventions: 1 to 1 sessions, Unit Group sessions and Medication administration.  Evaluation of Outcomes: Progressing  Physician Treatment Plan for Secondary Diagnosis: Principal Problem:   Schizophrenia (HCC)  Long Term Goal(s): Improvement in symptoms so as ready for discharge   Short Term Goals: Ability to identify changes in lifestyle to reduce recurrence of condition will improve Ability to verbalize feelings will improve Ability to maintain clinical measurements within normal limits will improve Compliance with prescribed medications will improve  Medication Management: Evaluate patient's response, side effects, and tolerance of medication regimen.  Therapeutic Interventions: 1 to 1 sessions, Unit Group sessions and Medication administration.  Evaluation of Outcomes: Progressing   RN Treatment Plan for Primary Diagnosis: Schizophrenia (HCC) Long Term Goal(s): Knowledge of disease and therapeutic regimen to maintain health will improve  Short Term Goals: Ability to demonstrate self-control, Ability to  participate in decision making will improve, Ability to verbalize feelings will improve, Ability to disclose and discuss suicidal ideas, Ability to identify and develop effective coping behaviors will improve, and Compliance with prescribed medications will improve  Medication Management: RN will administer medications as ordered by provider, will assess and evaluate patient's response and provide education to patient for prescribed medication. RN will report any adverse and/or side effects to prescribing provider.  Therapeutic Interventions: 1 on 1 counseling sessions, Psychoeducation, Medication administration, Evaluate responses to treatment, Monitor vital signs and CBGs as ordered, Perform/monitor CIWA, COWS, AIMS and Fall Risk screenings as ordered, Perform wound care treatments as ordered.  Evaluation of Outcomes: Progressing   LCSW Treatment Plan for Primary Diagnosis: Schizophrenia (HCC) Long Term Goal(s): Safe transition to appropriate next level of care at discharge, Engage patient in therapeutic group addressing interpersonal concerns.  Short Term Goals: Engage patient in aftercare planning with referrals and resources, Increase social support, Increase ability to appropriately verbalize feelings, Increase emotional regulation, Facilitate acceptance of mental health diagnosis and concerns, and Increase skills for wellness and recovery  Therapeutic Interventions: Assess for all discharge needs, 1 to 1 time with Social worker, Explore available resources and support systems, Assess for adequacy in community support network, Educate family and significant other(s) on suicide prevention, Complete Psychosocial Assessment, Interpersonal group therapy.  Evaluation of Outcomes: Progressing   Progress in Treatment: Attending groups: No. Participating in groups: No. Taking medication as prescribed: Yes. Toleration medication: Yes. Family/Significant other contact made: Yes, individual(s)  contacted:  SPE completed with the patient's grandmother.  Patient understands diagnosis: No. Discussing patient identified problems/goals with staff: Yes. Medical problems stabilized or resolved: Yes. Denies suicidal/homicidal ideation: Yes. Issues/concerns per patient self-inventory: No. Other: none  New problem(s) identified: No, Describe:  none identified.  Update 10/08/2023:  No changes at this time. Update 06/28.25: No changes at this time. Update 10/18/2023: No changes at this time.  Update 10/23/2023: No changes at this time.     New Short Term/Long Term Goal(s): elimination of symptoms of psychosis, medication management for mood stabilization; elimination of SI thoughts; development of comprehensive mental wellness/sobriety plan.  Update 10/08/2023:  No changes at this time.   Update 06/28.25: No changes at this time. Update 10/18/2023: No changes at this time.  Update 10/23/2023: No changes at this time.      Patient Goals:  Pt denied any goals for treatment.  Update 10/08/2023:  No changes at this time.  Update 06/28.25: No changes at this time. Update 10/18/2023: No changes at this time.  Update 10/23/2023: No changes at this time.     Discharge Plan or Barriers: CSW will assist pt with development of an appropriate aftercare/discharge plan. Update 10/08/2023:  Patient's grandmother has reported that the patient can not return to her home.  Unclear at this time where the patient will be discharged too.   Update 06/28.25: No changes at this time. Update 10/18/2023: Patient remains safe on the unit. Unclear at this time where the patient will discharge due. Patient appeared agreeable to a LAI which would support returning to grandmothers home, though LAI has  not been given yet. APS remains involved and it is unclear what level of involvement will look like. Update 10/23/2023: Patient remains safe on the unit.  Patient has begun taking his LAI.  CSW to assist in scheduling follow up to continue with injections.      Reason for Continuation of Hospitalization: Delusions  Hallucinations Medication stabilization   Estimated Length of Stay: 1-7 days  Update 10/08/2023:  TBD Update 06/28.25: TBD  Update 10/18/2023: TBD Update 10/23/2023: TBD    Last 3 [place] Suicide Severity Risk Score: Flowsheet Row Admission (Current) from 10/02/2023 in [facility]PATIENT [facility] ED from 10/01/2023 in [facility] Emergency Department at [facility] ED from 01/18/2023 in [facility] Emergency Department at [facility]  C-SSRS RISK CATEGORY No Risk No Risk No Risk    Last PHQ 2/9 Scores:     No data to display          Scribe for Treatment Team: Sherryle JINNY Paola KEN 10/23/2023 4:38 PM

## 2023-10-23 NOTE — Plan of Care (Signed)
   Problem: Education: Goal: Emotional status will improve Outcome: Progressing Goal: Mental status will improve Outcome: Progressing

## 2023-10-23 NOTE — Progress Notes (Signed)
   10/23/23 1000  Psych Admission Type (Psych Patients Only)  Admission Status Involuntary  Psychosocial Assessment  Patient Complaints None  Eye Contact Fair  Facial Expression Other (Comment) (WNL)  Affect Appropriate to circumstance  Speech Logical/coherent;Soft  Interaction Minimal;Isolative  Motor Activity Slow  Appearance/Hygiene Unremarkable  Behavior Characteristics Cooperative  Mood Pleasant  Aggressive Behavior  Effect No apparent injury  Thought Process  Coherency WDL  Content WDL  Delusions None reported or observed  Perception WDL  Hallucination None reported or observed  Judgment Impaired  Confusion None  Danger to Self  Current suicidal ideation? Denies  Danger to Others  Danger to Others None reported or observed

## 2023-10-23 NOTE — Group Note (Signed)
 Date:  10/23/2023 Time:  8:40 PM  Group Topic/Focus:  Building Self Esteem:   The Focus of this group is helping patients become aware of the effects of self-esteem on their lives, the things they and others do that enhance or undermine their self-esteem, seeing the relationship between their level of self-esteem and the choices they make and learning ways to enhance self-esteem. Goals Group:   The focus of this group is to help patients establish daily goals to achieve during treatment and discuss how the patient can incorporate goal setting into their daily lives to aide in recovery. Wrap-Up Group:   The focus of this group is to help patients review their daily goal of treatment and discuss progress on daily workbooks.    Participation Level:  Did Not Attend   Additional Comments:    Kristen VEAR Gibbon 10/23/2023, 8:40 PM

## 2023-10-23 NOTE — Group Note (Signed)
 Date:  10/23/2023 Time:  10:23 AM  Group Topic/Focus:  Building Self Esteem:   The Focus of this group is helping patients become aware of the effects of self-esteem on their lives, the things they and others do that enhance or undermine their self-esteem, seeing the relationship between their level of self-esteem and the choices they make and learning ways to enhance self-esteem.    Participation Level:  Did Not Attend   [name] Danell Vazquez 10/23/2023, 10:23 AM

## 2023-10-23 NOTE — Group Note (Signed)
 LCSW Group Therapy Note  Group Date: 10/23/2023 Start Time: 1300 End Time: 1400   Type of Therapy and Topic:  Group Therapy - Healthy vs Unhealthy Coping Skills  Participation Level:  Did Not Attend   Description of Group The focus of this group was to determine what unhealthy coping techniques typically are used by group members and what healthy coping techniques would be helpful in coping with various problems. Patients were guided in becoming aware of the differences between healthy and unhealthy coping techniques. Patients were asked to identify 2-3 healthy coping skills they would like to learn to use more effectively.  Therapeutic Goals Patients learned that coping is what human beings do all day long to deal with various situations in their lives Patients defined and discussed healthy vs unhealthy coping techniques Patients identified their preferred coping techniques and identified whether these were healthy or unhealthy Patients determined 2-3 healthy coping skills they would like to become more familiar with and use more often. Patients provided support and ideas to each other   Summary of Patient Progress: Patient did not attend group.    Therapeutic Modalities Cognitive Behavioral Therapy Motivational Interviewing  Alveta CHRISTELLA Kerns, LCSWA 10/23/2023  2:01 PM

## 2023-10-23 NOTE — Group Note (Signed)
 Recreation Therapy Group Note   Group Topic:Goal Setting  Group Date: 10/23/2023 Start Time: 1010 End Time: 1100 Facilitators: Celestia Jeoffrey BRAVO, LRT, CTRS Location: Craft Room  Group Description: Product/process development scientist. Patients were given many different magazines, a glue stick, markers, and a piece of cardstock paper. LRT and pts discussed the importance of having goals in life. LRT and pts discussed the difference between short-term and long-term goals, as well as what a SMART goal is. LRT encouraged pts to create a vision board, with images they picked and then cut out with safety scissors from the magazine, for themselves, that capture their short and long-term goals. LRT encouraged pts to show and explain their vision board to the group.   Goal Area(s) Addressed:  Patient will gain knowledge of short vs. long term goals.  Patient will identify goals for themselves. Patient will practice setting SMART goals. Patient will verbalize their goals to LRT and peers.   Affect/Mood: N/A   Participation Level: Did not attend    Clinical Observations/Individualized Feedback: Patient did not attend group.   Plan: Continue to engage patient in RT group sessions 2-3x/week.   Jeoffrey BRAVO Celestia, LRT, CTRS 10/23/2023 1:21 PM

## 2023-10-23 NOTE — [facility] Counselor (Signed)
 CSW met with pt briefly to discuss aftercare. He was informed that this was something that his grandmother wanted him set up with to continue his medication and as part of her requirements for him to return to her home. He voiced understanding and agreement with completing consent for aftercare to maintain his medication. CSW also inquired if pt was ok with contacting the mobile crisis to speak with Richarda regarding his case as she was trying to help pt get transportation to his appointment. He voiced agreement with this as well. No other concerns expressed. Contact ended without incident.   CSW contacted mobile crisis worker, Richarda SAUNDERS [phone]). Contact was unable to be established but HIPAA compliant voicemail left with contact information for follow through.   CSW contacted grandmother, [name] Dollar [phone]) regarding case. She was informed that pt has been placed on an injectable and that CSW is working to get follow up to maintain this in the community. CSW also informed her that he was reaching out to the mobile crisis worker to see about transportation arrangements to get pt to this appointment as well. She shared that this was important as pt would not allow her to take him to appointments. [place] did confirm that pt could return home upon discharge as long as arrangements were made from him to get follow up and the injection. CSW informed her that he was working on these arrangements. No other concerns expressed. Contact ended without incident.   Nadara SAUNDERS. Roger [name], MSW, LCSW, LCAS 10/23/2023 10:45 AM

## 2023-10-23 NOTE — Progress Notes (Signed)
 Roger [name] is a 33 y.o. male patient. No diagnosis found. Past Medical History:  Diagnosis Date   Depression    Headache    Substance abuse (HCC)    Current Facility-Administered Medications  Medication Dose Route Frequency Provider Last Rate Last Admin   acetaminophen  (TYLENOL ) tablet 650 mg  650 mg Oral Q6H PRN Mardy Legacy, NP   650 mg at 10/21/23 1123   alum & mag hydroxide-simeth (MAALOX/MYLANTA) 200-200-20 MG/5ML suspension 30 mL  30 mL Oral Q4H PRN Mardy Legacy, NP       benztropine  (COGENTIN ) tablet 1 mg  1 mg Oral BID PRN Mardy Legacy, NP       Or   benztropine  mesylate (COGENTIN ) injection 1 mg  1 mg Intramuscular BID PRN Mardy Legacy, NP       benztropine  (COGENTIN ) tablet 1 mg  1 mg Oral BID PRN Cleotilde Hoy HERO, NP       NOREEN ON 10/24/2023] fluPHENAZine  (PROLIXIN ) tablet 5 mg  5 mg Oral TID Cleotilde Hoy HERO, NP       fluPHENAZine  decanoate (PROLIXIN ) injection 25 mg  25 mg Intramuscular Q28 days Millington, Matthew E, PA-C   25 mg at 10/21/23 9092   hydrOXYzine  (ATARAX ) tablet 25 mg  25 mg Oral TID PRN Mardy Legacy, NP   25 mg at 10/06/23 2134   magnesium  hydroxide (MILK OF MAGNESIA) suspension 30 mL  30 mL Oral Daily PRN Mardy Legacy, NP       OLANZapine  (ZYPREXA ) injection 10 mg  10 mg Intramuscular TID PRN Mardy Legacy, NP       OLANZapine  (ZYPREXA ) injection 5 mg  5 mg Intramuscular TID PRN Mardy Legacy, NP       OLANZapine  (ZYPREXA ) tablet 10 mg  10 mg Oral BID Cleotilde Hoy HERO, NP   10 mg at 10/23/23 2104   OLANZapine  zydis (ZYPREXA ) disintegrating tablet 5 mg  5 mg Oral TID PRN Mardy Legacy, NP       traZODone  (DESYREL ) tablet 50 mg  50 mg Oral QHS PRN Millington, Matthew E, PA-C   50 mg at 10/23/23 2104   Allergies  Allergen Reactions   Haldol  [Haloperidol  Lactate]     EPS sxs   Principal Problem:   Schizophrenia (HCC)  Blood pressure 115/83, pulse 81, temperature (!) 97.3 F (36.3 C), resp. rate 16,  height 5' 6 (1.676 m), weight 63 kg, SpO2 99%.  Subjective Objective: Vital signs: (most recent): Blood pressure 115/83, pulse 81, temperature (!) 97.3 F (36.3 C), resp. rate 16, height 5' 6 (1.676 m), weight 63 kg, SpO2 99%.    Assessment & Plan  Patient denies SI/HI and anxiety this shift during assessment. Patient was pleasant and cooperative . Patient did not  attend group this evening. He contracted for safety and he verbalized  I am just ready to go home.  Moet Mikulski B Keyston Ardolino 10/23/2023

## 2023-10-24 MED ORDER — FLUPHENAZINE HCL 5 MG PO TABS
10.0000 mg | ORAL_TABLET | Freq: Two times a day (BID) | ORAL | Status: DC
  Administered 2023-10-24 – 2023-10-26 (×4): 10 mg via ORAL
  Filled 2023-10-24 (×5): qty 2

## 2023-10-24 MED ORDER — OLANZAPINE 10 MG PO TABS
10.0000 mg | ORAL_TABLET | Freq: Every day | ORAL | Status: DC
  Administered 2023-10-25: 10 mg via ORAL
  Filled 2023-10-24: qty 1

## 2023-10-24 NOTE — Progress Notes (Signed)
   10/24/23 1300  Psych Admission Type (Psych Patients Only)  Admission Status Involuntary  Psychosocial Assessment  Patient Complaints None  Eye Contact Fair  Facial Expression Flat  Affect Appropriate to circumstance  Speech Logical/coherent  Interaction Minimal  Motor Activity Slow  Appearance/Hygiene Unremarkable  Behavior Characteristics Cooperative  Mood Pleasant  Aggressive Behavior  Effect No apparent injury  Thought Process  Coherency WDL  Content WDL  Delusions None reported or observed  Perception WDL  Hallucination None reported or observed  Judgment Impaired  Confusion None  Danger to Self  Current suicidal ideation? Denies  Danger to Others  Danger to Others None reported or observed   Roger [name] reported a headache today that was effectively managed by PRN Tylenol , but otherwise had no complaints. Observed walking in the unit, but mainly keeps to himself. States that his goal is to get home, and wants to know when this will be possible. Denies SI/SH/HI at this time.

## 2023-10-24 NOTE — Plan of Care (Signed)
  Problem: Education: Goal: Emotional status will improve Outcome: Progressing   Problem: Education: Goal: Mental status will improve Outcome: Progressing   Problem: Education: Goal: Verbalization of understanding the information provided will improve Outcome: Progressing   Problem: Activity: Goal: Sleeping patterns will improve Outcome: Progressing   Problem: Coping: Goal: Ability to demonstrate self-control will improve Outcome: Progressing   Problem: Health Behavior/Discharge Planning: Goal: Compliance with treatment plan for underlying cause of condition will improve Outcome: Progressing

## 2023-10-24 NOTE — Plan of Care (Signed)
   Problem: Education: Goal: Emotional status will improve Outcome: Progressing Goal: Mental status will improve Outcome: Progressing

## 2023-10-24 NOTE — Progress Notes (Signed)
 Roger [name] is a 33 y.o. male patient. No diagnosis found. Past Medical History:  Diagnosis Date   Depression    Headache    Substance abuse (HCC)    Current Facility-Administered Medications  Medication Dose Route Frequency Provider Last Rate Last Admin   acetaminophen  (TYLENOL ) tablet 650 mg  650 mg Oral Q6H PRN Mardy Legacy, NP   650 mg at 10/24/23 1031   alum & mag hydroxide-simeth (MAALOX/MYLANTA) 200-200-20 MG/5ML suspension 30 mL  30 mL Oral Q4H PRN Mardy Legacy, NP       benztropine  (COGENTIN ) tablet 1 mg  1 mg Oral BID PRN Mardy Legacy, NP       Or   benztropine  mesylate (COGENTIN ) injection 1 mg  1 mg Intramuscular BID PRN Mardy Legacy, NP       benztropine  (COGENTIN ) tablet 1 mg  1 mg Oral BID PRN Cleotilde Hoy HERO, NP       fluPHENAZine  (PROLIXIN ) tablet 10 mg  10 mg Oral BID Cleotilde Hoy HERO, NP   10 mg at 10/24/23 1728   fluPHENAZine  decanoate (PROLIXIN ) injection 25 mg  25 mg Intramuscular Q28 days Millington, Matthew E, PA-C   25 mg at 10/21/23 9092   hydrOXYzine  (ATARAX ) tablet 25 mg  25 mg Oral TID PRN Mardy Legacy, NP   25 mg at 10/06/23 2134   magnesium  hydroxide (MILK OF MAGNESIA) suspension 30 mL  30 mL Oral Daily PRN Mardy Legacy, NP       OLANZapine  (ZYPREXA ) injection 10 mg  10 mg Intramuscular TID PRN Mardy Legacy, NP       OLANZapine  (ZYPREXA ) injection 5 mg  5 mg Intramuscular TID PRN Mardy Legacy, NP       [START ON 10/25/2023] OLANZapine  (ZYPREXA ) tablet 10 mg  10 mg Oral QHS Cleotilde Hoy HERO, NP       OLANZapine  zydis (ZYPREXA ) disintegrating tablet 5 mg  5 mg Oral TID PRN Mardy Legacy, NP       traZODone  (DESYREL ) tablet 50 mg  50 mg Oral QHS PRN Millington, Matthew E, PA-C   50 mg at 10/24/23 2106   Allergies  Allergen Reactions   Haldol  [Haloperidol  Lactate]     EPS sxs   Principal Problem:   Schizophrenia (HCC)  Blood pressure 112/73, pulse 72, temperature 97.7 F (36.5 C), temperature source  Oral, resp. rate 17, height 5' 6 (1.676 m), weight 63 kg, SpO2 98%.  Subjective Objective: Vital signs: (most recent): Blood pressure 112/73, pulse 72, temperature 97.7 F (36.5 C), temperature source Oral, resp. rate 17, height 5' 6 (1.676 m), weight 63 kg, SpO2 98%.    Assessment & Plan  Patient denies SI/HI and anxiety this shift during assessment. Patient was pleasant and cooperative . Patient did not  attend group this evening. He contracted for safety and he verbalized . He stated he is being discharged on Friday 10/26/23. Upon assessment, he seem to lack insights when taking about post discharge management of care. He talked about [facility] to put his care together but could not give any context. More education and orientation needs to be done especially about management of long acting IM meds. Roger [name] 10/24/2023

## 2023-10-24 NOTE — [facility] Counselor (Signed)
 CSW met with pt briefly to discuss ACTT services. He was agreeable to referrals being made. No other concerns expressed. Contact ended without incident.   CSW contacted [facility] ACTT lead, TJ Stanfield, and informed of referral. He informed CSW due to upcoming discharge he recommended scheduling pt for hospital follow up appointment. Paola shared that they would get him assessed for services during hospital follow up appointment. No other concerns expressed. Contact ended without incident.   CSW scheduled hospital follow up appointment with [facility]. ACTT referral sent to [facility].  CSW contacted mobile crisis, Richarda to updated regarding aftercare. She was informed of appointment date and time. She shared that she would try to help get pt to appointment. No other concerns expressed. Contact ended without incident.   CSW was contacted by grandmother, [name] Dollar. CSW updated her regarding aftercare appointments and working with Richarda to try to get him some transportation assistance. She voiced understanding. No other concerns expressed. Contact ended without incident.   Nadara SAUNDERS. Chaim, MSW, LCSW, LCAS 10/24/2023 12:54 PM

## 2023-10-24 NOTE — Group Note (Signed)
 Date:  10/24/2023 Time:  9:06 PM  Group Topic/Focus:  Wrap-Up Group:   The focus of this group is to help patients review their daily goal of treatment and discuss progress on daily workbooks.    Participation Level:  Active  Participation Quality:  Appropriate, Attentive, and Sharing  Affect:  Appropriate  Cognitive:  Appropriate  Insight: Appropriate and Good  Engagement in Group:  Limited  Modes of Intervention:  Discussion  Additional Comments:     Kerri Katz 10/24/2023, 9:06 PM

## 2023-10-24 NOTE — Progress Notes (Signed)
 [facility] MD Progress Note  10/24/2023 1:21 PM Roger [name]  MRN:  [id number]  The patient is a 33 year old man with chart history of bipolar vs schizoaffective disorder; hx of polysubstance abuse, depression; he presented to ED after, reportedly, his grandmother contacted crisis services due to reported concerns re: patient responding to internal stimuli, not bathing, trespassing on church property, and digging a large hole in the backyard last week with unclear intent. Grandmother has expressed concerns with patient being discharged and, per chart, she is pursuing legal guardianship for patient via APS with hope for long-term placement. Patient was admitted to [facility] unit for psychosis . On exam today patient is disengaged and internally preoccupied.  He requires frequent redirection and is easily redirectable.  He is alert and oriented to location and self.  Indicates he does not know why he is in the hospital when questioned specifically about taking the whole he indicates he wanted to take a swim and hide from the world.  He denies SI HI and AVH however he is observed responding to internal stimuli and has discussion with self during the interview.  He is a poor historian and is unable to meaningfully fully participate in the interview given his internal preoccupations.  He denied substance use UDS was positive for THC.  He denied previous mental health admissions however they are found on chart review.  He endorses some paranoia but does not elaborate.  Per chart review there is a history of prior suicide attempts.  Given current psychosis patient requires ongoing inpatient psychiatric hospitalization for medication management and stabilization due to impaired judgment, danger to self, and danger to others.   Subjective:  Chart reviewed, case discussed in multidisciplinary meeting, patient seen during rounds.   Patient seen today for follow-up psychiatric evaluation. He denies medication side effects  and there are none noted. He does not report hallucinations, paranoia, or delusional thoughts. He is not overtly psychotic but remains disorganized with limited recollection of both psychiatric and personal history. No suicidal or homicidal ideation is endorsed today.  Patient was noted walking in the hallway with fair hygiene. He presents as calm and not overtly psychotic, with ongoing gross disorganization and poor memory. His affect appears softer, and he smiles slightly during the encounter. He is cooperative and able to engage in discussion, though insight and historical accuracy remain limited.   10/22/23: Collateral information was obtained from patient's grandmother, [name] [phone].  Despite patient's denial of any psychiatric history, collateral obtained from his grandmother, [name], confirms a long-standing diagnosis of schizophrenia beginning approximately 10 years ago, with multiple prior inpatient psychiatric admissions, most recently at [facility] over one year ago. She describes a marked clinical decline since his last hospitalization, with loss of memory, inability to engage in reality-based conversation, and worsening confusion -- symptoms that are new and not representative of his prior baseline. She reports that he has refused medications for over a year, resulting in decompensation. Grandmother states she can no longer care for him and confirms he has no income, no benefits, and no social support.     Sleep: Fair  Appetite:  Fair  Past Psychiatric History: see h&P Family History:  Family History  Problem Relation Age of Onset   Alcohol abuse Father    Social History:  Social History   Substance and Sexual Activity  Alcohol Use Yes   Alcohol/week: 1.0 standard drink of alcohol   Types: 1 Shots of liquor per week  Social History   Substance and Sexual Activity  Drug Use Yes   Types: Amphetamines, Cocaine, Marijuana, Benzodiazepines, MDMA (Ecstacy), Hydrocodone    Comment: only weed currently    Social History   Socioeconomic History   Marital status: Single    Spouse name: Not on file   Number of children: Not on file   Years of education: Not on file   Highest education level: Not on file  Occupational History   Not on file  Tobacco Use   Smoking status: Former    Current packs/day: 0.50    Average packs/day: 0.5 packs/day for 10.0 years (5.0 ttl pk-yrs)    Types: Cigarettes   Smokeless tobacco: Former    Quit date: 11/24/2014  Vaping Use   Vaping status: Never Used  Substance and Sexual Activity   Alcohol use: Yes    Alcohol/week: 1.0 standard drink of alcohol    Types: 1 Shots of liquor per week   Drug use: Yes    Types: Amphetamines, Cocaine, Marijuana, Benzodiazepines, MDMA (Ecstacy), Hydrocodone    Comment: only weed currently   Sexual activity: Yes    Birth control/protection: None  Other Topics Concern   Not on file  Social History Narrative   Not on file   Social Drivers of Health   Financial Resource Strain: Not on file  Food Insecurity: No Food Insecurity (10/02/2023)   Hunger Vital Sign    Worried About Running Out of Food in the Last Year: Never true    Ran Out of Food in the Last Year: Never true  Transportation Needs: No Transportation Needs (10/02/2023)   PRAPARE - Administrator, Civil Service (Medical): No    Lack of Transportation (Non-Medical): No  Physical Activity: Not on file  Stress: Not on file  Social Connections: Not on file   Past Medical History:  Past Medical History:  Diagnosis Date   Depression    Headache    Substance abuse (HCC)     Past Surgical History:  Procedure Laterality Date   FEMUR FRACTURE SURGERY      Current Medications: Current Facility-Administered Medications  Medication Dose Route Frequency Provider Last Rate Last Admin   acetaminophen  (TYLENOL ) tablet 650 mg  650 mg Oral Q6H PRN Mardy Legacy, NP   650 mg at 10/24/23 1031   alum & mag hydroxide-simeth  (MAALOX/MYLANTA) 200-200-20 MG/5ML suspension 30 mL  30 mL Oral Q4H PRN Mardy Legacy, NP       benztropine  (COGENTIN ) tablet 1 mg  1 mg Oral BID PRN Mardy Legacy, NP       Or   benztropine  mesylate (COGENTIN ) injection 1 mg  1 mg Intramuscular BID PRN Mardy Legacy, NP       benztropine  (COGENTIN ) tablet 1 mg  1 mg Oral BID PRN Cleotilde Hoy HERO, NP       fluPHENAZine  (PROLIXIN ) tablet 10 mg  10 mg Oral BID Cleotilde Hoy HERO, NP       fluPHENAZine  decanoate (PROLIXIN ) injection 25 mg  25 mg Intramuscular Q28 days Millington, Matthew E, PA-C   25 mg at 10/21/23 9092   hydrOXYzine  (ATARAX ) tablet 25 mg  25 mg Oral TID PRN Mardy Legacy, NP   25 mg at 10/06/23 2134   magnesium  hydroxide (MILK OF MAGNESIA) suspension 30 mL  30 mL Oral Daily PRN Mardy Legacy, NP       OLANZapine  (ZYPREXA ) injection 10 mg  10 mg Intramuscular TID PRN Mardy Legacy, NP  OLANZapine  (ZYPREXA ) injection 5 mg  5 mg Intramuscular TID PRN Mardy Legacy, NP       [START ON 10/25/2023] OLANZapine  (ZYPREXA ) tablet 10 mg  10 mg Oral QHS Cleotilde Hoy HERO, NP       OLANZapine  zydis (ZYPREXA ) disintegrating tablet 5 mg  5 mg Oral TID PRN Mardy Legacy, NP       traZODone  (DESYREL ) tablet 50 mg  50 mg Oral QHS PRN Millington, Matthew E, PA-C   50 mg at 10/23/23 2104    Lab Results:  No results found for this or any previous visit (from the past 48 hours).   Blood Alcohol level:  Lab Results  Component Value Date   [facility] <15 10/01/2023   ETH <10 01/18/2023    Metabolic Disorder Labs: Lab Results  Component Value Date   HGBA1C 5.6 10/05/2023   MPG 114.02 10/05/2023   MPG 123 04/05/2021   Lab Results  Component Value Date   PROLACTIN 25.5 (H) 12/25/2015   Lab Results  Component Value Date   CHOL 191 10/05/2023   TRIG 193 (H) 10/05/2023   HDL 49 10/05/2023   CHOLHDL 3.9 10/05/2023   VLDL 39 10/05/2023   LDLCALC 103 (H) 10/05/2023   LDLCALC 86 04/05/2021      Psychiatric  Specialty Exam: Appearance: Walking in hallway, fair hygiene Eye Contact: Intermittent but appropriate Speech: Normal rate and rhythm Mood: Neutral to slightly improved Affect: Softer, slight smiling observed Thought Process: Disorganized Thought Content: Not overtly psychotic today; no AVH or delusions reported Insight: Limited Judgment: Impaired due to disorganization Memory: Impaired, limited recollection of history Concentration: Mildly impaired Recall: Poor for personal and psychiatric history Fund of Knowledge: Not formally assessed Language: No deficits noted Psychomotor: Normal Attention: Present but distractible Orientation: Oriented to person, place, and time Sleep: reports good Appetite: stable    Musculoskeletal: Strength & Muscle Tone: within normal limits Gait & Station: normal Assets  Assets: Housing; Social Support; Resilience    Physical Exam: Physical Exam Vitals and nursing note reviewed.  HENT:     Head: Atraumatic.  Eyes:     Extraocular Movements: Extraocular movements intact.  Pulmonary:     Effort: Pulmonary effort is normal.  Neurological:     Mental Status: He is alert.     Blood pressure 112/73, pulse 72, temperature 97.7 F (36.5 C), temperature source Oral, resp. rate 17, height 5' 6 (1.676 m), weight 63 kg, SpO2 98%. Body mass index is 22.44 kg/m.  Diagnosis: Principal Problem:   Schizophrenia (HCC)  PLAN: Safety and Monitoring:  -- Voluntary admission to inpatient psychiatric unit for safety, stabilization and treatment  -- Daily contact with patient to assess and evaluate symptoms and progress in treatment  -- Patient's case to be discussed in multi-disciplinary team meeting  -- Observation Level : q15 minute checks  -- Vital signs:  q12 hours  -- Precautions: suicide, elopement, and assault -- Encouraged patient to participate in unit milieu and in scheduled group therapies   2. Psychiatric Diagnoses and Treatment:   Patient continues to meet DSM-5 criteria for Schizophrenia, based on persistent disorganization, cognitive disruption, and historical psychotic symptoms. While he is no longer exhibiting acute behavioral disturbance, he remains significantly impaired compared to his prior psychiatric baseline, which according to chart and collateral data has been more functional in the past. Slight improvement in affect is noted today. He is tolerating current antipsychotic treatment well and verbalizes understanding of the current plan of care.  ACTT referral is  appropriate given chronic disorganization, medication needs, and lack of insight. Patient will require assertive community-based psychiatric follow-up to support outpatient functioning.  Patient continues to require this acute inpatient psychiatric setting for safety, medication management, and symptom stabilization to address need for continued treatment.  Psychiatric Plan / Interventions Increase Prolixin  to 10 mg PO twice daily (tolerating well; persistent disorganization) Reduce Zyprexa  to 10 mg PO at bedtime only (weaning strategy) Prolixin  Decanoate 25 mg IM administered on 10/21/2023. May be re-administered in 14-28 days depending on tolerability Once long-acting injection (LAI) reaches steady state alongside oral dosing, daily dose will approximate 40 mg PO, aligning with maximum recommended dosage Continue structured observation and assess response to new dosing Discussed plan of care with patient; he verbalized understanding and agreement CSW met with patient and discussed ACTT services; patient was agreeable to referrals ACTT referral sent to [facility] follow-up appointment scheduled Medication education provided including risks, benefits, and side effects Denies medication side effects and there are none noted  -- The risks/benefits/side-effects/alternatives to this medication were discussed in detail with the patient and time was given  for questions. The patient consents to medication trial.  -- Metabolic profile and EKG monitoring obtained while on an atypical antipsychotic (BMI: Lipid Panel: HbgA1c: QTc:394) -- Encouraged patient to participate in unit milieu and in scheduled group therapies                            3. Medical Issues Being Addressed:   no acute concerns   4. Discharge Planning:   -- A awaiting APS response             -- Social work and case management to assist with discharge planning and identification of hospital follow-up needs prior to discharge             -- Estimated LOS: 5-7 days             -- Discharge Concerns: Need to establish a safety plan; Medication compliance and effectiveness             -- Discharge Goals: Return home with outpatient referrals follow ups Hoy CHRISTELLA Pinal, NP 10/24/2023, 1:21 PM

## 2023-10-24 NOTE — Group Note (Signed)
 LCSW Group Therapy Note  Group Date: 10/24/2023 Start Time: 1330 End Time: 1400   Type of Therapy and Topic:  Group Therapy - How To Cope with Nervousness about Discharge   Participation Level:  Did Not Attend   Description of Group This process group involved identification of patients' feelings about discharge. Some of them are scheduled to be discharged soon, while others are new admissions, but each of them was asked to share thoughts and feelings surrounding discharge from the hospital. One common theme was that they are excited at the prospect of going home, while another was that many of them are apprehensive about sharing why they were hospitalized. Patients were given the opportunity to discuss these feelings with their peers in preparation for discharge.  Therapeutic Goals  Patient will identify their overall feelings about pending discharge. Patient will think about how they might proactively address issues that they believe will once again arise once they get home (i.e. with parents). Patients will participate in discussion about having hope for change.   Summary of Patient Progress:   X    Therapeutic Modalities Cognitive Behavioral Therapy   Sherryle JINNY Margo, LCSW 10/24/2023  2:48 PM

## 2023-10-24 NOTE — Group Note (Signed)
 Date:  10/24/2023 Time:  2:20 PM  Group Topic/Focus:  Goals Group:   The focus of this group is to help patients establish daily goals to achieve during treatment and discuss how the patient can incorporate goal setting into their daily lives to aide in recovery.    Participation Level:  Minimal  Participation Quality:  Appropriate  Affect:  Appropriate  Cognitive:  Alert  Insight: Appropriate  Engagement in Group:  Engaged  Modes of Intervention:  Activity, Discussion, and Education  Additional Comments:    Roger [name] 10/24/2023, 2:20 PM

## 2023-10-25 ENCOUNTER — Other Ambulatory Visit: Payer: Self-pay

## 2023-10-25 MED ORDER — FLUPHENAZINE HCL 10 MG PO TABS
10.0000 mg | ORAL_TABLET | Freq: Two times a day (BID) | ORAL | 0 refills | Status: AC
  Filled 2023-10-25 (×2): qty 60, 30d supply, fill #0

## 2023-10-25 MED ORDER — BENZTROPINE MESYLATE 1 MG PO TABS
1.0000 mg | ORAL_TABLET | Freq: Two times a day (BID) | ORAL | 0 refills | Status: AC | PRN
  Filled 2023-10-25 (×2): qty 60, 30d supply, fill #0

## 2023-10-25 MED ORDER — FLUPHENAZINE DECANOATE 25 MG/ML IJ SOLN
25.0000 mg | INTRAMUSCULAR | 0 refills | Status: AC
  Filled 2023-10-25: qty 2, 28d supply, fill #0
  Filled 2023-10-25: qty 5, 70d supply, fill #0

## 2023-10-25 MED ORDER — OLANZAPINE 10 MG PO TABS
10.0000 mg | ORAL_TABLET | Freq: Every day | ORAL | 0 refills | Status: AC
  Filled 2023-10-25 (×2): qty 30, 30d supply, fill #0

## 2023-10-25 NOTE — Group Note (Signed)
 Date:  10/25/2023 Time:  8:32 PM  Group Topic/Focus:  Wrap-Up Group:   The focus of this group is to help patients review their daily goal of treatment and discuss progress on daily workbooks.    Participation Level:  Active  Participation Quality:  Appropriate, Attentive, and Sharing  Affect:  Appropriate  Cognitive:  Alert and Appropriate  Insight: Appropriate and Good  Engagement in Group:  Engaged and Improving  Modes of Intervention:  Clarification, Discussion, Education, Rapport Building, and Support  Additional Comments:     Jolene Guyett 10/25/2023, 8:32 PM

## 2023-10-25 NOTE — Group Note (Signed)
 Date:  10/25/2023 Time:  10:54 AM  Group Topic/Focus:  Dimensions of Wellness:   The focus of this group is to introduce the topic of wellness and discuss the role each dimension of wellness plays in total health.    Participation Level:  Did Not Attend   Roger [name] Iaan Oregel 10/25/2023, 10:54 AM

## 2023-10-25 NOTE — Progress Notes (Signed)
 [facility] Discharge Suicide Risk Assessment   Principal Problem: Schizophrenia [facility]) Discharge Diagnoses: Principal Problem:   Schizophrenia (HCC)   Total Time spent with patient: 30 minutes  Musculoskeletal: Strength & Muscle Tone: within normal limits Gait & Station: normal   Psychiatric Specialty Exam  Presentation  General Appearance:  Fairly Groomed  Eye Contact: Fair  Speech: Normal Rate  Speech Volume: Normal  Handedness: Right   Mood and Affect  Mood: Depressed  Duration of Depression Symptoms: Greater than two weeks  Affect: Flat   Thought Process  Thought Processes: Disorganized  Descriptions of Associations:Loose  Orientation:Full (Time, Place and Person) (disoriented to his personal and psychiatric history)  Thought Content:Illogical  History of Schizophrenia/Schizoaffective disorder:Yes  Duration of Psychotic Symptoms:Greater than six months  Hallucinations:No data recorded denies Ideas of Reference:Other (comment) (delusions)  Suicidal Thoughts:No data recorded Homicidal Thoughts:No data recorded  Sensorium  Memory: Remote Poor; Immediate Poor  Judgment: Poor  Insight: Poor   Executive Functions  Concentration: Fair  Attention Span: Poor  Recall: Good  Fund of Knowledge: Good  Language: Fair   Psychomotor Activity  Psychomotor Activity:No data recorded  Assets  Assets: Housing; Social Support; Resilience   Sleep  Sleep:No data recorded Estimated Sleeping Duration (Last 24 Hours): 9.25-11.25 hours  Physical Exam: Physical Exam ROS Blood pressure 112/74, pulse 78, temperature (!) 97.3 F (36.3 C), resp. rate 20, height 5' 6 (1.676 m), weight 63 kg, SpO2 100%. Body mass index is 22.44 kg/m.  Mental Status Per Nursing Assessment::   On Admission:  NA  Demographic Factors:  Male, Low socioeconomic status, and Unemployed  Loss Factors: Financial problems/change in socioeconomic  status  Historical Factors: psychosis  Risk Reduction Factors:   Sense of responsibility to family, Living with another person, especially a relative, Positive social support, Positive therapeutic relationship, and Positive coping skills or problem solving skills  Continued Clinical Symptoms:  Schizophrenia:   Paranoid or undifferentiated type  Cognitive Features That Contribute To Risk:  None    Suicide Risk:  Minimal: No identifiable suicidal ideation.  Patients presenting with no risk factors but with morbid ruminations; may be classified as minimal risk based on the severity of the depressive symptoms   Follow-up Information     [facility] Follow up.   Why: Your appointment is scheduled for Wednesday, 10/31/23 at 10 AM. Contact information: [address] [place] [address] [phone]         [facility] Follow up.   Why: Referral was made for ACTT services. Contact information: [address] [place] [address] [phone]                 Hoy CHRISTELLA Pinal, NP 10/25/2023, 5:27 PM

## 2023-10-25 NOTE — Progress Notes (Signed)
 [facility] MD Progress Note  10/25/2023 1:52 PM Coury Tyquon Near  MRN:  [id number]  The patient is a 33 year old man with chart history of bipolar vs schizoaffective disorder; hx of polysubstance abuse, depression; he presented to ED after, reportedly, his grandmother contacted crisis services due to reported concerns re: patient responding to internal stimuli, not bathing, trespassing on church property, and digging a large hole in the backyard last week with unclear intent. Grandmother has expressed concerns with patient being discharged and, per chart, she is pursuing legal guardianship for patient via APS with hope for long-term placement. Patient was admitted to [facility] unit for psychosis . On exam today patient is disengaged and internally preoccupied.  He requires frequent redirection and is easily redirectable.  He is alert and oriented to location and self.  Indicates he does not know why he is in the hospital when questioned specifically about taking the whole he indicates he wanted to take a swim and hide from the world.  He denies SI HI and AVH however he is observed responding to internal stimuli and has discussion with self during the interview.  He is a poor historian and is unable to meaningfully fully participate in the interview given his internal preoccupations.  He denied substance use UDS was positive for THC.  He denied previous mental health admissions however they are found on chart review.  He endorses some paranoia but does not elaborate.  Per chart review there is a history of prior suicide attempts.  Given current psychosis patient requires ongoing inpatient psychiatric hospitalization for medication management and stabilization due to impaired judgment, danger to self, and danger to others.   Subjective:  Chart reviewed, case discussed in multidisciplinary meeting, patient seen during rounds.   Patient seen and assessed. Engages freely able to follow conversation. Remains overall  confused related to history but is able to discuss certain aspects such as supportive home with grandmother. He is aware of mental health diagnosis and we review education related to the importance of medication compliance and follow up specifically recommendation to engage with ACTT services, he verbalizes understanding and agrees to plan of care. He denies SI/HI, AVH paranoia and delusions. He is more organized today. We are not anticipating any further medication changes at this time however presentation is not consistent with pt's historical baseline. He will require the support of extensive outpatient team such as ACTT. He denies complaints reports sleep and appetite are stable. He is bathing and participating appropriately on the unit. He is not overtly psychotic.    10/24/23: Patient seen today for follow-up psychiatric evaluation. He denies medication side effects and there are none noted. He does not report hallucinations, paranoia, or delusional thoughts. He is not overtly psychotic but remains disorganized with limited recollection of both psychiatric and personal history. No suicidal or homicidal ideation is endorsed today.  Patient was noted walking in the hallway with fair hygiene. He presents as calm and not overtly psychotic, with ongoing gross disorganization and poor memory. His affect appears softer, and he smiles slightly during the encounter. He is cooperative and able to engage in discussion, though insight and historical accuracy remain limited.   10/22/23: Collateral information was obtained from patient's grandmother, [name] [phone].  Despite patient's denial of any psychiatric history, collateral obtained from his grandmother, [name], confirms a long-standing diagnosis of schizophrenia beginning approximately 10 years ago, with multiple prior inpatient psychiatric admissions, most recently at [facility] over one year ago. She describes a marked  clinical decline since his last  hospitalization, with loss of memory, inability to engage in reality-based conversation, and worsening confusion -- symptoms that are new and not representative of his prior baseline. She reports that he has refused medications for over a year, resulting in decompensation. Grandmother states she can no longer care for him and confirms he has no income, no benefits, and no social support.     Sleep: Fair  Appetite:  Fair  Past Psychiatric History: see h&P Family History:  Family History  Problem Relation Age of Onset   Alcohol abuse Father    Social History:  Social History   Substance and Sexual Activity  Alcohol Use Yes   Alcohol/week: 1.0 standard drink of alcohol   Types: 1 Shots of liquor per week     Social History   Substance and Sexual Activity  Drug Use Yes   Types: Amphetamines, Cocaine, Marijuana, Benzodiazepines, MDMA (Ecstacy), Hydrocodone   Comment: only weed currently    Social History   Socioeconomic History   Marital status: Single    Spouse name: Not on file   Number of children: Not on file   Years of education: Not on file   Highest education level: Not on file  Occupational History   Not on file  Tobacco Use   Smoking status: Former    Current packs/day: 0.50    Average packs/day: 0.5 packs/day for 10.0 years (5.0 ttl pk-yrs)    Types: Cigarettes   Smokeless tobacco: Former    Quit date: 11/24/2014  Vaping Use   Vaping status: Never Used  Substance and Sexual Activity   Alcohol use: Yes    Alcohol/week: 1.0 standard drink of alcohol    Types: 1 Shots of liquor per week   Drug use: Yes    Types: Amphetamines, Cocaine, Marijuana, Benzodiazepines, MDMA (Ecstacy), Hydrocodone    Comment: only weed currently   Sexual activity: Yes    Birth control/protection: None  Other Topics Concern   Not on file  Social History Narrative   Not on file   Social Drivers of Health   Financial Resource Strain: Not on file  Food Insecurity: No Food  Insecurity (10/02/2023)   Hunger Vital Sign    Worried About Running Out of Food in the Last Year: Never true    Ran Out of Food in the Last Year: Never true  Transportation Needs: No Transportation Needs (10/02/2023)   PRAPARE - Administrator, Civil Service (Medical): No    Lack of Transportation (Non-Medical): No  Physical Activity: Not on file  Stress: Not on file  Social Connections: Not on file   Past Medical History:  Past Medical History:  Diagnosis Date   Depression    Headache    Substance abuse (HCC)     Past Surgical History:  Procedure Laterality Date   FEMUR FRACTURE SURGERY      Current Medications: Current Facility-Administered Medications  Medication Dose Route Frequency Provider Last Rate Last Admin   acetaminophen  (TYLENOL ) tablet 650 mg  650 mg Oral Q6H PRN Mardy Legacy, NP   650 mg at 10/25/23 1115   alum & mag hydroxide-simeth (MAALOX/MYLANTA) 200-200-20 MG/5ML suspension 30 mL  30 mL Oral Q4H PRN Mardy Legacy, NP       benztropine  (COGENTIN ) tablet 1 mg  1 mg Oral BID PRN Mardy Legacy, NP       Or   benztropine  mesylate (COGENTIN ) injection 1 mg  1 mg Intramuscular BID PRN Mardy,  Mikaela, NP       benztropine  (COGENTIN ) tablet 1 mg  1 mg Oral BID PRN Cleotilde Hoy HERO, NP       fluPHENAZine  (PROLIXIN ) tablet 10 mg  10 mg Oral BID Cleotilde Hoy HERO, NP   10 mg at 10/25/23 9146   fluPHENAZine  decanoate (PROLIXIN ) injection 25 mg  25 mg Intramuscular Q28 days Millington, Matthew E, PA-C   25 mg at 10/21/23 9092   hydrOXYzine  (ATARAX ) tablet 25 mg  25 mg Oral TID PRN Mardy Legacy, NP   25 mg at 10/06/23 2134   magnesium  hydroxide (MILK OF MAGNESIA) suspension 30 mL  30 mL Oral Daily PRN Mardy Legacy, NP       OLANZapine  (ZYPREXA ) injection 10 mg  10 mg Intramuscular TID PRN Mardy Legacy, NP       OLANZapine  (ZYPREXA ) injection 5 mg  5 mg Intramuscular TID PRN Mardy Legacy, NP       OLANZapine  (ZYPREXA ) tablet 10 mg  10  mg Oral QHS Cleotilde Hoy HERO, NP       OLANZapine  zydis (ZYPREXA ) disintegrating tablet 5 mg  5 mg Oral TID PRN Mardy Legacy, NP       traZODone  (DESYREL ) tablet 50 mg  50 mg Oral QHS PRN Millington, Matthew E, PA-C   50 mg at 10/24/23 2106    Lab Results:  No results found for this or any previous visit (from the past 48 hours).   Blood Alcohol level:  Lab Results  Component Value Date   [facility] <15 10/01/2023   ETH <10 01/18/2023    Metabolic Disorder Labs: Lab Results  Component Value Date   HGBA1C 5.6 10/05/2023   MPG 114.02 10/05/2023   MPG 123 04/05/2021   Lab Results  Component Value Date   PROLACTIN 25.5 (H) 12/25/2015   Lab Results  Component Value Date   CHOL 191 10/05/2023   TRIG 193 (H) 10/05/2023   HDL 49 10/05/2023   CHOLHDL 3.9 10/05/2023   VLDL 39 10/05/2023   LDLCALC 103 (H) 10/05/2023   LDLCALC 86 04/05/2021      Psychiatric Specialty Exam: Appearance: Walking in hallway, fair hygiene Eye Contact: fair Speech: Normal rate and rhythm Mood: Neutral to slightly improved Affect: Softer, slight smiling observed Thought Process: Disorganized Thought Content: Not overtly psychotic today; no AVH or delusions reported Insight: Limited, some improved extremely guarded Judgment: guarded  Memory: Impaired, limited recollection of history Concentration: Mildly impaired-improving Recall: Poor for personal and psychiatric history Fund of Knowledge: Not formally assessed Language: No deficits noted Psychomotor: Normal Attention: fair Orientation: Oriented to person, place, and time Sleep: reports good Appetite: stable    Musculoskeletal: Strength & Muscle Tone: within normal limits Gait & Station: normal Assets  Assets: Housing; Social Support; Resilience    Physical Exam: Physical Exam Vitals and nursing note reviewed.  HENT:     Head: Atraumatic.  Eyes:     Extraocular Movements: Extraocular movements intact.  Pulmonary:      Effort: Pulmonary effort is normal.  Neurological:     Mental Status: He is alert.     Blood pressure 108/74, pulse 70, temperature 97.8 F (36.6 C), resp. rate 20, height 5' 6 (1.676 m), weight 63 kg, SpO2 98%. Body mass index is 22.44 kg/m.  Diagnosis: Principal Problem:   Schizophrenia (HCC)  PLAN: Safety and Monitoring:  -- Voluntary admission to inpatient psychiatric unit for safety, stabilization and treatment  -- Daily contact with patient to assess and evaluate symptoms and  progress in treatment  -- Patient's case to be discussed in multi-disciplinary team meeting  -- Observation Level : q15 minute checks  -- Vital signs:  q12 hours  -- Precautions: suicide, elopement, and assault -- Encouraged patient to participate in unit milieu and in scheduled group therapies   2. Psychiatric Diagnoses and Treatment:  Patient continues to meet DSM-5 criteria for Schizophrenia, based on persistent disorganization, cognitive disruption, and historical psychotic symptoms. While he is no longer exhibiting acute behavioral disturbance, he remains significantly impaired compared to his prior psychiatric baseline, which according to chart and collateral data has been more functional in the past. Slight improvement in affect is noted today. He is tolerating current antipsychotic treatment well and verbalizes understanding of the current plan of care.  ACTT referral is appropriate given chronic disorganization, medication needs, and lack of insight. Patient will require assertive community-based psychiatric follow-up to support outpatient functioning.  Patient continues to require this acute inpatient psychiatric setting for safety, medication management, and symptom stabilization to address need for continued treatment.  Psychiatric Plan / Interventions Increase Prolixin  to 10 mg PO twice daily (tolerating well; persistent disorganization) Reduce Zyprexa  to 10 mg PO at bedtime only will not plan  to wean further at this time Prolixin  Decanoate 25 mg IM administered on 10/21/2023. May be re-administered in 14-28 days depending on tolerability Once long-acting injection (LAI) reaches steady state alongside oral dosing, daily dose will approximate 40 mg PO, aligning with maximum recommended dosage Continue structured observation and assess response to new dosing Discussed plan of care with patient; he verbalized understanding and agreement CSW met with patient and discussed ACTT services; patient was agreeable to referrals ACTT referral sent to [facility] follow-up appointment scheduled Medication education provided including risks, benefits, and side effects Denies medication side effects and there are none noted  -- The risks/benefits/side-effects/alternatives to this medication were discussed in detail with the patient and time was given for questions. The patient consents to medication trial.  -- Metabolic profile and EKG monitoring obtained while on an atypical antipsychotic (BMI: Lipid Panel: HbgA1c: QTc:394) -- Encouraged patient to participate in unit milieu and in scheduled group therapies                            3. Medical Issues Being Addressed:   no acute concerns   4. Discharge Planning:   -- A awaiting APS response             -- Social work and case management to assist with discharge planning and identification of hospital follow-up needs prior to discharge             -- Estimated LOS: 5-7 days, collaborating with SW to secure appropriate outpatient supports              -- Discharge Concerns: Need to establish a safety plan; Medication compliance and effectiveness             -- Discharge Goals: Return home with outpatient referrals follow ups Hoy CHRISTELLA Pinal, NP 10/25/2023, 1:52 PM

## 2023-10-25 NOTE — Plan of Care (Signed)
  Problem: Education: Goal: Knowledge of [place] [facility] Education information/materials will improve Outcome: Progressing Goal: Emotional status will improve Outcome: Progressing Goal: Mental status will improve Outcome: Progressing Goal: Verbalization of understanding the information provided will improve Outcome: Progressing   Problem: Activity: Goal: Interest or engagement in activities will improve Outcome: Progressing Goal: Sleeping patterns will improve Outcome: Progressing   Problem: Coping: Goal: Ability to verbalize frustrations and anger appropriately will improve Outcome: Progressing Goal: Ability to demonstrate self-control will improve Outcome: Progressing   Problem: Health Behavior/Discharge Planning: Goal: Identification of resources available to assist in meeting health care needs will improve Outcome: Progressing Goal: Compliance with treatment plan for underlying cause of condition will improve Outcome: Progressing   Problem: Physical Regulation: Goal: Ability to maintain clinical measurements within normal limits will improve Outcome: Progressing   Problem: Safety: Goal: Periods of time without injury will increase Outcome: Progressing   Problem: Education: Goal: Emotional status will improve Outcome: Progressing Goal: Mental status will improve Outcome: Progressing Goal: Verbalization of understanding the information provided will improve Outcome: Progressing   Problem: Activity: Goal: Interest or engagement in activities will improve Outcome: Progressing Goal: Sleeping patterns will improve Outcome: Progressing   Problem: Education: Goal: Knowledge of General Education information will improve Description: Including pain rating scale, medication(s)/side effects and non-pharmacologic comfort measures Outcome: Progressing   Problem: Health Behavior/Discharge Planning: Goal: Ability to manage health-related needs will improve Outcome:  Progressing   Problem: Clinical Measurements: Goal: Ability to maintain clinical measurements within normal limits will improve Outcome: Progressing Goal: Will remain free from infection Outcome: Progressing Goal: Diagnostic test results will improve Outcome: Progressing Goal: Respiratory complications will improve Outcome: Progressing Goal: Cardiovascular complication will be avoided Outcome: Progressing   Problem: Activity: Goal: Risk for activity intolerance will decrease Outcome: Progressing   Problem: Nutrition: Goal: Adequate nutrition will be maintained Outcome: Progressing   Problem: Coping: Goal: Level of anxiety will decrease Outcome: Progressing   Problem: Elimination: Goal: Will not experience complications related to bowel motility Outcome: Progressing Goal: Will not experience complications related to urinary retention Outcome: Progressing   Problem: Pain Managment: Goal: General experience of comfort will improve and/or be controlled Outcome: Progressing   Problem: Safety: Goal: Ability to remain free from injury will improve Outcome: Progressing   Problem: Skin Integrity: Goal: Risk for impaired skin integrity will decrease Outcome: Progressing

## 2023-10-25 NOTE — Group Note (Signed)
 Recreation Therapy Group Note   Group Topic:General Recreation  Group Date: 10/25/2023 Start Time: 1000 End Time: 1045 Facilitators: Celestia Jeoffrey BRAVO, LRT, CTRS Location: Courtyard  Group Description: [employer]. LRT and patients played games of basketball, drew with chalk, and played corn hole while outside in the courtyard while getting fresh air and sunlight. Music was being played in the background. LRT and peers conversed about different games they have played before, what they do in their free time and anything else that is on their minds. LRT encouraged pts to drink water after being outside, sweating and getting their heart rate up.  Goal Area(s) Addressed: Patient will build on frustration tolerance skills. Patients will partake in a competitive play game with peers. Patients will gain knowledge of new leisure interest/hobby.    Affect/Mood: N/A   Participation Level: Did not attend    Clinical Observations/Individualized Feedback: Patient did not attend group.   Plan: Continue to engage patient in RT group sessions 2-3x/week.   Jeoffrey BRAVO Celestia, LRT, CTRS 10/25/2023 1:54 PM

## 2023-10-25 NOTE — Plan of Care (Signed)
   Problem: Education: Goal: Emotional status will improve Outcome: Progressing Goal: Mental status will improve Outcome: Progressing

## 2023-10-25 NOTE — Group Note (Signed)
 [facility] LCSW Group Therapy Note   Group Date: 10/25/2023 Start Time: 1300 End Time: 1345   Type of Therapy/Topic:  Group Therapy:  Balance in Life  Participation Level:  Did Not Attend   Description of Group:    This group will address the concept of balance and how it feels and looks when one is unbalanced. Patients will be encouraged to process areas in their lives that are out of balance, and identify reasons for remaining unbalanced. Facilitators will guide patients utilizing problem- solving interventions to address and correct the stressor making their life unbalanced. Understanding and applying boundaries will be explored and addressed for obtaining  and maintaining a balanced life. Patients will be encouraged to explore ways to assertively make their unbalanced needs known to significant others in their lives, using other group members and facilitator for support and feedback.  Therapeutic Goals: Patient will identify two or more emotions or situations they have that consume much of in their lives. Patient will identify signs/triggers that life has become out of balance:  Patient will identify two ways to set boundaries in order to achieve balance in their lives:  Patient will demonstrate ability to communicate their needs through discussion and/or role plays  Summary of Patient Progress: Patient did not attend group.    Therapeutic Modalities:   Cognitive Behavioral Therapy Solution-Focused Therapy Assertiveness Training   Nadara JONELLE Fam, LCSW

## 2023-10-25 NOTE — Progress Notes (Signed)
   10/25/23 0900  Psych Admission Type (Psych Patients Only)  Admission Status Involuntary  Psychosocial Assessment  Patient Complaints None  Eye Contact Fair  Facial Expression Sullen  Affect Sullen  Speech Logical/coherent;Soft  Interaction Forwards little;Isolative (patient isaolates to room, except for meals and medication.)  Motor Activity Slow  Appearance/Hygiene In scrubs;Improved  Behavior Characteristics Cooperative;Appropriate to situation  Mood Pleasant  Aggressive Behavior  Effect No apparent injury  Thought Process  Coherency WDL  Content WDL  Delusions None reported or observed  Perception WDL  Hallucination None reported or observed  Judgment WDL  Confusion None  Danger to Self  Current suicidal ideation? Denies  Danger to Others  Danger to Others None reported or observed   Patient's goal for today, per his self-inventory is once again to get out of here.

## 2023-10-25 NOTE — [facility] Counselor (Signed)
 CSW met with pt briefly to check-in and discuss discharge/aftercare plans. Pt endorsed plans to return home. CSW informed him that his grandmother has agreed to provide transportation home. CSW also informed him of appointment date and time with [facility] but reassured pt that this would also be on his discharge paperwork which would be reviewed with him prior to discharge. He voiced understanding. Pt shared that he is a smoker but declined cessation services. He denied any use of substances, other than marijuana, and any need for substance use treatment. Pt denied any other needs and shared that he was doing well. No other concerns expressed. Contact ended without incident.   Nadara SAUNDERS. Chaim, MSW, LCSW, LCAS 10/25/2023 3:55 PM

## 2023-10-25 NOTE — Group Note (Signed)
 Date:  10/25/2023 Time:  4:47 PM  Group Topic/Focus:  Healthy Communication:   The focus of this group is to discuss communication, barriers to communication, as well as healthy ways to communicate with others.    Participation Level:  Active  Participation Quality:  Appropriate  Affect:  Appropriate  Cognitive:  Appropriate  Insight: Appropriate  Engagement in Group:  Engaged  Modes of Intervention:  Activity  Additional Comments:    [name] Caine Barfield 10/25/2023, 4:47 PM

## 2023-10-26 ENCOUNTER — Other Ambulatory Visit: Payer: Self-pay

## 2023-10-26 NOTE — Plan of Care (Signed)

## 2023-10-26 NOTE — Progress Notes (Signed)
 Pt pleasant and happy to know that he will be discharging today.  Remains medications compliant.  Denies SI/HA, AVH, delusional and paranoia thoughts, anxiety and depression.    10/26/23 0000  Psych Admission Type (Psych Patients Only)  Admission Status Involuntary  Psychosocial Assessment  Patient Complaints None  Eye Contact Fair  Facial Expression Flat  Affect Appropriate to circumstance  Speech Logical/coherent  Interaction Minimal;Seclusive  Motor Activity Slow  Appearance/Hygiene Unremarkable  Behavior Characteristics Cooperative;Appropriate to situation  Mood Pleasant  Aggressive Behavior  Effect No apparent injury  Thought Process  Coherency WDL  Content WDL  Delusions None reported or observed  Perception WDL  Hallucination None reported or observed  Judgment WDL  Confusion None  Danger to Self  Current suicidal ideation? Denies  Danger to Others  Danger to Others None reported or observed    Problem: Education: Goal: Knowledge of [facility] Education information/materials will improve Outcome: Progressing Goal: Emotional status will improve Outcome: Progressing Goal: Mental status will improve Outcome: Progressing Goal: Verbalization of understanding the information provided will improve Outcome: Progressing   Problem: Activity: Goal: Interest or engagement in activities will improve Outcome: Progressing Goal: Sleeping patterns will improve Outcome: Progressing   Problem: Coping: Goal: Ability to verbalize frustrations and anger appropriately will improve Outcome: Progressing Goal: Ability to demonstrate self-control will improve Outcome: Progressing

## 2023-10-26 NOTE — Progress Notes (Signed)
  [facility] Adult Case Management Discharge Plan :  Will you be returning to the same living situation after discharge:  Yes,  pt plans to return home upon discharge.  At discharge, do you have transportation home?: Yes,  grandmother to provide transportation home.  Do you have the ability to pay for your medications: Yes,  [employer]  Release of information consent forms completed and in the chart;  Patient's signature needed at discharge.  Patient to Follow up at:  Follow-up Information     [facility] Follow up.   Why: Your appointment is scheduled for Wednesday, 10/31/23 at 10 AM. Contact information: [address] [place] [address] [phone]         [facility] Follow up.   Why: Referral was made for ACTT services. Contact information: [address] Camellia Rong [address] [place] [address] [phone]                 Next level of care provider has access to [facility] Link:no  Safety Planning and Suicide Prevention discussed: Yes,  SPE completed with pt.      Has patient been referred to the Quitline?: Patient refused referral for treatment  Patient has been referred for addiction treatment: Patient refused referral for treatment.  Nadara JONELLE Fam, LCSW 10/26/2023, 12:41 PM

## 2023-10-26 NOTE — Progress Notes (Signed)
   10/26/23 0923  Psych Admission Type (Psych Patients Only)  Admission Status Involuntary  Psychosocial Assessment  Patient Complaints None  Eye Contact Brief  Facial Expression Flat  Affect Flat  Speech Logical/coherent  Interaction Minimal  Motor Activity Slow  Appearance/Hygiene Unremarkable  Behavior Characteristics Cooperative  Mood Pleasant  Aggressive Behavior  Effect No apparent injury  Thought Process  Coherency WDL  Content WDL  Delusions None reported or observed  Perception WDL  Hallucination None reported or observed  Judgment WDL  Confusion None  Danger to Self  Current suicidal ideation? Denies  Danger to Others  Danger to Others None reported or observed

## 2023-10-26 NOTE — Progress Notes (Signed)
 Patient denies SI/I/AVH at this time. Discharge instructions, AVS, prescriptions, and transition record reviewed with patient and his grandmother. Patient agrees to comply with medication management, follow-up visit and outpatient therapy. Patient belongings returned to patient. Patient questions and concerns addressed and answered. Patient ambulatory off unit. Patient discharged to home with his grandmother.

## 2023-10-26 NOTE — Group Note (Signed)
 Date:  10/26/2023 Time:  10:55 AM  Group Topic/Focus:  Crisis Planning:   The purpose of this group is to help patients create a crisis plan for use upon discharge or in the future, as needed.    Participation Level:  Active  Participation Quality:  Appropriate  Affect:  Appropriate  Cognitive:  Appropriate  Insight: Appropriate  Engagement in Group:  Engaged  Modes of Intervention:  Activity  Additional Comments:    [name] Windle Huebert 10/26/2023, 10:55 AM

## 2023-10-27 NOTE — Discharge Summary (Signed)
 Physician Discharge Summary Note  Patient:  Roger [name] is an 33 y.o., male MRN:  [id number] DOB:  1991/03/13 Patient phone:  [phone] (home)  Patient address:   [address] [place] [address],    Date of Admission:  10/02/2023 Date of Discharge: 10/26/23  Reason for Admission:    The patient is a 33 year old man with chart history of bipolar vs schizoaffective disorder; hx of polysubstance abuse, depression; he presented to ED after, reportedly, his grandmother contacted crisis services due to reported concerns re: patient responding to internal stimuli, not bathing, trespassing on church property, and digging a large hole in the backyard last week with unclear intent. Grandmother has expressed concerns with patient being discharged and, per chart, she is pursuing legal guardianship for patient via APS with hope for long-term placement. Patient was admitted to [facility] unit for psychosis . On exam today patient is disengaged and internally preoccupied.  He requires frequent redirection and is easily redirectable.  He is alert and oriented to location and self.  Indicates he does not know why he is in the hospital when questioned specifically about taking the whole he indicates he wanted to take a swim and hide from the world.  He denies SI HI and AVH however he is observed responding to internal stimuli and has discussion with self during the interview.  He is a poor historian and is unable to meaningfully fully participate in the interview given his internal preoccupations.  He denied substance use UDS was positive for THC.  He denied previous mental health admissions however they are found on chart review.  He endorses some paranoia but does not elaborate.  Per chart review there is a history of prior suicide attempts.  Given current psychosis patient requires ongoing inpatient psychiatric hospitalization for medication management and stabilization due to impaired judgment, danger  to self, and danger to others.    Principal Problem: Schizophrenia [facility]) Discharge Diagnoses: Principal Problem:   Schizophrenia (HCC)  Prev Dx/Sx: bipolar vs schizoaffective disorder Current Psych Provider: none Home Meds (current): none Previous Med Trials: invega , zyprexa , risperdal , invega  sustenna Therapy: none   Prior Psych Hospitalization: multiple ed visits and admissions 10/24 1/24 12/22 5/21 Prior Self Harm: Previous SI  Prior Violence: hx of agitation and aggression   Family Psych History: UTA Family Hx suicide: UTA   Social History:  Developmental Hx: none reported   Living Situation: with grandmother Spiritual Hx: unknown Access to weapons/lethal means: none reported    Substance History Alcohol: denies non etoh on arrival   Illicit drugs: prior substance use of amphetamines, cocaine, marijuana, benzos, MDMA, and hydrocodone currently only positive for marijuana. Rehab hx: none reported Social History:  Social History   Substance and Sexual Activity  Alcohol Use Yes   Alcohol/week: 1.0 standard drink of alcohol   Types: 1 Shots of liquor per week     Social History   Substance and Sexual Activity  Drug Use Yes   Types: Amphetamines, Cocaine, Marijuana, Benzodiazepines, MDMA (Ecstacy), Hydrocodone   Comment: only weed currently    Social History   Socioeconomic History   Marital status: Single    Spouse name: Not on file   Number of children: Not on file   Years of education: Not on file   Highest education level: Not on file  Occupational History   Not on file  Tobacco Use   Smoking status: Former    Current packs/day: 0.50    Average packs/day: 0.5  packs/day for 10.0 years (5.0 ttl pk-yrs)    Types: Cigarettes   Smokeless tobacco: Former    Quit date: 11/24/2014  Vaping Use   Vaping status: Never Used  Substance and Sexual Activity   Alcohol use: Yes    Alcohol/week: 1.0 standard drink of alcohol    Types: 1 Shots of liquor per week    Drug use: Yes    Types: Amphetamines, Cocaine, Marijuana, Benzodiazepines, MDMA (Ecstacy), Hydrocodone    Comment: only weed currently   Sexual activity: Yes    Birth control/protection: None  Other Topics Concern   Not on file  Social History Narrative   Not on file   Social Drivers of Health   Financial Resource Strain: Not on file  Food Insecurity: No Food Insecurity (10/02/2023)   Hunger Vital Sign    Worried About Running Out of Food in the Last Year: Never true    Ran Out of Food in the Last Year: Never true  Transportation Needs: No Transportation Needs (10/02/2023)   PRAPARE - Administrator, Civil Service (Medical): No    Lack of Transportation (Non-Medical): No  Physical Activity: Not on file  Stress: Not on file  Social Connections: Not on file   Past Medical History:  Past Medical History:  Diagnosis Date   Depression    Headache    Substance abuse (HCC)     Past Surgical History:  Procedure Laterality Date   FEMUR FRACTURE SURGERY     Family History:  Family History  Problem Relation Age of Onset   Alcohol abuse Father     Hospital Course:   Patient was admitted to the inpatient psychiatric unit for evaluation and treatment of chronic psychotic symptoms, including disorganized thought process, internal preoccupation, impaired memory, and poor self-care. He presented with significant disorganization and required inpatient stabilization due to impaired functioning and risk of decompensation.  Patient meets DSM-5 diagnostic criteria for Schizophrenia, based on persistent disorganization, cognitive disruption, and documented historical psychotic symptoms. While no longer exhibiting overt psychosis or behavioral disturbance, the patient continues to demonstrate marked impairment compared to prior psychiatric baseline. Collateral information and chart review indicate that he was more functional in the past.  During hospitalization, the patient was  initially managed on Zyprexa , which was titrated to a maximum dose of 30 mg per day with minimal clinical benefit. Due to ongoing symptoms and limited response, a second antipsychotic, Prolixin , was added to address treatment-resistant psychosis. The combination was tolerated well and associated with modest improvement in affect and organization.  He has not exhibited psychotic agitation or acute behavioral dysregulation, and he has been attending to ADLs, following unit structure, and participating in groups with staff support. Slight improvement in affect has been observed. He verbalizes understanding of his current treatment plan and is tolerating medications well. Unfortunately, due to the persistence of cognitive and functional impairment, this may represent his new psychiatric baseline.  Given his chronic disorganization, lack of insight, and need for sustained medication adherence, referral to Assertive Community Treatment Team (ACTT) is strongly recommended for intensive outpatient psychiatric follow-up and community-based support.  While his presentation remains extremely guarded, this is felt to be consistent with the severity of his chronic mental illness. Importantly, all modifiable risks of self-harm and harm to others have been addressed in this setting. He has denied active psychotic symptoms and has not exhibited behavioral dysregulation. He has remained medication compliant throughout his stay, received a long-acting injectable (Prolixin  Decanoate 25 mg  IM), and has been linked with ACTT for assertive community-based psychiatric care.He is not presenting significantly depressed and is not manic.   The patient has agreed to outpatient mental health follow-up and is being discharged to live with his supportive grandmother. He is participating in basic self-care, with good sleep and appetite noted. Though significantly impaired, he has demonstrated the ability to follow unit structure and  attend to ADLs with support.    Currently, all modifiable risk of harm to self/harm to others have been addressed and patient is no longer appropriate for the acute inpatient setting and is able to continue treatment for mental health needs in the community with the supports as indicated below.  Patient is educated and verbalized understanding of discharge plan of care including medications, follow-up appointments, mental health resources and further crisis services in the community.  He is instructed to call 911 or present to the nearest emergency room should he experience any decompensation in mood, disturbance of mood or return of suicidal/homicidal ideations.  Patient verbalizes understanding of this education and agrees to this plan of care   Psychiatric Specialty Exam:  Presentation  General Appearance:  Appropriate for Environment  Eye Contact: Fair  Speech: Normal Rate  Speech Volume: Normal    Mood and Affect  Mood: Euthymic Brightens when engaged Affect: Flat   Thought Process  Thought Processes: Linear Able to be directed into meaningful conversation with support Descriptions of Associations:Intact  Orientation:Full (Time, Place and Person)  Thought Content:Logical; Other (comment) (continued impaired memory and delsional/illogical content)  Hallucinations:Hallucinations: None  Ideas of Reference:None  Suicidal Thoughts:Suicidal Thoughts: No  Homicidal Thoughts:Homicidal Thoughts: No   Sensorium  Memory: Remote Fair; Immediate Poor  Judgment: Fair  Insight: Fair   Art therapist  Concentration: Fair  Attention Span: Good  Recall: Good  Fund of Knowledge: Good  Language: Good   Psychomotor Activity  Psychomotor Activity:Psychomotor Activity: Normal  Musculoskeletal: Strength & Muscle Tone: within normal limits Gait & Station: normal Assets  Assets: Housing; Resilience; Physical Health; Social Support; Desire for  Improvement; Communication Skills   Sleep  Sleep:Sleep: Good    Physical Exam: Physical Exam ROS Blood pressure 111/71, pulse 67, temperature 97.8 F (36.6 C), temperature source Oral, resp. rate 16, height 5' 6 (1.676 m), weight 63 kg, SpO2 99%. Body mass index is 22.44 kg/m.   Social History   Tobacco Use  Smoking Status Former   Current packs/day: 0.50   Average packs/day: 0.5 packs/day for 10.0 years (5.0 ttl pk-yrs)   Types: Cigarettes  Smokeless Tobacco Former   Quit date: 11/24/2014   Tobacco Cessation:  N/A, patient does not currently use tobacco products   Blood Alcohol level:  Lab Results  Component Value Date   ETH <15 10/01/2023   ETH <10 01/18/2023    Metabolic Disorder Labs:  Lab Results  Component Value Date   HGBA1C 5.6 10/05/2023   MPG 114.02 10/05/2023   MPG 123 04/05/2021   Lab Results  Component Value Date   PROLACTIN 25.5 (H) 12/25/2015   Lab Results  Component Value Date   CHOL 191 10/05/2023   TRIG 193 (H) 10/05/2023   HDL 49 10/05/2023   CHOLHDL 3.9 10/05/2023   VLDL 39 10/05/2023   LDLCALC 103 (H) 10/05/2023   LDLCALC 86 04/05/2021    See Psychiatric Specialty Exam and Suicide Risk Assessment completed by Attending Physician prior to discharge.  Discharge destination:  Home  Is patient on multiple antipsychotic therapies at discharge:  Yes,  Do you recommend tapering to monotherapy for antipsychotics?  Defer to outpatient provider as symptoms difficult to stabilize, he failed trail of Zyprexa  and seems to be responding to Prolixin , needs careful outpatient follow up and intense psychiatric support   Has Patient had three or more failed trials of antipsychotic monotherapy by history:  No  Recommended Plan for Multiple Antipsychotic Therapies: Additional reason(s) for multiple antispychotic treatment:  treatment resistant, follow up and monitor for continued need for multiple antipsychotics with outpatient team  Discharge  Instructions     Diet - low sodium heart healthy   Complete by: As directed    Increase activity slowly   Complete by: As directed       Allergies as of 10/26/2023       Reactions   Haldol  [haloperidol  Lactate]    EPS sxs        Medication List     TAKE these medications      Indication  benztropine  1 MG tablet Commonly known as: COGENTIN  Take 1 tablet (1 mg total) by mouth 2 (two) times daily as needed for tremors (EPS).  Indication: Extrapyramidal Reaction caused by Medications   fluPHENAZine  10 MG tablet Commonly known as: PROLIXIN  Take 1 tablet (10 mg total) by mouth 2 (two) times daily.  Indication: Psychosis   fluPHENAZine  decanoate 25 MG/ML injection Commonly known as: PROLIXIN  Inject 1 mL (25 mg total) into the muscle every 14 (fourteen) days.  Indication: Schizophrenia   OLANZapine  10 MG tablet Commonly known as: ZYPREXA  Take 1 tablet (10 mg total) by mouth at bedtime.  Indication: Schizophrenia        Follow-up Information     [facility] Follow up.   Why: Your appointment is scheduled for Wednesday, 10/31/23 at 10 AM. Contact information: [address] [place] [address] [phone]         [facility] Follow up.   Why: Referral was made for ACTT services. Contact information: [address] [place] [address] [phone]                   Signed: Hoy CHRISTELLA Pinal, NP 10/27/2023, 12:12 PM

## 2023-12-04 ENCOUNTER — Ambulatory Visit: Payer: MEDICAID | Admitting: Physician Assistant

## 2024-01-08 ENCOUNTER — Ambulatory Visit (INDEPENDENT_AMBULATORY_CARE_PROVIDER_SITE_OTHER): Payer: MEDICAID | Admitting: Physician Assistant

## 2024-01-08 ENCOUNTER — Encounter: Payer: Self-pay | Admitting: Physician Assistant

## 2024-01-08 VITALS — BP 115/85 | HR 77 | Temp 97.8°F | Ht 66.0 in | Wt 133.0 lb

## 2024-01-08 DIAGNOSIS — Z7689 Persons encountering health services in other specified circumstances: Secondary | ICD-10-CM

## 2024-01-08 DIAGNOSIS — F201 Disorganized schizophrenia: Secondary | ICD-10-CM | POA: Diagnosis not present

## 2024-01-08 DIAGNOSIS — Z1159 Encounter for screening for other viral diseases: Secondary | ICD-10-CM

## 2024-01-08 DIAGNOSIS — Z79899 Other long term (current) drug therapy: Secondary | ICD-10-CM

## 2024-01-08 DIAGNOSIS — Z13228 Encounter for screening for other metabolic disorders: Secondary | ICD-10-CM | POA: Insufficient documentation

## 2024-01-08 DIAGNOSIS — Z0001 Encounter for general adult medical examination with abnormal findings: Secondary | ICD-10-CM | POA: Diagnosis not present

## 2024-01-08 DIAGNOSIS — F3112 Bipolar disorder, current episode manic without psychotic features, moderate: Secondary | ICD-10-CM | POA: Diagnosis not present

## 2024-01-08 DIAGNOSIS — Z136 Encounter for screening for cardiovascular disorders: Secondary | ICD-10-CM | POA: Insufficient documentation

## 2024-01-08 DIAGNOSIS — E049 Nontoxic goiter, unspecified: Secondary | ICD-10-CM

## 2024-01-08 DIAGNOSIS — F1910 Other psychoactive substance abuse, uncomplicated: Secondary | ICD-10-CM

## 2024-01-08 DIAGNOSIS — Z114 Encounter for screening for human immunodeficiency virus [HIV]: Secondary | ICD-10-CM

## 2024-01-08 DIAGNOSIS — Z1329 Encounter for screening for other suspected endocrine disorder: Secondary | ICD-10-CM | POA: Insufficient documentation

## 2024-01-08 DIAGNOSIS — Z0000 Encounter for general adult medical examination without abnormal findings: Secondary | ICD-10-CM

## 2024-01-08 NOTE — Progress Notes (Signed)
 New patient visit  Patient: Roger [name]   DOB: July 14, 1990   33 y.o. Male  MRN: [id number] Visit Date: 01/08/2024  Today's healthcare provider: Jolynn Spencer, PA-C   Chief Complaint  Patient presents with   Establish Care    Patient states he sees [facility].  Patient was noted to be shaking while sitting in the chair.  He states he didn't know what was causing it but he noticed it has been going on for a few months.  Advised that he discuss with [facility] the next time they come out to give him his shot in the event it is a side effect of any of his meds.    Annual Exam   Subjective    Roger [name] is a 33 y.o. male who presents today as a new patient to establish care.    Discussed the use of AI scribe software for clinical note transcription with the patient, who gave verbal consent to proceed.  History of Present Illness Roger [name] is a 34 year old male with schizophrenia and bipolar disorder who presents for a checkup.  He experiences a full body tremor for the past couple of months, described as a slight shake affecting his entire body. He is uncertain about taking Cogentin  for this issue.  His schizophrenia and bipolar disorder are managed with fluphenazine  injections and Zyprexa . He has been under psychiatric care for a couple of years but has not attended therapy sessions recently.  He lives with his grandmother and has maintained sobriety for a couple of years. He experiences post-nasal drainage but is not taking medication for it. He occasionally has difficulty swallowing pills and experiences foot pain when walking. He denies chest pain, shortness of breath, rapid heartbeat, headaches, or vision problems.  He is likely not up to date with vaccinations and has no plans to receive the flu or COVID vaccines this year. He denies allergies and reports feeling generally well without symptoms of fatigue or intolerance to heat or cold.    Past Medical  History:  Diagnosis Date   Anxiety    Depression    Headache    Substance abuse (HCC)    Past Surgical History:  Procedure Laterality Date   FEMUR FRACTURE SURGERY     Family Status  Relation Name Status   Mother  Alive   Father  Alive  No partnership data on file   Family History  Problem Relation Age of Onset   Cancer Father    Alcohol abuse Father    Social History   Socioeconomic History   Marital status: Single    Spouse name: Not on file   Number of children: Not on file   Years of education: Not on file   Highest education level: Not on file  Occupational History   Not on file  Tobacco Use   Smoking status: Former    Current packs/day: 0.50    Average packs/day: 0.5 packs/day for 10.0 years (5.0 ttl pk-yrs)    Types: Cigarettes   Smokeless tobacco: Former    Quit date: 11/24/2014  Vaping Use   Vaping status: Never Used  Substance and Sexual Activity   Alcohol use: Yes    Alcohol/week: 1.0 standard drink of alcohol    Types: 1 Shots of liquor per week   Drug use: Yes    Types: Amphetamines, Cocaine, Marijuana, Benzodiazepines, MDMA (Ecstacy), Hydrocodone    Comment: only weed currently   Sexual activity: Yes  Birth control/protection: None  Other Topics Concern   Not on file  Social History Narrative   Not on file   Social Drivers of Health   Financial Resource Strain: Not on file  Food Insecurity: No Food Insecurity (10/02/2023)   Hunger Vital Sign    Worried About Running Out of Food in the Last Year: Never true    Ran Out of Food in the Last Year: Never true  Transportation Needs: No Transportation Needs (10/02/2023)   PRAPARE - Administrator, Civil Service (Medical): No    Lack of Transportation (Non-Medical): No  Physical Activity: Not on file  Stress: Not on file  Social Connections: Not on file   Outpatient Medications Prior to Visit  Medication Sig   fluPHENAZine  (PROLIXIN ) 10 MG tablet Take 1 tablet (10 mg total) by  mouth 2 (two) times daily.   fluPHENAZine  decanoate (PROLIXIN ) 25 MG/ML injection Inject 1 mL (25 mg total) into the muscle every 14 (fourteen) days.   OLANZapine  (ZYPREXA ) 10 MG tablet Take 1 tablet (10 mg total) by mouth at bedtime.   benztropine  (COGENTIN ) 1 MG tablet Take 1 tablet (1 mg total) by mouth 2 (two) times daily as needed for tremors (EPS).   No facility-administered medications prior to visit.   Allergies  Allergen Reactions   Haldol  [Haloperidol  Lactate]     EPS sxs     There is no immunization history on file for this patient.  Health Maintenance  Topic Date Due   HIV Screening  Never done   Hepatitis C Screening  Never done   DTaP/Tdap/Td (1 - Tdap) Never done   Hepatitis B Vaccines 19-59 Average Risk (1 of 3 - 19+ 3-dose series) Never done   HPV VACCINES (1 - 3-dose SCDM series) Never done   COVID-19 Vaccine (1 - 2024-25 season) 01/24/2024 (Originally 12/17/2023)   Influenza Vaccine  07/15/2024 (Originally 11/16/2023)   Pneumococcal Vaccine  Aged Out   Meningococcal B Vaccine  Aged Out    Patient Care Team: Brandell Maready, PA-C as PCP - General (Physician Assistant)  Review of Systems  All other systems reviewed and are negative.  Except see HPI       Objective    BP 115/85 (BP Location: Left Arm, Patient Position: Sitting, Cuff Size: Normal)   Pulse 77   Temp 97.8 F (36.6 C) (Oral)   Ht 5' 6 (1.676 m)   Wt 133 lb (60.3 kg)   SpO2 100%   BMI 21.47 kg/m     Physical Exam Vitals reviewed.  Constitutional:      General: He is not in acute distress.    Appearance: Normal appearance. He is well-developed. He is not ill-appearing, toxic-appearing or diaphoretic.  HENT:     Head: Normocephalic and atraumatic.     Right Ear: Tympanic membrane, ear canal and external ear normal. There is impacted cerumen.     Left Ear: Tympanic membrane, ear canal and external ear normal. There is impacted cerumen.     Nose: Congestion and rhinorrhea present.      Mouth/Throat:     Mouth: Mucous membranes are moist.     Pharynx: Oropharynx is clear. No oropharyngeal exudate or posterior oropharyngeal erythema.     Comments: Postnasal drip present Eyes:     General: No scleral icterus.       Right eye: No discharge.        Left eye: No discharge.     Conjunctiva/sclera: Conjunctivae normal.  Pupils: Pupils are equal, round, and reactive to light.  Neck:     Thyroid : No thyromegaly.     Vascular: No carotid bruit.  Cardiovascular:     Rate and Rhythm: Normal rate and regular rhythm.     Pulses: Normal pulses.     Heart sounds: Normal heart sounds. No murmur heard.    No friction rub. No gallop.  Pulmonary:     Effort: Pulmonary effort is normal. No respiratory distress.     Breath sounds: Normal breath sounds. No wheezing or rales.  Abdominal:     General: Abdomen is flat. Bowel sounds are normal. There is no distension.     Palpations: Abdomen is soft. There is no mass.     Tenderness: There is no abdominal tenderness. There is no right CVA tenderness, left CVA tenderness, guarding or rebound.     Hernia: No hernia is present.  Musculoskeletal:        General: No swelling, tenderness, deformity or signs of injury. Normal range of motion.     Cervical back: Normal range of motion and neck supple. No rigidity or tenderness.     Right lower leg: No edema.     Left lower leg: No edema.  Lymphadenopathy:     Cervical: Cervical adenopathy present.  Skin:    General: Skin is warm and dry.     Coloration: Skin is not jaundiced or pale.     Findings: No bruising, erythema, lesion or rash.  Neurological:     Mental Status: He is alert and oriented to person, place, and time. Mental status is at baseline.     Gait: Gait normal.  Psychiatric:        Mood and Affect: Mood normal. Affect is flat.        Behavior: Behavior is withdrawn.        Thought Content: Thought content normal. Thought content does not include homicidal or suicidal plan.         Judgment: Judgment normal.     Depression Screen    01/08/2024    9:31 AM  PHQ 2/9 Scores  PHQ - 2 Score 2  PHQ- 9 Score 4   No results found for any visits on 01/08/24.  Assessment & Plan      Assessment & Plan Schizophrenia, Bipolar Disorder, and Major Depressive Disorder Chronic psychiatric conditions managed with fluphenazine  injection and Zyprexa . - Sign release form to obtain documentation from [facility] for comprehensive care.  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 Discussed medication side effects and need for [facility] specialist consultation. - Recommend discussing tremor with [facility] specialist for potential medication adjustment.  Foot pain Intermittent foot pain while walking.  Nasal congestion/Postnasal drip Could be due to allergic rhinitis - Use nasal saline rinses before nose sprays such as with Neilmed Sinus Rinse bottle.  Use distilled water.   - Use Flonase  2 sprays each nostril daily. Aim upward and outward. - Use Zyrtec 10 mg daily.  Chronic postnasal drainage without current treatment.  Encounter to establish care Welcomed to our clinic Reviewed past medical hx, social hx, family hx and surgical hx Pt advised to send all vaccination records or screening  Disorganized schizophrenia (HCC) (Primary) Bipolar affective disorder, currently manic, moderate (HCC) Tobacco use disorder/former Polysubstance abuse (HCC) Managed by [facility]  Goiter On physical exam -tsh and t4 Will follow-up  Need for hepatitis C screening test Low risk screening - Hepatitis C antibody  Encounter for screening for  HIV Low risk screening - HIV Antibody (routine testing w rflx)  Encounter for special screening examination for cardiovascular disorder -lp  Screening for metabolic disorder  - CBC with Differential/Platelet - Comprehensive metabolic panel with GFR - Lipid panel  Screening for thyroid  disorder  - T4,  free - TSH  High risk medications (not anticoagulants) long-term use -cmp and cbc Has been taking fluphenazine  10mg , zyprexa  10mg  and prolixin  25mg  injection every 14 days Needs to be monitored for abnormal cbc and liver enzymes  Annual physical exam Well adult visit with abnormal findings Things to do to keep yourself healthy  - Exercise at least 30-45 minutes a day, 3-4 days a week.  - Eat a low-fat diet with lots of fruits and vegetables, up to 7-9 servings per day.  - Seatbelts can save your life. Wear them always.  - Smoke detectors on every level of your home, check batteries every year.  - Eye Doctor - have an eye exam every 1-2 years  - Safe sex - if you may be exposed to STDs, use a condom.  - Alcohol -  If you drink, do it moderately, less than 2 drinks per day.  - Health Care Power of Attorney. Choose someone to speak for you if you are not able.  - Depression is common in our stressful world.If you're feeling down or losing interest in things you normally enjoy, please come in for a visit.  - Violence - If anyone is threatening or hurting you, please call immediately.  Return in about 6 weeks (around 02/19/2024) for chronic disease f/u.    The patient was advised to call back or seek an in-person evaluation if the symptoms worsen or if the condition fails to improve as anticipated.  I discussed the assessment and treatment plan with the patient. The patient was provided an opportunity to ask questions and all were answered. The patient agreed with the plan and demonstrated an understanding of the instructions.  I, Shauniece Kwan, PA-C have reviewed all documentation for this visit. The documentation on  01/08/2024   for the exam, diagnosis, procedures, and orders are all accurate and complete.  Jolynn Spencer, [facility], MMS [facility] [phone] (phone) [phone] (fax)  [facility]

## 2024-01-18 LAB — CBC WITH DIFFERENTIAL/PLATELET
Basophils Absolute: 0 x10E3/uL (ref 0.0–0.2)
Basos: 1 %
EOS (ABSOLUTE): 0.1 x10E3/uL (ref 0.0–0.4)
Eos: 2 %
Hematocrit: 46 % (ref 37.5–51.0)
Hemoglobin: 15.5 g/dL (ref 13.0–17.7)
Immature Grans (Abs): 0 x10E3/uL (ref 0.0–0.1)
Immature Granulocytes: 0 %
Lymphocytes Absolute: 2 x10E3/uL (ref 0.7–3.1)
Lymphs: 31 %
MCH: 29.8 pg (ref 26.6–33.0)
MCHC: 33.7 g/dL (ref 31.5–35.7)
MCV: 89 fL (ref 79–97)
Monocytes Absolute: 0.4 x10E3/uL (ref 0.1–0.9)
Monocytes: 6 %
Neutrophils Absolute: 3.8 x10E3/uL (ref 1.4–7.0)
Neutrophils: 60 %
Platelets: 259 x10E3/uL (ref 150–450)
RBC: 5.2 x10E6/uL (ref 4.14–5.80)
RDW: 14 % (ref 11.6–15.4)
WBC: 6.4 x10E3/uL (ref 3.4–10.8)

## 2024-01-18 LAB — TSH: TSH: 1.23 u[IU]/mL (ref 0.450–4.500)

## 2024-01-18 LAB — COMPREHENSIVE METABOLIC PANEL WITH GFR
ALT: 26 IU/L (ref 0–44)
AST: 23 IU/L (ref 0–40)
Albumin: 4.6 g/dL (ref 4.1–5.1)
Alkaline Phosphatase: 80 IU/L (ref 47–123)
BUN/Creatinine Ratio: 12 (ref 9–20)
BUN: 12 mg/dL (ref 6–20)
Bilirubin Total: 0.9 mg/dL (ref 0.0–1.2)
CO2: 22 mmol/L (ref 20–29)
Calcium: 10 mg/dL (ref 8.7–10.2)
Chloride: 100 mmol/L (ref 96–106)
Creatinine, Ser: 1.03 mg/dL (ref 0.76–1.27)
Globulin, Total: 2.4 g/dL (ref 1.5–4.5)
Glucose: 129 mg/dL — ABNORMAL HIGH (ref 70–99)
Potassium: 4.1 mmol/L (ref 3.5–5.2)
Sodium: 137 mmol/L (ref 134–144)
Total Protein: 7 g/dL (ref 6.0–8.5)
eGFR: 97 mL/min/1.73 (ref >59)

## 2024-01-18 LAB — LIPID PANEL
Chol/HDL Ratio: 3.9 ratio (ref 0.0–5.0)
Cholesterol, Total: 226 mg/dL — ABNORMAL HIGH (ref 100–199)
HDL: 58 mg/dL (ref >39)
LDL Chol Calc (NIH): 155 mg/dL — ABNORMAL HIGH (ref 0–99)
Triglycerides: 76 mg/dL (ref 0–149)
VLDL Cholesterol Cal: 13 mg/dL (ref 5–40)

## 2024-01-18 LAB — HIV ANTIBODY (ROUTINE TESTING W REFLEX): HIV Screen 4th Generation wRfx: NONREACTIVE

## 2024-01-18 LAB — T4, FREE: Free T4: 1.14 ng/dL (ref 0.82–1.77)

## 2024-01-18 LAB — HEPATITIS C ANTIBODY: Hep C Virus Ab: NONREACTIVE

## 2024-01-22 ENCOUNTER — Ambulatory Visit: Payer: Self-pay | Admitting: Physician Assistant

## 2024-01-22 NOTE — Progress Notes (Signed)
 All labs are within normal limits except Elevated cholesterol levels Advise low-cholesterol diet and regular exercise.  We will discuss in details during your follow-up

## 2024-02-19 ENCOUNTER — Encounter: Payer: Self-pay | Admitting: Physician Assistant

## 2024-02-19 ENCOUNTER — Ambulatory Visit (INDEPENDENT_AMBULATORY_CARE_PROVIDER_SITE_OTHER): Payer: MEDICAID | Admitting: Physician Assistant

## 2024-02-19 VITALS — BP 114/84 | HR 85 | Resp 14 | Ht 66.0 in | Wt 133.7 lb

## 2024-02-19 DIAGNOSIS — F1020 Alcohol dependence, uncomplicated: Secondary | ICD-10-CM

## 2024-02-19 DIAGNOSIS — E7800 Pure hypercholesterolemia, unspecified: Secondary | ICD-10-CM

## 2024-02-19 DIAGNOSIS — F1910 Other psychoactive substance abuse, uncomplicated: Secondary | ICD-10-CM

## 2024-02-19 DIAGNOSIS — F17200 Nicotine dependence, unspecified, uncomplicated: Secondary | ICD-10-CM

## 2024-02-19 DIAGNOSIS — Z79899 Other long term (current) drug therapy: Secondary | ICD-10-CM

## 2024-02-19 DIAGNOSIS — F3112 Bipolar disorder, current episode manic without psychotic features, moderate: Secondary | ICD-10-CM

## 2024-02-19 DIAGNOSIS — R251 Tremor, unspecified: Secondary | ICD-10-CM | POA: Diagnosis not present

## 2024-02-19 DIAGNOSIS — I499 Cardiac arrhythmia, unspecified: Secondary | ICD-10-CM

## 2024-02-19 DIAGNOSIS — F201 Disorganized schizophrenia: Secondary | ICD-10-CM

## 2024-02-19 NOTE — Progress Notes (Addendum)
 Established patient visit  Patient: Roger [name]   DOB: 1990-10-24   33 y.o. Male  MRN: [id number] Visit Date: 02/19/2024  Today's healthcare provider: Jolynn Spencer, PA-C   Chief Complaint  Patient presents with   Medical Management of Chronic Issues   Subjective       Discussed the use of AI scribe software for clinical note transcription with the patient, who gave verbal consent to proceed.  History of Present Illness Roger [name] is a 33 year old male who presents with tremors.  He has been experiencing tremors for an unspecified duration and is currently taking Invega  and benztropine  (Cogentin ) for management. He sees his psychiatrist monthly. He smokes ten cigarettes daily, reduced from previous levels. He no longer consumes alcohol. He denies chest pain, shortness of breath, palpitations, headaches, or blurry vision.       01/08/2024    9:31 AM  Depression screen PHQ 2/9  Decreased Interest 1  Down, Depressed, Hopeless 1  PHQ - 2 Score 2  Altered sleeping 0  Tired, decreased energy 1  Change in appetite 0  Feeling bad or failure about yourself  0  Trouble concentrating 1  Moving slowly or fidgety/restless 0  Suicidal thoughts 0  PHQ-9 Score 4  Difficult doing work/chores Somewhat difficult      01/08/2024    9:31 AM  GAD 7 : Generalized Anxiety Score  Nervous, Anxious, on Edge 0  Control/stop worrying 0  Worry too much - different things 0  Trouble relaxing 0  Restless 0  Easily annoyed or irritable 0  Afraid - awful might happen 0  Total GAD 7 Score 0  Anxiety Difficulty Not difficult at all    Medications: Outpatient Medications Prior to Visit  Medication Sig   benztropine  (COGENTIN ) 1 MG tablet Take 1 tablet (1 mg total) by mouth 2 (two) times daily as needed for tremors (EPS).   fluPHENAZine  (PROLIXIN ) 10 MG tablet Take 1 tablet (10 mg total) by mouth 2 (two) times daily.   fluPHENAZine  decanoate (PROLIXIN ) 25 MG/ML  injection Inject 1 mL (25 mg total) into the muscle every 14 (fourteen) days.   INVEGA  SUSTENNA 156 MG/ML SUSY injection Inject 156 mg into the muscle once.   OLANZapine  (ZYPREXA ) 10 MG tablet Take 1 tablet (10 mg total) by mouth at bedtime.   No facility-administered medications prior to visit.    Review of Systems All negative Except see HPI       Objective    BP 114/84   Pulse 85   Resp 14   Ht 5' 6 (1.676 m)   Wt 133 lb 11.2 oz (60.6 kg)   SpO2 100%   BMI 21.58 kg/m     Physical Exam Vitals reviewed.  Constitutional:      General: He is not in acute distress.    Appearance: Normal appearance. He is not diaphoretic.  HENT:     Head: Normocephalic and atraumatic.  Eyes:     General: No scleral icterus.    Conjunctiva/sclera: Conjunctivae normal.  Cardiovascular:     Rate and Rhythm: Normal rate and regular rhythm.     Pulses: Normal pulses.     Heart sounds: Normal heart sounds. No murmur heard. Pulmonary:     Effort: Pulmonary effort is normal. No respiratory distress.     Breath sounds: Normal breath sounds. No wheezing or rhonchi.  Musculoskeletal:     Cervical back: Neck supple.     Right lower leg:  No edema.     Left lower leg: No edema.  Lymphadenopathy:     Cervical: No cervical adenopathy.  Skin:    General: Skin is warm and dry.     Findings: No rash.  Neurological:     Mental Status: He is alert and oriented to person, place, and time. Mental status is at baseline.  Psychiatric:        Mood and Affect: Mood normal.        Behavior: Behavior normal.      No results found for any visits on 02/19/24.      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 Currently managed with benztropine . Invega  156mg   - Continue benztropine . - Ensure all medications are brought to appointments. - Obtain release form from [facility] for psychiatric records.  High risk medications (not anticoagulants) long-term use Bipolar affective  disorder, currently manic, moderate (HCC) Disorganized schizophrenia (HCC) Polysubstance abuse (HCC) Disorganized schizophrenia and bipolar disorder, current episode manic without psychotic features Managed by [facility] with monthly psychiatric visits. No recent notes available. [place] mentioned but details unclear. - Obtain release form from [facility] for psychiatric records. - Ensure all medications are brought to appointments.  Nicotine  dependence Chronic nicotine  dependence with current smoking of approximately 10 cigarettes per day. - Encouraged reduction in smoking. Will follow-up  Alcohol dependence, in remission Alcohol dependence in remission. No current alcohol use reported. Will monitor  Hypercholesterolemia Elevated cholesterol levels. Discussed lifestyle modifications and potential medication options. - Recommended low cholesterol diet and exercise. - Discussed potential initiation of medication for cholesterol management.  Irregular cardiac rhythm - EKG 12-Lead Sinus rhythm with sinus arrhythmia  Orders Placed This Encounter  Procedures   EKG 12-Lead    Return in about 6 months (around 08/18/2024) for chronic disease f/u.   The patient was advised to call back or seek an in-person evaluation if the symptoms worsen or if the condition fails to improve as anticipated.  I discussed the assessment and treatment plan with the patient. The patient was provided an opportunity to ask questions and all were answered. The patient agreed with the plan and demonstrated an understanding of the instructions.  I, Chontel Warning, PA-C have reviewed all documentation for this visit. The documentation on 02/19/2024  for the exam, diagnosis, procedures, and orders are all accurate and complete.  Jolynn Spencer, [facility], MMS [facility] [phone] (phone) [phone] (fax)  [facility]

## 2024-02-20 DIAGNOSIS — R251 Tremor, unspecified: Secondary | ICD-10-CM | POA: Insufficient documentation

## 2024-02-20 DIAGNOSIS — F1910 Other psychoactive substance abuse, uncomplicated: Secondary | ICD-10-CM | POA: Insufficient documentation

## 2024-02-20 DIAGNOSIS — E7800 Pure hypercholesterolemia, unspecified: Secondary | ICD-10-CM | POA: Insufficient documentation

## 2024-02-20 DIAGNOSIS — I499 Cardiac arrhythmia, unspecified: Secondary | ICD-10-CM | POA: Insufficient documentation

## 2024-08-18 ENCOUNTER — Ambulatory Visit: Payer: MEDICAID | Admitting: Physician Assistant
